# Patient Record
Sex: Female | Born: 1990 | Race: Black or African American | Hispanic: No | Marital: Single | Smoking: Never smoker
Health system: Southern US, Community
[De-identification: ages and names within clinical notes are randomized; demographics above are authoritative.]

## PROBLEM LIST (undated history)

## (undated) ENCOUNTER — Emergency Department (HOSPITAL_COMMUNITY): Payer: BLUE CROSS/BLUE SHIELD

## (undated) DIAGNOSIS — G43909 Migraine, unspecified, not intractable, without status migrainosus: Secondary | ICD-10-CM

## (undated) DIAGNOSIS — Z789 Other specified health status: Secondary | ICD-10-CM

## (undated) HISTORY — PX: NO PAST SURGERIES: SHX2092

## (undated) HISTORY — PX: LIPOSUCTION MULTIPLE BODY PARTS: SUR832

---
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
## 2012-08-25 ENCOUNTER — Emergency Department: Payer: Self-pay | Admitting: Emergency Medicine

## 2012-08-25 LAB — COMPREHENSIVE METABOLIC PANEL
Albumin: 4 g/dL (ref 3.4–5.0)
Alkaline Phosphatase: 75 U/L (ref 50–136)
Anion Gap: 7 (ref 7–16)
BUN: 10 mg/dL (ref 7–18)
Bilirubin,Total: 0.4 mg/dL (ref 0.2–1.0)
Calcium, Total: 9 mg/dL (ref 8.5–10.1)
Chloride: 108 mmol/L — ABNORMAL HIGH (ref 98–107)
Co2: 25 mmol/L (ref 21–32)
Creatinine: 0.8 mg/dL (ref 0.60–1.30)
EGFR (African American): >60
EGFR (Non-African Amer.): >60
Glucose: 85 mg/dL (ref 65–99)
Osmolality: 278 (ref 275–301)
Potassium: 3.4 mmol/L — ABNORMAL LOW (ref 3.5–5.1)
SGOT(AST): 19 U/L (ref 15–37)
SGPT (ALT): 16 U/L (ref 12–78)
Sodium: 140 mmol/L (ref 136–145)
Total Protein: 8 g/dL (ref 6.4–8.2)

## 2012-08-25 LAB — URINALYSIS, COMPLETE
Bacteria: NONE SEEN
Bilirubin,UR: NEGATIVE
Glucose,UR: NEGATIVE mg/dL (ref 0–75)
Ketone: NEGATIVE
Leukocyte Esterase: NEGATIVE
Nitrite: NEGATIVE
Ph: 6 (ref 4.5–8.0)
Protein: NEGATIVE
RBC,UR: 129 /HPF (ref 0–5)
Specific Gravity: 1.013 (ref 1.003–1.030)
Squamous Epithelial: NONE SEEN
WBC UR: 3 /HPF (ref 0–5)

## 2012-08-25 LAB — CBC
HCT: 37.6 % (ref 35.0–47.0)
HGB: 12.5 g/dL (ref 12.0–16.0)
MCH: 31.6 pg (ref 26.0–34.0)
MCHC: 33.4 g/dL (ref 32.0–36.0)
MCV: 95 fL (ref 80–100)
Platelet: 363 10*3/uL (ref 150–440)
RBC: 3.97 10*6/uL (ref 3.80–5.20)
RDW: 13.1 % (ref 11.5–14.5)
WBC: 6.1 10*3/uL (ref 3.6–11.0)

## 2012-08-25 LAB — LIPASE, BLOOD: Lipase: 128 U/L (ref 73–393)

## 2012-08-26 LAB — WET PREP, GENITAL

## 2012-09-23 ENCOUNTER — Emergency Department: Payer: Self-pay | Admitting: Emergency Medicine

## 2013-01-01 ENCOUNTER — Emergency Department: Payer: Self-pay | Admitting: Emergency Medicine

## 2013-04-08 ENCOUNTER — Emergency Department: Payer: Self-pay | Admitting: Internal Medicine

## 2013-05-11 ENCOUNTER — Emergency Department: Payer: Self-pay | Admitting: Internal Medicine

## 2013-05-11 LAB — URINALYSIS, COMPLETE
Bacteria: NONE SEEN
Bilirubin,UR: NEGATIVE
Glucose,UR: NEGATIVE mg/dL (ref 0–75)
Ketone: NEGATIVE
Nitrite: NEGATIVE
Ph: 6 (ref 4.5–8.0)
Protein: NEGATIVE
RBC,UR: 6 /HPF (ref 0–5)
Specific Gravity: 1.015 (ref 1.003–1.030)
Squamous Epithelial: 5
WBC UR: 21 /HPF (ref 0–5)

## 2013-05-11 LAB — COMPREHENSIVE METABOLIC PANEL
Albumin: 3.9 g/dL (ref 3.4–5.0)
Alkaline Phosphatase: 59 U/L (ref 50–136)
Anion Gap: 3 — ABNORMAL LOW (ref 7–16)
BUN: 8 mg/dL (ref 7–18)
Bilirubin,Total: 0.7 mg/dL (ref 0.2–1.0)
Calcium, Total: 8.8 mg/dL (ref 8.5–10.1)
Chloride: 111 mmol/L — ABNORMAL HIGH (ref 98–107)
Co2: 25 mmol/L (ref 21–32)
Creatinine: 0.69 mg/dL (ref 0.60–1.30)
EGFR (African American): >60
EGFR (Non-African Amer.): >60
Glucose: 89 mg/dL (ref 65–99)
Osmolality: 275 (ref 275–301)
Potassium: 3.9 mmol/L (ref 3.5–5.1)
SGOT(AST): 24 U/L (ref 15–37)
SGPT (ALT): 16 U/L (ref 12–78)
Sodium: 139 mmol/L (ref 136–145)
Total Protein: 7.5 g/dL (ref 6.4–8.2)

## 2013-05-11 LAB — CBC
HCT: 37.3 % (ref 35.0–47.0)
HGB: 13.1 g/dL (ref 12.0–16.0)
MCH: 32.2 pg (ref 26.0–34.0)
MCHC: 35.2 g/dL (ref 32.0–36.0)
MCV: 92 fL (ref 80–100)
Platelet: 311 10*3/uL (ref 150–440)
RBC: 4.08 10*6/uL (ref 3.80–5.20)
RDW: 12.5 % (ref 11.5–14.5)
WBC: 4.8 10*3/uL (ref 3.6–11.0)

## 2013-05-11 LAB — LIPASE, BLOOD: Lipase: 113 U/L (ref 73–393)

## 2013-05-19 ENCOUNTER — Emergency Department: Payer: Self-pay | Admitting: Internal Medicine

## 2013-08-14 ENCOUNTER — Encounter: Payer: Self-pay | Admitting: *Deleted

## 2013-08-29 ENCOUNTER — Ambulatory Visit (INDEPENDENT_AMBULATORY_CARE_PROVIDER_SITE_OTHER): Payer: BC Managed Care – PPO | Admitting: General Surgery

## 2013-08-29 ENCOUNTER — Encounter: Payer: Self-pay | Admitting: General Surgery

## 2013-08-29 ENCOUNTER — Other Ambulatory Visit: Payer: BC Managed Care – PPO

## 2013-08-29 VITALS — BP 94/58 | HR 72 | Resp 12 | Ht 60.0 in | Wt 114.0 lb

## 2013-08-29 DIAGNOSIS — N63 Unspecified lump in unspecified breast: Secondary | ICD-10-CM | POA: Insufficient documentation

## 2013-08-29 NOTE — Progress Notes (Signed)
Patient ID: Sabrina Brennan, female   DOB: 02/07/1991, 23 y.o.   MRN: [id number]  Chief Complaint  Patient presents with  . Other    left breast lump    HPI Sabrina Routeyasia A Danser is a 23 y.o. female here today for an evaluation of an left breast lump refer by Lyndel PleasureErica Howard NP. Denies family history of breast cancer. Her companion noted a lump about 2-3 weeks ago, states it does seem to be larger and more tender.  Wearing a bra more to help support the left breast. (She normally does not wear her bra).  No nipple discharge. No history of trauma.  HPI  History reviewed. No pertinent past medical history.  History reviewed. No pertinent past surgical history.  No family history on file.  Social History History  Substance Use Topics  . Smoking status: Never Smoker   . Smokeless tobacco: Never Used  . Alcohol Use: 1.0 oz/week    2 drink(s) per week    No Known Allergies  Current Outpatient Prescriptions  Medication Sig Dispense Refill  . medroxyPROGESTERone (DEPO-PROVERA) 150 MG/ML injection Inject 150 mg into the muscle every 3 (three) months.       No current facility-administered medications for this visit.    Review of Systems Review of Systems  Constitutional: Negative.   Respiratory: Negative.   Cardiovascular: Negative.     Blood pressure 94/58, pulse 72, resp. rate 12, height 5' (1.524 m), weight 114 lb (51.71 kg), last menstrual period 04/18/2013.  Physical Exam Physical Exam  Constitutional: She is oriented to person, place, and time. She appears well-developed and well-nourished.  Neck: Neck supple.  Cardiovascular: Normal rate, regular rhythm and normal heart sounds.   Pulmonary/Chest: Effort normal and breath sounds normal. Right breast exhibits tenderness (upper outer quadrant). Right breast exhibits no inverted nipple, no mass, no nipple discharge and no skin change. Left breast exhibits tenderness (upper outer quadrant). Left breast exhibits no inverted nipple, no  mass, no nipple discharge and no skin change.    Lymphadenopathy:    She has no cervical adenopathy.    She has no axillary adenopathy.  Neurological: She is alert and oriented to person, place, and time.  Skin: Skin is warm and dry.    Data Reviewed Ultrasound examination of the left breast was undertaken from the 12 to 3:00 position. A uniform echo pattern was noted throughout the breast, With no evidence of architectural distortion, cystic or solid lesions in the area of patient concern or adjacent tissue.  Assessment    Benign breast exam, negative ultrasound.     Plan    The patient was encouraged to make use of OTC medications for comfort as needed. She is is consuming a large volume of caffeine-containing beverages, she may desire to moderate this to see if it improves her breast discomfort. We'll plan for reassessment in 3 months, earlier if her symptoms worsen.  Tylenol/Advil as needed for comfort. Monitor amount of caffeine intake.       Earline MayotteByrnett, Serita Degroote W 08/29/2013, 7:59 PM

## 2013-08-29 NOTE — Patient Instructions (Addendum)
Continue self breast exams. Call office for any new breast issues or concerns. Tylenol/Advil as needed for comfort. Monitor amount of caffeine intake.  Breast Self-Awareness Breast self-awareness allows you to notice a breast problem early while it is still small. Do a breast self-exam:  Every month, 5 7 days after your period (menstrual period).  At the same time each month if you do not have periods anymore. Look for any:  Difference between your breasts (size, shape, or position).  Change in breast shape or size.  Fluid or blood coming from your nipples.  Changes in your nipples (dimpling, nipple movement).   Change in skin color or texture (redness, scaly areas). Feel for:  Lumps.  Bumps.  Dips.  Any other changes. HOW TO DO A BREAST SELF-EXAM Look at your breasts and nipples. 1. Take off all your clothes above your waist. 2. Stand in front of a mirror in a room with good lighting. 3. Put your hands on your hips and push your hands downward. Feel your breasts.  1. Lie flat on your back or stand in the shower or tub. If you are in the shower or tub, have wet, soapy hands. 2. Place your right arm above your head. 3. Place your left hand in the right underarm area. 4. Make small circles using the pads (not the fingertips) of your 3 middle fingers. Press lightly and then with medium and firm pressure. 5. Move your fingers a little lower and make the small circles at the 3 pressures (light, medium, and firm). 6. Continue moving your fingers lower and making circles until you reach the bottom of your breast. 7. Move your fingers one finger-width towards the center of the body. 8. Continue making the circles, this time moving upward until you reach the bottom of your neck. 9. Move your fingers one finger-width towards the center of your body. 10. Make circles downward when starting at the bottom of the neck. Make circles upward when starting at the bottom of the breast. Stop  when you reach the middle of the chest. 11.  Repeat these steps on the other breast. Write down what looks and feels normal for each breast. Also write down any changes you notice. GET HELP RIGHT AWAY IF:  You see any changes in your breasts or nipples.  You see skin changes.  You have unusual discharge from your nipples.  You feel a new lump.  You feel unusually thick areas. Document Released: 01/05/2008 Document Revised: 07/05/2012 Document Reviewed: 11/03/2011 [facility] Patient Information 2014 [employer], [place].

## 2013-11-08 ENCOUNTER — Emergency Department: Payer: Self-pay | Admitting: Emergency Medicine

## 2013-11-11 LAB — BETA STREP CULTURE(ARMC)

## 2013-11-26 ENCOUNTER — Ambulatory Visit: Payer: BC Managed Care – PPO | Admitting: General Surgery

## 2013-12-18 ENCOUNTER — Encounter: Payer: Self-pay | Admitting: *Deleted

## 2014-01-19 ENCOUNTER — Emergency Department: Payer: Self-pay | Admitting: Emergency Medicine

## 2014-01-19 LAB — CBC WITH DIFFERENTIAL/PLATELET
Basophil #: 0 10*3/uL (ref 0.0–0.1)
Basophil %: 0.5 %
Eosinophil #: 0.1 10*3/uL (ref 0.0–0.7)
Eosinophil %: 2.2 %
HCT: 39 % (ref 35.0–47.0)
HGB: 13.3 g/dL (ref 12.0–16.0)
Lymphocyte #: 2.2 10*3/uL (ref 1.0–3.6)
Lymphocyte %: 35.1 %
MCH: 32.1 pg (ref 26.0–34.0)
MCHC: 34 g/dL (ref 32.0–36.0)
MCV: 94 fL (ref 80–100)
Monocyte #: 0.4 x10 3/mm (ref 0.2–0.9)
Monocyte %: 7.1 %
Neutrophil #: 3.5 10*3/uL (ref 1.4–6.5)
Neutrophil %: 55.1 %
Platelet: 345 10*3/uL (ref 150–440)
RBC: 4.13 10*6/uL (ref 3.80–5.20)
RDW: 12.6 % (ref 11.5–14.5)
WBC: 6.3 10*3/uL (ref 3.6–11.0)

## 2014-01-19 LAB — URINALYSIS, COMPLETE
Bacteria: NONE SEEN
Bilirubin,UR: NEGATIVE
Glucose,UR: NEGATIVE mg/dL (ref 0–75)
Ketone: NEGATIVE
Nitrite: NEGATIVE
Ph: 7 (ref 4.5–8.0)
Protein: NEGATIVE
RBC,UR: 33 /HPF (ref 0–5)
Specific Gravity: 1.021 (ref 1.003–1.030)
Squamous Epithelial: NONE SEEN
WBC UR: 39 /HPF (ref 0–5)

## 2014-01-19 LAB — BASIC METABOLIC PANEL
Anion Gap: 8 (ref 7–16)
BUN: 11 mg/dL (ref 7–18)
Calcium, Total: 9.1 mg/dL (ref 8.5–10.1)
Chloride: 109 mmol/L — ABNORMAL HIGH (ref 98–107)
Co2: 23 mmol/L (ref 21–32)
Creatinine: 0.87 mg/dL (ref 0.60–1.30)
EGFR (African American): >60
EGFR (Non-African Amer.): >60
Glucose: 110 mg/dL — ABNORMAL HIGH (ref 65–99)
Osmolality: 279 (ref 275–301)
Potassium: 3.6 mmol/L (ref 3.5–5.1)
Sodium: 140 mmol/L (ref 136–145)

## 2014-03-10 ENCOUNTER — Emergency Department: Payer: Self-pay | Admitting: Emergency Medicine

## 2014-06-03 ENCOUNTER — Encounter: Payer: Self-pay | Admitting: General Surgery

## 2014-06-23 ENCOUNTER — Encounter (HOSPITAL_COMMUNITY): Payer: Self-pay | Admitting: Emergency Medicine

## 2014-06-23 ENCOUNTER — Emergency Department (HOSPITAL_COMMUNITY)
Admission: EM | Admit: 2014-06-23 | Discharge: 2014-06-23 | Disposition: A | Discharge status: Home / Self Care | Payer: 59 | Attending: Emergency Medicine | Admitting: Emergency Medicine

## 2014-06-23 DIAGNOSIS — R509 Fever, unspecified: Secondary | ICD-10-CM | POA: Insufficient documentation

## 2014-06-23 DIAGNOSIS — R0989 Other specified symptoms and signs involving the circulatory and respiratory systems: Secondary | ICD-10-CM | POA: Diagnosis not present

## 2014-06-23 DIAGNOSIS — R05 Cough: Secondary | ICD-10-CM | POA: Diagnosis present

## 2014-06-23 DIAGNOSIS — R51 Headache: Secondary | ICD-10-CM | POA: Insufficient documentation

## 2014-06-23 DIAGNOSIS — R1110 Vomiting, unspecified: Secondary | ICD-10-CM | POA: Insufficient documentation

## 2014-06-23 DIAGNOSIS — R6889 Other general symptoms and signs: Secondary | ICD-10-CM

## 2014-06-23 MED ORDER — DEXTROMETHORPHAN-GUAIFENESIN 10-100 MG/5ML PO LIQD
5.0000 mL | Freq: Four times a day (QID) | ORAL | Status: DC | PRN
Start: 2014-06-23 — End: 2015-05-08

## 2014-06-23 MED ORDER — PROMETHAZINE HCL 25 MG PO TABS
25.0000 mg | ORAL_TABLET | Freq: Four times a day (QID) | ORAL | Status: DC | PRN
Start: 2014-06-23 — End: 2015-05-08

## 2014-06-23 MED ORDER — SALINE SPRAY 0.65 % NA SOLN: 1.0000 | NASAL | Status: DC | PRN

## 2014-06-23 NOTE — ED Provider Notes (Signed)
CSN: [id number]     Arrival date & time 06/23/14  2012 History  This chart was scribed for Junius FinnerErin O'Malley, PA-C, working with Elwin MochaBlair Walden, MD by Chestine SporeSoijett Blue, ED Scribe. The patient was seen in room TR07C/TR07C at 9:06 PM.    Chief Complaint  Patient presents with  . Cough    The history is provided by the patient. No language interpreter was used.   HPI Comments: Sabrina Brennan is a 23 y.o. female who presents to the Emergency Department complaining of dry cough onset 1 week. She states that she is having associated symptoms of HA, chest congestion, fever, chills, vomiting x 2. She states that she has tried Tylenol with no relief for her symptoms. She denies nausea and any other symptoms. Denies any sick contact. Denies medical hx of asthma. Denies recent travel. Denies being allergic to any medications.   History reviewed. No pertinent past medical history. History reviewed. No pertinent past surgical history. No family history on file. History  Substance Use Topics  . Smoking status: Never Smoker   . Smokeless tobacco: Never Used  . Alcohol Use: 1.0 oz/week    2 drink(s) per week   OB History    Gravida Para Term Preterm AB TAB SAB Ectopic Multiple Living   0               Obstetric Comments   1st Menstrual Cycle:  12     Review of Systems  Constitutional: Positive for fever and chills.  Respiratory: Positive for cough.   Cardiovascular:       Chest congestion  Gastrointestinal: Positive for vomiting. Negative for nausea.  Neurological: Positive for headaches.      Allergies  Review of patient's allergies indicates no known allergies.  Home Medications   Prior to Admission medications   Medication Sig Start Date End Date Taking? Authorizing Provider  dextromethorphan-guaiFENesin (TUSSIN DM) 10-100 MG/5ML liquid Take 5 mLs by mouth every 6 (six) hours as needed for cough. 06/23/14   Junius FinnerErin O'Malley, PA-C  medroxyPROGESTERone (DEPO-PROVERA) 150 MG/ML injection Inject  150 mg into the muscle every 3 (three) months.    Historical Provider, MD  promethazine (PHENERGAN) 25 MG tablet Take 1 tablet (25 mg total) by mouth every 6 (six) hours as needed for nausea or vomiting. 06/23/14   Junius FinnerErin O'Malley, PA-C  sodium chloride (OCEAN) 0.65 % SOLN nasal spray Place 1 spray into both nostrils as needed for congestion. 06/23/14   Junius FinnerErin O'Malley, PA-C   BP 110/72 mmHg  Pulse 81  Temp(Src) 97.8 F (36.6 C)  Resp 18  Ht 4\' 11"  (1.499 m)  Wt 115 lb (52.164 kg)  BMI 23.21 kg/m2  SpO2 100% Physical Exam  Constitutional: She is oriented to person, place, and time. She appears well-developed and well-nourished. No distress.  HENT:  Head: Normocephalic and atraumatic.  Right Ear: Tympanic membrane, external ear and ear canal normal.  Left Ear: Tympanic membrane, external ear and ear canal normal.  Nose: Mucosal edema present.  Mouth/Throat: Posterior oropharyngeal erythema present. No oropharyngeal exudate or posterior oropharyngeal edema.  Eyes: EOM are normal.  Neck: Neck supple. No tracheal deviation present.  Cardiovascular: Normal rate.   Pulmonary/Chest: Effort normal and breath sounds normal. No respiratory distress.  Musculoskeletal: Normal range of motion.  Neurological: She is alert and oriented to person, place, and time.  Skin: Skin is warm and dry.  Psychiatric: She has a normal mood and affect. Her behavior is normal.  Nursing note and  vitals reviewed.   ED Course  Procedures (including critical care time) DIAGNOSTIC STUDIES: Oxygen Saturation is 100% on room air, normal by my interpretation.    COORDINATION OF CARE: 9:09 PM-Discussed treatment plan which includes nasal saline, Tussin-DM, and Phenergan with pt at bedside and pt agreed to plan.   Labs Review Labs Reviewed - No data to display  Imaging Review No results found.   EKG Interpretation None      MDM   Final diagnoses:  Flu-like symptoms    Pt presenting to ED with flu-like  symptoms. Non-toxic appearing. Afebrile. Lungs: CTAB, no respiratory distress. No CXR indicated at this time. Will tx conservatively. Rx: ocean saline nasal spray, phenergan, tussin DM. Advised pt to use acetaminophen and ibuprofen as needed for fever and pain. Encouraged rest and fluids. Work note for 3 days provided. Advised to f/u with PCP in 3-4 days if not improving. Return precautions provided. Pt verbalized understanding and agreement with tx plan.   I personally performed the services described in this documentation, which was scribed in my presence. The recorded information has been reviewed and is accurate.    Junius Finnerrin O'Malley, PA-C 06/23/14 2215  Elwin MochaBlair Walden, MD 06/23/14 [phone]

## 2014-06-23 NOTE — ED Notes (Signed)
Pt. reports persistent dry cough , chills, headache , chest congestion with low grade fever onset this week .

## 2014-08-02 ENCOUNTER — Emergency Department (HOSPITAL_COMMUNITY)
Admission: EM | Admit: 2014-08-02 | Discharge: 2014-08-02 | Disposition: A | Discharge status: Home / Self Care | Payer: 59 | Attending: Emergency Medicine | Admitting: Emergency Medicine

## 2014-08-02 ENCOUNTER — Encounter (HOSPITAL_COMMUNITY): Payer: Self-pay | Admitting: Emergency Medicine

## 2014-08-02 DIAGNOSIS — J069 Acute upper respiratory infection, unspecified: Secondary | ICD-10-CM

## 2014-08-02 DIAGNOSIS — R51 Headache: Secondary | ICD-10-CM | POA: Insufficient documentation

## 2014-08-02 MED ORDER — DEXTROMETHORPHAN POLISTIREX 30 MG/5ML PO LQCR: 30.0000 mg | ORAL | Status: DC | PRN

## 2014-08-02 MED ORDER — LORATADINE-PSEUDOEPHEDRINE ER 5-120 MG PO TB12: 1.0000 | ORAL_TABLET | Freq: Two times a day (BID) | ORAL | Status: DC
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
## 2014-08-02 NOTE — Discharge Instructions (Signed)
Take Claritin for congestion. Take delsym as needed for cough. Refer to attached documents for more information. Return to the ED with worsening or concerning symptoms.
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
## 2014-11-02 ENCOUNTER — Encounter (HOSPITAL_COMMUNITY): Payer: Self-pay | Admitting: Nurse Practitioner

## 2014-11-02 ENCOUNTER — Emergency Department (HOSPITAL_COMMUNITY)
Admission: EM | Admit: 2014-11-02 | Discharge: 2014-11-02 | Disposition: A | Discharge status: Home / Self Care | Payer: 59 | Attending: Emergency Medicine | Admitting: Emergency Medicine

## 2014-11-02 DIAGNOSIS — Z79899 Other long term (current) drug therapy: Secondary | ICD-10-CM | POA: Insufficient documentation

## 2014-11-02 DIAGNOSIS — R630 Anorexia: Secondary | ICD-10-CM | POA: Insufficient documentation

## 2014-11-02 DIAGNOSIS — M791 Myalgia: Secondary | ICD-10-CM | POA: Insufficient documentation

## 2014-11-02 DIAGNOSIS — J069 Acute upper respiratory infection, unspecified: Secondary | ICD-10-CM

## 2014-11-02 DIAGNOSIS — H53149 Visual discomfort, unspecified: Secondary | ICD-10-CM | POA: Insufficient documentation

## 2014-11-02 MED ORDER — KETOROLAC TROMETHAMINE 60 MG/2ML IM SOLN: 60.0000 mg | Freq: Once | INTRAMUSCULAR | Status: DC

## 2014-11-02 MED ORDER — KETOROLAC TROMETHAMINE 60 MG/2ML IM SOLN
60.0000 mg | Freq: Once | INTRAMUSCULAR | Status: AC
Start: 2014-11-02 — End: 2014-11-02
  Administered 2014-11-02: 60 mg via INTRAMUSCULAR
  Filled 2014-11-02: qty 2

## 2014-11-02 MED ORDER — IBUPROFEN 400 MG PO TABS: 800.0000 mg | ORAL_TABLET | Freq: Once | ORAL | Status: DC

## 2014-11-02 MED ORDER — IBUPROFEN 800 MG PO TABS: 800.0000 mg | ORAL_TABLET | Freq: Three times a day (TID) | ORAL | Status: DC | PRN

## 2014-11-02 MED ORDER — BENZONATATE 100 MG PO CAPS: 100.0000 mg | ORAL_CAPSULE | Freq: Three times a day (TID) | ORAL | Status: DC | PRN

## 2014-11-02 NOTE — ED Notes (Signed)
Pt reports L sided facial pain, congestion, headache and poor appetite this week.  She states "it feels like i have a cold." she took tylenol with no relief

## 2014-11-02 NOTE — ED Provider Notes (Signed)
CSN: [id number]     Arrival date & time 11/02/14  1127 History   This chart is scribed for non-physician practitioner, Trixie Dredge, PA-C, working with Mirian Mo, MD by Abel Presto, ED Scribe.  This patient was seen in room TR05C/TR05C and the patient's care was started 11:52 AM.     Chief Complaint  Patient presents with  . URI    Patient is a 24 y.o. female presenting with URI. The history is provided by the patient. No language interpreter was used.  URI Presenting symptoms: congestion and cough   Presenting symptoms: no fever and no sore throat   Associated symptoms: headaches and myalgias    HPI Comments: Sabrina Brennan is a 24 y.o. female who presents to the Emergency Department complaining of congestion and dry cough with onset 6 days ago. Pt notes associated loss of appetite, headache, left sided facial pain, photophobia, eye pain with touch, and chills. Pt has taken Tylenol with no relief. Pt reports difficulty sleeping. Pt with h/o injury to left side of head. Pt denies fever, sore throat, trouble swallowing, chest pain, SOB, abdominal pain, vomiting, nausea, diarrhea, and vaginal discharge.   History reviewed. No pertinent past medical history. History reviewed. No pertinent past surgical history. History reviewed. No pertinent family history. History  Substance Use Topics  . Smoking status: Never Smoker   . Smokeless tobacco: Never Used  . Alcohol Use: 1.0 oz/week    2 drink(s) per week   OB History    Gravida Para Term Preterm AB TAB SAB Ectopic Multiple Living   0               Obstetric Comments   1st Menstrual Cycle:  12     Review of Systems  Constitutional: Positive for chills and appetite change. Negative for fever.  HENT: Positive for congestion. Negative for sore throat and trouble swallowing.   Eyes: Positive for photophobia and pain.  Respiratory: Positive for cough. Negative for shortness of breath.   Gastrointestinal: Negative for nausea,  vomiting, abdominal pain and diarrhea.  Genitourinary: Negative for vaginal discharge.  Musculoskeletal: Positive for myalgias.  Neurological: Positive for headaches.  All other systems reviewed and are negative.     Allergies  Review of patient's allergies indicates no known allergies.  Home Medications   Prior to Admission medications   Medication Sig Start Date End Date Taking? Authorizing Provider  dextromethorphan (DELSYM) 30 MG/5ML liquid Take 5 mLs (30 mg total) by mouth as needed for cough. 08/02/14   Kaitlyn Szekalski, PA-C  dextromethorphan-guaiFENesin (TUSSIN DM) 10-100 MG/5ML liquid Take 5 mLs by mouth every 6 (six) hours as needed for cough. 06/23/14   Junius Finner, PA-C  loratadine-pseudoephedrine (CLARITIN-D 12 HOUR) 5-120 MG per tablet Take 1 tablet by mouth 2 (two) times daily. 08/02/14   Kaitlyn Szekalski, PA-C  medroxyPROGESTERone (DEPO-PROVERA) 150 MG/ML injection Inject 150 mg into the muscle every 3 (three) months.    Historical Provider, MD  promethazine (PHENERGAN) 25 MG tablet Take 1 tablet (25 mg total) by mouth every 6 (six) hours as needed for nausea or vomiting. 06/23/14   Junius Finner, PA-C  sodium chloride (OCEAN) 0.65 % SOLN nasal spray Place 1 spray into both nostrils as needed for congestion. 06/23/14   Junius Finner, PA-C   BP 102/71 mmHg  Pulse 70  Temp(Src) 98 F (36.7 C) (Oral)  Resp 16  Ht  (1.499 m)  Wt 115 lb (52.164 kg)  BMI 23.21 kg/m2  SpO2  100% Physical Exam  Constitutional: She appears well-developed and well-nourished. No distress.  HENT:  Head: Atraumatic. Macrocephalic.  Nose: No mucosal edema. Right sinus exhibits no maxillary sinus tenderness and no frontal sinus tenderness. Left sinus exhibits maxillary sinus tenderness and frontal sinus tenderness.  Mouth/Throat: Posterior oropharyngeal erythema present. No oropharyngeal exudate or posterior oropharyngeal edema.  Eyes: Conjunctivae and EOM are normal. Right eye exhibits no  discharge. Left eye exhibits no discharge.  Neck: Normal range of motion. Neck supple. Normal range of motion (full active) present.  Cardiovascular: Normal rate and regular rhythm.   Pulmonary/Chest: Effort normal and breath sounds normal. No stridor. No respiratory distress. She has no wheezes. She has no rales.  Lymphadenopathy:    She has no cervical adenopathy.  Neurological: She is alert.  Skin: She is not diaphoretic.  Nursing note and vitals reviewed.   ED Course  Procedures (including critical care time) DIAGNOSTIC STUDIES: Oxygen Saturation is 100% on room air, normal by my interpretation.    COORDINATION OF CARE: 11:55 AM Discussed treatment plan with patient at beside, the patient agrees with the plan and has no further questions at this time.   Labs Review Labs Reviewed - No data to display  Imaging Review No results found.   EKG Interpretation None      MDM   Final diagnoses:  URI (upper respiratory infection)    Afebrile, nontoxic patient with constellation of symptoms suggestive of viral syndrome.  No concerning findings on exam.  No meningeal signs.  Discharged home with supportive care, PCP follow up.  Discussed result, findings, treatment, and follow up  with patient.  Pt given return precautions.  Pt verbalizes understanding and agrees with plan.      I personally performed the services described in this documentation, which was scribed in my presence. The recorded information has been reviewed and is accurate.    Trixie Dredgemily Treyvonne Tata, PA-C 11/02/14 1243  Mirian MoMatthew Gentry, MD 11/05/14 [phone]

## 2014-11-02 NOTE — Discharge Instructions (Signed)
Read the information below.  Use the prescribed medication as directed.  Please discuss all new medications with your pharmacist.  You may return to the Emergency Department at any time for worsening condition or any new symptoms that concern you.  If you develop high fevers that do not resolve with tylenol or ibuprofen, you have difficulty swallowing or breathing, or you are unable to tolerate fluids by mouth, return to the ER for a recheck.   ° ° °Upper Respiratory Infection, Adult °An upper respiratory infection (URI) is also known as the common cold. It is often caused by a type of germ (virus). Colds are easily spread (contagious). You can pass it to others by kissing, coughing, sneezing, or drinking out of the same glass. Usually, you get better in 1 or 2 weeks.  °HOME CARE  °· Only take medicine as told by your doctor. °· Use a warm mist humidifier or breathe in steam from a hot shower. °· Drink enough water and fluids to keep your pee (urine) clear or pale yellow. °· Get plenty of rest. °· Return to work when your temperature is back to normal or as told by your doctor. You may use a face mask and wash your hands to stop your cold from spreading. °GET HELP RIGHT AWAY IF:  °· After the first few days, you feel you are getting worse. °· You have questions about your medicine. °· You have chills, shortness of breath, or brown or red spit (mucus). °· You have yellow or brown snot (nasal discharge) or pain in the face, especially when you bend forward. °· You have a fever, puffy (swollen) neck, pain when you swallow, or white spots in the back of your throat. °· You have a bad headache, ear pain, sinus pain, or chest pain. °· You have a high-pitched whistling sound when you breathe in and out (wheezing). °· You have a lasting cough or cough up blood. °· You have sore muscles or a stiff neck. °MAKE SURE YOU:  °· Understand these instructions. °· Will watch your condition. °· Will get help right away if you are not  doing well or get worse. °Document Released: 01/05/2008 Document Revised: 10/11/2011 Document Reviewed: 10/24/2013 °ExitCare® Patient Information ©2015 [employer]. This information is not intended to replace advice given to you by your health care provider. Make sure you discuss any questions you have with your health care provider. ° °

## 2014-12-09 ENCOUNTER — Encounter (HOSPITAL_COMMUNITY): Payer: Self-pay | Admitting: Emergency Medicine

## 2014-12-09 ENCOUNTER — Emergency Department (HOSPITAL_COMMUNITY)
Admission: EM | Admit: 2014-12-09 | Discharge: 2014-12-09 | Disposition: A | Discharge status: Home / Self Care | Payer: 59 | Attending: Emergency Medicine | Admitting: Emergency Medicine

## 2014-12-09 ENCOUNTER — Emergency Department (HOSPITAL_COMMUNITY): Payer: 59

## 2014-12-09 DIAGNOSIS — R079 Chest pain, unspecified: Secondary | ICD-10-CM

## 2014-12-09 DIAGNOSIS — R200 Anesthesia of skin: Secondary | ICD-10-CM | POA: Insufficient documentation

## 2014-12-09 DIAGNOSIS — Z79899 Other long term (current) drug therapy: Secondary | ICD-10-CM | POA: Insufficient documentation

## 2014-12-09 LAB — CBC WITH DIFFERENTIAL/PLATELET
Basophils Absolute: 0 10*3/uL (ref 0.0–0.1)
Basophils Relative: 0 % (ref 0–1)
Eosinophils Absolute: 0.1 10*3/uL (ref 0.0–0.7)
Eosinophils Relative: 2 % (ref 0–5)
HCT: 38.4 % (ref 36.0–46.0)
Hemoglobin: 13 g/dL (ref 12.0–15.0)
Lymphocytes Relative: 47 % — ABNORMAL HIGH (ref 12–46)
Lymphs Abs: 3.5 10*3/uL (ref 0.7–4.0)
MCH: 31.1 pg (ref 26.0–34.0)
MCHC: 33.9 g/dL (ref 30.0–36.0)
MCV: 91.9 fL (ref 78.0–100.0)
Monocytes Absolute: 0.4 10*3/uL (ref 0.1–1.0)
Monocytes Relative: 5 % (ref 3–12)
Neutro Abs: 3.4 10*3/uL (ref 1.7–7.7)
Neutrophils Relative %: 46 % (ref 43–77)
Platelets: 326 10*3/uL (ref 150–400)
RBC: 4.18 MIL/uL (ref 3.87–5.11)
RDW: 12.4 % (ref 11.5–15.5)
WBC: 7.4 10*3/uL (ref 4.0–10.5)

## 2014-12-09 LAB — I-STAT CHEM 8, ED
BUN: 12 mg/dL (ref 6–20)
Calcium, Ion: 1.23 mmol/L (ref 1.12–1.23)
Chloride: 106 mmol/L (ref 101–111)
Creatinine, Ser: 0.8 mg/dL (ref 0.44–1.00)
Glucose, Bld: 90 mg/dL (ref 70–99)
HCT: 42 % (ref 36.0–46.0)
Hemoglobin: 14.3 g/dL (ref 12.0–15.0)
Potassium: 3.4 mmol/L — ABNORMAL LOW (ref 3.5–5.1)
Sodium: 142 mmol/L (ref 135–145)
TCO2: 19 mmol/L (ref 0–100)

## 2014-12-09 MED ORDER — POTASSIUM CHLORIDE CRYS ER 20 MEQ PO TBCR
60.0000 meq | EXTENDED_RELEASE_TABLET | Freq: Once | ORAL | Status: AC
  Administered 2014-12-09: 60 meq via ORAL
  Filled 2014-12-09: qty 3

## 2014-12-09 MED ORDER — HYDROCODONE-ACETAMINOPHEN 5-325 MG PO TABS
1.0000 | ORAL_TABLET | Freq: Once | ORAL | Status: AC
  Administered 2014-12-09: 1 via ORAL
  Filled 2014-12-09: qty 1

## 2014-12-09 MED ORDER — IBUPROFEN 800 MG PO TABS
800.0000 mg | ORAL_TABLET | Freq: Once | ORAL | Status: AC
  Administered 2014-12-09: 800 mg via ORAL
  Filled 2014-12-09: qty 1

## 2014-12-09 NOTE — ED Provider Notes (Signed)
CSN: [id number]     Arrival date & time 12/09/14  [id number] History   First MD Initiated Contact with Patient 12/09/14 0336    This chart was scribed for Tomasita CrumbleAdeleke Aiesha Leland, MD by Marica OtterNusrat Rahman, ED Scribe. This patient was seen in room A01C/A01C and the patient's care was started at 3:39 AM.  Chief Complaint  Patient presents with  . Chest Pain  . Numbness   The history is provided by the patient. No language interpreter was used.   PCP: No PCP per Pt HPI Comments: Sabrina Brennan is a 24 y.o. female who presents to the Emergency Department complaining of atraumatic radiating left elbow pain and tingling radiating to the fingers with associated intermittent, left sided, aching chest pain onset three weeks ago. Pt reports using Motrin at home with some relief. Pt also notes cold like Sx recently. Pt denies any recent surgeries, BC pills (pt notes her last depo shot was 1 month ago), hormone pills, Hx of blood clots, vomiting, diaphoresis.   History reviewed. No pertinent past medical history. History reviewed. No pertinent past surgical history. History reviewed. No pertinent family history. History  Substance Use Topics  . Smoking status: Never Smoker   . Smokeless tobacco: Never Used  . Alcohol Use: 1.0 oz/week    2 drink(s) per week   OB History    Gravida Para Term Preterm AB TAB SAB Ectopic Multiple Living   0               Obstetric Comments   1st Menstrual Cycle:  12     Review of Systems  10 Systems reviewed and all are negative for acute change except as noted in the HPI.  Allergies  Review of patient's allergies indicates no known allergies.  Home Medications   Prior to Admission medications   Medication Sig Start Date End Date Taking? Authorizing Provider  benzonatate (TESSALON) 100 MG capsule Take 1 capsule (100 mg total) by mouth 3 (three) times daily as needed for cough. 11/02/14   Trixie DredgeEmily West, PA-C  dextromethorphan (DELSYM) 30 MG/5ML liquid Take 5 mLs (30 mg total) by mouth  as needed for cough. 08/02/14   Kaitlyn Szekalski, PA-C  dextromethorphan-guaiFENesin (TUSSIN DM) 10-100 MG/5ML liquid Take 5 mLs by mouth every 6 (six) hours as needed for cough. 06/23/14   Junius FinnerErin O'Malley, PA-C  ibuprofen (ADVIL,MOTRIN) 800 MG tablet Take 1 tablet (800 mg total) by mouth every 8 (eight) hours as needed for mild pain or moderate pain. 11/02/14   Trixie DredgeEmily West, PA-C  loratadine-pseudoephedrine (CLARITIN-D 12 HOUR) 5-120 MG per tablet Take 1 tablet by mouth 2 (two) times daily. 08/02/14   Kaitlyn Szekalski, PA-C  medroxyPROGESTERone (DEPO-PROVERA) 150 MG/ML injection Inject 150 mg into the muscle every 3 (three) months.    Historical Provider, MD  promethazine (PHENERGAN) 25 MG tablet Take 1 tablet (25 mg total) by mouth every 6 (six) hours as needed for nausea or vomiting. 06/23/14   Junius FinnerErin O'Malley, PA-C  sodium chloride (OCEAN) 0.65 % SOLN nasal spray Place 1 spray into both nostrils as needed for congestion. 06/23/14   Junius FinnerErin O'Malley, PA-C   There were no vitals taken for this visit. Physical Exam  Constitutional: She is oriented to person, place, and time. She appears well-developed and well-nourished. No distress.  HENT:  Head: Normocephalic and atraumatic.  Nose: Nose normal.  Mouth/Throat: Oropharynx is clear and moist. No oropharyngeal exudate.  Eyes: Conjunctivae and EOM are normal. Pupils are equal, round, and reactive to  light. No scleral icterus.  Neck: Normal range of motion. Neck supple. No JVD present. No tracheal deviation present. No thyromegaly present.  Cardiovascular: Normal rate, regular rhythm and normal heart sounds.  Exam reveals no gallop and no friction rub.   No murmur heard. Pulmonary/Chest: Effort normal and breath sounds normal. No respiratory distress. She has no wheezes. She exhibits no tenderness.  Abdominal: Soft. Bowel sounds are normal. She exhibits no distension and no mass. There is no tenderness. There is no rebound and no guarding.  Musculoskeletal:  Normal range of motion. She exhibits no edema or tenderness.  Lymphadenopathy:    She has no cervical adenopathy.  Neurological: She is alert and oriented to person, place, and time. No cranial nerve deficit. She exhibits normal muscle tone.  Skin: Skin is warm and dry. No rash noted. No erythema. No pallor.  Nursing note and vitals reviewed.   ED Course  Procedures (including critical care time) DIAGNOSTIC STUDIES: Oxygen Saturation is 99% on room air which is normal by my interpretation.   COORDINATION OF CARE: 3:43 AM-Discussed treatment plan which includes meds, imaging and PCP referral, with pt at bedside and pt agreed to plan.   Labs Review Labs Reviewed  CBC WITH DIFFERENTIAL/PLATELET - Abnormal; Notable for the following:    Lymphocytes Relative 47 (*)    All other components within normal limits  I-STAT CHEM 8, ED - Abnormal; Notable for the following:    Potassium 3.4 (*)    All other components within normal limits    Imaging Review Dg Chest 2 View  12/09/2014   CLINICAL DATA:  Left-sided chest pain extending down the left arm. Cough and laryngitis.  EXAM: CHEST  2 VIEW  COMPARISON:  None.  FINDINGS: The heart size and mediastinal contours are within normal limits. Both lungs are clear. The visualized skeletal structures are unremarkable. Metallic piercings over the nipples.  IMPRESSION: No active cardiopulmonary disease.   Electronically Signed   By: Burman NievesWilliam  Stevens M.D.   On: 12/09/2014 04:12     EKG Interpretation   Date/Time:  Monday Dec 09 2014 03:45:30 EDT Ventricular Rate:  76 PR Interval:  139 QRS Duration: 79 QT Interval:  371 QTC Calculation: 417 R Axis:   60 Text Interpretation:  Sinus arrhythmia Artifact in lead(s) V6 and baseline  wander in lead(s) V6 Confirmed by Erroll Lunani, Dasiah Hooley Ayokunle [phone]([id number]) on  12/09/2014 4:24:51 AM      MDM   Final diagnoses:  Chest pain   Patient presents emergency department for chest pain and left elbow pain that  radiates to her left hand. The pain in her upper extremities described more as a weakness and tingling. I have low concern for an emergent diagnosis in this patient. She is PERC negative. ACS is not consistent with this patient's history. She denies any trauma. Patient was given Motrin and Norco for pain relief in the emergency department. Potassium replacement was given as well. Her emergency department workup is negative including laboratory studies, chest x-ray, EKG for cause of her symptoms. Patient was strongly advised to follow-up with primary care or neurology for continued management. Return precautions given. Patient overall appears well and pain is improved after medications. Her vital signs remain within her normal limits and she is safe for discharge.   I personally performed the services described in this documentation, which was scribed in my presence. The recorded information has been reviewed and is accurate.   Tomasita CrumbleAdeleke Louana Fontenot, MD 12/09/14 [id number]

## 2014-12-09 NOTE — ED Notes (Signed)
Pt states that she has had pain in her left elbow that radiates down to her hand that cause her fingers to be numb. Pt also reports pain to her left chest that is worse with palpation and inspiration.  Pt also reports having a runny nose, cough and congestions x 1 week.

## 2014-12-09 NOTE — Discharge Instructions (Signed)
Chest Pain (Nonspecific) Sabrina Brennan, continue to take Tylenol or Motrin as needed for pain. See a primary care physician within 3 days for follow-up. If symptoms worsen come back to emergency department immediately. Thank you.  It is often hard to give a diagnosis for the cause of chest pain. There is always a chance that your pain could be related to something serious, such as a heart attack or a blood clot in the lungs. You need to follow up with your doctor. HOME CARE  If antibiotic medicine was given, take it as directed by your doctor. Finish the medicine even if you start to feel better.  For the next few days, avoid activities that bring on chest pain. Continue physical activities as told by your doctor.  Do not use any tobacco products. This includes cigarettes, chewing tobacco, and e-cigarettes.  Avoid drinking alcohol.  Only take medicine as told by your doctor.  Follow your doctor's suggestions for more testing if your chest pain does not go away.  Keep all doctor visits you made. GET HELP IF:  Your chest pain does not go away, even after treatment.  You have a rash with blisters on your chest.  You have a fever. GET HELP RIGHT AWAY IF:   You have more pain or pain that spreads to your arm, neck, jaw, back, or belly (abdomen).  You have shortness of breath.  You cough more than usual or cough up blood.  You have very bad back or belly pain.  You feel sick to your stomach (nauseous) or throw up (vomit).  You have very bad weakness.  You pass out (faint).  You have chills. This is an emergency. Do not wait to see if the problems will go away. Call your local emergency services (911 in [place] Do not drive yourself to the hospital. MAKE SURE YOU:   Understand these instructions.  Will watch your condition.  Will get help right away if you are not doing well or get worse. Document Released: 01/05/2008 Document Revised: 07/24/2013 Document Reviewed:  01/05/2008 [facility] CenterExit[facility] Patient Information 2015 [place]ExitCare, [place]LLC. This information is not intended to replace advice given to you by your health care provider. Make sure you discuss any questions you have with your health care provider.

## 2014-12-22 ENCOUNTER — Emergency Department (HOSPITAL_COMMUNITY)
Admission: EM | Admit: 2014-12-22 | Discharge: 2014-12-22 | Disposition: A | Discharge status: Home / Self Care | Payer: 59 | Attending: Emergency Medicine | Admitting: Emergency Medicine

## 2014-12-22 ENCOUNTER — Encounter (HOSPITAL_COMMUNITY): Payer: Self-pay | Admitting: Physical Medicine and Rehabilitation

## 2014-12-22 DIAGNOSIS — Z79899 Other long term (current) drug therapy: Secondary | ICD-10-CM | POA: Insufficient documentation

## 2014-12-22 DIAGNOSIS — J020 Streptococcal pharyngitis: Secondary | ICD-10-CM | POA: Insufficient documentation

## 2014-12-22 DIAGNOSIS — H9201 Otalgia, right ear: Secondary | ICD-10-CM | POA: Insufficient documentation

## 2014-12-22 DIAGNOSIS — Z793 Long term (current) use of hormonal contraceptives: Secondary | ICD-10-CM | POA: Insufficient documentation

## 2014-12-22 LAB — RAPID STREP SCREEN (MED CTR MEBANE ONLY): Streptococcus, Group A Screen (Direct): POSITIVE — AB

## 2014-12-22 MED ORDER — PHENOL 1.4 % MT LIQD
1.0000 | OROMUCOSAL | Status: DC | PRN
  Administered 2014-12-22: 1 via OROMUCOSAL
  Filled 2014-12-22: qty 177

## 2014-12-22 MED ORDER — PENICILLIN G BENZATHINE 1200000 UNIT/2ML IM SUSP
1.2000 10*6.[IU] | Freq: Once | INTRAMUSCULAR | Status: AC
  Administered 2014-12-22: 1.2 10*6.[IU] via INTRAMUSCULAR
  Filled 2014-12-22: qty 2

## 2014-12-22 NOTE — ED Notes (Signed)
Pt observed eating [employer]. Pt is tolerating food well.

## 2014-12-22 NOTE — Discharge Instructions (Signed)
Continue to stay well-hydrated. Gargle warm salt water and spit it out, use chloraseptic spray as needed for sore throat. Continue to alternate between Tylenol and Ibuprofen for pain or fever. Followup with your primary care doctor in 5-7 days for recheck of ongoing symptoms. Return to emergency department for emergent changing or worsening of symptoms.   Strep Throat Strep throat is an infection of the throat. It is caused by a germ. Strep throat spreads from person to person by coughing, sneezing, or close contact. HOME CARE  Rinse your mouth (gargle) with warm salt water (1 teaspoon salt in 1 cup of water). Do this 3 to 4 times per day or as needed for comfort.  Family members with a sore throat or fever should see a doctor.  Make sure everyone in your house washes their hands well.  Do not share food, drinking cups, or personal items.  Eat soft foods until your sore throat gets better.  Drink enough water and fluids to keep your pee (urine) clear or pale yellow.  Rest.  Stay home from school, daycare, or work until you have taken medicine for 24 hours.  Only take medicine as told by your doctor.  Take your medicine as told. Finish it even if you start to feel better. GET HELP RIGHT AWAY IF:   You have new problems, such as throwing up (vomiting) or bad headaches.  You have a stiff or painful neck, chest pain, trouble breathing, or trouble swallowing.  You have very bad throat pain, drooling, or changes in your voice.  Your neck puffs up (swells) or gets red and tender.  You have a fever.  You are very tired, your mouth is dry, or you are peeing less than normal.  You cannot wake up completely.  You get a rash, cough, or earache.  You have green, yellow-brown, or bloody spit.  Your pain does not get better with medicine. MAKE SURE YOU:   Understand these instructions.  Will watch your condition.  Will get help right away if you are not doing well or get  worse. Document Released: 01/05/2008 Document Revised: 10/11/2011 Document Reviewed: 09/17/2010 [facility] LLCExitCare Patient Information 2015 [place]ExitCare, [place]LLC. This information is not intended to replace advice given to you by your health care provider. Make sure you discuss any questions you have with your health care provider.  Salt Water Gargle This solution will help make your mouth and throat feel better. HOME CARE INSTRUCTIONS   Mix 1 teaspoon of salt in 8 ounces of warm water.  Gargle with this solution as much or often as you need or as directed. Swish and gargle gently if you have any sores or wounds in your mouth.  Do not swallow this mixture. Document Released: 04/22/2004 Document Revised: 10/11/2011 Document Reviewed: 09/13/2008 [facility] CenterExitCare Patient Information 2015 [place]erExitCare, [place]LLC. This information is not intended to replace advice given to you by your health care provider. Make sure you discuss any questions you have with your health care provider.

## 2014-12-22 NOTE — ED Notes (Signed)
Onset 2 days sore throat, pt states "feels like throat is closing"; right ear pain- no ear drainage.  No c/o fever, cough, runny nose, nasal congestion.  Last dose of Tylenol yesterday evening.  No respiratory or swallowing difficulties.

## 2014-12-22 NOTE — ED Provider Notes (Signed)
CSN: [id number]     Arrival date & time 12/22/14  1627 History   This chart was scribed for non-physician practitioner working, [employer], PA-C, with Glynn Octave, MD, by Modena Jansky, ED Scribe. This patient was seen in room TR05C/TR05C and the patient's care was started at 5:39 PM.   Chief Complaint  Patient presents with  . Sore Throat  . Otalgia   Patient is a 24 y.o. female presenting with pharyngitis and ear pain. The history is provided by the patient. No language interpreter was used.  Sore Throat This is a new problem. The current episode started 2 days ago. The problem occurs rarely. The problem has not changed since onset.Pertinent negatives include no chest pain, no abdominal pain and no shortness of breath. The symptoms are aggravated by swallowing. Nothing relieves the symptoms. She has tried acetaminophen for the symptoms. The treatment provided no relief.  Otalgia Location:  Right Severity:  Moderate Duration:  3 days Timing:  Constant Progression:  Unchanged Chronicity:  New Relieved by:  Nothing Worsened by:  Nothing tried Ineffective treatments:  OTC medications Associated symptoms: sore throat   Associated symptoms: no abdominal pain, no cough, no diarrhea, no ear discharge, no fever, no rash, no rhinorrhea and no vomiting    HPI Comments: FANTASHIA SHUPERT is a 24 y.o. healthy female who presents to the Emergency Department complaining of constant moderate 8/10 sore throat and nonradiating, that started two days ago. She states that she has been having a sore throat, chills, and right ear pain. She reports that swallowing exacerbates the sore throat, but she can still swallow. She states that she has been taking ibuprofen and tylenol without any relief. She denies any sick contacts or recent travel. She also denies any fever, trismus, drooling, difficulty swallowing, eye pain or drainage, chest pain, SOB, cough, ear discharge, abdominal pain, nausea, or  vomiting, numbness, tingling, weakness, or rashes.   History reviewed. No pertinent past medical history. History reviewed. No pertinent past surgical history. No family history on file. History  Substance Use Topics  . Smoking status: Never Smoker   . Smokeless tobacco: Never Used  . Alcohol Use: 1.0 oz/week    2 drink(s) per week   OB History    Gravida Para Term Preterm AB TAB SAB Ectopic Multiple Living   0               Obstetric Comments   1st Menstrual Cycle:  12     Review of Systems  Constitutional: Positive for chills. Negative for fever.  HENT: Positive for ear pain (right) and sore throat. Negative for drooling, ear discharge, rhinorrhea and trouble swallowing.   Eyes: Negative for pain and itching.  Respiratory: Negative for cough and shortness of breath.   Cardiovascular: Negative for chest pain.  Gastrointestinal: Negative for nausea, vomiting, abdominal pain and diarrhea.  Musculoskeletal: Negative for myalgias and arthralgias.  Skin: Negative for rash.  Allergic/Immunologic: Negative for immunocompromised state.  Neurological: Negative for weakness and numbness.  10 Systems reviewed and all are negative for acute change except as noted in the HPI.  Allergies  Review of patient's allergies indicates no known allergies.  Home Medications   Prior to Admission medications   Medication Sig Start Date End Date Taking? Authorizing Provider  benzonatate (TESSALON) 100 MG capsule Take 1 capsule (100 mg total) by mouth 3 (three) times daily as needed for cough. 11/02/14   Trixie Dredge, PA-C  dextromethorphan (DELSYM) 30 MG/5ML liquid Take  5 mLs (30 mg total) by mouth as needed for cough. 08/02/14   Kaitlyn Szekalski, PA-C  dextromethorphan-guaiFENesin (TUSSIN DM) 10-100 MG/5ML liquid Take 5 mLs by mouth every 6 (six) hours as needed for cough. 06/23/14   Junius FinnerErin O'Malley, PA-C  ibuprofen (ADVIL,MOTRIN) 800 MG tablet Take 1 tablet (800 mg total) by mouth every 8 (eight) hours  as needed for mild pain or moderate pain. 11/02/14   Trixie DredgeEmily West, PA-C  loratadine-pseudoephedrine (CLARITIN-D 12 HOUR) 5-120 MG per tablet Take 1 tablet by mouth 2 (two) times daily. 08/02/14   Kaitlyn Szekalski, PA-C  medroxyPROGESTERone (DEPO-PROVERA) 150 MG/ML injection Inject 150 mg into the muscle every 3 (three) months.    Historical Provider, MD  promethazine (PHENERGAN) 25 MG tablet Take 1 tablet (25 mg total) by mouth every 6 (six) hours as needed for nausea or vomiting. 06/23/14   Junius FinnerErin O'Malley, PA-C  sodium chloride (OCEAN) 0.65 % SOLN nasal spray Place 1 spray into both nostrils as needed for congestion. 06/23/14   Junius FinnerErin O'Malley, PA-C   BP 100/74 mmHg  Pulse 75  Temp(Src) 98.8 F (37.1 C) (Oral)  Resp 18  Ht 4\' 11"  (1.499 m)  Wt 118 lb 4.8 oz (53.661 kg)  BMI 23.88 kg/m2  SpO2 100% Physical Exam  Constitutional: She is oriented to person, place, and time. Vital signs are normal. She appears well-developed and well-nourished.  Non-toxic appearance. No distress.  Afebrile, nontoxic, NAD  HENT:  Head: Normocephalic and atraumatic.  Right Ear: Hearing, tympanic membrane, external ear and ear canal normal.  Left Ear: Hearing, tympanic membrane, external ear and ear canal normal.  Nose: Nose normal.  Mouth/Throat: Uvula is midline and mucous membranes are normal. No trismus in the jaw. No uvula swelling. Oropharyngeal exudate, posterior oropharyngeal edema and posterior oropharyngeal erythema present. No tonsillar abscesses.  Ears are clear bilaterally. Nose clear. Oropharynx with 1+ tonsillar swelling and exudates bilaterally, erythematous, without uvular swelling or deviation, no trismus or drooling. Eating in exam room.  Eyes: Conjunctivae and EOM are normal. Right eye exhibits no discharge. Left eye exhibits no discharge.  Neck: Normal range of motion. Neck supple.  Cardiovascular: Normal rate, regular rhythm, normal heart sounds and intact distal pulses.  Exam reveals no gallop and  no friction rub.   No murmur heard. Pulmonary/Chest: Effort normal and breath sounds normal. No respiratory distress. She has no decreased breath sounds. She has no wheezes. She has no rhonchi. She has no rales.  Abdominal: Soft. Normal appearance and bowel sounds are normal. She exhibits no distension. There is no tenderness. There is no rigidity, no rebound and no guarding.  Musculoskeletal: Normal range of motion.  Lymphadenopathy:       Head (right side): Tonsillar adenopathy present.       Head (left side): Tonsillar adenopathy present.    She has no cervical adenopathy.  Tonsillar LAD bilaterally which is TTP, no cervical LAD  Neurological: She is alert and oriented to person, place, and time. She has normal strength. No sensory deficit.  Skin: Skin is warm, dry and intact. No rash noted.  Psychiatric: She has a normal mood and affect. Her behavior is normal.  Nursing note and vitals reviewed.   ED Course  Procedures (including critical care time) DIAGNOSTIC STUDIES: Oxygen Saturation is 100% on RA, normal by my interpretation.    COORDINATION OF CARE: 5:43 PM- Pt advised of plan for treatment which includes medication and labs and pt agrees.  Labs Review Labs Reviewed  RAPID STREP  SCREEN - Abnormal; Notable for the following:    Streptococcus, Group A Screen (Direct) POSITIVE (*)    All other components within normal limits    Imaging Review No results found.   EKG Interpretation None      MDM   Final diagnoses:  Strep pharyngitis    24 y.o. female here with sore throat and R ear pain. Erythematous tonsils with swelling and exudates, no evidence of PTA or ludwigs, handling secretions well. Will obtain RST since centor criteria is moderate. Discussed that it could be viral such as mono but too early to treat. Will give chloraseptic spray for pain relief and reassess shortly.  6:51 PM RST positive, pt opts for shot of bicillin. Discussed tylenol/motrin for pain  and chloraseptic spray. Will have her f/up with PCP in 1wk to ensure resolution of symptoms. I explained the diagnosis and have given explicit precautions to return to the ER including for any other new or worsening symptoms. The patient understands and accepts the medical plan as it's been dictated and I have answered their questions. Discharge instructions concerning home care and prescriptions have been given. The patient is STABLE and is discharged to home in good condition.   I personally performed the services described in this documentation, which was scribed in my presence. The recorded information has been reviewed and is accurate.  BP 100/74 mmHg  Pulse 75  Temp(Src) 98.8 F (37.1 C) (Oral)  Resp 18  Ht  (1.499 m)  Wt 118 lb 4.8 oz (53.661 kg)  BMI 23.88 kg/m2  SpO2 100%  Meds ordered this encounter  Medications  . phenol (CHLORASEPTIC) mouth spray 1 spray    Sig:   . penicillin g benzathine (BICILLIN LA) 1200000 UNIT/2ML injection 1.2 Million Units    Sig:     Order Specific Question:  Antibiotic Indication:    Answer:  Pharyngitis     Genelda Roark Camprubi-Soms, PA-C 12/22/14 1857  Glynn Octave, MD 12/23/14 [phone]

## 2014-12-22 NOTE — ED Notes (Signed)
Pt presents to department for evaluation of sinus congestion and pressure. Ongoing x2 days. Respirations unlabored. NAD.

## 2015-02-06 ENCOUNTER — Encounter (HOSPITAL_COMMUNITY): Payer: Self-pay | Admitting: Emergency Medicine

## 2015-02-06 ENCOUNTER — Emergency Department (HOSPITAL_COMMUNITY)
Admission: EM | Admit: 2015-02-06 | Discharge: 2015-02-06 | Disposition: A | Discharge status: Home / Self Care | Payer: 59 | Attending: Emergency Medicine | Admitting: Emergency Medicine

## 2015-02-06 DIAGNOSIS — R300 Dysuria: Secondary | ICD-10-CM

## 2015-02-06 DIAGNOSIS — N390 Urinary tract infection, site not specified: Secondary | ICD-10-CM | POA: Insufficient documentation

## 2015-02-06 DIAGNOSIS — Z3202 Encounter for pregnancy test, result negative: Secondary | ICD-10-CM | POA: Insufficient documentation

## 2015-02-06 LAB — PREGNANCY, URINE: Preg Test, Ur: NEGATIVE

## 2015-02-06 LAB — URINALYSIS, ROUTINE W REFLEX MICROSCOPIC
Bilirubin Urine: NEGATIVE
Glucose, UA: NEGATIVE mg/dL
Ketones, ur: NEGATIVE mg/dL
Nitrite: NEGATIVE
Protein, ur: 100 mg/dL — AB
Specific Gravity, Urine: 1.026 (ref 1.005–1.030)
Urobilinogen, UA: 1 mg/dL (ref 0.0–1.0)
pH: 7.5 (ref 5.0–8.0)

## 2015-02-06 LAB — CBC WITH DIFFERENTIAL/PLATELET
Basophils Absolute: 0 10*3/uL (ref 0.0–0.1)
Basophils Relative: 0 % (ref 0–1)
Eosinophils Absolute: 0.2 10*3/uL (ref 0.0–0.7)
Eosinophils Relative: 3 % (ref 0–5)
HCT: 35 % — ABNORMAL LOW (ref 36.0–46.0)
Hemoglobin: 11.7 g/dL — ABNORMAL LOW (ref 12.0–15.0)
Lymphocytes Relative: 50 % — ABNORMAL HIGH (ref 12–46)
Lymphs Abs: 2.7 10*3/uL (ref 0.7–4.0)
MCH: 31.4 pg (ref 26.0–34.0)
MCHC: 33.4 g/dL (ref 30.0–36.0)
MCV: 93.8 fL (ref 78.0–100.0)
Monocytes Absolute: 0.4 10*3/uL (ref 0.1–1.0)
Monocytes Relative: 7 % (ref 3–12)
Neutro Abs: 2.2 10*3/uL (ref 1.7–7.7)
Neutrophils Relative %: 40 % — ABNORMAL LOW (ref 43–77)
Platelets: 340 10*3/uL (ref 150–400)
RBC: 3.73 MIL/uL — ABNORMAL LOW (ref 3.87–5.11)
RDW: 12.1 % (ref 11.5–15.5)
WBC: 5.5 10*3/uL (ref 4.0–10.5)

## 2015-02-06 LAB — URINE MICROSCOPIC-ADD ON

## 2015-02-06 LAB — BASIC METABOLIC PANEL
Anion gap: 6 (ref 5–15)
BUN: 9 mg/dL (ref 6–20)
CO2: 24 mmol/L (ref 22–32)
Calcium: 8.2 mg/dL — ABNORMAL LOW (ref 8.9–10.3)
Chloride: 107 mmol/L (ref 101–111)
Creatinine, Ser: 0.72 mg/dL (ref 0.44–1.00)
GFR calc Af Amer: >60 mL/min (ref >60)
GFR calc non Af Amer: >60 mL/min (ref >60)
Glucose, Bld: 90 mg/dL (ref 65–99)
Potassium: 3.8 mmol/L (ref 3.5–5.1)
Sodium: 137 mmol/L (ref 135–145)

## 2015-02-06 MED ORDER — ONDANSETRON 4 MG PO TBDP: 4.0000 mg | ORAL_TABLET | Freq: Three times a day (TID) | ORAL | Status: DC | PRN

## 2015-02-06 MED ORDER — KETOROLAC TROMETHAMINE 30 MG/ML IJ SOLN
30.0000 mg | Freq: Once | INTRAMUSCULAR | Status: AC
  Administered 2015-02-06: 30 mg via INTRAVENOUS
  Filled 2015-02-06: qty 1

## 2015-02-06 MED ORDER — CEPHALEXIN 500 MG PO CAPS: 500.0000 mg | ORAL_CAPSULE | Freq: Four times a day (QID) | ORAL | Status: DC

## 2015-02-06 MED ORDER — ONDANSETRON HCL 4 MG/2ML IJ SOLN
4.0000 mg | Freq: Once | INTRAMUSCULAR | Status: AC
  Administered 2015-02-06: 4 mg via INTRAVENOUS
  Filled 2015-02-06: qty 2

## 2015-02-06 MED ORDER — SODIUM CHLORIDE 0.9 % IV BOLUS (SEPSIS)
1000.0000 mL | Freq: Once | INTRAVENOUS | Status: AC
  Administered 2015-02-06: 1000 mL via INTRAVENOUS

## 2015-02-06 MED ORDER — TRAMADOL HCL 50 MG PO TABS: 50.0000 mg | ORAL_TABLET | Freq: Four times a day (QID) | ORAL | Status: DC | PRN

## 2015-02-06 NOTE — ED Notes (Signed)
Pt given water to drink. 

## 2015-02-06 NOTE — ED Provider Notes (Signed)
CSN: [id number]     Arrival date & time 02/06/15  7829 History   First MD Initiated Contact with Patient 02/06/15 [phone]     Chief Complaint  Patient presents with  . Abdominal Pain     (Consider location/radiation/quality/duration/timing/severity/associated sxs/prior Treatment) The history is provided by the patient and medical records.    This is a 24 y.o. F with no significant PMH, presenting to the ED for abdominal pain.  Patient states pain began yesterday and first localized to left lower abdomen and left flank.  States she did begin having some dysuria but denies hematuria.  States she ate dinner last night but had nausea, vomiting afterwards.  States continues having nausea but no further vomiting.  Pain now localized to right flank and right lower abdomen.  Denies fever but endorses chills.  No hx of kidney stones.  Patient states similar symptoms in the past with prior UTI.  No intervention tried PTA.  VSS.  History reviewed. No pertinent past medical history. History reviewed. No pertinent past surgical history. History reviewed. No pertinent family history. History  Substance Use Topics  . Smoking status: Never Smoker   . Smokeless tobacco: Never Used  . Alcohol Use: 1.0 oz/week    2 drink(s) per week   OB History    Gravida Para Term Preterm AB TAB SAB Ectopic Multiple Living   0               Obstetric Comments   1st Menstrual Cycle:  12     Review of Systems  Gastrointestinal: Positive for nausea, vomiting and abdominal pain.  Genitourinary: Positive for dysuria and flank pain.  All other systems reviewed and are negative.     Allergies  Review of patient's allergies indicates no known allergies.  Home Medications   Prior to Admission medications   Medication Sig Start Date End Date Taking? Authorizing Provider  benzonatate (TESSALON) 100 MG capsule Take 1 capsule (100 mg total) by mouth 3 (three) times daily as needed for cough. Patient not taking:  Reported on 02/06/2015 11/02/14   Trixie Dredge, PA-C  dextromethorphan (DELSYM) 30 MG/5ML liquid Take 5 mLs (30 mg total) by mouth as needed for cough. Patient not taking: Reported on 02/06/2015 08/02/14   Emilia Beck, PA-C  dextromethorphan-guaiFENesin (TUSSIN DM) 10-100 MG/5ML liquid Take 5 mLs by mouth every 6 (six) hours as needed for cough. Patient not taking: Reported on 02/06/2015 06/23/14   Junius Finner, PA-C  ibuprofen (ADVIL,MOTRIN) 800 MG tablet Take 1 tablet (800 mg total) by mouth every 8 (eight) hours as needed for mild pain or moderate pain. Patient not taking: Reported on 02/06/2015 11/02/14   Trixie Dredge, PA-C  loratadine-pseudoephedrine (CLARITIN-D 12 HOUR) 5-120 MG per tablet Take 1 tablet by mouth 2 (two) times daily. Patient not taking: Reported on 02/06/2015 08/02/14   Emilia Beck, PA-C  promethazine (PHENERGAN) 25 MG tablet Take 1 tablet (25 mg total) by mouth every 6 (six) hours as needed for nausea or vomiting. Patient not taking: Reported on 02/06/2015 06/23/14   Junius Finner, PA-C  sodium chloride (OCEAN) 0.65 % SOLN nasal spray Place 1 spray into both nostrils as needed for congestion. Patient not taking: Reported on 02/06/2015 06/23/14   Junius Finner, PA-C   BP 90/61 mmHg  Pulse 69  Temp(Src) 98.7 F (37.1 C) (Oral)  Resp 16  Ht  (1.499 m)  Wt 120 lb (54.432 kg)  BMI 24.22 kg/m2  SpO2 99%  LMP  Physical Exam  Constitutional: She is oriented to person, place, and time. She appears well-developed and well-nourished.  HENT:  Head: Normocephalic and atraumatic.  Mouth/Throat: Oropharynx is clear and moist.  Eyes: Conjunctivae and EOM are normal. Pupils are equal, round, and reactive to light.  Neck: Normal range of motion.  Cardiovascular: Normal rate, regular rhythm and normal heart sounds.   Pulmonary/Chest: Effort normal and breath sounds normal. No respiratory distress. She has no wheezes.  Abdominal: Soft. Bowel sounds are normal. There is tenderness in  the right lower quadrant. There is CVA tenderness (right). There is no tenderness at McBurney's point and negative Murphy's sign.  Musculoskeletal: Normal range of motion.  Neurological: She is alert and oriented to person, place, and time.  Skin: Skin is warm and dry.  Psychiatric: She has a normal mood and affect.  Nursing note and vitals reviewed.   ED Course  Procedures (including critical care time) Labs Review Labs Reviewed  CBC WITH DIFFERENTIAL/PLATELET - Abnormal; Notable for the following:    RBC 3.73 (*)    Hemoglobin 11.7 (*)    HCT 35.0 (*)    Neutrophils Relative % 40 (*)    Lymphocytes Relative 50 (*)    All other components within normal limits  BASIC METABOLIC PANEL - Abnormal; Notable for the following:    Calcium 8.2 (*)    All other components within normal limits  URINALYSIS, ROUTINE W REFLEX MICROSCOPIC (NOT AT [facility]) - Abnormal; Notable for the following:    APPearance TURBID (*)    Hgb urine dipstick MODERATE (*)    Protein, ur 100 (*)    Leukocytes, UA MODERATE (*)    All other components within normal limits  URINE MICROSCOPIC-ADD ON - Abnormal; Notable for the following:    Squamous Epithelial / LPF MANY (*)    Bacteria, UA MANY (*)    All other components within normal limits  URINE CULTURE  PREGNANCY, URINE    Imaging Review No results found.   EKG Interpretation None      MDM   Final diagnoses:  UTI (lower urinary tract infection)  Dysuria   24 year old female here with dysuria and right flank pain. No history of kidney stones. Patient is afebrile, nontoxic. She does have right CVA tenderness and right lower quadrant tenderness. No tenderness at McBurney's point. Suspect UTI.  Laboratory was obtained which is reassuring, no leukocytosis. Renal function preserved. UA appears infectious, culture pending.  Given u/a results and exam findings, may represent early pyelonephritis.  Patient was treated here with toradol and zofran with  significant improvement of pain.  She has tolerated PO without difficulty.  Will d/c home with abx, tramadol, and zofran.  FU with PCP.  Discussed plan with patient, he/she acknowledged understanding and agreed with plan of care.  Return precautions given for new or worsening symptoms.  Garlon HatchetLisa M Adileny Delon, PA-C 02/06/15 1030  Shon Batonourtney F Horton, MD 02/07/15 0120

## 2015-02-06 NOTE — ED Notes (Signed)
Pt able to keep ginger ale down. Pt sts ginger ale burns when she swallows. Pt given water and tolerating fluids.

## 2015-02-06 NOTE — ED Notes (Signed)
Pt stated that she had an acute onset of abd pain that radiated to her left flank yesterday. Pt states that today the abd pain is radiating to her right flank. Pt unable to keep food down and has vomited once yesterday.

## 2015-02-06 NOTE — Discharge Instructions (Signed)
Take the prescribed medication as directed. Follow-up with your primary care physician. Return to the ED for new or worsening symptoms-- worsening pain, uncontrolled nausea/vomiting, high fever, etc.

## 2015-02-08 LAB — URINE CULTURE: Culture: >=100000

## 2015-02-11 ENCOUNTER — Telehealth: Payer: Self-pay | Admitting: *Deleted

## 2015-02-11 NOTE — ED Notes (Signed)
(+)  urine culture, treated with Cephalexin, OK per J. Frans, Pharm 

## 2015-04-22 ENCOUNTER — Encounter: Payer: Self-pay | Admitting: Emergency Medicine

## 2015-04-22 ENCOUNTER — Emergency Department
Admission: EM | Admit: 2015-04-22 | Discharge: 2015-04-22 | Disposition: A | Discharge status: Home / Self Care | Payer: 59 | Attending: Student | Admitting: Student

## 2015-04-22 DIAGNOSIS — Z79899 Other long term (current) drug therapy: Secondary | ICD-10-CM | POA: Insufficient documentation

## 2015-04-22 DIAGNOSIS — K529 Noninfective gastroenteritis and colitis, unspecified: Secondary | ICD-10-CM

## 2015-04-22 DIAGNOSIS — A0811 Acute gastroenteropathy due to Norwalk agent: Secondary | ICD-10-CM | POA: Insufficient documentation

## 2015-04-22 LAB — COMPREHENSIVE METABOLIC PANEL
ALT: 13 U/L — ABNORMAL LOW (ref 14–54)
AST: 16 U/L (ref 15–41)
Albumin: 4.1 g/dL (ref 3.5–5.0)
Alkaline Phosphatase: 66 U/L (ref 38–126)
Anion gap: 5 (ref 5–15)
BUN: 10 mg/dL (ref 6–20)
CO2: 25 mmol/L (ref 22–32)
Calcium: 8.9 mg/dL (ref 8.9–10.3)
Chloride: 109 mmol/L (ref 101–111)
Creatinine, Ser: 0.75 mg/dL (ref 0.44–1.00)
GFR calc Af Amer: >60 mL/min (ref >60)
GFR calc non Af Amer: >60 mL/min (ref >60)
Glucose, Bld: 94 mg/dL (ref 65–99)
Potassium: 3.8 mmol/L (ref 3.5–5.1)
Sodium: 139 mmol/L (ref 135–145)
Total Bilirubin: 1 mg/dL (ref 0.3–1.2)
Total Protein: 7.3 g/dL (ref 6.5–8.1)

## 2015-04-22 LAB — URINALYSIS COMPLETE WITH MICROSCOPIC (ARMC ONLY)
Bilirubin Urine: NEGATIVE
Glucose, UA: NEGATIVE mg/dL
Hgb urine dipstick: NEGATIVE
Ketones, ur: NEGATIVE mg/dL
Leukocytes, UA: NEGATIVE
Nitrite: NEGATIVE
Protein, ur: NEGATIVE mg/dL
Specific Gravity, Urine: 1.012 (ref 1.005–1.030)
pH: 6 (ref 5.0–8.0)

## 2015-04-22 LAB — CBC
HCT: 36.8 % (ref 35.0–47.0)
Hemoglobin: 12.2 g/dL (ref 12.0–16.0)
MCH: 31.3 pg (ref 26.0–34.0)
MCHC: 33.2 g/dL (ref 32.0–36.0)
MCV: 94.3 fL (ref 80.0–100.0)
Platelets: 297 10*3/uL (ref 150–440)
RBC: 3.9 MIL/uL (ref 3.80–5.20)
RDW: 13.1 % (ref 11.5–14.5)
WBC: 4.5 10*3/uL (ref 3.6–11.0)

## 2015-04-22 LAB — LIPASE, BLOOD: Lipase: 23 U/L (ref 22–51)

## 2015-04-22 MED ORDER — ONDANSETRON HCL 4 MG PO TABS: 4.0000 mg | ORAL_TABLET | Freq: Four times a day (QID) | ORAL | Status: DC | PRN

## 2015-04-22 NOTE — ED Notes (Signed)
poct negative  

## 2015-04-22 NOTE — ED Notes (Signed)
States she developed some generalized abd discomfort  With fever and chills.  some n/v/d. Last time vomitied was couple of hours ago.Marland Kitchen

## 2015-04-22 NOTE — ED Provider Notes (Signed)
[facility] Emergency Department Provider Note ____________________________________________  Time seen: 1130  I have reviewed the triage vital signs and the nursing notes.  HISTORY  Chief Complaint  Emesis; Nausea; and Diarrhea   HPI Sabrina Brennan is a 24 y.o. female reports to the ED for evaluation management of several days complaint of nausea, vomiting, belly pain, and diarrhea. She reports similar symptoms in her close contacts. She denies any measured fevers, but reports some intermittent chills. She's been able to tolerate some small amount of food and solids without intractable nausea or vomiting.She reports her intermittent abdominal pain at about an 8 out of 10 in triage.  History reviewed. No pertinent past medical history.  Patient Active Problem List   Diagnosis Date Noted  . Breast mass in female 08/29/2013    History reviewed. No pertinent past surgical history.  Current Outpatient Rx  Name  Route  Sig  Dispense  Refill  . benzonatate (TESSALON) 100 MG capsule   Oral   Take 1 capsule (100 mg total) by mouth 3 (three) times daily as needed for cough. Patient not taking: Reported on 02/06/2015   21 capsule   0   . cephALEXin (KEFLEX) 500 MG capsule   Oral   Take 1 capsule (500 mg total) by mouth 4 (four) times daily.   40 capsule   0   . dextromethorphan (DELSYM) 30 MG/5ML liquid   Oral   Take 5 mLs (30 mg total) by mouth as needed for cough. Patient not taking: Reported on 02/06/2015   89 mL   0   . dextromethorphan-guaiFENesin (TUSSIN DM) 10-100 MG/5ML liquid   Oral   Take 5 mLs by mouth every 6 (six) hours as needed for cough. Patient not taking: Reported on 02/06/2015   120 mL   0   . ibuprofen (ADVIL,MOTRIN) 800 MG tablet   Oral   Take 1 tablet (800 mg total) by mouth every 8 (eight) hours as needed for mild pain or moderate pain. Patient not taking: Reported on 02/06/2015   15 tablet   0   . loratadine-pseudoephedrine  (CLARITIN-D 12 HOUR) 5-120 MG per tablet   Oral   Take 1 tablet by mouth 2 (two) times daily. Patient not taking: Reported on 02/06/2015   20 tablet   0   . ondansetron (ZOFRAN ODT) 4 MG disintegrating tablet   Oral   Take 1 tablet (4 mg total) by mouth every 8 (eight) hours as needed for nausea.   10 tablet   0   . ondansetron (ZOFRAN) 4 MG tablet   Oral   Take 1 tablet (4 mg total) by mouth every 6 (six) hours as needed for nausea or vomiting.   12 tablet   0   . promethazine (PHENERGAN) 25 MG tablet   Oral   Take 1 tablet (25 mg total) by mouth every 6 (six) hours as needed for nausea or vomiting. Patient not taking: Reported on 02/06/2015   10 tablet   0   . sodium chloride (OCEAN) 0.65 % SOLN nasal spray   Each Nare   Place 1 spray into both nostrils as needed for congestion. Patient not taking: Reported on 02/06/2015   1 Bottle   0   . traMADol (ULTRAM) 50 MG tablet   Oral   Take 1 tablet (50 mg total) by mouth every 6 (six) hours as needed.   15 tablet   0    Allergies Review of patient's allergies  indicates no known allergies.  History reviewed. No pertinent family history.  Social History Social History  Substance Use Topics  . Smoking status: Never Smoker   . Smokeless tobacco: Never Used  . Alcohol Use: 1.0 oz/week    2 Standard drinks or equivalent per week   Review of Systems  Constitutional: Negative for fever. Eyes: Negative for visual changes. ENT: Negative for sore throat. Cardiovascular: Negative for chest pain. Respiratory: Negative for shortness of breath. Gastrointestinal: Positive for abdominal pain, vomiting and diarrhea. Genitourinary: Negative for dysuria. Musculoskeletal: Negative for back pain. Skin: Negative for rash. Neurological: Negative for headaches, focal weakness or numbness. ____________________________________________  PHYSICAL EXAM:  VITAL SIGNS: ED Triage Vitals  Enc Vitals Group     BP 04/22/15 1039 101/62 mmHg      Pulse Rate 04/22/15 1039 73     Resp 04/22/15 1039 22     Temp 04/22/15 1039 98.2 F (36.8 C)     Temp Source 04/22/15 1039 Oral     SpO2 04/22/15 1039 99 %     Weight 04/22/15 1039 120 lb (54.432 kg)     Height 04/22/15 1039  (1.499 m)     Head Cir --      Peak Flow --      Pain Score 04/22/15 1040 8     Pain Loc --      Pain Edu? --      Excl. in GC? --    Constitutional: Alert and oriented. Well appearing and in no distress. Eyes: Conjunctivae are normal. PERRL. Normal extraocular movements. ENT   Head: Normocephalic and atraumatic.   Nose: No congestion/rhinorrhea.   Mouth/Throat: Mucous membranes are moist.   Neck: Supple. No thyromegaly. Hematological/Lymphatic/Immunological: No cervical lymphadenopathy. Cardiovascular: Normal rate, regular rhythm.  Respiratory: Normal respiratory effort. No wheezes/rales/rhonchi. Gastrointestinal: Soft and nontender. No distention. Musculoskeletal: Nontender with normal range of motion in all extremities.  Neurologic:  Normal gait without ataxia. Normal speech and language. No gross focal neurologic deficits are appreciated. Skin:  Skin is warm, dry and intact. No rash noted. Psychiatric: Mood and affect are normal. Patient exhibits appropriate insight and judgment. ____________________________________________   LABS (pertinent positives/negatives) Labs Reviewed  COMPREHENSIVE METABOLIC PANEL - Abnormal; Notable for the following:    ALT 13 (*)    All other components within normal limits  URINALYSIS COMPLETEWITH MICROSCOPIC ([facility] ONLY) - Abnormal; Notable for the following:    Color, Urine STRAW (*)    APPearance CLEAR (*)    Bacteria, UA RARE (*)    Squamous Epithelial / LPF 0-5 (*)    All other components within normal limits  LIPASE, BLOOD  CBC  POC URINE PREG, ED  ____________________________________________  PROCEDURES  Zofran 4 mg ODT  ____________________________________________  INITIAL  IMPRESSION / ASSESSMENT AND PLAN / ED COURSE  Acute symptoms consistent with a viral gastroenteritis, likely community acquired. We'll treat with Zofran for nausea and vomiting control. Patient is encouraged to increase fluid intake and began solid foods using a BRAT diet guidelines. Work note provided for today as requested. Follow with primary care provider for ongoing symptoms. ____________________________________________  FINAL CLINICAL IMPRESSION(S) / ED DIAGNOSES  Final diagnoses:  Gastroenteritis, infectious, presumed  Viral gastroenteritis due to [place]-like agent     Lissa Hoard, PA-C 04/22/15 1157  Gayla Doss, MD 04/22/15 1600

## 2015-04-22 NOTE — ED Notes (Addendum)
Pt to ed with c/o several days of nausea, vomiting and diarrhea and abd pain.  Pt skin warm and dry pt in nad at triage at this time. Pt alert and oriented and denies dizziness or weakness.

## 2015-04-22 NOTE — Discharge Instructions (Signed)
Viral Gastroenteritis °Viral gastroenteritis is also known as stomach flu. This condition affects the stomach and intestinal tract. It can cause sudden diarrhea and vomiting. The illness typically lasts 3 to 8 days. Most people develop an immune response that eventually gets rid of the virus. While this natural response develops, the virus can make you quite ill. °CAUSES  °Many different viruses can cause gastroenteritis, such as rotavirus or noroviruses. You can catch one of these viruses by consuming contaminated food or water. You may also catch a virus by sharing utensils or other personal items with an infected person or by touching a contaminated surface. °SYMPTOMS  °The most common symptoms are diarrhea and vomiting. These problems can cause a severe loss of body fluids (dehydration) and a body salt (electrolyte) imbalance. Other symptoms may include: °· Fever. °· Headache. °· Fatigue. °· Abdominal pain. °DIAGNOSIS  °Your caregiver can usually diagnose viral gastroenteritis based on your symptoms and a physical exam. A stool sample may also be taken to test for the presence of viruses or other infections. °TREATMENT  °This illness typically goes away on its own. Treatments are aimed at rehydration. The most serious cases of viral gastroenteritis involve vomiting so severely that you are not able to keep fluids down. In these cases, fluids must be given through an intravenous line (IV). °HOME CARE INSTRUCTIONS  °· Drink enough fluids to keep your urine clear or pale yellow. Drink small amounts of fluids frequently and increase the amounts as tolerated. °· Ask your caregiver for specific rehydration instructions. °· Avoid: °¨ Foods high in sugar. °¨ Alcohol. °¨ Carbonated drinks. °¨ Tobacco. °¨ Juice. °¨ Caffeine drinks. °¨ Extremely hot or cold fluids. °¨ Fatty, greasy foods. °¨ Too much intake of anything at one time. °¨ Dairy products until 24 to 48 hours after diarrhea stops. °· You may consume probiotics.  Probiotics are active cultures of beneficial bacteria. They may lessen the amount and number of diarrheal stools in adults. Probiotics can be found in yogurt with active cultures and in supplements. °· Wash your hands well to avoid spreading the virus. °· Only take over-the-counter or prescription medicines for pain, discomfort, or fever as directed by your caregiver. Do not give aspirin to children. Antidiarrheal medicines are not recommended. °· Ask your caregiver if you should continue to take your regular prescribed and over-the-counter medicines. °· Keep all follow-up appointments as directed by your caregiver. °SEEK IMMEDIATE MEDICAL CARE IF:  °· You are unable to keep fluids down. °· You do not urinate at least once every 6 to 8 hours. °· You develop shortness of breath. °· You notice blood in your stool or vomit. This may look like coffee grounds. °· You have abdominal pain that increases or is concentrated in one small area (localized). °· You have persistent vomiting or diarrhea. °· You have a fever. °· The patient is a child younger than 3 months, and he or she has a fever. °· The patient is a child older than 3 months, and he or she has a fever and persistent symptoms. °· The patient is a child older than 3 months, and he or she has a fever and symptoms suddenly get worse. °· The patient is a baby, and he or she has no tears when crying. °MAKE SURE YOU:  °· Understand these instructions. °· Will watch your condition. °· Will get help right away if you are not doing well or get worse. °Document Released: 07/19/2005 Document Revised: 10/11/2011 Document Reviewed: 05/05/2011 °  [employer] Patient Information 2015 [place], [place]. This information is not intended to replace advice given to you by your health care provider. Make sure you discuss any questions you have with your health care provider.  Take the nausea medicine as needed. Increase fluid intake to prevent dehydration. Wash hands with soap & water  to reduce and prevent spread of germs.

## 2015-05-08 ENCOUNTER — Emergency Department
Admission: EM | Admit: 2015-05-08 | Discharge: 2015-05-08 | Disposition: A | Discharge status: Home / Self Care | Payer: 59 | Attending: Emergency Medicine | Admitting: Emergency Medicine

## 2015-05-08 DIAGNOSIS — J069 Acute upper respiratory infection, unspecified: Secondary | ICD-10-CM | POA: Insufficient documentation

## 2015-05-08 DIAGNOSIS — J0100 Acute maxillary sinusitis, unspecified: Secondary | ICD-10-CM | POA: Insufficient documentation

## 2015-05-08 LAB — URINALYSIS COMPLETE WITH MICROSCOPIC (ARMC ONLY)
Bilirubin Urine: NEGATIVE
Glucose, UA: NEGATIVE mg/dL
Hgb urine dipstick: NEGATIVE
Ketones, ur: NEGATIVE mg/dL
Nitrite: NEGATIVE
Protein, ur: NEGATIVE mg/dL
Specific Gravity, Urine: 1.018 (ref 1.005–1.030)
pH: 5 (ref 5.0–8.0)

## 2015-05-08 LAB — BASIC METABOLIC PANEL
Anion gap: 4 — ABNORMAL LOW (ref 5–15)
BUN: 8 mg/dL (ref 6–20)
CO2: 28 mmol/L (ref 22–32)
Calcium: 9.1 mg/dL (ref 8.9–10.3)
Chloride: 108 mmol/L (ref 101–111)
Creatinine, Ser: 0.74 mg/dL (ref 0.44–1.00)
GFR calc Af Amer: >60 mL/min (ref >60)
GFR calc non Af Amer: >60 mL/min (ref >60)
Glucose, Bld: 85 mg/dL (ref 65–99)
Potassium: 3.8 mmol/L (ref 3.5–5.1)
Sodium: 140 mmol/L (ref 135–145)

## 2015-05-08 LAB — CBC WITH DIFFERENTIAL/PLATELET
Basophils Absolute: 0 10*3/uL (ref 0–0.1)
Basophils Relative: 0 %
Eosinophils Absolute: 0.1 10*3/uL (ref 0–0.7)
Eosinophils Relative: 2 %
HCT: 37.8 % (ref 35.0–47.0)
Hemoglobin: 12.6 g/dL (ref 12.0–16.0)
Lymphocytes Relative: 33 %
Lymphs Abs: 1.8 10*3/uL (ref 1.0–3.6)
MCH: 31.2 pg (ref 26.0–34.0)
MCHC: 33.3 g/dL (ref 32.0–36.0)
MCV: 93.7 fL (ref 80.0–100.0)
Monocytes Absolute: 0.4 10*3/uL (ref 0.2–0.9)
Monocytes Relative: 8 %
Neutro Abs: 3.1 10*3/uL (ref 1.4–6.5)
Neutrophils Relative %: 57 %
Platelets: 282 10*3/uL (ref 150–440)
RBC: 4.04 MIL/uL (ref 3.80–5.20)
RDW: 12.9 % (ref 11.5–14.5)
WBC: 5.5 10*3/uL (ref 3.6–11.0)

## 2015-05-08 LAB — POCT RAPID STREP A: Streptococcus, Group A Screen (Direct): NEGATIVE

## 2015-05-08 MED ORDER — PSEUDOEPHEDRINE HCL 60 MG PO TABS: 60.0000 mg | ORAL_TABLET | ORAL | Status: DC | PRN

## 2015-05-08 MED ORDER — PROMETHAZINE HCL 25 MG PO TABS: 25.0000 mg | ORAL_TABLET | Freq: Four times a day (QID) | ORAL | Status: DC | PRN

## 2015-05-08 MED ORDER — AZITHROMYCIN 250 MG PO TABS: ORAL_TABLET | ORAL | Status: DC

## 2015-05-08 NOTE — Discharge Instructions (Signed)
Upper Respiratory Infection, Adult  Most upper respiratory infections (URIs) are a viral infection of the air passages leading to the lungs. A URI affects the nose, throat, and upper air passages. The most common type of URI is nasopharyngitis and is typically referred to as "the common cold."  URIs run their course and usually go away on their own. Most of the time, a URI does not require medical attention, but sometimes a bacterial infection in the upper airways can follow a viral infection. This is called a secondary infection. Sinus and middle ear infections are common types of secondary upper respiratory infections.  Bacterial pneumonia can also complicate a URI. A URI can worsen asthma and chronic obstructive pulmonary disease (COPD). Sometimes, these complications can require emergency medical care and may be life threatening.   CAUSES  Almost all URIs are caused by viruses. A virus is a type of germ and can spread from one person to another.   RISKS FACTORS  You may be at risk for a URI if:    You smoke.    You have chronic heart or lung disease.   You have a weakened defense (immune) system.    You are very young or very old.    You have nasal allergies or asthma.   You work in crowded or poorly ventilated areas.   You work in health care facilities or schools.  SIGNS AND SYMPTOMS   Symptoms typically develop 2-3 days after you come in contact with a cold virus. Most viral URIs last 7-10 days. However, viral URIs from the influenza virus (flu virus) can last 14-18 days and are typically more severe. Symptoms may include:    Runny or stuffy (congested) nose.    Sneezing.    Cough.    Sore throat.    Headache.    Fatigue.    Fever.    Loss of appetite.    Pain in your forehead, behind your eyes, and over your cheekbones (sinus pain).   Muscle aches.   DIAGNOSIS   Your health care provider may diagnose a URI by:   Physical exam.   Tests to check that your symptoms are not due to  another condition such as:   Strep throat.   Sinusitis.   Pneumonia.   Asthma.  TREATMENT   A URI goes away on its own with time. It cannot be cured with medicines, but medicines may be prescribed or recommended to relieve symptoms. Medicines may help:   Reduce your fever.   Reduce your cough.   Relieve nasal congestion.  HOME CARE INSTRUCTIONS    Take medicines only as directed by your health care provider.    Gargle warm saltwater or take cough drops to comfort your throat as directed by your health care provider.   Use a warm mist humidifier or inhale steam from a shower to increase air moisture. This may make it easier to breathe.   Drink enough fluid to keep your urine clear or pale yellow.    Eat soups and other clear broths and maintain good nutrition.    Rest as needed.    Return to work when your temperature has returned to normal or as your health care provider advises. You may need to stay home longer to avoid infecting others. You can also use a face mask and careful hand washing to prevent spread of the virus.   Increase the usage of your inhaler if you have asthma.    Do not   use any tobacco products, including cigarettes, chewing tobacco, or electronic cigarettes. If you need help quitting, ask your health care provider.  PREVENTION   The best way to protect yourself from getting a cold is to practice good hygiene.    Avoid oral or hand contact with people with cold symptoms.    Wash your hands often if contact occurs.   There is no clear evidence that vitamin C, vitamin E, echinacea, or exercise reduces the chance of developing a cold. However, it is always recommended to get plenty of rest, exercise, and practice good nutrition.   SEEK MEDICAL CARE IF:    You are getting worse rather than better.    Your symptoms are not controlled by medicine.    You have chills.   You have worsening shortness of breath.   You have brown or red mucus.   You have yellow or brown nasal  discharge.   You have pain in your face, especially when you bend forward.   You have a fever.   You have swollen neck glands.   You have pain while swallowing.   You have white areas in the back of your throat.  SEEK IMMEDIATE MEDICAL CARE IF:    You have severe or persistent:    Headache.    Ear pain.    Sinus pain.    Chest pain.   You have chronic lung disease and any of the following:    Wheezing.    Prolonged cough.    Coughing up blood.    A change in your usual mucus.   You have a stiff neck.   You have changes in your:    Vision.    Hearing.    Thinking.    Mood.  MAKE SURE YOU:    Understand these instructions.   Will watch your condition.   Will get help right away if you are not doing well or get worse.     This information is not intended to replace advice given to you by your health care provider. Make sure you discuss any questions you have with your health care provider.     Document Released: 01/12/2001 Document Revised: 12/03/2014 Document Reviewed: 10/24/2013  Elsevier Interactive Patient Education 2016 [employer]      Sinusitis, Adult  Sinusitis is redness, soreness, and inflammation of the paranasal sinuses. Paranasal sinuses are air pockets within the bones of your face. They are located beneath your eyes, in the middle of your forehead, and above your eyes. In healthy paranasal sinuses, mucus is able to drain out, and air is able to circulate through them by way of your nose. However, when your paranasal sinuses are inflamed, mucus and air can become trapped. This can allow bacteria and other germs to grow and cause infection.  Sinusitis can develop quickly and last only a short time (acute) or continue over a long period (chronic). Sinusitis that lasts for more than 12 weeks is considered chronic.  CAUSES  Causes of sinusitis include:   Allergies.   Structural abnormalities, such as displacement of the cartilage that separates your nostrils (deviated septum), which can  decrease the air flow through your nose and sinuses and affect sinus drainage.   Functional abnormalities, such as when the small hairs (cilia) that line your sinuses and help remove mucus do not work properly or are not present.  SIGNS AND SYMPTOMS  Symptoms of acute and chronic sinusitis are the same. The primary symptoms are pain and   pressure around the affected sinuses. Other symptoms include:   Upper toothache.   Earache.   Headache.   Bad breath.   Decreased sense of smell and taste.   A cough, which worsens when you are lying flat.   Fatigue.   Fever.   Thick drainage from your nose, which often is green and may contain pus (purulent).   Swelling and warmth over the affected sinuses.  DIAGNOSIS  Your health care provider will perform a physical exam. During your exam, your health care provider may perform any of the following to help determine if you have acute sinusitis or chronic sinusitis:   Look in your nose for signs of abnormal growths in your nostrils (nasal polyps).   Tap over the affected sinus to check for signs of infection.   View the inside of your sinuses using an imaging device that has a light attached (endoscope).  If your health care provider suspects that you have chronic sinusitis, one or more of the following tests may be recommended:   Allergy tests.   Nasal culture. A sample of mucus is taken from your nose, sent to a lab, and screened for bacteria.   Nasal cytology. A sample of mucus is taken from your nose and examined by your health care provider to determine if your sinusitis is related to an allergy.  TREATMENT  Most cases of acute sinusitis are related to a viral infection and will resolve on their own within 10 days. Sometimes, medicines are prescribed to help relieve symptoms of both acute and chronic sinusitis. These may include pain medicines, decongestants, nasal steroid sprays, or saline sprays.  However, for sinusitis related to a bacterial infection, your  health care provider will prescribe antibiotic medicines. These are medicines that will help kill the bacteria causing the infection.  Rarely, sinusitis is caused by a fungal infection. In these cases, your health care provider will prescribe antifungal medicine.  For some cases of chronic sinusitis, surgery is needed. Generally, these are cases in which sinusitis recurs more than 3 times per year, despite other treatments.  HOME CARE INSTRUCTIONS   Drink plenty of water. Water helps thin the mucus so your sinuses can drain more easily.   Use a humidifier.   Inhale steam 3-4 times a day (for example, sit in the bathroom with the shower running).   Apply a warm, moist washcloth to your face 3-4 times a day, or as directed by your health care provider.   Use saline nasal sprays to help moisten and clean your sinuses.   Take medicines only as directed by your health care provider.   If you were prescribed either an antibiotic or antifungal medicine, finish it all even if you start to feel better.  SEEK IMMEDIATE MEDICAL CARE IF:   You have increasing pain or severe headaches.   You have nausea, vomiting, or drowsiness.   You have swelling around your face.   You have vision problems.   You have a stiff neck.   You have difficulty breathing.     This information is not intended to replace advice given to you by your health care provider. Make sure you discuss any questions you have with your health care provider.     Document Released: 07/19/2005 Document Revised: 08/09/2014 Document Reviewed: 08/03/2011  Elsevier Interactive Patient Education 2016 [employer]

## 2015-05-08 NOTE — ED Provider Notes (Signed)
[facility] Emergency Department Provider Note  ____________________________________________  Time seen: Approximately 8:40 AM  I have reviewed the triage vital signs and the nursing notes.   HISTORY  Chief Complaint Facial Pain    HPI Sabrina Brennan is a 24 y.o. female who presents for having left-sided sinus pressure since she was seen here 8 days ago. Patient states that she is on and one episode of vomiting since that time. Complains of a lot of sinus pressure and congestion in her head.Also complains of having a sore throat over the cough. She just feels dizzy lightheaded weak.   No past medical history on file.  Patient Active Problem List   Diagnosis Date Noted  . Breast mass in female 08/29/2013    No past surgical history on file.  Current Outpatient Rx  Name  Route  Sig  Dispense  Refill  . azithromycin (ZITHROMAX Z-PAK) 250 MG tablet      Take 2 tablets (500 mg) on  Day 1,  followed by 1 tablet (250 mg) once daily on Days 2 through 5.   6 each   0   . promethazine (PHENERGAN) 25 MG tablet   Oral   Take 1 tablet (25 mg total) by mouth every 6 (six) hours as needed for nausea or vomiting.   30 tablet   0   . pseudoephedrine (SUDAFED) 60 MG tablet   Oral   Take 1 tablet (60 mg total) by mouth every 4 (four) hours as needed for congestion.   24 tablet   0     Allergies Review of patient's allergies indicates no known allergies.  No family history on file.  Social History Social History  Substance Use Topics  . Smoking status: Never Smoker   . Smokeless tobacco: Never Used  . Alcohol Use: 1.0 oz/week    2 Standard drinks or equivalent per week    Review of Systems Constitutional: No fever/chills Eyes: No visual changes. ENT: Positive nasal congestion pressure and left side of her face sore throat Cardiovascular: Denies chest pain. Respiratory: Denies shortness of breath. Gastrointestinal: No abdominal pain.  Positive  for nausea and vomiting 1 today.  No diarrhea.  No constipation. Genitourinary: Negative for dysuria. Musculoskeletal: Negative for back pain. Skin: Negative for rash. Neurological: Negative for headaches, focal weakness or numbness.  10-point ROS otherwise negative.  ____________________________________________   PHYSICAL EXAM:  VITAL SIGNS: ED Triage Vitals  Enc Vitals Group     BP 05/08/15 0832 114/64 mmHg     Pulse Rate 05/08/15 0832 55     Resp 05/08/15 0832 16     Temp 05/08/15 0832 97.8 F (36.6 C)     Temp Source 05/08/15 0832 Oral     SpO2 05/08/15 0832 99 %     Weight 05/08/15 0832 120 lb (54.432 kg)     Height 05/08/15 0832  (1.499 m)     Head Cir --      Peak Flow --      Pain Score 05/08/15 0833 10     Pain Loc --      Pain Edu? --      Excl. in GC? --     Constitutional: Alert and oriented. Well appearing and in no acute distress. Eyes: Conjunctivae are normal. PERRL. EOMI. Head: Atraumatic. Nose: Minimal nasal congestion with maxillary and frontal sinus tenderness on the left side. Mouth/Throat: Mucous membranes are moist.  Oropharynx non-erythematous. Neck: No stridor.   Cardiovascular: Normal  rate, regular rhythm. Grossly normal heart sounds.  Good peripheral circulation. Respiratory: Normal respiratory effort.  No retractions. Lungs CTAB. Gastrointestinal: Soft and nontender. No distention. No abdominal bruits. No CVA tenderness. Musculoskeletal: No lower extremity tenderness nor edema.  No joint effusions. Neurologic:  Normal speech and language. No gross focal neurologic deficits are appreciated. No gait instability. Skin:  Skin is warm, dry and intact. No rash noted. Psychiatric: Mood and affect are normal. Speech and behavior are normal.  ____________________________________________   LABS (all labs ordered are listed, but only abnormal results are displayed)  Labs Reviewed  BASIC METABOLIC PANEL - Abnormal; Notable for the following:     Anion gap 4 (*)    All other components within normal limits  URINALYSIS COMPLETEWITH MICROSCOPIC ([facility] ONLY) - Abnormal; Notable for the following:    Color, Urine YELLOW (*)    APPearance HAZY (*)    Leukocytes, UA TRACE (*)    Bacteria, UA RARE (*)    Squamous Epithelial / LPF 0-5 (*)    All other components within normal limits  CBC WITH DIFFERENTIAL/PLATELET  POCT RAPID STREP A   ____________________________________________   PROCEDURES  Procedure(s) performed: None  Critical Care performed: No  ____________________________________________   INITIAL IMPRESSION / ASSESSMENT AND PLAN / ED COURSE  Pertinent labs & imaging results that were available during my care of the patient were reviewed by me and considered in my medical decision making (see chart for details).  Acute upper strep infection/sinusitis Rx given for Z-Pak, guaifenesin, Sudafed, and Phenergan for nausea. Patient follow-up with PCP or return to the ER with any worsening symptomology. ____________________________________________   FINAL CLINICAL IMPRESSION(S) / ED DIAGNOSES  Final diagnoses:  Acute maxillary sinusitis, recurrence not specified  URI, acute      Evangeline Dakin, PA-C 05/08/15 1045  Richardean Canal, MD 05/08/15 [phone]

## 2015-05-08 NOTE — ED Notes (Signed)
Pt reports that she has been having left sided sinus pressure since she was last seen here and diagnosed with a virus. Pt reports that she has had one episode of vomiting.

## 2015-05-27 ENCOUNTER — Emergency Department
Admission: EM | Admit: 2015-05-27 | Discharge: 2015-05-27 | Disposition: A | Discharge status: Home / Self Care | Payer: 59 | Attending: Student | Admitting: Student

## 2015-05-27 ENCOUNTER — Encounter: Payer: Self-pay | Admitting: Emergency Medicine

## 2015-05-27 DIAGNOSIS — J302 Other seasonal allergic rhinitis: Secondary | ICD-10-CM | POA: Insufficient documentation

## 2015-05-27 DIAGNOSIS — Z792 Long term (current) use of antibiotics: Secondary | ICD-10-CM | POA: Insufficient documentation

## 2015-05-27 MED ORDER — FLUTICASONE PROPIONATE 50 MCG/ACT NA SUSP: 2.0000 | Freq: Every day | NASAL | Status: DC

## 2015-05-27 MED ORDER — FEXOFENADINE-PSEUDOEPHED ER 60-120 MG PO TB12: 1.0000 | ORAL_TABLET | Freq: Two times a day (BID) | ORAL | Status: DC

## 2015-05-27 MED ORDER — KETOROLAC TROMETHAMINE 60 MG/2ML IM SOLN
60.0000 mg | Freq: Once | INTRAMUSCULAR | Status: AC
  Administered 2015-05-27: 60 mg via INTRAMUSCULAR
  Filled 2015-05-27: qty 2

## 2015-05-27 NOTE — ED Notes (Signed)
Pt was seen in ER 3 weeks ago for sinus infection, treated with antibiotics. Pt here today with C/O of runny nose, nasal congestion, and painful face.

## 2015-05-27 NOTE — Discharge Instructions (Signed)
Allergic Rhinitis Allergic rhinitis is when the mucous membranes in the nose respond to allergens. Allergens are particles in the air that cause your body to have an allergic reaction. This causes you to release allergic antibodies. Through a chain of events, these eventually cause you to release histamine into the blood stream. Although meant to protect the body, it is this release of histamine that causes your discomfort, such as frequent sneezing, congestion, and an itchy, runny nose.  CAUSES Seasonal allergic rhinitis (hay fever) is caused by pollen allergens that may come from grasses, trees, and weeds. Year-round allergic rhinitis (perennial allergic rhinitis) is caused by allergens such as house dust mites, pet dander, and mold spores. SYMPTOMS  Nasal stuffiness (congestion).  Itchy, runny nose with sneezing and tearing of the eyes. DIAGNOSIS Your health care provider can help you determine the allergen or allergens that trigger your symptoms. If you and your health care provider are unable to determine the allergen, skin or blood testing may be used. Your health care provider will diagnose your condition after taking your health history and performing a physical exam. Your health care provider may assess you for other related conditions, such as asthma, pink eye, or an ear infection. TREATMENT Allergic rhinitis does not have a cure, but it can be controlled by:  Medicines that block allergy symptoms. These may include allergy shots, nasal sprays, and oral antihistamines.  Avoiding the allergen. Hay fever may often be treated with antihistamines in pill or nasal spray forms. Antihistamines block the effects of histamine. There are over-the-counter medicines that may help with nasal congestion and swelling around the eyes. Check with your health care provider before taking or giving this medicine. If avoiding the allergen or the medicine prescribed do not work, there are many new medicines  your health care provider can prescribe. Stronger medicine may be used if initial measures are ineffective. Desensitizing injections can be used if medicine and avoidance does not work. Desensitization is when a patient is given ongoing shots until the body becomes less sensitive to the allergen. Make sure you follow up with your health care provider if problems continue. HOME CARE INSTRUCTIONS It is not possible to completely avoid allergens, but you can reduce your symptoms by taking steps to limit your exposure to them. It helps to know exactly what you are allergic to so that you can avoid your specific triggers. SEEK MEDICAL CARE IF:  You have a fever.  You develop a cough that does not stop easily (persistent).  You have shortness of breath.  You start wheezing.  Symptoms interfere with normal daily activities.   This information is not intended to replace advice given to you by your health care provider. Make sure you discuss any questions you have with your health care provider.   Document Released: 04/13/2001 Document Revised: 08/09/2014 Document Reviewed: 03/26/2013 Elsevier Interactive Patient Education 2016 [employer]  

## 2015-05-27 NOTE — ED Notes (Signed)
States she is having some sinus pressure and congestion for the past 2-3 weeks

## 2015-05-27 NOTE — ED Provider Notes (Signed)
[facility] Emergency Department Provider Note  ____________________________________________  Time seen: Approximately 9:57 AM  I have reviewed the triage vital signs and the nursing notes.   HISTORY  Chief Complaint Nasal Congestion    HPI Sabrina Brennan is a 24 y.o. female patient complaining of facial pressure runny nose and frontal headache. Patient states she was recently treated for sinus infection. Patient stated there wasn't feeling better until she started cutting grass 4 days ago. Patient denies any fever associated this complaint. Patient denies any nausea vomiting diarrhea. Patient states she is over-the-counter preparations of decongestions for any relief. Patient believes she is abusing or overuse and ibuprofen. Patient rated her pain discomfort as a 10 over 10. Patient is recently relocated to this area.   History reviewed. No pertinent past medical history.  Patient Active Problem List   Diagnosis Date Noted  . Breast mass in female 08/29/2013    History reviewed. No pertinent past surgical history.  Current Outpatient Rx  Name  Route  Sig  Dispense  Refill  . azithromycin (ZITHROMAX Z-PAK) 250 MG tablet      Take 2 tablets (500 mg) on  Day 1,  followed by 1 tablet (250 mg) once daily on Days 2 through 5.   6 each   0   . fexofenadine-pseudoephedrine (ALLEGRA-D) 60-120 MG 12 hr tablet   Oral   Take 1 tablet by mouth 2 (two) times daily.   20 tablet   0   . fluticasone (FLONASE) 50 MCG/ACT nasal spray   Each Nare   Place 2 sprays into both nostrils daily.   16 g   2   . promethazine (PHENERGAN) 25 MG tablet   Oral   Take 1 tablet (25 mg total) by mouth every 6 (six) hours as needed for nausea or vomiting.   30 tablet   0   . pseudoephedrine (SUDAFED) 60 MG tablet   Oral   Take 1 tablet (60 mg total) by mouth every 4 (four) hours as needed for congestion.   24 tablet   0     Allergies Review of patient's allergies  indicates no known allergies.  History reviewed. No pertinent family history.  Social History Social History  Substance Use Topics  . Smoking status: Never Smoker   . Smokeless tobacco: Never Used  . Alcohol Use: 1.0 oz/week    2 Standard drinks or equivalent per week    Review of Systems Constitutional: No fever/chills Eyes: No visual changes. ENT: No sore throat. Nasal congestion runny nose Cardiovascular: Denies chest pain. Respiratory: Denies shortness of breath. Gastrointestinal: No abdominal pain.  No nausea, no vomiting.  No diarrhea.  No constipation. Genitourinary: Negative for dysuria. Musculoskeletal: Negative for back pain. Skin: Negative for rash. Neurological: Negative for headaches, focal weakness or numbness. 10-point ROS otherwise negative.  ____________________________________________   PHYSICAL EXAM:  VITAL SIGNS: ED Triage Vitals  Enc Vitals Group     BP 05/27/15 0932 110/72 mmHg     Pulse Rate 05/27/15 0932 64     Resp 05/27/15 0932 14     Temp 05/27/15 0932 98.2 F (36.8 C)     Temp Source 05/27/15 0932 Oral     SpO2 05/27/15 0932 100 %     Weight 05/27/15 0932 115 lb (52.164 kg)     Height 05/27/15 0932 4\' 11"  (1.499 m)     Head Cir --      Peak Flow --  Pain Score 05/27/15 0932 10     Pain Loc --      Pain Edu? --      Excl. in GC? --     Constitutional: Alert and oriented. Well appearing and in no acute distress. Eyes: Conjunctivae are normal. PERRL. EOMI. Head: Atraumatic. Nose: Bilateral maxillary guarding. Edematous nasal turbinates. Clear rhinorrhea. Postnasal drainage. Mouth/Throat: Mucous membranes are moist.  Oropharynx non-erythematous. Neck: No stridor. No cervical spine tenderness to palpation Hematological/Lymphatic/Immunilogical: No cervical lymphadenopathy. Cardiovascular: Normal rate, regular rhythm. Grossly normal heart sounds.  Good peripheral circulation. Respiratory: Normal respiratory effort.  No retractions.  Lungs CTAB. Gastrointestinal: Soft and nontender. No distention. No abdominal bruits. No CVA tenderness. Musculoskeletal: No lower extremity tenderness nor edema.  No joint effusions. Neurologic:  Normal speech and language. No gross focal neurologic deficits are appreciated. No gait instability. Skin:  Skin is warm, dry and intact. No rash noted. Psychiatric: Mood and affect are normal. Speech and behavior are normal.  ____________________________________________   LABS (all labs ordered are listed, but only abnormal results are displayed)  Labs Reviewed - No data to display ____________________________________________  EKG   ____________________________________________  RADIOLOGY   ____________________________________________   PROCEDURES  Procedure(s) performed: None  Critical Care performed: No  ____________________________________________   INITIAL IMPRESSION / ASSESSMENT AND PLAN / ED COURSE  Pertinent labs & imaging results that were available during my care of the patient were reviewed by me and considered in my medical decision making (see chart for details).  Allergic rhinitis. Patient get a prescription for Flonase and Allegra-D. Patient advised to follow-up with family doctor for continued care. ____________________________________________   FINAL CLINICAL IMPRESSION(S) / ED DIAGNOSES  Final diagnoses:  Other seasonal allergic rhinitis      Joni Reining, PA-C 05/27/15 1008  Gayla Doss, MD 05/27/15 [phone]

## 2015-06-17 IMAGING — CR DG CHEST 2V
2 series · 2 of 2 positions shown · non-contrast
Comparison: None.

CLINICAL DATA: Left-sided chest pain extending down the left arm.
Cough and laryngitis.

EXAM:
CHEST  2 VIEW

[chest pa]
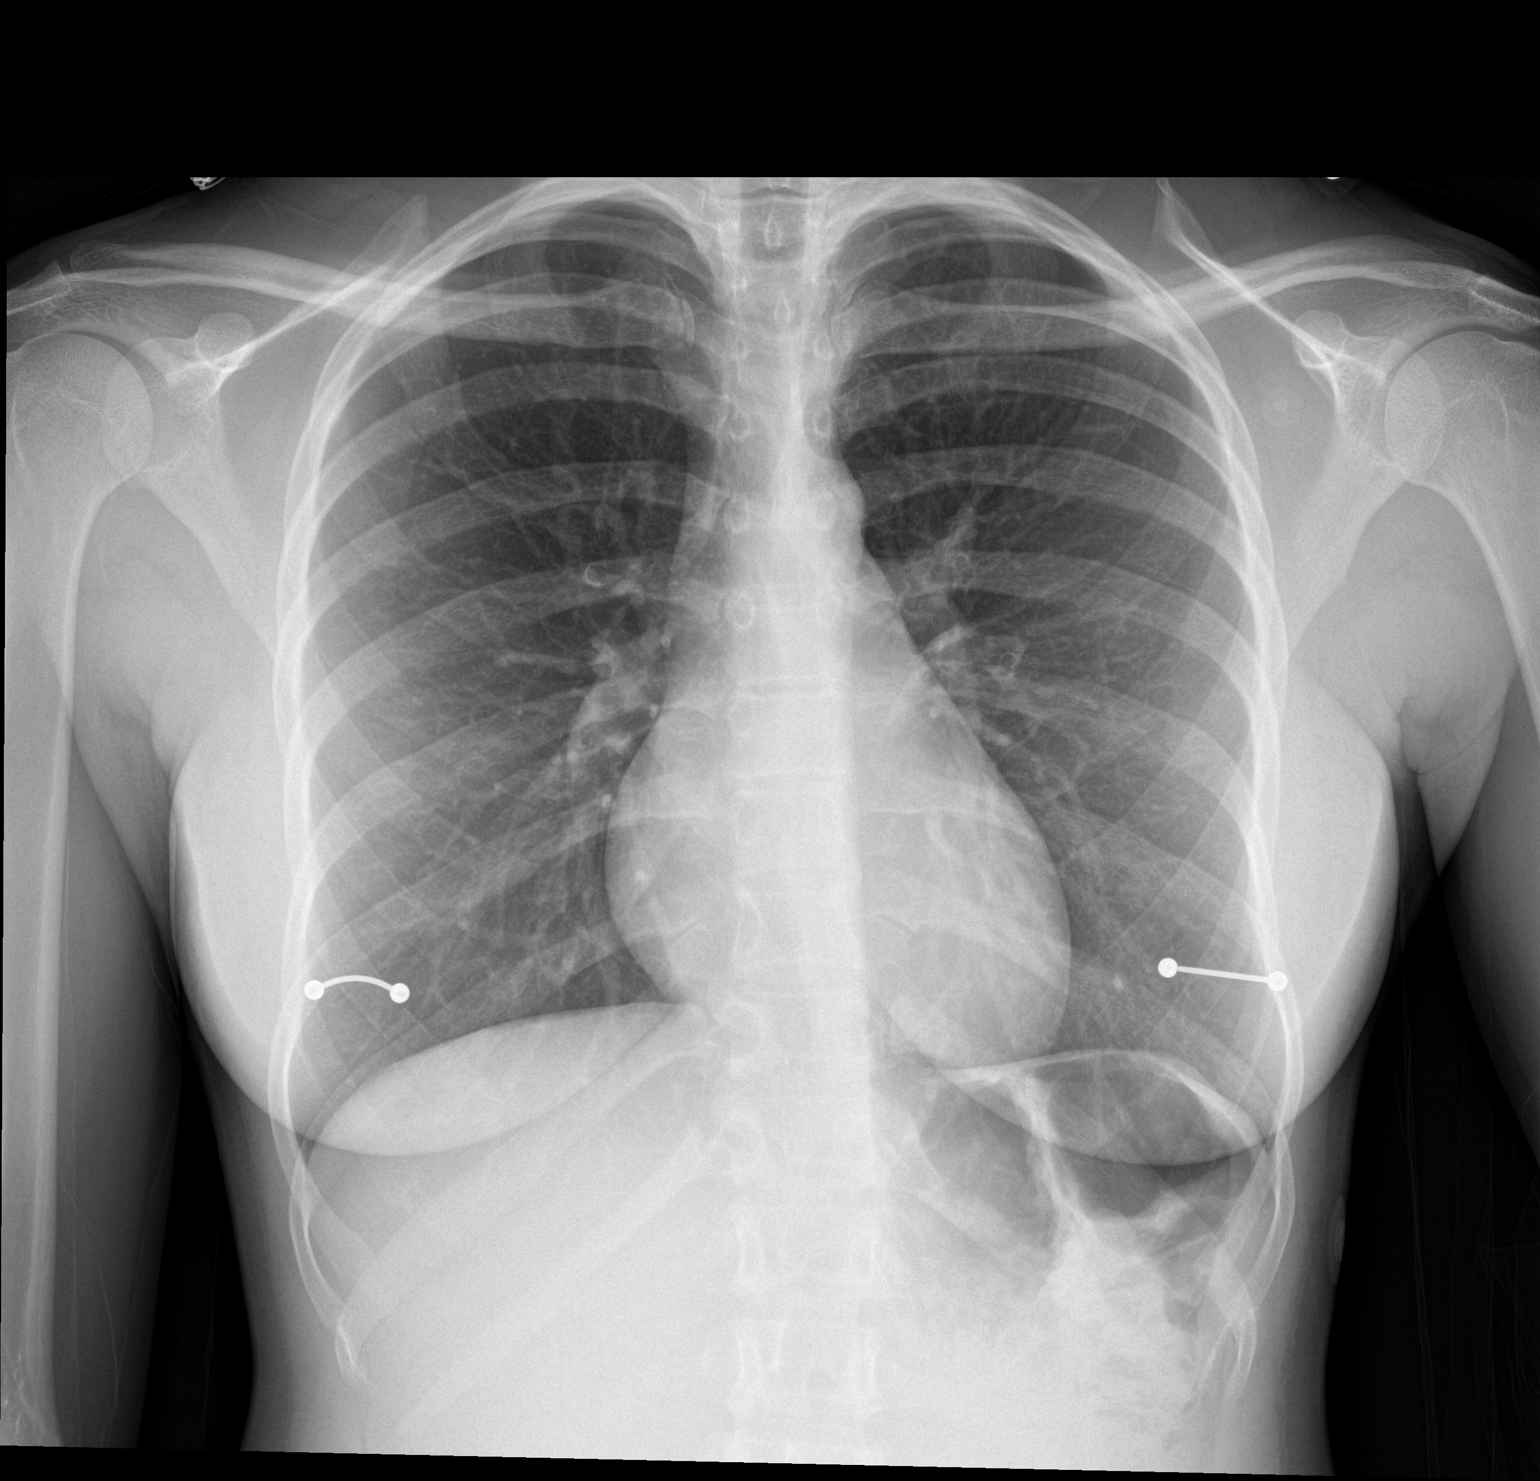

[chest lat]
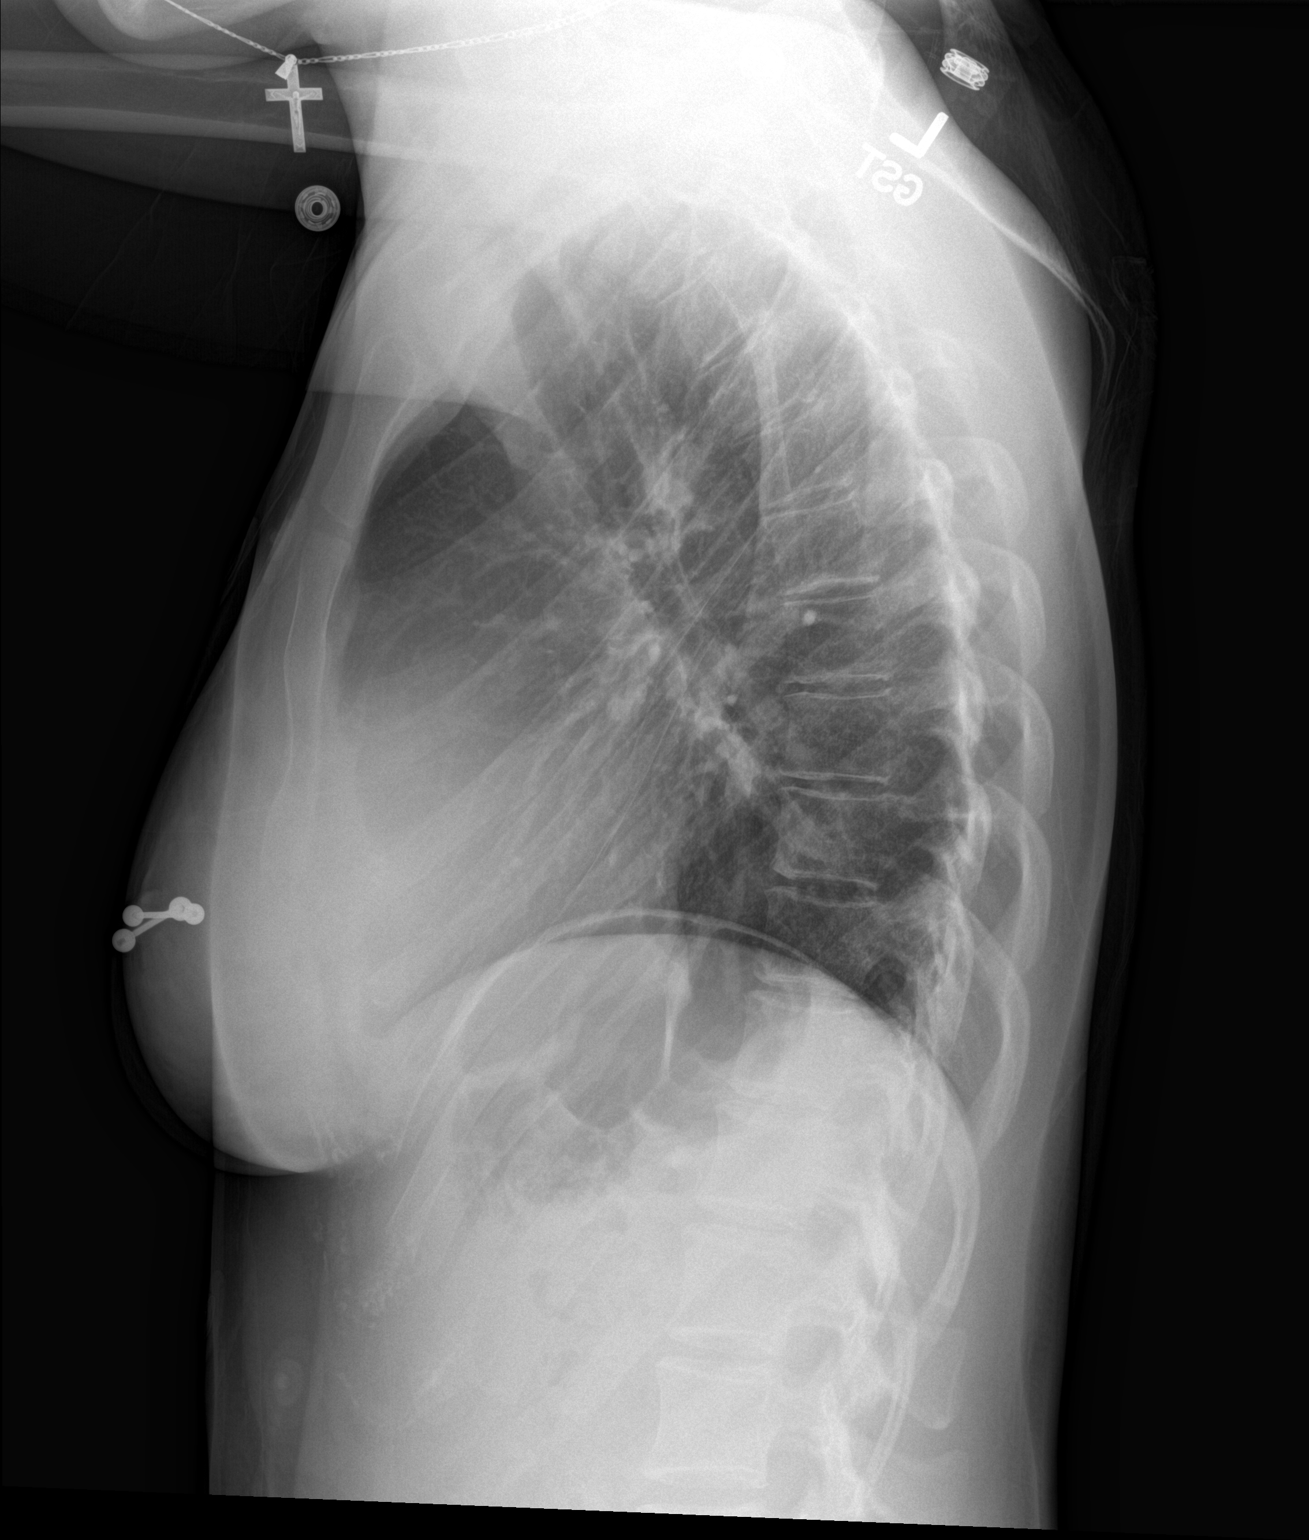

[2 of 2 positions shown; findings below may reference images not displayed]

FINDINGS: The heart size and mediastinal contours are within normal limits.
Both lungs are clear. The visualized skeletal structures are
unremarkable. Metallic piercings over the nipples.
IMPRESSION: No active cardiopulmonary disease.

## 2015-06-24 ENCOUNTER — Emergency Department
Admission: EM | Admit: 2015-06-24 | Discharge: 2015-06-24 | Disposition: A | Discharge status: Home / Self Care | Payer: 59 | Attending: Emergency Medicine | Admitting: Emergency Medicine

## 2015-06-24 ENCOUNTER — Encounter: Payer: Self-pay | Admitting: Emergency Medicine

## 2015-06-24 DIAGNOSIS — Z792 Long term (current) use of antibiotics: Secondary | ICD-10-CM | POA: Insufficient documentation

## 2015-06-24 DIAGNOSIS — R51 Headache: Secondary | ICD-10-CM

## 2015-06-24 DIAGNOSIS — R519 Headache, unspecified: Secondary | ICD-10-CM

## 2015-06-24 DIAGNOSIS — J0101 Acute recurrent maxillary sinusitis: Secondary | ICD-10-CM | POA: Insufficient documentation

## 2015-06-24 MED ORDER — BUTALBITAL-APAP-CAFFEINE 50-325-40 MG PO TABS: 1.0000 | ORAL_TABLET | Freq: Four times a day (QID) | ORAL | Status: DC | PRN

## 2015-06-24 MED ORDER — FEXOFENADINE-PSEUDOEPHED ER 60-120 MG PO TB12: 1.0000 | ORAL_TABLET | Freq: Two times a day (BID) | ORAL | Status: DC

## 2015-06-24 MED ORDER — FLUTICASONE PROPIONATE 50 MCG/ACT NA SUSP: 2.0000 | Freq: Every day | NASAL | Status: DC

## 2015-06-24 MED ORDER — SULFAMETHOXAZOLE-TRIMETHOPRIM 800-160 MG PO TABS: 1.0000 | ORAL_TABLET | Freq: Two times a day (BID) | ORAL | Status: DC

## 2015-06-24 NOTE — ED Provider Notes (Signed)
[facility] Emergency Department Provider Note  ____________________________________________  Time seen: Approximately 1:08 PM  I have reviewed the triage vital signs and the nursing notes.   HISTORY  Chief Complaint Nasal Congestion    HPI Sabrina Brennan is a 24 y.o. female complaining that his congestion and frontal headache for 5 days. Patient had recurrent allergic rhinitis and sinusitis for the past 2 months. Patient denies any fever, mild nausea secondary postnasal drainage but no vomiting or diarrhea. Patient states she is finished a course of medication from her previous visits. Patient stated no palliative measures taken forthis complaint. Patient rates the pain as a 10 over 10. Describes the pain as pressure.   History reviewed. No pertinent past medical history.  Patient Active Problem List   Diagnosis Date Noted  . Breast mass in female 08/29/2013    History reviewed. No pertinent past surgical history.  Current Outpatient Rx  Name  Route  Sig  Dispense  Refill  . azithromycin (ZITHROMAX Z-PAK) 250 MG tablet      Take 2 tablets (500 mg) on  Day 1,  followed by 1 tablet (250 mg) once daily on Days 2 through 5.   6 each   0   . fexofenadine-pseudoephedrine (ALLEGRA-D) 60-120 MG 12 hr tablet   Oral   Take 1 tablet by mouth 2 (two) times daily.   20 tablet   0   . fluticasone (FLONASE) 50 MCG/ACT nasal spray   Each Nare   Place 2 sprays into both nostrils daily.   16 g   2   . promethazine (PHENERGAN) 25 MG tablet   Oral   Take 1 tablet (25 mg total) by mouth every 6 (six) hours as needed for nausea or vomiting.   30 tablet   0   . pseudoephedrine (SUDAFED) 60 MG tablet   Oral   Take 1 tablet (60 mg total) by mouth every 4 (four) hours as needed for congestion.   24 tablet   0     Allergies Review of patient's allergies indicates no known allergies.  No family history on file.  Social History Social History   Substance Use Topics  . Smoking status: Never Smoker   . Smokeless tobacco: Never Used  . Alcohol Use: 1.0 oz/week    2 Standard drinks or equivalent per week    Review of Systems Constitutional: No fever/chills Eyes: No visual changes. ENT: No sore throat. Sinus congestion Cardiovascular: Denies chest pain. Respiratory: Denies shortness of breath. Gastrointestinal: No abdominal pain.  No nausea, no vomiting.  No diarrhea.  No constipation. Genitourinary: Negative for dysuria. Musculoskeletal: Negative for back pain. Skin: Negative for rash. Neurological: Positive for frontal headaches, but denies focal weakness or numbness. 10-point ROS otherwise negative.  ____________________________________________   PHYSICAL EXAM:  VITAL SIGNS: ED Triage Vitals  Enc Vitals Group     BP 06/24/15 1232 110/68 mmHg     Pulse Rate 06/24/15 1232 74     Resp 06/24/15 1232 18     Temp 06/24/15 1232 98.1 F (36.7 C)     Temp Source 06/24/15 1232 Oral     SpO2 06/24/15 1232 99 %     Weight 06/24/15 1232 115 lb (52.164 kg)     Height 06/24/15 1232 4\' 11"  (1.499 m)     Head Cir --      Peak Flow --      Pain Score 06/24/15 1246 10     Pain Loc --  Pain Edu? --      Excl. in GC? --     Constitutional: Alert and oriented. Well appearing and in no acute distress. Eyes: Conjunctivae are normal. PERRL. EOMI. Head: Atraumatic. Nose: Edematous nasal turbinates with thick nasal discharge.  Mouth/Throat: Mucous membranes are moist.  Oropharynx non-erythematous. Postnasal discharge Neck: No stridor.  No cervical spine tenderness to palpation. Hematological/Lymphatic/Immunilogical: No cervical lymphadenopathy. Cardiovascular: Normal rate, regular rhythm. Grossly normal heart sounds.  Good peripheral circulation. Respiratory: Normal respiratory effort.  No retractions. Lungs CTAB. Gastrointestinal: Soft and nontender. No distention. No abdominal bruits. No CVA tenderness. Musculoskeletal: No  lower extremity tenderness nor edema.  No joint effusions. Neurologic:  Normal speech and language. No gross focal neurologic deficits are appreciated. No gait instability. Skin:  Skin is warm, dry and intact. No rash noted. Psychiatric: Mood and affect are normal. Speech and behavior are normal.  ____________________________________________   LABS (all labs ordered are listed, but only abnormal results are displayed)  Labs Reviewed - No data to display ____________________________________________  EKG   ____________________________________________  RADIOLOGY   ____________________________________________   PROCEDURES  Procedure(s) performed: None  Critical Care performed: No  ____________________________________________   INITIAL IMPRESSION / ASSESSMENT AND PLAN / ED COURSE  Pertinent labs & imaging results that were available during my care of the patient were reviewed by me and considered in my medical decision making (see chart for details).  Sinusitis with sinus headache. She will be given a prescription for Allegra-D, esgic, amoxicillin, and Flonase. Patient advised to follow-up with [facility] clinic for continued care. ____________________________________________   FINAL CLINICAL IMPRESSION(S) / ED DIAGNOSES  Final diagnoses:  Acute recurrent maxillary sinusitis  Sinus headache      Joni Reining, PA-C 06/24/15 1325  Emily Filbert, MD 06/24/15 [phone]

## 2015-06-24 NOTE — ED Notes (Signed)
C/o nasal congestion and headache

## 2015-09-07 ENCOUNTER — Emergency Department: Payer: Self-pay

## 2015-09-07 ENCOUNTER — Emergency Department
Admission: EM | Admit: 2015-09-07 | Discharge: 2015-09-07 | Disposition: A | Discharge status: Home / Self Care | Payer: Self-pay | Attending: Emergency Medicine | Admitting: Emergency Medicine

## 2015-09-07 ENCOUNTER — Encounter: Payer: Self-pay | Admitting: Emergency Medicine

## 2015-09-07 DIAGNOSIS — J069 Acute upper respiratory infection, unspecified: Secondary | ICD-10-CM | POA: Insufficient documentation

## 2015-09-07 DIAGNOSIS — Z79899 Other long term (current) drug therapy: Secondary | ICD-10-CM | POA: Insufficient documentation

## 2015-09-07 DIAGNOSIS — H578 Other specified disorders of eye and adnexa: Secondary | ICD-10-CM | POA: Insufficient documentation

## 2015-09-07 DIAGNOSIS — Z792 Long term (current) use of antibiotics: Secondary | ICD-10-CM | POA: Insufficient documentation

## 2015-09-07 LAB — RAPID INFLUENZA A&B ANTIGENS
Influenza A (ARMC): NOT DETECTED
Influenza B (ARMC): NOT DETECTED

## 2015-09-07 NOTE — ED Provider Notes (Signed)
[facility] Emergency Department Provider Note  ____________________________________________  Time seen: On arrival  I have reviewed the triage vital signs and the nursing notes.   HISTORY  Chief Complaint Cough; Fever; Generalized Body Aches; and Hoarse    HPI Maryse A Dearing is a 25 y.o. female who presents with complaints of runny nose, fevers, body aches and cough for approximately 6 days. She also reports that her right eye is bloodshot is not painful. She does not remember if she had a flu shot or not. She denies shortness of breath. No recent travel.    History reviewed. No pertinent past medical history.  Patient Active Problem List   Diagnosis Date Noted  . Breast mass in female 08/29/2013    History reviewed. No pertinent past surgical history.  Current Outpatient Rx  Name  Route  Sig  Dispense  Refill  . azithromycin (ZITHROMAX Z-PAK) 250 MG tablet      Take 2 tablets (500 mg) on  Day 1,  followed by 1 tablet (250 mg) once daily on Days 2 through 5.   6 each   0   . butalbital-acetaminophen-caffeine (FIORICET) 50-325-40 MG tablet   Oral   Take 1-2 tablets by mouth every 6 (six) hours as needed for headache.   20 tablet   0   . fexofenadine-pseudoephedrine (ALLEGRA-D) 60-120 MG 12 hr tablet   Oral   Take 1 tablet by mouth 2 (two) times daily.   20 tablet   0   . fexofenadine-pseudoephedrine (ALLEGRA-D) 60-120 MG 12 hr tablet   Oral   Take 1 tablet by mouth 2 (two) times daily.   30 tablet   2   . fluticasone (FLONASE) 50 MCG/ACT nasal spray   Each Nare   Place 2 sprays into both nostrils daily.   16 g   2   . fluticasone (FLONASE) 50 MCG/ACT nasal spray   Each Nare   Place 2 sprays into both nostrils daily.   16 g   2   . promethazine (PHENERGAN) 25 MG tablet   Oral   Take 1 tablet (25 mg total) by mouth every 6 (six) hours as needed for nausea or vomiting.   30 tablet   0   . pseudoephedrine (SUDAFED) 60 MG  tablet   Oral   Take 1 tablet (60 mg total) by mouth every 4 (four) hours as needed for congestion.   24 tablet   0   . sulfamethoxazole-trimethoprim (BACTRIM DS,SEPTRA DS) 800-160 MG tablet   Oral   Take 1 tablet by mouth 2 (two) times daily.   20 tablet   0     Allergies Review of patient's allergies indicates no known allergies.  History reviewed. No pertinent family history.  Social History Social History  Substance Use Topics  . Smoking status: Never Smoker   . Smokeless tobacco: Never Used  . Alcohol Use: 1.0 oz/week    2 Standard drinks or equivalent per week    Review of Systems  Constitutional: Positive for fevers, subjective Eyes: Positive for bloodshot eye ENT: Negative for sore throat, positive for runny nose Respiratory: Positive for cough  Genitourinary: Negative for dysuria. Musculoskeletal: Negative for back pain. Positive for body aches Skin: Negative for rash. Neurological: Negative for headaches or focal weakness   ____________________________________________   PHYSICAL EXAM:  VITAL SIGNS: ED Triage Vitals  Enc Vitals Group     BP 09/07/15 0616 114/85 mmHg     Pulse Rate 09/07/15 0616  93     Resp 09/07/15 0616 18     Temp 09/07/15 0616 98.4 F (36.9 C)     Temp Source 09/07/15 0616 Oral     SpO2 09/07/15 0616 100 %     Weight 09/07/15 0616 115 lb (52.164 kg)     Height 09/07/15 0616  (1.473 m)     Head Cir --      Peak Flow --      Pain Score 09/07/15 0617 10     Pain Loc --      Pain Edu? --      Excl. in GC? --      Constitutional: Alert and oriented. Well appearing and in no distress. Eyes: Right eye with mild conjunctival hemorrhage ENT   Head: Normocephalic and atraumatic.   Mouth/Throat: Mucous membranes are moist. Cardiovascular: Normal rate, regular rhythm.  Respiratory: Normal respiratory effort without tachypnea nor retractions. Clear to auscultation Gastrointestinal: Soft and non-tender in all  quadrants. No distention. There is no CVA tenderness. Musculoskeletal: Nontender with normal range of motion in all extremities. Neurologic:  Normal speech and language. No gross focal neurologic deficits are appreciated. Skin:  Skin is warm, dry and intact. No rash noted. Psychiatric: Mood and affect are normal. Patient exhibits appropriate insight and judgment.  ____________________________________________    LABS (pertinent positives/negatives)  Labs Reviewed - No data to display  ____________________________________________     ____________________________________________    RADIOLOGY I have personally reviewed any xrays that were ordered on this patient: Chest x-ray unremarkable  ____________________________________________   PROCEDURES  Procedure(s) performed: none   ____________________________________________   INITIAL IMPRESSION / ASSESSMENT AND PLAN / ED COURSE  Pertinent labs & imaging results that were available during my care of the patient were reviewed by me and considered in my medical decision making (see chart for details).  Patient well-appearing no distress. Her vital signs are normal. Her exam is benign. We'll check an influenza swab and chest x-ray  Flu swab negative and chest x-ray remarkable. History of present illness most consistent with upper respiratory infection. Recommend supportive care and PCP follow-up as needed  ____________________________________________   FINAL CLINICAL IMPRESSION(S) / ED DIAGNOSES  Final diagnoses:  Upper respiratory infection, viral     Jene Every, MD 09/07/15 [phone]

## 2015-09-07 NOTE — ED Notes (Addendum)
Pt says she's been feeling bad for 2 weeks with cough; now with fever, says 104 at home over the weekend; concerned she may have pneumonia; hoarse voice; general body aches; ambulatory with steady gait; has not taken antipyretic in about 10 hours

## 2015-09-07 NOTE — Discharge Instructions (Signed)
Upper Respiratory Infection, Adult Most upper respiratory infections (URIs) are a viral infection of the air passages leading to the lungs. A URI affects the nose, throat, and upper air passages. The most common type of URI is nasopharyngitis and is typically referred to as "the common cold." URIs run their course and usually go away on their own. Most of the time, a URI does not require medical attention, but sometimes a bacterial infection in the upper airways can follow a viral infection. This is called a secondary infection. Sinus and middle ear infections are common types of secondary upper respiratory infections. Bacterial pneumonia can also complicate a URI. A URI can worsen asthma and chronic obstructive pulmonary disease (COPD). Sometimes, these complications can require emergency medical care and may be life threatening.  CAUSES Almost all URIs are caused by viruses. A virus is a type of germ and can spread from one person to another.  RISKS FACTORS You may be at risk for a URI if:   You smoke.   You have chronic heart or lung disease.  You have a weakened defense (immune) system.   You are very young or very old.   You have nasal allergies or asthma.  You work in crowded or poorly ventilated areas.  You work in health care facilities or schools. SIGNS AND SYMPTOMS  Symptoms typically develop 2-3 days after you come in contact with a cold virus. Most viral URIs last 7-10 days. However, viral URIs from the influenza virus (flu virus) can last 14-18 days and are typically more severe. Symptoms may include:   Runny or stuffy (congested) nose.   Sneezing.   Cough.   Sore throat.   Headache.   Fatigue.   Fever.   Loss of appetite.   Pain in your forehead, behind your eyes, and over your cheekbones (sinus pain).  Muscle aches.  DIAGNOSIS  Your health care provider may diagnose a URI by:  Physical exam.  Tests to check that your symptoms are not due to  another condition such as:  Strep throat.  Sinusitis.  Pneumonia.  Asthma. TREATMENT  A URI goes away on its own with time. It cannot be cured with medicines, but medicines may be prescribed or recommended to relieve symptoms. Medicines may help:  Reduce your fever.  Reduce your cough.  Relieve nasal congestion. HOME CARE INSTRUCTIONS   Take medicines only as directed by your health care provider.   Gargle warm saltwater or take cough drops to comfort your throat as directed by your health care provider.  Use a warm mist humidifier or inhale steam from a shower to increase air moisture. This may make it easier to breathe.  Drink enough fluid to keep your urine clear or pale yellow.   Eat soups and other clear broths and maintain good nutrition.   Rest as needed.   Return to work when your temperature has returned to normal or as your health care provider advises. You may need to stay home longer to avoid infecting others. You can also use a face mask and careful hand washing to prevent spread of the virus.  Increase the usage of your inhaler if you have asthma.   Do not use any tobacco products, including cigarettes, chewing tobacco, or electronic cigarettes. If you need help quitting, ask your health care provider. PREVENTION  The best way to protect yourself from getting a cold is to practice good hygiene.   Avoid oral or hand contact with people with cold   symptoms.   Wash your hands often if contact occurs.  There is no clear evidence that vitamin C, vitamin E, echinacea, or exercise reduces the chance of developing a cold. However, it is always recommended to get plenty of rest, exercise, and practice good nutrition.  SEEK MEDICAL CARE IF:   You are getting worse rather than better.   Your symptoms are not controlled by medicine.   You have chills.  You have worsening shortness of breath.  You have brown or red mucus.  You have yellow or brown nasal  discharge.  You have pain in your face, especially when you bend forward.  You have a fever.  You have swollen neck glands.  You have pain while swallowing.  You have white areas in the back of your throat. SEEK IMMEDIATE MEDICAL CARE IF:   You have severe or persistent:  Headache.  Ear pain.  Sinus pain.  Chest pain.  You have chronic lung disease and any of the following:  Wheezing.  Prolonged cough.  Coughing up blood.  A change in your usual mucus.  You have a stiff neck.  You have changes in your:  Vision.  Hearing.  Thinking.  Mood. MAKE SURE YOU:   Understand these instructions.  Will watch your condition.  Will get help right away if you are not doing well or get worse.   This information is not intended to replace advice given to you by your health care provider. Make sure you discuss any questions you have with your health care provider.   Document Released: 01/12/2001 Document Revised: 12/03/2014 Document Reviewed: 10/24/2013 Elsevier Interactive Patient Education 2016 [employer]  

## 2015-09-07 NOTE — ED Notes (Signed)
Pt. States she has had cold/flu type symptoms(generalized body aches, fever, nausea and vomiting for the past two weeks.  Pt states her rt. Eye has been swollen and red for the past two weeks.

## 2015-09-07 NOTE — ED Notes (Signed)
Pt verbalized understanding of discharge instructions. NAD at this time. 

## 2016-01-01 ENCOUNTER — Encounter: Payer: Self-pay | Admitting: Emergency Medicine

## 2016-01-01 ENCOUNTER — Emergency Department
Admission: EM | Admit: 2016-01-01 | Discharge: 2016-01-01 | Disposition: A | Discharge status: Home / Self Care | Payer: BLUE CROSS/BLUE SHIELD | Attending: Emergency Medicine | Admitting: Emergency Medicine

## 2016-01-01 ENCOUNTER — Emergency Department: Payer: BLUE CROSS/BLUE SHIELD

## 2016-01-01 DIAGNOSIS — Z79899 Other long term (current) drug therapy: Secondary | ICD-10-CM | POA: Insufficient documentation

## 2016-01-01 DIAGNOSIS — Y939 Activity, unspecified: Secondary | ICD-10-CM | POA: Insufficient documentation

## 2016-01-01 DIAGNOSIS — M25572 Pain in left ankle and joints of left foot: Secondary | ICD-10-CM | POA: Diagnosis present

## 2016-01-01 DIAGNOSIS — W11XXXA Fall on and from ladder, initial encounter: Secondary | ICD-10-CM | POA: Diagnosis not present

## 2016-01-01 DIAGNOSIS — Y999 Unspecified external cause status: Secondary | ICD-10-CM | POA: Insufficient documentation

## 2016-01-01 DIAGNOSIS — S93402A Sprain of unspecified ligament of left ankle, initial encounter: Secondary | ICD-10-CM | POA: Diagnosis not present

## 2016-01-01 DIAGNOSIS — Y929 Unspecified place or not applicable: Secondary | ICD-10-CM | POA: Diagnosis not present

## 2016-01-01 DIAGNOSIS — Z7951 Long term (current) use of inhaled steroids: Secondary | ICD-10-CM | POA: Insufficient documentation

## 2016-01-01 MED ORDER — TRAMADOL HCL 50 MG PO TABS: 50.0000 mg | ORAL_TABLET | Freq: Four times a day (QID) | ORAL | Status: DC | PRN

## 2016-01-01 NOTE — Discharge Instructions (Signed)
Ankle Sprain  An ankle sprain is an injury to the strong, fibrous tissues (ligaments) that hold the bones of your ankle joint together.   CAUSES  An ankle sprain is usually caused by a fall or by twisting your ankle. Ankle sprains most commonly occur when you step on the outer edge of your foot, and your ankle turns inward. People who participate in sports are more prone to these types of injuries.   SYMPTOMS    Pain in your ankle. The pain may be present at rest or only when you are trying to stand or walk.   Swelling.   Bruising. Bruising may develop immediately or within 1 to 2 days after your injury.   Difficulty standing or walking, particularly when turning corners or changing directions.  DIAGNOSIS   Your caregiver will ask you details about your injury and perform a physical exam of your ankle to determine if you have an ankle sprain. During the physical exam, your caregiver will press on and apply pressure to specific areas of your foot and ankle. Your caregiver will try to move your ankle in certain ways. An X-ray exam may be done to be sure a bone was not broken or a ligament did not separate from one of the bones in your ankle (avulsion fracture).   TREATMENT   Certain types of braces can help stabilize your ankle. Your caregiver can make a recommendation for this. Your caregiver may recommend the use of medicine for pain. If your sprain is severe, your caregiver may refer you to a surgeon who helps to restore function to parts of your skeletal system (orthopedist) or a physical therapist.  HOME CARE INSTRUCTIONS    Apply ice to your injury for 1-2 days or as directed by your caregiver. Applying ice helps to reduce inflammation and pain.    Put ice in a plastic bag.    Place a towel between your skin and the bag.    Leave the ice on for 15-20 minutes at a time, every 2 hours while you are awake.   Only take over-the-counter or prescription medicines for pain, discomfort, or fever as directed by  your caregiver.   Elevate your injured ankle above the level of your heart as much as possible for 2-3 days.   If your caregiver recommends crutches, use them as instructed. Gradually put weight on the affected ankle. Continue to use crutches or a cane until you can walk without feeling pain in your ankle.   If you have a plaster splint, wear the splint as directed by your caregiver. Do not rest it on anything harder than a pillow for the first 24 hours. Do not put weight on it. Do not get it wet. You may take it off to take a shower or bath.   You may have been given an elastic bandage to wear around your ankle to provide support. If the elastic bandage is too tight (you have numbness or tingling in your foot or your foot becomes cold and blue), adjust the bandage to make it comfortable.   If you have an air splint, you may blow more air into it or let air out to make it more comfortable. You may take your splint off at night and before taking a shower or bath. Wiggle your toes in the splint several times per day to decrease swelling.  SEEK MEDICAL CARE IF:    You have rapidly increasing bruising or swelling.   Your toes feel   extremely cold or you lose feeling in your foot.   Your pain is not relieved with medicine.  SEEK IMMEDIATE MEDICAL CARE IF:   Your toes are numb or blue.   You have severe pain that is increasing.  MAKE SURE YOU:    Understand these instructions.   Will watch your condition.   Will get help right away if you are not doing well or get worse.     This information is not intended to replace advice given to you by your health care provider. Make sure you discuss any questions you have with your health care provider.     Document Released: 07/19/2005 Document Revised: 08/09/2014 Document Reviewed: 07/31/2011  Elsevier Interactive Patient Education 2016 Elsevier Inc.

## 2016-01-01 NOTE — ED Notes (Signed)
Larey SeatFell over a ladder 1-2 days ago.  C/o left ankle pain and swelling.

## 2016-01-01 NOTE — ED Provider Notes (Signed)
[facility] Emergency Department Provider Note ____________________________________________  Time seen: Approximately 10:25 PM  I have reviewed the triage vital signs and the nursing notes.   HISTORY  Chief Complaint Ankle Pain    HPI Sabrina Brennan is a 25 y.o. female who presents to the emergency department for evaluation of left ankle pain and swelling. She states that she fell over a ladder a day or so ago and has since had pain in the left ankle.  History reviewed. No pertinent past medical history.  Patient Active Problem List   Diagnosis Date Noted  . Breast mass in female 08/29/2013    History reviewed. No pertinent past surgical history.  Current Outpatient Rx  Name  Route  Sig  Dispense  Refill  . azithromycin (ZITHROMAX Z-PAK) 250 MG tablet      Take 2 tablets (500 mg) on  Day 1,  followed by 1 tablet (250 mg) once daily on Days 2 through 5.   6 each   0   . butalbital-acetaminophen-caffeine (FIORICET) 50-325-40 MG tablet   Oral   Take 1-2 tablets by mouth every 6 (six) hours as needed for headache.   20 tablet   0   . fexofenadine-pseudoephedrine (ALLEGRA-D) 60-120 MG 12 hr tablet   Oral   Take 1 tablet by mouth 2 (two) times daily.   20 tablet   0   . fexofenadine-pseudoephedrine (ALLEGRA-D) 60-120 MG 12 hr tablet   Oral   Take 1 tablet by mouth 2 (two) times daily.   30 tablet   2   . fluticasone (FLONASE) 50 MCG/ACT nasal spray   Each Nare   Place 2 sprays into both nostrils daily.   16 g   2   . fluticasone (FLONASE) 50 MCG/ACT nasal spray   Each Nare   Place 2 sprays into both nostrils daily.   16 g   2   . promethazine (PHENERGAN) 25 MG tablet   Oral   Take 1 tablet (25 mg total) by mouth every 6 (six) hours as needed for nausea or vomiting.   30 tablet   0   . pseudoephedrine (SUDAFED) 60 MG tablet   Oral   Take 1 tablet (60 mg total) by mouth every 4 (four) hours as needed for congestion.   24  tablet   0   . sulfamethoxazole-trimethoprim (BACTRIM DS,SEPTRA DS) 800-160 MG tablet   Oral   Take 1 tablet by mouth 2 (two) times daily.   20 tablet   0   . traMADol (ULTRAM) 50 MG tablet   Oral   Take 1 tablet (50 mg total) by mouth every 6 (six) hours as needed.   12 tablet   0     Allergies Review of patient's allergies indicates no known allergies.  No family history on file.  Social History Social History  Substance Use Topics  . Smoking status: Never Smoker   . Smokeless tobacco: Never Used  . Alcohol Use: 1.0 oz/week    2 Standard drinks or equivalent per week    Review of Systems Constitutional: No recent illness. Cardiovascular: Denies chest pain or palpitations. Respiratory: Denies shortness of breath. Musculoskeletal: Pain in Left ankle Skin: Negative for rash, wound, lesion. Neurological: Negative for focal weakness or numbness.  ____________________________________________   PHYSICAL EXAM:  VITAL SIGNS: ED Triage Vitals  Enc Vitals Group     BP 01/01/16 2211 117/46 mmHg     Pulse Rate 01/01/16 2211 77  Resp 01/01/16 2211 16     Temp 01/01/16 2211 98.5 F (36.9 C)     Temp Source 01/01/16 2211 Oral     SpO2 --      Weight 01/01/16 2211 118 lb (53.524 kg)     Height 01/01/16 2211  (1.473 m)     Head Cir --      Peak Flow --      Pain Score 01/01/16 2212 10     Pain Loc --      Pain Edu? --      Excl. in GC? --     Constitutional: Alert and oriented. Well appearing and in no acute distress. Eyes: Conjunctivae are normal. EOMI. Head: Atraumatic. Neck: No stridor.  Respiratory: Normal respiratory effort.   Musculoskeletal: ATFL pattern tenderness without significant edema of the left ankle. Full range of motion of the left knee. Neurologic:  Normal speech and language. No gross focal neurologic deficits are appreciated. Speech is normal. No gait instability. Skin:  Skin is warm, dry and intact. Atraumatic. Psychiatric: Mood  and affect are normal. Speech and behavior are normal.  ____________________________________________   LABS (all labs ordered are listed, but only abnormal results are displayed)  Labs Reviewed - No data to display ____________________________________________  RADIOLOGY  Left ankle negative for acute bony abnormality.  I, Kem Boroughs, personally viewed and evaluated these images (plain radiographs) as part of my medical decision making, as well as reviewing the written report by the radiologist.  ____________________________________________   PROCEDURES  Procedure(s) performed:  Velcro ankle stirrup splint applied by ER tech. Patient was neurovascularly intact post-application.   ____________________________________________   INITIAL IMPRESSION / ASSESSMENT AND PLAN / ED COURSE  Pertinent labs & imaging results that were available during my care of the patient were reviewed by me and considered in my medical decision making (see chart for details).  Patient was instructed to rest ice and elevate the left lower extremity and limit weightbearing until pain improves. She was instructed to follow-up with orthopedics for symptoms that are not improving over the week. She was given a prescription for tramadol and advised to take it for pain if needed as prescribed and continue the ibuprofen. She was instructed to return to the emergency department for symptoms that change or worsen if she is unable to schedule an appointment. ____________________________________________   FINAL CLINICAL IMPRESSION(S) / ED DIAGNOSES  Final diagnoses:  Ankle sprain, left, initial encounter       Chinita Pester, FNP 01/01/16 2253  Sharman Cheek, MD 01/02/16 2325

## 2016-01-25 ENCOUNTER — Emergency Department: Payer: BLUE CROSS/BLUE SHIELD

## 2016-01-25 ENCOUNTER — Emergency Department
Admission: EM | Admit: 2016-01-25 | Discharge: 2016-01-25 | Disposition: A | Discharge status: Home / Self Care | Payer: BLUE CROSS/BLUE SHIELD | Attending: Emergency Medicine | Admitting: Emergency Medicine

## 2016-01-25 ENCOUNTER — Encounter: Payer: Self-pay | Admitting: Emergency Medicine

## 2016-01-25 DIAGNOSIS — Z7951 Long term (current) use of inhaled steroids: Secondary | ICD-10-CM | POA: Insufficient documentation

## 2016-01-25 DIAGNOSIS — Z79899 Other long term (current) drug therapy: Secondary | ICD-10-CM | POA: Insufficient documentation

## 2016-01-25 DIAGNOSIS — Z792 Long term (current) use of antibiotics: Secondary | ICD-10-CM | POA: Insufficient documentation

## 2016-01-25 DIAGNOSIS — R1030 Lower abdominal pain, unspecified: Secondary | ICD-10-CM

## 2016-01-25 LAB — COMPREHENSIVE METABOLIC PANEL
ALT: 13 U/L — ABNORMAL LOW (ref 14–54)
AST: 18 U/L (ref 15–41)
Albumin: 4.2 g/dL (ref 3.5–5.0)
Alkaline Phosphatase: 52 U/L (ref 38–126)
Anion gap: 9 (ref 5–15)
BUN: 9 mg/dL (ref 6–20)
CO2: 22 mmol/L (ref 22–32)
Calcium: 9.1 mg/dL (ref 8.9–10.3)
Chloride: 112 mmol/L — ABNORMAL HIGH (ref 101–111)
Creatinine, Ser: 0.8 mg/dL (ref 0.44–1.00)
GFR calc Af Amer: >60 mL/min (ref >60)
GFR calc non Af Amer: >60 mL/min (ref >60)
Glucose, Bld: 92 mg/dL (ref 65–99)
Potassium: 3.8 mmol/L (ref 3.5–5.1)
Sodium: 143 mmol/L (ref 135–145)
Total Bilirubin: 0.6 mg/dL (ref 0.3–1.2)
Total Protein: 7.8 g/dL (ref 6.5–8.1)

## 2016-01-25 LAB — URINALYSIS COMPLETE WITH MICROSCOPIC (ARMC ONLY)
Bilirubin Urine: NEGATIVE
Glucose, UA: NEGATIVE mg/dL
Hgb urine dipstick: NEGATIVE
Ketones, ur: NEGATIVE mg/dL
Leukocytes, UA: NEGATIVE
Nitrite: NEGATIVE
Protein, ur: 100 mg/dL — AB
Specific Gravity, Urine: 1.02 (ref 1.005–1.030)
pH: 6 (ref 5.0–8.0)

## 2016-01-25 LAB — CBC
HCT: 39.3 % (ref 35.0–47.0)
Hemoglobin: 13.5 g/dL (ref 12.0–16.0)
MCH: 31.8 pg (ref 26.0–34.0)
MCHC: 34.3 g/dL (ref 32.0–36.0)
MCV: 92.8 fL (ref 80.0–100.0)
Platelets: 281 10*3/uL (ref 150–440)
RBC: 4.24 MIL/uL (ref 3.80–5.20)
RDW: 12.7 % (ref 11.5–14.5)
WBC: 3.7 10*3/uL (ref 3.6–11.0)

## 2016-01-25 LAB — LIPASE, BLOOD: Lipase: 19 U/L (ref 11–51)

## 2016-01-25 LAB — POCT PREGNANCY, URINE: Preg Test, Ur: NEGATIVE

## 2016-01-25 MED ORDER — ONDANSETRON HCL 4 MG PO TABS: 4.0000 mg | ORAL_TABLET | Freq: Three times a day (TID) | ORAL | Status: DC | PRN

## 2016-01-25 MED ORDER — DICYCLOMINE HCL 20 MG PO TABS: 20.0000 mg | ORAL_TABLET | Freq: Three times a day (TID) | ORAL | Status: DC | PRN

## 2016-01-25 MED ORDER — DIATRIZOATE MEGLUMINE & SODIUM 66-10 % PO SOLN
15.0000 mL | ORAL | Status: AC
Start: 2016-01-25 — End: 2016-01-25
  Administered 2016-01-25 (×2): 15 mL via ORAL

## 2016-01-25 MED ORDER — IOPAMIDOL (ISOVUE-300) INJECTION 61%
100.0000 mL | Freq: Once | INTRAVENOUS | Status: AC | PRN
  Administered 2016-01-25: 75 mL via INTRAVENOUS

## 2016-01-25 NOTE — ED Notes (Signed)
Patient left for CT scan. 

## 2016-01-25 NOTE — ED Provider Notes (Signed)
Time Seen: Approximately *1310 I have reviewed the triage notes  Chief Complaint: Emesis; Dizziness; and Abdominal Pain   History of Present Illness: Sabrina Brennan is a 25 y.o. female *who presents with relatively acute onset of some lower middle quadrant abdominal pain that primarily started this morning. She describes it as a crampy pain that comes in waves. She's had some nausea but no persistent vomiting. She denies any fever at home. She denies any vaginal discharge or bleeding. She denies any dysuria, hematuria or urinary frequency. She is currently not aware of any exacerbating or relieving factors. History reviewed. No pertinent past medical history.  Patient Active Problem List   Diagnosis Date Noted  . Breast mass in female 08/29/2013    History reviewed. No pertinent past surgical history.  History reviewed. No pertinent past surgical history.  Current Outpatient Rx  Name  Route  Sig  Dispense  Refill  . MedroxyPROGESTERone Acetate 150 MG/ML SUSY   Intramuscular   Inject 1 mL into the muscle every 3 (three) months.      0   . azithromycin (ZITHROMAX Z-PAK) 250 MG tablet      Take 2 tablets (500 mg) on  Day 1,  followed by 1 tablet (250 mg) once daily on Days 2 through 5.   6 each   0   . butalbital-acetaminophen-caffeine (FIORICET) 50-325-40 MG tablet   Oral   Take 1-2 tablets by mouth every 6 (six) hours as needed for headache.   20 tablet   0   . fexofenadine-pseudoephedrine (ALLEGRA-D) 60-120 MG 12 hr tablet   Oral   Take 1 tablet by mouth 2 (two) times daily.   30 tablet   2   . fluticasone (FLONASE) 50 MCG/ACT nasal spray   Each Nare   Place 2 sprays into both nostrils daily.   16 g   2   . fluticasone (FLONASE) 50 MCG/ACT nasal spray   Each Nare   Place 2 sprays into both nostrils daily.   16 g   2   . promethazine (PHENERGAN) 25 MG tablet   Oral   Take 1 tablet (25 mg total) by mouth every 6 (six) hours as needed for nausea or  vomiting.   30 tablet   0   . pseudoephedrine (SUDAFED) 60 MG tablet   Oral   Take 1 tablet (60 mg total) by mouth every 4 (four) hours as needed for congestion.   24 tablet   0   . sulfamethoxazole-trimethoprim (BACTRIM DS,SEPTRA DS) 800-160 MG tablet   Oral   Take 1 tablet by mouth 2 (two) times daily.   20 tablet   0   . traMADol (ULTRAM) 50 MG tablet   Oral   Take 1 tablet (50 mg total) by mouth every 6 (six) hours as needed.   12 tablet   0     Allergies:  Review of patient's allergies indicates no known allergies.  Family History: No family history on file.  Social History: Social History  Substance Use Topics  . Smoking status: Never Smoker   . Smokeless tobacco: Never Used  . Alcohol Use: 1.0 oz/week    2 Standard drinks or equivalent per week     Review of Systems:   10 point review of systems was performed and was otherwise negative:  Constitutional: No fever Eyes: No visual disturbances ENT: No sore throat, ear pain Cardiac: No chest pain Respiratory: No shortness of breath, wheezing, or stridor Abdomen:Abdominal pain  is lower middle with nausea no vomiting. Endocrine: No weight loss, No night sweats Extremities: No peripheral edema, cyanosis Skin: No rashes, easy bruising Neurologic: No focal weakness, trouble with speech or swollowing Urologic: No dysuria, Hematuria, or urinary frequency   Physical Exam:  ED Triage Vitals  Enc Vitals Group     BP 01/25/16 1151 112/81 mmHg     Pulse Rate 01/25/16 1151 90     Resp 01/25/16 1151 20     Temp 01/25/16 1151 98.1 F (36.7 C)     Temp Source 01/25/16 1151 Oral     SpO2 01/25/16 1151 100 %     Weight 01/25/16 1151 120 lb (54.432 kg)     Height 01/25/16 1151 4\' 10"  (1.473 m)     Head Cir --      Peak Flow --      Pain Score 01/25/16 1205 8     Pain Loc --      Pain Edu? --      Excl. in GC? --     General: Awake , Alert , and Oriented times 3; GCS 15 Head: Normal cephalic ,  atraumatic Eyes: Pupils equal , round, reactive to light Nose/Throat: No nasal drainage, patent upper airway without erythema or exudate.  Neck: Supple, Full range of motion, No anterior adenopathy or palpable thyroid masses Lungs: Clear to ascultation without wheezes , rhonchi, or rales Heart: Regular rate, regular rhythm without murmurs , gallops , or rubs Abdomen: Diffuse tenderness bilaterally across the lower abdominal region without rebound, guarding, or rigidity. Bowel sounds are positive and symmetric in all 4 quadrants. There is no palpable masses. No obvious focal tenderness over McBurney's point. Negative Murphy's sign       Extremities: 2 plus symmetric pulses. No edema, clubbing or cyanosis Neurologic: normal ambulation, Motor symmetric without deficits, sensory intact Skin: warm, dry, no rashes   Labs:   All laboratory work was reviewed including any pertinent negatives or positives listed below:  Labs Reviewed  COMPREHENSIVE METABOLIC PANEL - Abnormal; Notable for the following:    Chloride 112 (*)    ALT 13 (*)    All other components within normal limits  URINALYSIS COMPLETEWITH MICROSCOPIC ([facility] ONLY) - Abnormal; Notable for the following:    Color, Urine YELLOW (*)    APPearance HAZY (*)    Protein, ur 100 (*)    Bacteria, UA RARE (*)    Squamous Epithelial / LPF 6-30 (*)    All other components within normal limits  LIPASE, BLOOD  CBC  POC URINE PREG, ED  POCT PREGNANCY, URINE  Laboratory work was reviewed and showed no clinically significant abnormalities.   Radiology:    CT ABDOMEN PELVIS W CONTRAST (Final result) Result time: 01/25/16 14:57:29   Final result by Rad Results In Interface (01/25/16 14:57:29)   Narrative:   CLINICAL DATA: BILATERAL lower abdominal pain, vomiting, and dizziness beginning this morning  EXAM: CT ABDOMEN AND PELVIS WITH CONTRAST  TECHNIQUE: Multidetector CT imaging of the abdomen and pelvis was performed using the  standard protocol following bolus administration of intravenous contrast. Sagittal and coronal MPR images reconstructed from axial data set.  CONTRAST: 75mL ISOVUE-300 IOPAMIDOL (ISOVUE-300) INJECTION 61% IV. Dilute oral contrast.  COMPARISON: 04/25/2010  FINDINGS: Lower chest: Lung bases clear.  Hepatobiliary: Cranial aspect of liver excluded. Gallbladder and visualized portions of liver normal appearance  Pancreas: Normal appearance  Spleen: Normal appearance  Adrenals/Urinary Tract: Kidneys and adrenal glands normal appearance. Bladder and ureters  normal appearance.  Stomach/Bowel: Appendix normal appearance. Distal descending and sigmoid colon decompressed, with suboptimal assessment of wall thickness. Stool in rectum. Stomach and bowel loops otherwise normal appearance.  Vascular/Lymphatic: Vascular structures patent. No adenopathy.  Reproductive: Uterus and ovaries grossly normal appearance. Small amount of nonspecific free pelvic fluid.  Other: No mass, free air, or hernia.  Musculoskeletal: Normal appearance  IMPRESSION: Small amount of nonspecific free pelvic fluid.  Otherwise normal exam.   Electronically Signed By: Ulyses SouthwardMark Boles M.D. On: 01/25/2016 14:57        I personally reviewed the radiologic studies     ED Course:  Patient's stay here was uneventful and the patient does not appear to be in any apparent distress and no obvious surgical findings on her CAT scan. Differential diagnosis includes but is not exclusive to ovarian cyst, ovarian torsion, acute appendicitis, urinary tract infection, endometriosis, bowel obstruction, colitis, renal colic, gastroenteritis, etc. Given her presentation and objective findings not sure the exact cause or abdominal pain but again I felt it did not require surgical consultation or inpatient management. Patient was given a prescription for Bentyl and was advised drink plenty of fluids and she can also  take ibuprofen and/or Tylenol over-the-counter for pain. Return to emergency department especially if she develops a fever, bloody diarrhea, or any other new concerns.    Assessment: * Acute unspecified abdominal pain      Plan:  Outpatient Prescription for Bentyl and Zofran Patient was advised to return immediately if condition worsens. Patient was advised to follow up with their primary care physician or other specialized physicians involved in their outpatient care. The patient and/or family member/power of attorney had laboratory results reviewed at the bedside. All questions and concerns were addressed and appropriate discharge instructions were distributed by the nursing staff.            Jennye MoccasinBrian S Quigley, MD 01/25/16 [phone]

## 2016-01-25 NOTE — Discharge Instructions (Signed)
Abdominal Pain, Adult Many things can cause abdominal pain. Usually, abdominal pain is not caused by a disease and will improve without treatment. It can often be observed and treated at home. Your health care provider will do a physical exam and possibly order blood tests and X-rays to help determine the seriousness of your pain. However, in many cases, more time must pass before a clear cause of the pain can be found. Before that point, your health care provider may not know if you need more testing or further treatment. HOME CARE INSTRUCTIONS Monitor your abdominal pain for any changes. The following actions may help to alleviate any discomfort you are experiencing:  Only take over-the-counter or prescription medicines as directed by your health care provider.  Do not take laxatives unless directed to do so by your health care provider.  Try a clear liquid diet (broth, tea, or water) as directed by your health care provider. Slowly move to a bland diet as tolerated. SEEK MEDICAL CARE IF:  You have unexplained abdominal pain.  You have abdominal pain associated with nausea or diarrhea.  You have pain when you urinate or have a bowel movement.  You experience abdominal pain that wakes you in the night.  You have abdominal pain that is worsened or improved by eating food.  You have abdominal pain that is worsened with eating fatty foods.  You have a fever. SEEK IMMEDIATE MEDICAL CARE IF:  Your pain does not go away within 2 hours.  You keep throwing up (vomiting).  Your pain is felt only in portions of the abdomen, such as the right side or the left lower portion of the abdomen.  You pass bloody or black tarry stools. MAKE SURE YOU:  Understand these instructions.  Will watch your condition.  Will get help right away if you are not doing well or get worse.   This information is not intended to replace advice given to you by your health care provider. Make sure you discuss  any questions you have with your health care provider.   Document Released: 04/28/2005 Document Revised: 04/09/2015 Document Reviewed: 03/28/2013 Elsevier Interactive Patient Education [employer]2016 [employer] Inc.  Please return immediately if condition worsens. Please contact her primary physician or the physician you were given for referral. If you have any specialist physicians involved in her treatment and plan please also contact them. Thank you for using [facility] [facility]. Return emergency department especially for fever, bloody stool, or any other new concerns.

## 2016-01-25 NOTE — ED Notes (Signed)
Pt presents to ED with reports of vomiting, dizziness and lower abdominal pain that started this morning. Pt denies diarrhea.

## 2016-05-23 ENCOUNTER — Encounter: Payer: Self-pay | Admitting: Emergency Medicine

## 2016-05-23 ENCOUNTER — Emergency Department
Admission: EM | Admit: 2016-05-23 | Discharge: 2016-05-23 | Disposition: A | Discharge status: Home / Self Care | Payer: BLUE CROSS/BLUE SHIELD | Attending: Student in an Organized Health Care Education/Training Program | Admitting: Student in an Organized Health Care Education/Training Program

## 2016-05-23 DIAGNOSIS — Z792 Long term (current) use of antibiotics: Secondary | ICD-10-CM | POA: Insufficient documentation

## 2016-05-23 DIAGNOSIS — M722 Plantar fascial fibromatosis: Secondary | ICD-10-CM | POA: Diagnosis not present

## 2016-05-23 DIAGNOSIS — Z79899 Other long term (current) drug therapy: Secondary | ICD-10-CM | POA: Insufficient documentation

## 2016-05-23 DIAGNOSIS — Z7951 Long term (current) use of inhaled steroids: Secondary | ICD-10-CM | POA: Insufficient documentation

## 2016-05-23 DIAGNOSIS — M79671 Pain in right foot: Secondary | ICD-10-CM | POA: Diagnosis present

## 2016-05-23 MED ORDER — PREDNISONE 10 MG (21) PO TBPK: ORAL_TABLET | ORAL | 0 refills | Status: DC

## 2016-05-23 NOTE — Discharge Instructions (Signed)
Follow-up with podiatry for symptoms that are not improving over the week. Do not take any other medications such as Goody powder, BC powder, ibuprofen, Advil, or Aleve while taking the prednisone. You may take Tylenol if needed.  Return to the emergency department for symptoms that change or worsen if you're unable to schedule an appointment with her primary care provider or with the podiatrist.

## 2016-05-23 NOTE — ED Notes (Signed)
Rt foot pain. In the arch of her foot that goes into the heel. X 1 month. Took ibuprofen this am.

## 2016-05-23 NOTE — ED Provider Notes (Signed)
[facility] Emergency Department Provider Note ____________________________________________  Time seen: Approximately 9:57 PM  I have reviewed the triage vital signs and the nursing notes.   HISTORY  Chief Complaint Foot Pain    HPI Sabrina Brennan is a 25 y.o. female who presents to the emergency department for evaluation of foot pain that has been present for the pastmonth. Over the past 4 days, pain has gotten worse and is now in her heel and leg. She denies injury. She states that the pain initially started around her toes and has since migrated into the arch and heel of her foot. She states the pain radiates up into her calf. She has taken ibuprofen without relief. She has also tried Epson salt soaks without relief.  History reviewed. No pertinent past medical history.  Patient Active Problem List   Diagnosis Date Noted  . Breast mass in female 08/29/2013    History reviewed. No pertinent surgical history.  Prior to Admission medications   Medication Sig Start Date End Date Taking? Authorizing Provider  azithromycin (ZITHROMAX Z-PAK) 250 MG tablet Take 2 tablets (500 mg) on  Day 1,  followed by 1 tablet (250 mg) once daily on Days 2 through 5. 05/08/15   Evangeline Dakin, PA-C  butalbital-acetaminophen-caffeine (FIORICET) [phone] MG tablet Take 1-2 tablets by mouth every 6 (six) hours as needed for headache. 06/24/15   Joni Reining, PA-C  dicyclomine (BENTYL) 20 MG tablet Take 1 tablet (20 mg total) by mouth 3 (three) times daily as needed for spasms. 01/25/16   Jennye Moccasin, MD  fexofenadine-pseudoephedrine (ALLEGRA-D) 60-120 MG 12 hr tablet Take 1 tablet by mouth 2 (two) times daily. 06/24/15   Joni Reining, PA-C  fluticasone (FLONASE) 50 MCG/ACT nasal spray Place 2 sprays into both nostrils daily. 05/27/15 05/26/16  Joni Reining, PA-C  fluticasone (FLONASE) 50 MCG/ACT nasal spray Place 2 sprays into both nostrils daily. 06/24/15 06/23/16   Joni Reining, PA-C  MedroxyPROGESTERone Acetate 150 MG/ML SUSY Inject 1 mL into the muscle every 3 (three) months. 01/01/16   Historical Provider, MD  ondansetron (ZOFRAN) 4 MG tablet Take 1 tablet (4 mg total) by mouth every 8 (eight) hours as needed for nausea or vomiting. 01/25/16   Jennye Moccasin, MD  predniSONE (STERAPRED UNI-PAK 21 TAB) 10 MG (21) TBPK tablet Take 6 tablets on day 1 Take 5 tablets on day 2 Take 4 tablets on day 3 Take 3 tablets on day 4 Take 2 tablets on day 5 Take 1 tablet on day 6 05/23/16   Raja Caputi B Eulene Pekar, FNP  promethazine (PHENERGAN) 25 MG tablet Take 1 tablet (25 mg total) by mouth every 6 (six) hours as needed for nausea or vomiting. 05/08/15   Charmayne Sheer Beers, PA-C  pseudoephedrine (SUDAFED) 60 MG tablet Take 1 tablet (60 mg total) by mouth every 4 (four) hours as needed for congestion. 05/08/15   Charmayne Sheer Beers, PA-C  sulfamethoxazole-trimethoprim (BACTRIM DS,SEPTRA DS) 800-160 MG tablet Take 1 tablet by mouth 2 (two) times daily. 06/24/15   Joni Reining, PA-C  traMADol (ULTRAM) 50 MG tablet Take 1 tablet (50 mg total) by mouth every 6 (six) hours as needed. 01/01/16   Chinita Pester, FNP    Allergies Review of patient's allergies indicates no known allergies.  History reviewed. No pertinent family history.  Social History Social History  Substance Use Topics  . Smoking status: Never Smoker  . Smokeless tobacco: Never Used  .  Alcohol use 1.0 oz/week    2 Standard drinks or equivalent per week    Review of Systems Constitutional: No recent illness. Cardiovascular: Denies chest pain or palpitations. Respiratory: Denies shortness of breath. Musculoskeletal: Pain in Right foot Skin: Negative for rash, wound, lesion. Neurological: Negative for focal weakness or numbness.  ____________________________________________   PHYSICAL EXAM:  VITAL SIGNS: ED Triage Vitals  Enc Vitals Group     BP 05/23/16 2104 117/72     Pulse Rate 05/23/16 2104 74      Resp 05/23/16 2104 18     Temp 05/23/16 2104 98.2 F (36.8 C)     Temp Source 05/23/16 2104 Oral     SpO2 05/23/16 2104 99 %     Weight 05/23/16 2101 128 lb (58.1 kg)     Height 05/23/16 2101 4\' 10"  (1.473 m)     Head Circumference --      Peak Flow --      Pain Score 05/23/16 2103 10     Pain Loc --      Pain Edu? --      Excl. in GC? --     Constitutional: Alert and oriented. Well appearing and in no acute distress. Eyes: Conjunctivae are normal. EOMI. Head: Atraumatic. Neck: No stridor.  Respiratory: Normal respiratory effort.   Musculoskeletal: Full, active range of motion of the right foot and toes. Tender to palpation over the plantar aspect of the arch and heel. Neurologic:  Normal speech and language. No gross focal neurologic deficits are appreciated. Speech is normal. No gait instability. Skin:  Skin is warm, dry and intact. Atraumatic. Psychiatric: Mood and affect are normal. Speech and behavior are normal.  ____________________________________________   LABS (all labs ordered are listed, but only abnormal results are displayed)  Labs Reviewed - No data to display ____________________________________________  RADIOLOGY  Not indicated ____________________________________________   PROCEDURES  Procedure(s) performed: None   ____________________________________________   INITIAL IMPRESSION / ASSESSMENT AND PLAN / ED COURSE  Clinical Course    Pertinent labs & imaging results that were available during my care of the patient were reviewed by me and considered in my medical decision making (see chart for details).  Patient was given a prescription for prednisone. She is to avoid taking all other NSAIDs and was advised soap. She was instructed to follow up with podiatry for symptoms that are not improving over the week. She was instructed to return to the emergency department for symptoms that change or worsen if she is unable schedule an  appointment. ____________________________________________   FINAL CLINICAL IMPRESSION(S) / ED DIAGNOSES  Final diagnoses:  Plantar fasciitis of right foot       Chinita PesterCari B Azriel Jakob, FNP 05/23/16 2219    Willy EddyPatrick Robinson, MD 05/24/16 [id number]

## 2016-05-23 NOTE — ED Triage Notes (Signed)
Pt states approx 1 month ago she began having pain in her toes described as a tingling pain. Pt states over the last 3-4 days pain has gotten worse and extended back to her heel and up her leg. Pt ambulatory from the lobby at this time. No obvious deformity noted to her R foot. Pt states +movement and +sensation. Pt denies any injury at this time.

## 2016-07-09 IMAGING — CR DG ANKLE COMPLETE 3+V*L*
1 series · 4 of 4 positions shown · non-contrast
Comparison: None.

CLINICAL DATA: Pain after trauma

EXAM:
LEFT ANKLE COMPLETE - 3+ VIEW

[Series 1: x ankle ap left · 0.14mm/px · 4 of 4 slices shown]
[im 1/4]
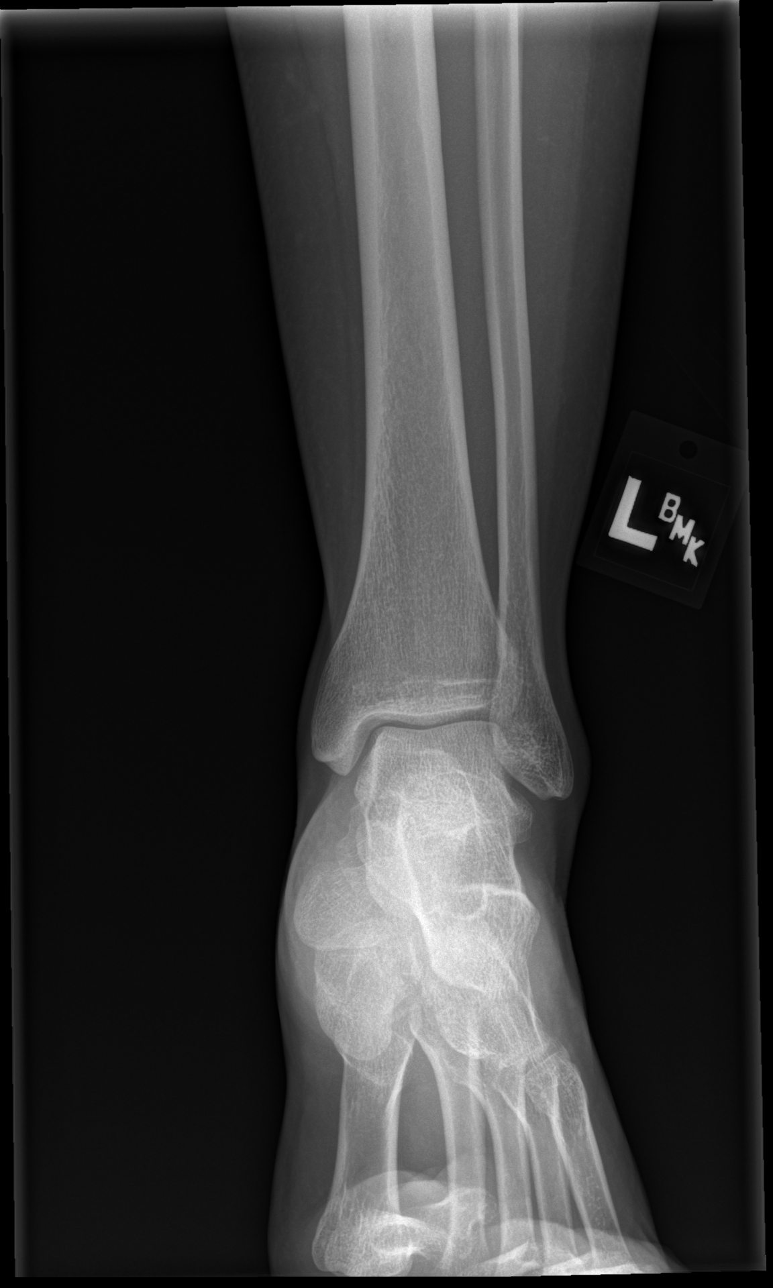
[im 2/4]
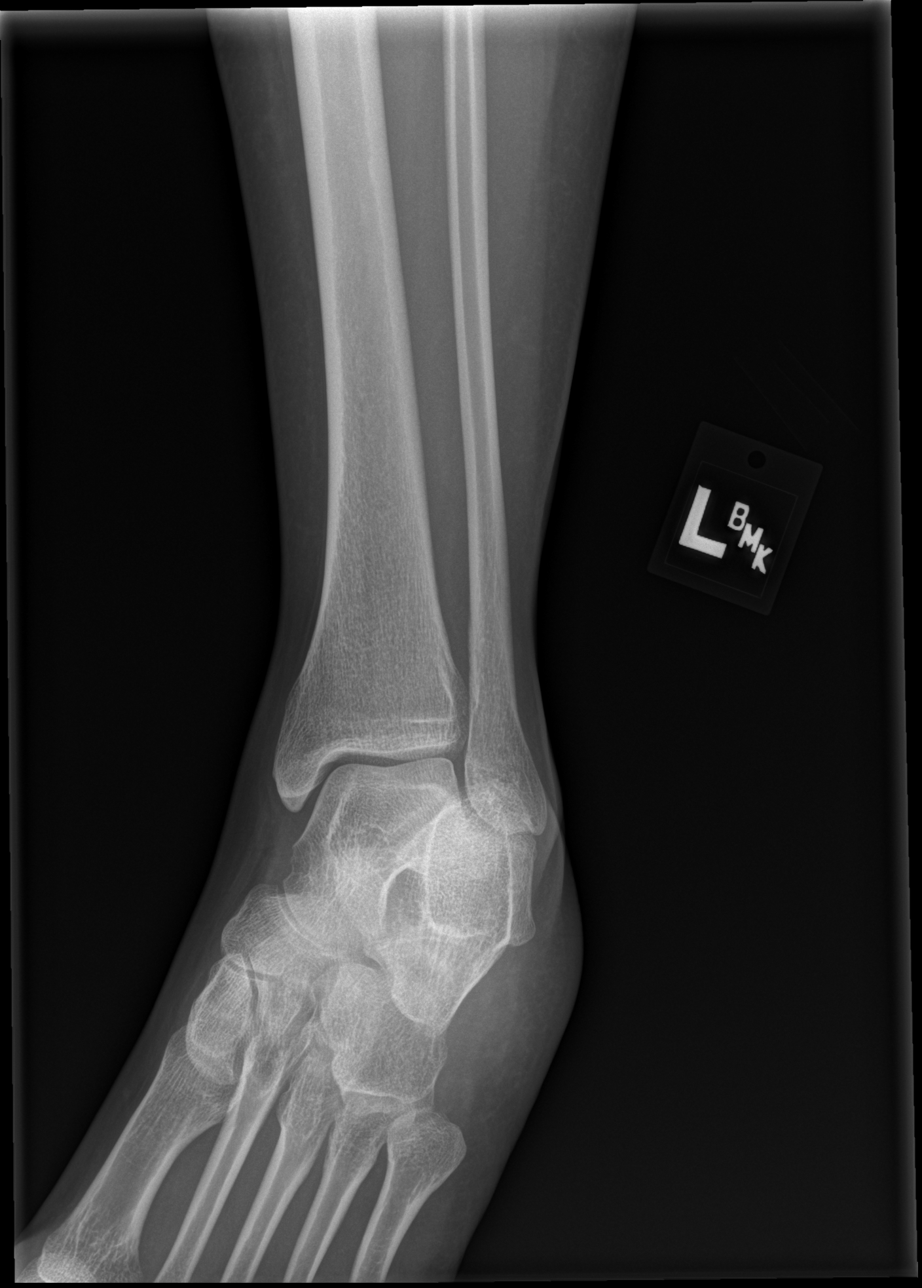
[im 3/4]
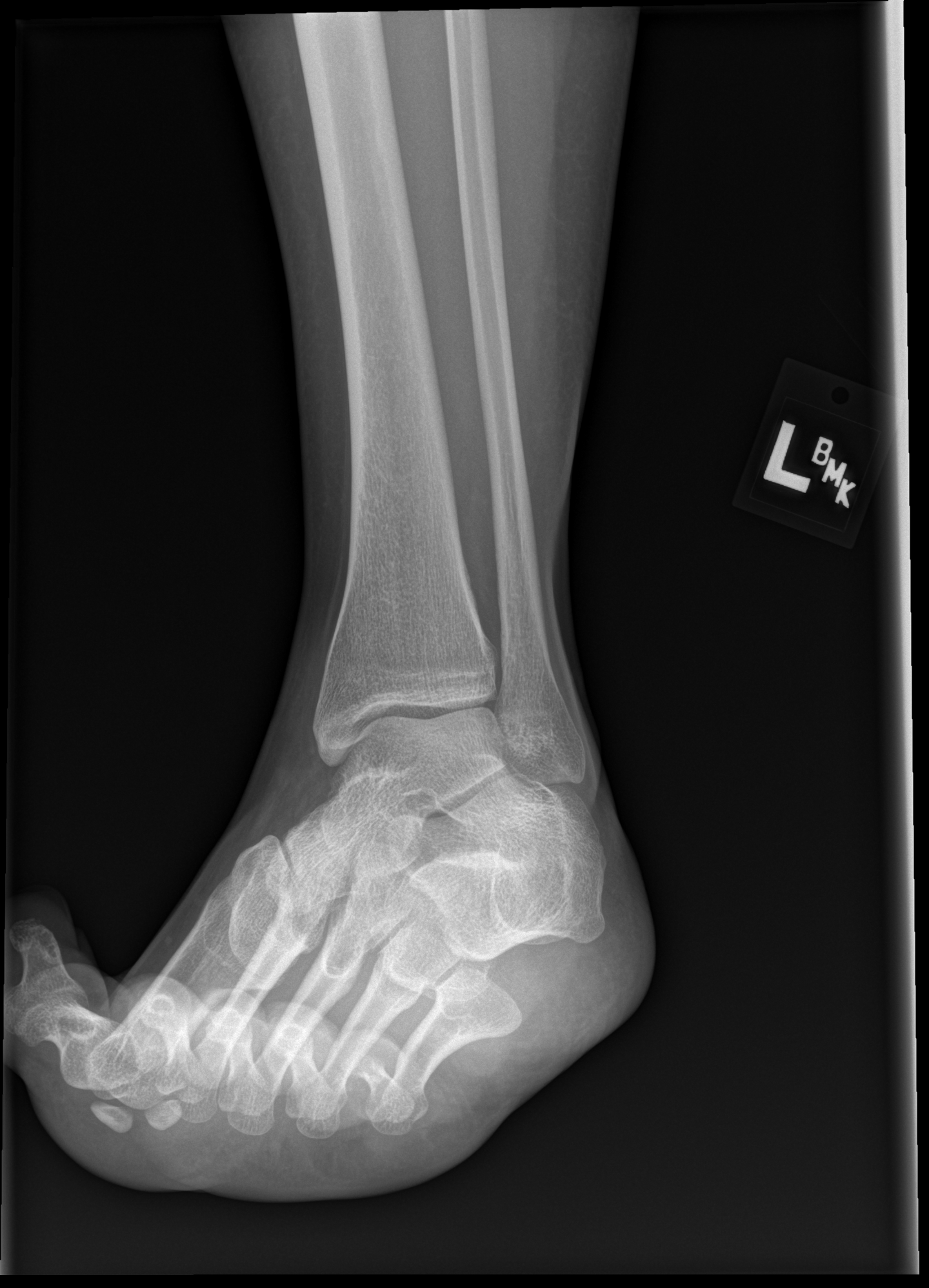
[im 4/4]
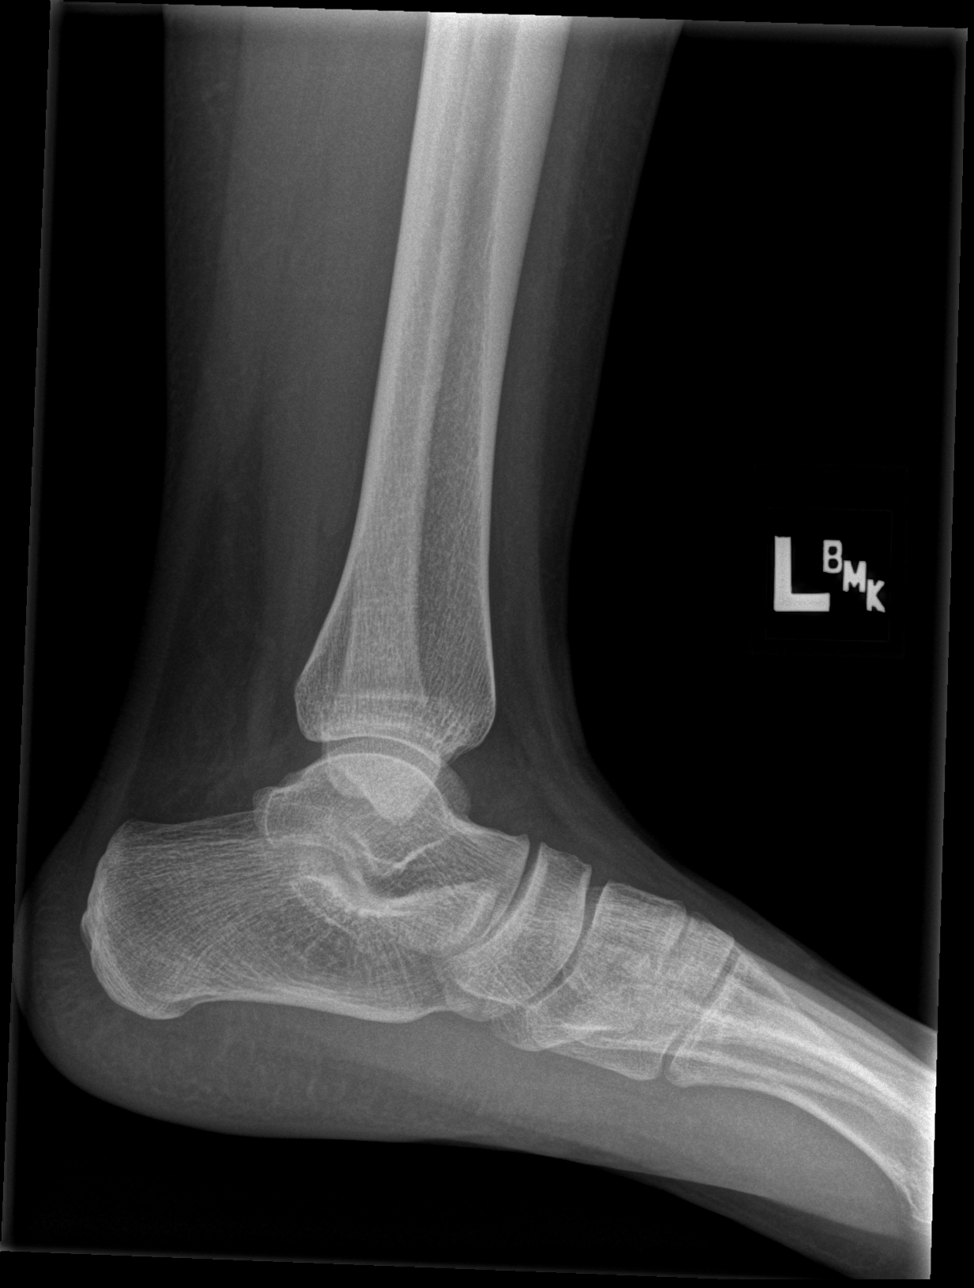

[4 of 4 positions shown; findings below may reference images not displayed]

FINDINGS: There is no evidence of fracture, dislocation, or joint effusion.
There is no evidence of arthropathy or other focal bone abnormality.
Soft tissues are unremarkable.
IMPRESSION: Negative.

## 2016-10-12 LAB — PULMONARY FUNCTION TEST

## 2016-12-30 ENCOUNTER — Emergency Department (HOSPITAL_COMMUNITY)
Admission: EM | Admit: 2016-12-30 | Discharge: 2016-12-31 | Disposition: A | Discharge status: Home / Self Care | Payer: BLUE CROSS/BLUE SHIELD | Attending: Emergency Medicine | Admitting: Emergency Medicine

## 2016-12-30 ENCOUNTER — Encounter (HOSPITAL_COMMUNITY): Payer: Self-pay

## 2016-12-30 DIAGNOSIS — W540XXA Bitten by dog, initial encounter: Secondary | ICD-10-CM | POA: Insufficient documentation

## 2016-12-30 DIAGNOSIS — Z792 Long term (current) use of antibiotics: Secondary | ICD-10-CM | POA: Diagnosis not present

## 2016-12-30 DIAGNOSIS — Z7952 Long term (current) use of systemic steroids: Secondary | ICD-10-CM | POA: Insufficient documentation

## 2016-12-30 DIAGNOSIS — Z23 Encounter for immunization: Secondary | ICD-10-CM | POA: Diagnosis present

## 2016-12-30 DIAGNOSIS — Z79899 Other long term (current) drug therapy: Secondary | ICD-10-CM | POA: Diagnosis not present

## 2016-12-30 DIAGNOSIS — Y939 Activity, unspecified: Secondary | ICD-10-CM | POA: Insufficient documentation

## 2016-12-30 DIAGNOSIS — Y929 Unspecified place or not applicable: Secondary | ICD-10-CM | POA: Insufficient documentation

## 2016-12-30 DIAGNOSIS — Y999 Unspecified external cause status: Secondary | ICD-10-CM | POA: Insufficient documentation

## 2016-12-30 MED ORDER — RABIES VACCINE, PCEC IM SUSR
1.0000 mL | Freq: Once | INTRAMUSCULAR | Status: AC
  Administered 2016-12-30: 1 mL via INTRAMUSCULAR
  Filled 2016-12-30: qty 1

## 2016-12-30 MED ORDER — RABIES IMMUNE GLOBULIN 150 UNIT/ML IM INJ
20.0000 [IU]/kg | INJECTION | Freq: Once | INTRAMUSCULAR | Status: AC
  Administered 2016-12-30: 1200 [IU] via INTRAMUSCULAR
  Filled 2016-12-30: qty 8

## 2016-12-30 NOTE — ED Provider Notes (Signed)
[facility]-EMERGENCY DEPT Provider Note    By signing my name below, I, Sabrina Brennan, attest that this documentation has been prepared under the direction and in the presence of Sabrina Brennan, Sabrina Thueson, MD. Electronically Signed: Earmon PhoenixJennifer Brennan, ED Scribe. 12/30/16. 11:00 PM.    History   Chief Complaint Chief Complaint  Patient presents with  . Animal Bite  . Rabies Injection   The history is provided by the patient and medical records. No language interpreter was used.    Sabrina Brennan is a 26 y.o. female who presents to the Emergency Department complaining of dog bites to bilateral lower extremities and right breast that occurred four days ago. She reports associated pain surrounding the bites. She has contacted animal control who plan to pick up the animal soon for observation but she is requesting the rabies injections because she is very anxious. She has not taken anything for pain. There are no modifying factors noted. She denies fever, chills, nausea, vomiting.   History reviewed. No pertinent past medical history.  Patient Active Problem List   Diagnosis Date Noted  . Breast mass in female 08/29/2013    History reviewed. No pertinent surgical history.  OB History    Gravida Para Term Preterm AB Living   0             SAB TAB Ectopic Multiple Live Births                  Obstetric Comments   1st Menstrual Cycle:  14       Home Medications    Prior to Admission medications   Medication Sig Start Date End Date Taking? Authorizing Provider  azithromycin (ZITHROMAX Z-PAK) 250 MG tablet Take 2 tablets (500 mg) on  Day 1,  followed by 1 tablet (250 mg) once daily on Days 2 through 5. 05/08/15   Beers, Charmayne Sheerharles M, PA-C  butalbital-acetaminophen-caffeine (FIORICET) [phone] MG tablet Take 1-2 tablets by mouth every 6 (six) hours as needed for headache. 06/24/15   Joni ReiningSmith, Ronald K, PA-C  dicyclomine (BENTYL) 20 MG tablet Take 1 tablet (20 mg total) by mouth 3 (three) times  daily as needed for spasms. 01/25/16   Jennye MoccasinQuigley, Brian S, MD  fexofenadine-pseudoephedrine (ALLEGRA-D) 60-120 MG 12 hr tablet Take 1 tablet by mouth 2 (two) times daily. 06/24/15   Joni ReiningSmith, Ronald K, PA-C  fluticasone (FLONASE) 50 MCG/ACT nasal spray Place 2 sprays into both nostrils daily. 05/27/15 05/26/16  Joni ReiningSmith, Ronald K, PA-C  fluticasone (FLONASE) 50 MCG/ACT nasal spray Place 2 sprays into both nostrils daily. 06/24/15 06/23/16  Joni ReiningSmith, Ronald K, PA-C  MedroxyPROGESTERone Acetate 150 MG/ML SUSY Inject 1 mL into the muscle every 3 (three) months. 01/01/16   [provider]  ondansetron (ZOFRAN) 4 MG tablet Take 1 tablet (4 mg total) by mouth every 8 (eight) hours as needed for nausea or vomiting. 01/25/16   Jennye MoccasinQuigley, Brian S, MD  predniSONE (STERAPRED UNI-PAK 21 TAB) 10 MG (21) TBPK tablet Take 6 tablets on day 1 Take 5 tablets on day 2 Take 4 tablets on day 3 Take 3 tablets on day 4 Take 2 tablets on day 5 Take 1 tablet on day 6 05/23/16   Triplett, Cari B, FNP  promethazine (PHENERGAN) 25 MG tablet Take 1 tablet (25 mg total) by mouth every 6 (six) hours as needed for nausea or vomiting. 05/08/15   Beers, Charmayne Sheerharles M, PA-C  pseudoephedrine (SUDAFED) 60 MG tablet Take 1 tablet (60 mg total) by mouth every 4 (  four) hours as needed for congestion. 05/08/15   Beers, Charmayne Sheer, PA-C  sulfamethoxazole-trimethoprim (BACTRIM DS,SEPTRA DS) 800-160 MG tablet Take 1 tablet by mouth 2 (two) times daily. 06/24/15   Joni Reining, PA-C  traMADol (ULTRAM) 50 MG tablet Take 1 tablet (50 mg total) by mouth every 6 (six) hours as needed. 01/01/16   Triplett, Kasandra Knudsen, FNP    Family History No family history on file.  Social History Social History  Substance Use Topics  . Smoking status: Never Smoker  . Smokeless tobacco: Never Used  . Alcohol use 1.0 oz/week    2 Standard drinks or equivalent per week     Allergies   Patient has no known allergies.   Review of Systems Review of Systems All other  systems reviewed and are negative for acute change except as noted in the HPI.   Physical Exam Updated Vital Signs BP 126/76 (BP Location: Left Arm)   Pulse 84   Temp 98.7 F (37.1 C) (Oral)   LMP  (Approximate) Comment: 3 months ago due to depo shot   SpO2 99%   Physical Exam  Constitutional: She is oriented to person, place, and time. She appears well-developed and well-nourished. No distress.  HENT:  Head: Normocephalic and atraumatic.  Eyes: EOM are normal.  Neck: Normal range of motion.  Cardiovascular: Normal rate, regular rhythm and normal heart sounds.   Pulmonary/Chest: Effort normal and breath sounds normal.  Abdominal: Soft. She exhibits no distension. There is no tenderness.  Musculoskeletal: Normal range of motion.  Scabbed wound to left anterior thigh and right calf. Small amount of bruising and small hematoma to right calf. No redness. No drainage.  Neurological: She is alert and oriented to person, place, and time.  Skin: Skin is warm and dry.  Psychiatric: She has a normal mood and affect. Judgment normal.  Nursing note and vitals reviewed.    ED Treatments / Results  DIAGNOSTIC STUDIES: Oxygen Saturation is 99% on RA, normal by my interpretation.   COORDINATION OF CARE: 10:59 PM- Will order rabies injections. Pt verbalizes understanding and agrees to plan.  Medications  rabies immune globulin (HYPERAB) injection 20 Units/kg (not administered)  rabies vaccine (RABAVERT) injection 1 mL (not administered)    Labs (all labs ordered are listed, but only abnormal results are displayed) Labs Reviewed - No data to display  EKG  EKG Interpretation None       Radiology No results found.  Procedures Procedures (including critical care time)  Medications Ordered in ED Medications  rabies immune globulin (HYPERAB) injection 20 Units/kg (not administered)  rabies vaccine (RABAVERT) injection 1 mL (not administered)     Initial Impression /  Assessment and Plan / ED Course  I have reviewed the triage vital signs and the nursing notes.  Pertinent labs & imaging results that were available during my care of the patient were reviewed by me and considered in my medical decision making (see chart for details).     26yF s/p dog bite. Wound care. PEP for rabies.   Final Clinical Impressions(s) / ED Diagnoses   Final diagnoses:  Need for rabies vaccination  Dog bite, initial encounter    New Prescriptions New Prescriptions   No medications on file    I personally preformed the services scribed in my presence. The recorded information has been reviewed is accurate. Sabrina Razor, MD.    Sabrina Razor, MD 01/02/17 1230

## 2016-12-30 NOTE — Discharge Instructions (Signed)
You need three more rabies immunizations. You need the next 3 days from today (June 3) and the next on June 7th and the last on June 14th. [facility] to get these.

## 2016-12-30 NOTE — ED Triage Notes (Signed)
Pt states she was seen and treated for pit bull bite on bilateral thighs; pt was told to come in for series of rabies shots; pt c/o pain at 6/10 but has medication by Md; pt a&ox 4 on arrival.

## 2016-12-30 NOTE — ED Notes (Signed)
EDP at bedside  

## 2016-12-31 ENCOUNTER — Encounter (HOSPITAL_COMMUNITY): Payer: Self-pay | Admitting: Emergency Medicine

## 2016-12-31 ENCOUNTER — Emergency Department (HOSPITAL_COMMUNITY)
Admission: EM | Admit: 2016-12-31 | Discharge: 2016-12-31 | Disposition: A | Discharge status: Home / Self Care | Payer: BLUE CROSS/BLUE SHIELD | Source: Home / Self Care | Attending: Emergency Medicine | Admitting: Emergency Medicine

## 2016-12-31 DIAGNOSIS — W540XXD Bitten by dog, subsequent encounter: Secondary | ICD-10-CM

## 2016-12-31 DIAGNOSIS — R52 Pain, unspecified: Secondary | ICD-10-CM | POA: Insufficient documentation

## 2016-12-31 DIAGNOSIS — Y929 Unspecified place or not applicable: Secondary | ICD-10-CM | POA: Insufficient documentation

## 2016-12-31 DIAGNOSIS — Z23 Encounter for immunization: Secondary | ICD-10-CM

## 2016-12-31 DIAGNOSIS — S8992XA Unspecified injury of left lower leg, initial encounter: Secondary | ICD-10-CM

## 2016-12-31 DIAGNOSIS — Y939 Activity, unspecified: Secondary | ICD-10-CM | POA: Insufficient documentation

## 2016-12-31 DIAGNOSIS — W540XXA Bitten by dog, initial encounter: Secondary | ICD-10-CM

## 2016-12-31 DIAGNOSIS — Z79899 Other long term (current) drug therapy: Secondary | ICD-10-CM

## 2016-12-31 DIAGNOSIS — Y999 Unspecified external cause status: Secondary | ICD-10-CM

## 2016-12-31 DIAGNOSIS — R110 Nausea: Secondary | ICD-10-CM | POA: Insufficient documentation

## 2016-12-31 DIAGNOSIS — S20101A Unspecified superficial injuries of breast, right breast, initial encounter: Secondary | ICD-10-CM

## 2016-12-31 LAB — COMPREHENSIVE METABOLIC PANEL
ALT: 12 U/L — ABNORMAL LOW (ref 14–54)
AST: 19 U/L (ref 15–41)
Albumin: 3.7 g/dL (ref 3.5–5.0)
Alkaline Phosphatase: 50 U/L (ref 38–126)
Anion gap: 8 (ref 5–15)
BUN: 8 mg/dL (ref 6–20)
CO2: 23 mmol/L (ref 22–32)
Calcium: 9.2 mg/dL (ref 8.9–10.3)
Chloride: 107 mmol/L (ref 101–111)
Creatinine, Ser: 0.87 mg/dL (ref 0.44–1.00)
GFR calc Af Amer: >60 mL/min (ref >60)
GFR calc non Af Amer: >60 mL/min (ref >60)
Glucose, Bld: 105 mg/dL — ABNORMAL HIGH (ref 65–99)
Potassium: 3.8 mmol/L (ref 3.5–5.1)
Sodium: 138 mmol/L (ref 135–145)
Total Bilirubin: 0.9 mg/dL (ref 0.3–1.2)
Total Protein: 7.2 g/dL (ref 6.5–8.1)

## 2016-12-31 LAB — CBC
HCT: 38.8 % (ref 36.0–46.0)
Hemoglobin: 13.1 g/dL (ref 12.0–15.0)
MCH: 32.1 pg (ref 26.0–34.0)
MCHC: 33.8 g/dL (ref 30.0–36.0)
MCV: 95.1 fL (ref 78.0–100.0)
Platelets: 335 10*3/uL (ref 150–400)
RBC: 4.08 MIL/uL (ref 3.87–5.11)
RDW: 12.5 % (ref 11.5–15.5)
WBC: 7.4 10*3/uL (ref 4.0–10.5)

## 2016-12-31 LAB — URINALYSIS, ROUTINE W REFLEX MICROSCOPIC
Bilirubin Urine: NEGATIVE
Glucose, UA: NEGATIVE mg/dL
Ketones, ur: NEGATIVE mg/dL
Nitrite: NEGATIVE
Protein, ur: NEGATIVE mg/dL
Specific Gravity, Urine: 1.02 (ref 1.005–1.030)
pH: 7 (ref 5.0–8.0)

## 2016-12-31 LAB — PREGNANCY, URINE: Preg Test, Ur: NEGATIVE

## 2016-12-31 LAB — URINALYSIS, MICROSCOPIC (REFLEX): RBC / HPF: NONE SEEN RBC/hpf (ref 0–5)

## 2016-12-31 MED ORDER — ACETAMINOPHEN 500 MG PO TABS
1000.0000 mg | ORAL_TABLET | Freq: Once | ORAL | Status: AC
  Administered 2016-12-31: 1000 mg via ORAL
  Filled 2016-12-31: qty 2

## 2016-12-31 MED ORDER — ACETAMINOPHEN 500 MG PO TABS: 500.0000 mg | ORAL_TABLET | Freq: Four times a day (QID) | ORAL | 0 refills | Status: DC | PRN

## 2016-12-31 MED ORDER — IBUPROFEN 200 MG PO TABS
600.0000 mg | ORAL_TABLET | Freq: Once | ORAL | Status: AC
  Administered 2016-12-31: 600 mg via ORAL
  Filled 2016-12-31: qty 1

## 2016-12-31 MED ORDER — IBUPROFEN 600 MG PO TABS: 600.0000 mg | ORAL_TABLET | Freq: Four times a day (QID) | ORAL | 0 refills | Status: DC | PRN

## 2016-12-31 NOTE — ED Provider Notes (Signed)
[facility]-EMERGENCY DEPT Provider Note   CSN: [id number] Arrival date & time: 12/31/16  1359     History   Chief Complaint Chief Complaint  Patient presents with  . Generalized Body Aches  . Nausea    HPI Sabrina Brennan is a 26 y.o. female presents to ED with myalgias, headache, nausea and sweats since this morning.  Patient was bit by dog on right breast and bilateral lower extremities on 12/26/16.  She has received first shot of rabies prophylaxis and is on augmentin.  Patient reports increased pain and swelling around bite wounds, no bleeding or discharge.  Patient was told to take muscle relaxer for this but notes it is not helping. No NSAIDs used.  Dog vaccines were UTD. Patient is very anxious about symptoms and recent dog bit.   No associated fever, weakness, anorexia, sore throat, vomiting, paresthesias, pharyngeal spasms or throat tightness, choking. No urinary or vaginal symptoms.     HPI  History reviewed. No pertinent past medical history.  Patient Active Problem List   Diagnosis Date Noted  . Breast mass in female 08/29/2013    History reviewed. No pertinent surgical history.  OB History    Gravida Para Term Preterm AB Living   0             SAB TAB Ectopic Multiple Live Births                  Obstetric Comments   1st Menstrual Cycle:  12       Home Medications    Prior to Admission medications   Medication Sig Start Date End Date Taking? Authorizing Provider  azithromycin (ZITHROMAX Z-PAK) 250 MG tablet Take 2 tablets (500 mg) on  Day 1,  followed by 1 tablet (250 mg) once daily on Days 2 through 5. 05/08/15   Beers, Charmayne Sheerharles M, PA-C  butalbital-acetaminophen-caffeine (FIORICET) [phone] MG tablet Take 1-2 tablets by mouth every 6 (six) hours as needed for headache. 06/24/15   Joni ReiningSmith, Ronald K, PA-C  dicyclomine (BENTYL) 20 MG tablet Take 1 tablet (20 mg total) by mouth 3 (three) times daily as needed for spasms. 01/25/16   Jennye MoccasinQuigley, Brian S, MD    fexofenadine-pseudoephedrine (ALLEGRA-D) 60-120 MG 12 hr tablet Take 1 tablet by mouth 2 (two) times daily. 06/24/15   Joni ReiningSmith, Ronald K, PA-C  fluticasone (FLONASE) 50 MCG/ACT nasal spray Place 2 sprays into both nostrils daily. 05/27/15 05/26/16  Joni ReiningSmith, Ronald K, PA-C  fluticasone (FLONASE) 50 MCG/ACT nasal spray Place 2 sprays into both nostrils daily. 06/24/15 06/23/16  Joni ReiningSmith, Ronald K, PA-C  MedroxyPROGESTERone Acetate 150 MG/ML SUSY Inject 1 mL into the muscle every 3 (three) months. 01/01/16   [provider]  ondansetron (ZOFRAN) 4 MG tablet Take 1 tablet (4 mg total) by mouth every 8 (eight) hours as needed for nausea or vomiting. 01/25/16   Jennye MoccasinQuigley, Brian S, MD  predniSONE (STERAPRED UNI-PAK 21 TAB) 10 MG (21) TBPK tablet Take 6 tablets on day 1 Take 5 tablets on day 2 Take 4 tablets on day 3 Take 3 tablets on day 4 Take 2 tablets on day 5 Take 1 tablet on day 6 05/23/16   Triplett, Cari B, FNP  promethazine (PHENERGAN) 25 MG tablet Take 1 tablet (25 mg total) by mouth every 6 (six) hours as needed for nausea or vomiting. 05/08/15   Beers, Charmayne Sheerharles M, PA-C  pseudoephedrine (SUDAFED) 60 MG tablet Take 1 tablet (60 mg total) by mouth every 4 (four) hours  as needed for congestion. 05/08/15   Beers, Charmayne Sheer, PA-C  sulfamethoxazole-trimethoprim (BACTRIM DS,SEPTRA DS) 800-160 MG tablet Take 1 tablet by mouth 2 (two) times daily. 06/24/15   Joni Reining, PA-C  traMADol (ULTRAM) 50 MG tablet Take 1 tablet (50 mg total) by mouth every 6 (six) hours as needed. 01/01/16   Triplett, Kasandra Knudsen, FNP    Family History No family history on file.  Social History Social History  Substance Use Topics  . Smoking status: Never Smoker  . Smokeless tobacco: Never Used  . Alcohol use 1.0 oz/week    2 Standard drinks or equivalent per week     Allergies   Patient has no known allergies.   Review of Systems Review of Systems  Constitutional: Positive for diaphoresis. Negative for appetite  change, chills and fever.  HENT: Negative for congestion, sore throat and trouble swallowing.   Eyes: Negative for photophobia and visual disturbance.  Respiratory: Negative for cough, choking, chest tightness and shortness of breath.   Cardiovascular: Negative for chest pain and palpitations.  Gastrointestinal: Positive for nausea. Negative for abdominal pain, constipation, diarrhea and vomiting.  Genitourinary: Negative for difficulty urinating, dysuria, frequency, hematuria and urgency.  Musculoskeletal: Positive for myalgias.  Skin: Positive for wound.  Allergic/Immunologic: Negative for immunocompromised state.  Neurological: Positive for headaches. Negative for tremors, seizures, syncope, weakness, light-headedness and numbness.  Psychiatric/Behavioral: Negative for confusion.     Physical Exam Updated Vital Signs BP 117/74 (BP Location: Right Arm)   Pulse 82   Temp 98.7 F (37.1 C) (Oral)   Resp 18   SpO2 100%   Physical Exam  Constitutional: She appears well-developed and well-nourished. No distress.  Anxious appearing.   HENT:  Head: Normocephalic and atraumatic.  Mouth/Throat: Oropharynx is clear and moist.  Eyes: Conjunctivae and EOM are normal. Pupils are equal, round, and reactive to light.  Neck:  Anterior neck is supple Full active ROM of neck without pain or rigidity   Cardiovascular: Normal rate, regular rhythm, S1 normal, S2 normal and intact distal pulses.   No murmur heard. Pulses:      Carotid pulses are 2+ on the right side, and 2+ on the left side.      Radial pulses are 2+ on the right side, and 2+ on the left side.       Dorsalis pedis pulses are 2+ on the right side, and 2+ on the left side.       Posterior tibial pulses are 2+ on the right side, and 2+ on the left side.  Pulmonary/Chest: Effort normal and breath sounds normal. No respiratory distress.  Abdominal: Soft. She exhibits no distension. There is tenderness in the suprapubic area. There is  no rigidity, no rebound, no guarding and no CVA tenderness.    Musculoskeletal: She exhibits no edema.  No knee erythema, edema or fluctuance. Full passive ROM of bilateral knees. Patient ambulating in ED with right sided antalgic gait.   Neurological: She is alert.  5/5 strength with hip flexion and extension, bilaterally.  5/5 strength with knee flexion and extension, bilaterally.  5/5 strength with ankle dorsiflexion and plantar flexion, bilaterally.  Sensation to light touch intact in the distribution of the obturator nerve, lateral cutaneous nerve, femoral nerve, common fibular nerve.  Feet: sensation to light touch intact in the distribution of the saphenous nerve, medial plantar nerve, lateral plantar nerve, bilaterally.  2/4 knee DTR bilaterally.    Skin: Skin is warm and dry. Bruising noted.  Small puncture wounds to posterior left calf and anterior left thigh with mild local erythema, edema and ecchymosis. Appropriately tender. No bleeding, purulence, fluctuance. No signs of abscess or infection. Surrounding muscles are supple.   Psychiatric: She has a normal mood and affect.  Nursing note and vitals reviewed.    ED Treatments / Results  Labs (all labs ordered are listed, but only abnormal results are displayed) Labs Reviewed  COMPREHENSIVE METABOLIC PANEL - Abnormal; Notable for the following:       Result Value   Glucose, Bld 105 (*)    ALT 12 (*)    All other components within normal limits  URINALYSIS, ROUTINE W REFLEX MICROSCOPIC - Abnormal; Notable for the following:    APPearance HAZY (*)    Hgb urine dipstick TRACE (*)    Leukocytes, UA TRACE (*)    All other components within normal limits  URINALYSIS, MICROSCOPIC (REFLEX) - Abnormal; Notable for the following:    Bacteria, UA RARE (*)    Squamous Epithelial / LPF 6-30 (*)    All other components within normal limits  CBC  PREGNANCY, URINE  POC URINE PREG, ED    EKG  EKG Interpretation None        Radiology No results found.  Procedures Procedures (including critical care time)  Medications Ordered in ED Medications  acetaminophen (TYLENOL) tablet 1,000 mg (1,000 mg Oral Given 12/31/16 1952)  ibuprofen (ADVIL,MOTRIN) tablet 600 mg (600 mg Oral Given 12/31/16 1952)     Initial Impression / Assessment and Plan / ED Course  I have reviewed the triage vital signs and the nursing notes.  Pertinent labs & imaging results that were available during my care of the patient were reviewed by me and considered in my medical decision making (see chart for details).    26 yo female presents with nausea, light-headedness and myalgias worse around leg wounds.  Patient bit by dog recently, is on augment and rabies prophylaxis. Has been taking muscle relaxers without relief of bite wound swelling and pain.   VS wnl. Mild suprapubic tenderness, no urinary or vaginal symptoms. No signs of wound infection, cellulitis, knee septic arthritis. Surrounding muscles are supple, low suspicion for compartment syndrome.  Normal DTR in lower extremities. CMP, CBC, u/a and urine preg negative.    Final Clinical Impressions(s) / ED Diagnoses   Final diagnoses:  Nausea in adult  Body aches  Dog bite, subsequent encounter   Unclear etiology of symptoms. Patient seemed anxious about dog bite, wound infection and rabies symptoms. Very low suspicion for these as patient is on rabies ppx, augmentin and physical exam reassuring. Patient thought muscle relaxers would help pain from bite wounds, has not been taking NSAIDs. Will discharge with high dose NSAIDs, wound care instructions and f/u for rabies vaccines. ED return precautions given. Patient verbalized udnerstanding and is agreeable with plan.   New Prescriptions New Prescriptions   No medications on file     Jerrell Mylar 12/31/16 2045    Melene Plan, DO 12/31/16 2055

## 2016-12-31 NOTE — ED Notes (Signed)
Pt verbalized understanding discharge instructions and denies any further needs or questions at this time. VS stable, ambulatory and steady gait.   

## 2016-12-31 NOTE — ED Triage Notes (Signed)
Pt reports she was seen here last night and given her first series of rabies injections for a dog bite she received on Sunday. Reports this am she woke up with nausea, generalized body aches and some dizziness. Pt a/ox4, resp e/u, nad.

## 2016-12-31 NOTE — ED Notes (Signed)
Pt updated on wait times 

## 2016-12-31 NOTE — Discharge Instructions (Signed)
Your doctor by wounds are inflamed but do not look infected. Continue taking your prescribed antibiotic, this will prevent a wound infection. Please follow-up with your next rabies vaccine, this will decrease the risk of you getting rabies.  Her nausea may be due to the antibiotic, I recommend that you eat before you take it.  Monitor your bite wounds, return if you notice yellow discharge or bleeding, worsening pain, muscle tightness.

## 2017-01-02 ENCOUNTER — Encounter (HOSPITAL_COMMUNITY): Payer: Self-pay

## 2017-01-02 ENCOUNTER — Emergency Department (HOSPITAL_COMMUNITY)
Admission: EM | Admit: 2017-01-02 | Discharge: 2017-01-02 | Disposition: A | Discharge status: Home / Self Care | Payer: BLUE CROSS/BLUE SHIELD | Attending: Emergency Medicine | Admitting: Emergency Medicine

## 2017-01-02 DIAGNOSIS — Z23 Encounter for immunization: Secondary | ICD-10-CM | POA: Insufficient documentation

## 2017-01-02 DIAGNOSIS — Z203 Contact with and (suspected) exposure to rabies: Secondary | ICD-10-CM | POA: Diagnosis not present

## 2017-01-02 DIAGNOSIS — Z2914 Encounter for prophylactic rabies immune globin: Secondary | ICD-10-CM | POA: Insufficient documentation

## 2017-01-02 MED ORDER — RABIES VACCINE, PCEC IM SUSR
1.0000 mL | Freq: Once | INTRAMUSCULAR | Status: AC
  Administered 2017-01-02: 1 mL via INTRAMUSCULAR
  Filled 2017-01-02: qty 1

## 2017-01-02 NOTE — ED Notes (Signed)
Declined W/C at D/C and was escorted to lobby by RN. 

## 2017-01-02 NOTE — Discharge Instructions (Signed)
Return on 7th or go to Urgent Care for repeat vaccination.

## 2017-01-02 NOTE — ED Triage Notes (Signed)
Pt as returned for her second round of rabies. Pt reports some headaches since the bite and some sore throat.

## 2017-01-07 ENCOUNTER — Encounter (HOSPITAL_COMMUNITY): Payer: Self-pay | Admitting: Emergency Medicine

## 2017-01-07 ENCOUNTER — Emergency Department (HOSPITAL_COMMUNITY)
Admission: EM | Admit: 2017-01-07 | Discharge: 2017-01-07 | Disposition: A | Discharge status: Home / Self Care | Payer: BLUE CROSS/BLUE SHIELD | Attending: Emergency Medicine | Admitting: Emergency Medicine

## 2017-01-07 DIAGNOSIS — Z23 Encounter for immunization: Secondary | ICD-10-CM

## 2017-01-07 DIAGNOSIS — W540XXD Bitten by dog, subsequent encounter: Secondary | ICD-10-CM

## 2017-01-07 MED ORDER — RABIES VACCINE, PCEC IM SUSR
1.0000 mL | Freq: Once | INTRAMUSCULAR | Status: AC
  Administered 2017-01-07: 1 mL via INTRAMUSCULAR
  Filled 2017-01-07: qty 1

## 2017-01-07 NOTE — ED Provider Notes (Signed)
[facility]EM[facility]ENCY [facility]PT Provider Note   CSN: [id number] Arrival date & time: 01/07/17  7829  By signing my name below, I, Rosana Fret, attest that this documentation has been prepared under the direction and in the presence of non-physician practitioner, Duanne Limerick, PA-C. Electronically Signed: Rosana Fret, ED Scribe. 01/07/17. 9:58 AM.  History   Chief Complaint No chief complaint on file.  The history is provided by the patient. No language interpreter was used.   HPI Comments: Sabrina Brennan is a 26 y.o. female who presents to the Emergency Department complaining of wounds from animal bites to the bilateral lower extremities that occurred 12 days ago. Pt states she was bit by a dog and has finished her Augmentin series. Per pt, she has received 2 rabies shots already and is here for her third.  Pt reports associated drainage from the wound on her right lower leg. No fevers. No redness around wound. Minimal pain. Rates 2/10. Throbbing. No other complaints at this time.  No past medical history on file.  Patient Active Problem List   Diagnosis Date Noted  . Breast mass in female 08/29/2013    No past surgical history on file.  OB History    Gravida Para Term Preterm AB Living   0             SAB TAB Ectopic Multiple Live Births                  Obstetric Comments   1st Menstrual Cycle:  12       Home Medications    Prior to Admission medications   Medication Sig Start Date End Date Taking? Authorizing Provider  acetaminophen (TYLENOL) 500 MG tablet Take 1 tablet (500 mg total) by mouth every 6 (six) hours as needed. 12/31/16   Liberty Handy, PA-C  azithromycin (ZITHROMAX Z-PAK) 250 MG tablet Take 2 tablets (500 mg) on  Day 1,  followed by 1 tablet (250 mg) once daily on Days 2 through 5. 05/08/15   Beers, Charmayne Sheer, PA-C  butalbital-acetaminophen-caffeine (FIORICET) [phone] MG tablet Take 1-2 tablets by mouth every 6 (six) hours as needed for headache. 06/24/15    Joni Reining, PA-C  dicyclomine (BENTYL) 20 MG tablet Take 1 tablet (20 mg total) by mouth 3 (three) times daily as needed for spasms. 01/25/16   Jennye Moccasin, MD  fexofenadine-pseudoephedrine (ALLEGRA-D) 60-120 MG 12 hr tablet Take 1 tablet by mouth 2 (two) times daily. 06/24/15   Joni Reining, PA-C  fluticasone (FLONASE) 50 MCG/ACT nasal spray Place 2 sprays into both nostrils daily. 05/27/15 05/26/16  Joni Reining, PA-C  fluticasone (FLONASE) 50 MCG/ACT nasal spray Place 2 sprays into both nostrils daily. 06/24/15 06/23/16  Joni Reining, PA-C  ibuprofen (ADVIL,MOTRIN) 600 MG tablet Take 1 tablet (600 mg total) by mouth every 6 (six) hours as needed. 12/31/16   Liberty Handy, PA-C  MedroxyPROGESTERone Acetate 150 MG/ML SUSY Inject 1 mL into the muscle every 3 (three) months. 01/01/16   [provider]  ondansetron (ZOFRAN) 4 MG tablet Take 1 tablet (4 mg total) by mouth every 8 (eight) hours as needed for nausea or vomiting. 01/25/16   Jennye Moccasin, MD  predniSONE (STERAPRED UNI-PAK 21 TAB) 10 MG (21) TBPK tablet Take 6 tablets on day 1 Take 5 tablets on day 2 Take 4 tablets on day 3 Take 3 tablets on day 4 Take 2 tablets on day 5 Take 1 tablet on day  6 05/23/16   Triplett, Cari B, FNP  promethazine (PHENERGAN) 25 MG tablet Take 1 tablet (25 mg total) by mouth every 6 (six) hours as needed for nausea or vomiting. 05/08/15   Beers, Charmayne Sheerharles M, PA-C  pseudoephedrine (SUDAFED) 60 MG tablet Take 1 tablet (60 mg total) by mouth every 4 (four) hours as needed for congestion. 05/08/15   Beers, Charmayne Sheerharles M, PA-C  sulfamethoxazole-trimethoprim (BACTRIM DS,SEPTRA DS) 800-160 MG tablet Take 1 tablet by mouth 2 (two) times daily. 06/24/15   Joni ReiningSmith, Ronald K, PA-C  traMADol (ULTRAM) 50 MG tablet Take 1 tablet (50 mg total) by mouth every 6 (six) hours as needed. 01/01/16   Triplett, Kasandra Knudsenari B, FNP    Family History No family history on file.  Social History Social History  Substance  Use Topics  . Smoking status: Never Smoker  . Smokeless tobacco: Never Used  . Alcohol use 1.0 oz/week    2 Standard drinks or equivalent per week     Allergies   Patient has no known allergies.   Review of Systems Review of Systems  Constitutional: Negative for chills and fever.  Skin: Positive for wound.  Neurological: Negative for numbness.     Physical Exam Updated Vital Signs BP 104/71 (BP Location: Right Arm)   Pulse 73   Temp 97.7 F (36.5 C) (Oral)   Resp 20   SpO2 98%   Physical Exam  Constitutional: She is oriented to person, place, and time. She appears well-developed and well-nourished.  HENT:  Head: Normocephalic and atraumatic.  Cardiovascular: Normal rate.   Pulmonary/Chest: Effort normal.  Neurological: She is alert and oriented to person, place, and time.  Skin: Skin is warm and dry. No erythema.  Right posterior upper calf with well healed puncture wounds from animal bite. No erythema or signs of secondary infection. No drainage. Mild TTP.   Psychiatric: She has a normal mood and affect.  Nursing note and vitals reviewed.  ED Treatments / Results  DIAGNOSTIC STUDIES: Oxygen Saturation is 98% on RA, normal by my interpretation.   COORDINATION OF CARE: 9:58 AM-Discussed next steps with pt including next rabies shot. Pt verbalized understanding and is agreeable with the plan.   Labs (all labs ordered are listed, but only abnormal results are displayed) Labs Reviewed - No data to display  EKG  EKG Interpretation None       Radiology No results found.  Procedures Procedures (including critical care time)  Medications Ordered in ED Medications - No data to display   Initial Impression / Assessment and Plan / ED Course  I have reviewed the triage vital signs and the nursing notes.  Pertinent labs & imaging results that were available during my care of the patient were reviewed by me and considered in my medical decision making (see  chart for details).     Final Clinical Impressions(s) / ED Diagnoses     {I have reviewed the relevant previous healthcare records.  {I obtained HPI from historian.   ED Course:  Assessment: Pt is a 26 y.o. female who presents for rabies shots. Seen in ED for same for rabies vaccination. Bite occurred 12 days ago. This is her third injection. Received immunoglobulin on day 1. On exam, pt in NAD. Nontoxic/nonseptic appearing. VSS. Afebrile. Wound on posterior proximal right calf well healed. No erythema. No purulence. Given rabies vaccination in ED. Plan is to DC home with follow up with PCP. At time of discharge, Patient is in no acute distress.  Vital Signs are stable. Patient is able to ambulate. Patient able to tolerate PO.   Disposition/Plan:  DC Home Additional Verbal discharge instructions given and discussed with patient.  Pt Instructed to f/u with PCP in the next week for evaluation and treatment of symptoms. Return precautions given Pt acknowledges and agrees with plan  Supervising Physician Tilden Fossa, MD  Final diagnoses:  Dog bite, subsequent encounter  Need for rabies vaccination    New Prescriptions New Prescriptions   No medications on file   I personally performed the services described in this documentation, which was scribed in my presence. The recorded information has been reviewed and is accurate.    Audry Pili, PA-C 01/07/17 1002    Tilden Fossa, MD 01/08/17 [phone]

## 2017-01-07 NOTE — ED Notes (Signed)
Med ordered from pharmacy.

## 2017-01-07 NOTE — ED Notes (Signed)
[facility] but was [facility] so came here today.

## 2017-01-07 NOTE — Discharge Instructions (Signed)
Please read and follow all provided instructions.  Your diagnoses today include:  1. Dog bite, subsequent encounter   2. Need for rabies vaccination     Tests performed today include: Vital signs. See below for your results today.   Medications prescribed:  Take as prescribed   Home care instructions:  Follow any educational materials contained in this packet.  Follow-up instructions: Please follow-up with your primary care provider for further evaluation of symptoms and treatment   Return instructions:  Please return to the Emergency Department if you do not get better, if you get worse, or new symptoms OR  - Fever (temperature greater than 101.40F)  - Bleeding that does not stop with holding pressure to the area    -Severe pain (please note that you may be more sore the day after your accident)  - Chest Pain  - Difficulty breathing  - Severe nausea or vomiting  - Inability to tolerate food and liquids  - Passing out  - Skin becoming red around your wounds  - Change in mental status (confusion or lethargy)  - New numbness or weakness    Please return if you have any other emergent concerns.  Additional Information:  Your vital signs today were: BP 104/71 (BP Location: Right Arm)    Pulse 73    Temp 97.7 F (36.5 C) (Oral)    Resp 20    Wt 59.9 kg (132 lb)    SpO2 98%    BMI 27.59 kg/m  If your blood pressure (BP) was elevated above 135/85 this visit, please have this repeated by your doctor within one month. ---------------

## 2017-01-17 ENCOUNTER — Encounter (HOSPITAL_COMMUNITY): Payer: Self-pay | Admitting: Emergency Medicine

## 2017-01-17 ENCOUNTER — Emergency Department (HOSPITAL_COMMUNITY)
Admission: EM | Admit: 2017-01-17 | Discharge: 2017-01-17 | Disposition: A | Discharge status: Home / Self Care | Payer: BLUE CROSS/BLUE SHIELD | Attending: Emergency Medicine | Admitting: Emergency Medicine

## 2017-01-17 DIAGNOSIS — Z5321 Procedure and treatment not carried out due to patient leaving prior to being seen by health care provider: Secondary | ICD-10-CM | POA: Insufficient documentation

## 2017-01-17 DIAGNOSIS — Z203 Contact with and (suspected) exposure to rabies: Secondary | ICD-10-CM | POA: Insufficient documentation

## 2017-01-17 NOTE — ED Triage Notes (Signed)
Pt reports being bitten by dog on 12/26/16, states is here todat for last rabies injection. Pt states has been getting ful course [facility].

## 2017-01-17 NOTE — ED Notes (Signed)
Pt. Stated, Im here for a rabies shot and I don't want to wait any longer, I have another regular appt. I have to be at a certain time and can not wait any longer.  Stated to pt. It should not be much longer.  Pt. Took her BP cuff off and left.

## 2017-09-12 ENCOUNTER — Encounter (HOSPITAL_COMMUNITY): Payer: Self-pay | Admitting: Emergency Medicine

## 2017-09-12 ENCOUNTER — Emergency Department (HOSPITAL_COMMUNITY)
Admission: EM | Admit: 2017-09-12 | Discharge: 2017-09-12 | Disposition: A | Discharge status: Home / Self Care | Payer: BLUE CROSS/BLUE SHIELD | Attending: Emergency Medicine | Admitting: Emergency Medicine

## 2017-09-12 ENCOUNTER — Other Ambulatory Visit: Payer: Self-pay

## 2017-09-12 DIAGNOSIS — R000 Tachycardia, unspecified: Secondary | ICD-10-CM | POA: Insufficient documentation

## 2017-09-12 DIAGNOSIS — R51 Headache: Secondary | ICD-10-CM | POA: Insufficient documentation

## 2017-09-12 DIAGNOSIS — Z79899 Other long term (current) drug therapy: Secondary | ICD-10-CM | POA: Insufficient documentation

## 2017-09-12 DIAGNOSIS — R112 Nausea with vomiting, unspecified: Secondary | ICD-10-CM | POA: Diagnosis present

## 2017-09-12 DIAGNOSIS — R6889 Other general symptoms and signs: Secondary | ICD-10-CM

## 2017-09-12 LAB — BASIC METABOLIC PANEL
Anion gap: 13 (ref 5–15)
BUN: 9 mg/dL (ref 6–20)
CO2: 18 mmol/L — ABNORMAL LOW (ref 22–32)
Calcium: 9 mg/dL (ref 8.9–10.3)
Chloride: 107 mmol/L (ref 101–111)
Creatinine, Ser: 0.83 mg/dL (ref 0.44–1.00)
GFR calc Af Amer: >60 mL/min (ref >60)
GFR calc non Af Amer: >60 mL/min (ref >60)
Glucose, Bld: 101 mg/dL — ABNORMAL HIGH (ref 65–99)
Potassium: 3.9 mmol/L (ref 3.5–5.1)
Sodium: 138 mmol/L (ref 135–145)

## 2017-09-12 LAB — CBC WITH DIFFERENTIAL/PLATELET
Basophils Absolute: 0 10*3/uL (ref 0.0–0.1)
Basophils Relative: 0 %
Eosinophils Absolute: 0.1 10*3/uL (ref 0.0–0.7)
Eosinophils Relative: 1 %
HCT: 43.4 % (ref 36.0–46.0)
Hemoglobin: 14.8 g/dL (ref 12.0–15.0)
Lymphocytes Relative: 11 %
Lymphs Abs: 1 10*3/uL (ref 0.7–4.0)
MCH: 31.7 pg (ref 26.0–34.0)
MCHC: 34.1 g/dL (ref 30.0–36.0)
MCV: 92.9 fL (ref 78.0–100.0)
Monocytes Absolute: 0.4 10*3/uL (ref 0.1–1.0)
Monocytes Relative: 4 %
Neutro Abs: 8.2 10*3/uL — ABNORMAL HIGH (ref 1.7–7.7)
Neutrophils Relative %: 84 %
Platelets: 361 10*3/uL (ref 150–400)
RBC: 4.67 MIL/uL (ref 3.87–5.11)
RDW: 12.1 % (ref 11.5–15.5)
WBC: 9.7 10*3/uL (ref 4.0–10.5)

## 2017-09-12 LAB — URINALYSIS, ROUTINE W REFLEX MICROSCOPIC
Bilirubin Urine: NEGATIVE
Glucose, UA: NEGATIVE mg/dL
Hgb urine dipstick: NEGATIVE
Ketones, ur: NEGATIVE mg/dL
Leukocytes, UA: NEGATIVE
Nitrite: NEGATIVE
Protein, ur: NEGATIVE mg/dL
Specific Gravity, Urine: 1.01 (ref 1.005–1.030)
pH: 5 (ref 5.0–8.0)

## 2017-09-12 LAB — I-STAT BETA HCG BLOOD, ED (MC, WL, AP ONLY): I-stat hCG, quantitative: <5 m[IU]/mL (ref <5)

## 2017-09-12 MED ORDER — SODIUM CHLORIDE 0.9 % IV BOLUS (SEPSIS)
2000.0000 mL | Freq: Once | INTRAVENOUS | Status: AC
  Administered 2017-09-12: 2000 mL via INTRAVENOUS

## 2017-09-12 MED ORDER — METOCLOPRAMIDE HCL 10 MG PO TABS: 10.0000 mg | ORAL_TABLET | Freq: Four times a day (QID) | ORAL | 0 refills | Status: DC

## 2017-09-12 MED ORDER — OSELTAMIVIR PHOSPHATE 75 MG PO CAPS
75.0000 mg | ORAL_CAPSULE | Freq: Once | ORAL | Status: AC
  Administered 2017-09-12: 75 mg via ORAL
  Filled 2017-09-12: qty 1

## 2017-09-12 MED ORDER — METOCLOPRAMIDE HCL 5 MG/ML IJ SOLN
10.0000 mg | Freq: Once | INTRAMUSCULAR | Status: AC
  Administered 2017-09-12: 10 mg via INTRAVENOUS
  Filled 2017-09-12: qty 2

## 2017-09-12 MED ORDER — OSELTAMIVIR PHOSPHATE 75 MG PO CAPS: 75.0000 mg | ORAL_CAPSULE | Freq: Two times a day (BID) | ORAL | 0 refills | Status: DC

## 2017-09-12 MED ORDER — KETOROLAC TROMETHAMINE 30 MG/ML IJ SOLN
15.0000 mg | Freq: Once | INTRAMUSCULAR | Status: AC
  Administered 2017-09-12: 15 mg via INTRAVENOUS
  Filled 2017-09-12: qty 1

## 2017-09-12 MED ORDER — IBUPROFEN 600 MG PO TABS: 600.0000 mg | ORAL_TABLET | Freq: Four times a day (QID) | ORAL | 0 refills | Status: DC | PRN

## 2017-09-12 NOTE — Discharge Instructions (Signed)
Push fluids. Take medications as prescribed for flu like symptoms. Return here with any shortness of breath, difficulty breathing, uncontrolled or severe abdominal pain, high fever or for new concern.

## 2017-09-12 NOTE — ED Triage Notes (Signed)
Pt reports flu like S/S since Saturday. Fever/chills, body aches, sore throat, emesis, cough.

## 2017-09-12 NOTE — ED Provider Notes (Signed)
[facility] Note   CSN: [id number] Arrival date & time: 09/12/17  0243     History   Chief Complaint Chief Complaint  Patient presents with  . Flu like S/S    HPI Sabrina Brennan is a 27 y.o. female.  Patient presents for evaluation of severe body ache, chills, fever, congestion, vomiting and diarrhea. She became sick one day ago. She reports multiple family member with documented influenza illness, including an aunt she has been the caregiver for. She continues to drink fluids and have normal urination. No dysuria. She reports abdominal cramping pain that is worse prior to bowel movement or vomiting but does not go away in between. She feels her abdomen is distended.    The history is provided by the patient. No language interpreter was used.    History reviewed. No pertinent past medical history.  Patient Active Problem List   Diagnosis Date Noted  . Breast mass in female 08/29/2013    History reviewed. No pertinent surgical history.  OB History    Gravida Para Term Preterm AB Living   0             SAB TAB Ectopic Multiple Live Births                  Obstetric Comments   1st Menstrual Cycle:  12       Home Medications    Prior to Admission medications   Medication Sig Start Date End Date Taking? Authorizing Provider  acetaminophen (TYLENOL) 500 MG tablet Take 1 tablet (500 mg total) by mouth every 6 (six) hours as needed. 12/31/16   Liberty HandyGibbons, Claudia J, PA-C  azithromycin (ZITHROMAX Z-PAK) 250 MG tablet Take 2 tablets (500 mg) on  Day 1,  followed by 1 tablet (250 mg) once daily on Days 2 through 5. 05/08/15   Beers, Charmayne Sheerharles M, PA-C  butalbital-acetaminophen-caffeine (FIORICET) [phone] MG tablet Take 1-2 tablets by mouth every 6 (six) hours as needed for headache. 06/24/15   Joni ReiningSmith, Ronald K, PA-C  dicyclomine (BENTYL) 20 MG tablet Take 1 tablet (20 mg total) by mouth 3 (three) times daily as needed for spasms.  01/25/16   Jennye MoccasinQuigley, Brian S, MD  fexofenadine-pseudoephedrine (ALLEGRA-D) 60-120 MG 12 hr tablet Take 1 tablet by mouth 2 (two) times daily. 06/24/15   Joni ReiningSmith, Ronald K, PA-C  fluticasone (FLONASE) 50 MCG/ACT nasal spray Place 2 sprays into both nostrils daily. 05/27/15 05/26/16  Joni ReiningSmith, Ronald K, PA-C  fluticasone (FLONASE) 50 MCG/ACT nasal spray Place 2 sprays into both nostrils daily. 06/24/15 06/23/16  Joni ReiningSmith, Ronald K, PA-C  ibuprofen (ADVIL,MOTRIN) 600 MG tablet Take 1 tablet (600 mg total) by mouth every 6 (six) hours as needed. 12/31/16   Liberty HandyGibbons, Claudia J, PA-C  MedroxyPROGESTERone Acetate 150 MG/ML SUSY Inject 1 mL into the muscle every 3 (three) months. 01/01/16   [provider]  ondansetron (ZOFRAN) 4 MG tablet Take 1 tablet (4 mg total) by mouth every 8 (eight) hours as needed for nausea or vomiting. 01/25/16   Jennye MoccasinQuigley, Brian S, MD  predniSONE (STERAPRED UNI-PAK 21 TAB) 10 MG (21) TBPK tablet Take 6 tablets on day 1 Take 5 tablets on day 2 Take 4 tablets on day 3 Take 3 tablets on day 4 Take 2 tablets on day 5 Take 1 tablet on day 6 05/23/16   Triplett, Cari B, FNP  promethazine (PHENERGAN) 25 MG tablet Take 1 tablet (25 mg total) by mouth every 6 (  six) hours as needed for nausea or vomiting. 05/08/15   Beers, Charmayne Sheer, PA-C  pseudoephedrine (SUDAFED) 60 MG tablet Take 1 tablet (60 mg total) by mouth every 4 (four) hours as needed for congestion. 05/08/15   Beers, Charmayne Sheer, PA-C  sulfamethoxazole-trimethoprim (BACTRIM DS,SEPTRA DS) 800-160 MG tablet Take 1 tablet by mouth 2 (two) times daily. 06/24/15   Joni Reining, PA-C  traMADol (ULTRAM) 50 MG tablet Take 1 tablet (50 mg total) by mouth every 6 (six) hours as needed. 01/01/16   Triplett, Cari B, FNP  sodium chloride (OCEAN) 0.65 % SOLN nasal spray Place 1 spray into both nostrils as needed for congestion. Patient not taking: Reported on 02/06/2015 06/23/14 05/08/15  Rolla Plate    Family History No family history on  file.  Social History Social History   Tobacco Use  . Smoking status: Never Smoker  . Smokeless tobacco: Never Used  Substance Use Topics  . Alcohol use: Yes    Alcohol/week: 1.0 oz    Types: 2 Standard drinks or equivalent per week  . Drug use: No     Allergies   Patient has no known allergies.   Review of Systems Review of Systems  Constitutional: Negative for chills and fever.  HENT: Positive for congestion, rhinorrhea and sore throat.   Respiratory: Positive for cough.   Cardiovascular: Negative.   Gastrointestinal: Positive for abdominal pain, diarrhea, nausea and vomiting.  Genitourinary: Negative for decreased urine volume and dysuria.  Musculoskeletal: Positive for myalgias.  Skin: Negative.   Neurological: Positive for headaches.     Physical Exam Updated Vital Signs BP (!) 120/95   Pulse 100   Temp 98.4 F (36.9 C) (Oral)   Resp 17   Ht 4\' 11"  (1.499 m)   Wt 61.2 kg (135 lb)   SpO2 100%   BMI 27.27 kg/m   Physical Exam  Constitutional: She is oriented to person, place, and time. She appears well-developed and well-nourished.  HENT:  Head: Normocephalic.  Mouth/Throat: Uvula is midline. Mucous membranes are dry. No oropharyngeal exudate, posterior oropharyngeal edema or posterior oropharyngeal erythema.  Neck: Normal range of motion. Neck supple.  Cardiovascular: Regular rhythm. Tachycardia present.  No murmur heard. Pulmonary/Chest: Effort normal and breath sounds normal. She has no wheezes. She has no rales.  Abdominal: Soft. Bowel sounds are normal. There is tenderness (periumbilical). There is no rebound and no guarding.  Musculoskeletal: Normal range of motion. She exhibits no edema.  Neurological: She is alert and oriented to person, place, and time.  Skin: Skin is warm and dry. No rash noted.  Psychiatric: She has a normal mood and affect.     ED Treatments / Results  Labs (all labs ordered are listed, but only abnormal results are  displayed) Labs Reviewed  BASIC METABOLIC PANEL  CBC WITH DIFFERENTIAL/PLATELET  URINALYSIS, ROUTINE W REFLEX MICROSCOPIC  I-STAT BETA HCG BLOOD, ED ([facility], [facility], [facility] ONLY)    EKG  EKG Interpretation None       Radiology No results found.  Procedures Procedures (including critical care time)  Medications Ordered in ED Medications  sodium chloride 0.9 % bolus 2,000 mL (not administered)  metoCLOPramide (REGLAN) injection 10 mg (not administered)  ketorolac (TORADOL) 30 MG/ML injection 15 mg (not administered)     Initial Impression / Assessment and Plan / ED Course  I have reviewed the triage vital signs and the nursing notes.  Pertinent labs & imaging results that were available during my care  of the patient were reviewed by me and considered in my medical decision making (see chart for details).     Patient presents with flu-like symptoms and reports close contact with influenza at home. Will treat as if positive. Tamiflu started.  IVF's provided after which she reports feeling much better. Lab studies are reassuring. VSS. Will provide reglan for home use, continuation of Tamiflu. Return precautions discussed.   Final Clinical Impressions(s) / ED Diagnoses   Final diagnoses:  None   1. Influenza like illness   ED Discharge Orders    None       Elpidio Anis, Cordelia Poche 09/12/17 1610    Devoria Albe, MD 09/12/17 [phone]

## 2017-11-09 ENCOUNTER — Ambulatory Visit (HOSPITAL_COMMUNITY)
Admission: EM | Admit: 2017-11-09 | Discharge: 2017-11-09 | Disposition: A | Discharge status: Home / Self Care | Payer: BLUE CROSS/BLUE SHIELD | Attending: Family Medicine | Admitting: Family Medicine

## 2017-11-09 ENCOUNTER — Encounter (HOSPITAL_COMMUNITY): Payer: Self-pay | Admitting: Emergency Medicine

## 2017-11-09 DIAGNOSIS — L03032 Cellulitis of left toe: Secondary | ICD-10-CM

## 2017-11-09 MED ORDER — SULFAMETHOXAZOLE-TRIMETHOPRIM 800-160 MG PO TABS: 1.0000 | ORAL_TABLET | Freq: Two times a day (BID) | ORAL | 0 refills | Status: AC

## 2017-11-09 NOTE — ED Triage Notes (Signed)
Pt c/o pain and possibly infection next to the nail bed of her L big toe.

## 2017-11-12 NOTE — ED Provider Notes (Signed)
  [facility]   [id number] 11/09/17 Arrival Time: 1919  ASSESSMENT & PLAN:  1. Paronychia of great toe, left     Meds ordered this encounter  Medications  . sulfamethoxazole-trimethoprim (BACTRIM DS,SEPTRA DS) 800-160 MG tablet    Sig: Take 1 tablet by mouth 2 (two) times daily for 10 days.    Dispense:  20 tablet    Refill:  0   No I&D needed. Suspect paronychia in early stages.  Wound care instructions discussed and given in written format. To return in 48 hours for wound check if needed.  Finish all antibiotics. OTC analgesics as needed.  Reviewed expectations re: course of current medical issues. Questions answered. Outlined signs and symptoms indicating need for more acute intervention. Patient verbalized understanding. After Visit Summary given.   SUBJECTIVE:  Sabrina Brennan is a 27 y.o. female who questions possible infection of L great toe near nail. Noticed discomfort around edge of L great toenail. Mild erythema. No drainage. Afebrile. Able to wear her normal shoes. No specific aggravating or alleviating factors reported. No OTC treatment.  ROS: As per HPI.  OBJECTIVE:  Vitals:   11/09/17 1932  BP: 115/79  Pulse: 68  Resp: 18  Temp: 98.5 F (36.9 C)  SpO2: 100%    General appearance: alert; no distress Skin: around lateral edge of L great toe, mild erythema and tenderness; no fluctuance; no evidence of ingrown nail Psychological: alert and cooperative; normal mood and affect  No Known Allergies  Social History   Socioeconomic History  . Marital status: Single    Spouse name: Not on file  . Number of children: Not on file  . Years of education: Not on file  . Highest education level: Not on file  Occupational History  . Not on file  Social Needs  . Financial resource strain: Not on file  . Food insecurity:    Worry: Not on file    Inability: Not on file  . Transportation needs:    Medical: Not on file    Non-medical: Not on file    Tobacco Use  . Smoking status: Never Smoker  . Smokeless tobacco: Never Used  Substance and Sexual Activity  . Alcohol use: Yes    Alcohol/week: 1.0 oz    Types: 2 Standard drinks or equivalent per week  . Drug use: No  . Sexual activity: Not on file  Lifestyle  . Physical activity:    Days per week: Not on file    Minutes per session: Not on file  . Stress: Not on file  Relationships  . Social connections:    Talks on phone: Not on file    Gets together: Not on file    Attends religious service: Not on file    Active member of club or organization: Not on file    Attends meetings of clubs or organizations: Not on file    Relationship status: Not on file  Other Topics Concern  . Not on file  Social History Narrative  . Not on file   History reviewed. No pertinent surgical history.         Mardella LaymanHagler, Markeita Alicia, MD 11/12/17 [phone]

## 2018-10-11 ENCOUNTER — Encounter (HOSPITAL_COMMUNITY): Payer: Self-pay | Admitting: Emergency Medicine

## 2018-10-11 ENCOUNTER — Ambulatory Visit (HOSPITAL_COMMUNITY)
Admission: EM | Admit: 2018-10-11 | Discharge: 2018-10-11 | Disposition: A | Discharge status: Home / Self Care | Payer: BLUE CROSS/BLUE SHIELD | Attending: Family Medicine | Admitting: Family Medicine

## 2018-10-11 DIAGNOSIS — B309 Viral conjunctivitis, unspecified: Secondary | ICD-10-CM

## 2018-10-11 MED ORDER — OLOPATADINE HCL 0.1 % OP SOLN: 1.0000 [drp] | Freq: Two times a day (BID) | OPHTHALMIC | 0 refills | Status: DC

## 2018-10-11 NOTE — ED Provider Notes (Signed)
[facility] CARE CENTER   [id number] 10/11/18 Arrival Time: 1202  ASSESSMENT & PLAN:  1. Acute viral conjunctivitis of right eye     Meds ordered this encounter  Medications  . olopatadine (PATANOL) 0.1 % ophthalmic solution    Sig: Place 1 drop into both eyes 2 (two) times daily.    Dispense:  5 mL    Refill:  0    Discussed the diagnosis and proper care of conjunctivitis.  Stressed household Presenter, broadcasting. Ophthalmic drops per orders. Warm compress to eye(s). Local eye care discussed.  Reviewed expectations re: course of current medical issues. Questions answered. Outlined signs and symptoms indicating need for more acute intervention. Patient verbalized understanding. After Visit Summary given.   SUBJECTIVE:  Sabrina Brennan is a 28 y.o. female who presents with complaint of persistent irritation and redness of her R eye. Onset gradual, approximately 2 days ago. Initially of L eye; now improved. Family member with URI symptoms. Injury: no. Visual changes: no. Contact lens use: no. Self treatment: none.  ROS: As per HPI.  OBJECTIVE:  Vitals:   10/11/18 1220  BP: 108/78  Pulse: 86  Resp: 18  Temp: 97.8 F (36.6 C)  SpO2: 100%    General appearance: alert; no distress HEENT: [place]; AT; PERRLA; EOMI; OD with conjunctival injection and watery drainage; without corneal opacities; without limbal flush; without periorbital swelling or erythema Neck: supple without LAD Lungs: clear to auscultation bilaterally; unlabored respirations Heart: regular rate and rhythm Skin: warm and dry Psychological: alert and cooperative; normal mood and affect  No Known Allergies   Social History   Socioeconomic History  . Marital status: Single    Spouse name: Not on file  . Number of children: Not on file  . Years of education: Not on file  . Highest education level: Not on file  Occupational History  . Not on file  Social Needs  . Financial resource strain: Not on file  .  Food insecurity:    Worry: Not on file    Inability: Not on file  . Transportation needs:    Medical: Not on file    Non-medical: Not on file  Tobacco Use  . Smoking status: Never Smoker  . Smokeless tobacco: Never Used  Substance and Sexual Activity  . Alcohol use: Yes    Alcohol/week: 2.0 standard drinks    Types: 2 Standard drinks or equivalent per week  . Drug use: No  . Sexual activity: Not on file  Lifestyle  . Physical activity:    Days per week: Not on file    Minutes per session: Not on file  . Stress: Not on file  Relationships  . Social connections:    Talks on phone: Not on file    Gets together: Not on file    Attends religious service: Not on file    Active member of club or organization: Not on file    Attends meetings of clubs or organizations: Not on file    Relationship status: Not on file  . Intimate partner violence:    Fear of current or ex partner: Not on file    Emotionally abused: Not on file    Physically abused: Not on file    Forced sexual activity: Not on file  Other Topics Concern  . Not on file  Social History Narrative  . Not on file   No family history on file. History reviewed. No pertinent surgical history.   Mardella Layman, MD 10/11/18 1257

## 2018-10-11 NOTE — ED Triage Notes (Signed)
Pt states on Friday her R eye started itching and burning, now c/o both eyes irritated.

## 2019-01-16 ENCOUNTER — Emergency Department (HOSPITAL_COMMUNITY)
Admission: EM | Admit: 2019-01-16 | Discharge: 2019-01-16 | Disposition: A | Discharge status: Home / Self Care | Payer: Self-pay | Attending: Emergency Medicine | Admitting: Emergency Medicine

## 2019-01-16 ENCOUNTER — Other Ambulatory Visit: Payer: Self-pay

## 2019-01-16 ENCOUNTER — Emergency Department (HOSPITAL_COMMUNITY): Payer: Self-pay

## 2019-01-16 ENCOUNTER — Encounter (HOSPITAL_COMMUNITY): Payer: Self-pay | Admitting: Emergency Medicine

## 2019-01-16 DIAGNOSIS — F419 Anxiety disorder, unspecified: Secondary | ICD-10-CM | POA: Insufficient documentation

## 2019-01-16 DIAGNOSIS — R202 Paresthesia of skin: Secondary | ICD-10-CM | POA: Insufficient documentation

## 2019-01-16 DIAGNOSIS — R200 Anesthesia of skin: Secondary | ICD-10-CM

## 2019-01-16 DIAGNOSIS — R0789 Other chest pain: Secondary | ICD-10-CM | POA: Insufficient documentation

## 2019-01-16 LAB — BASIC METABOLIC PANEL
Anion gap: 12 (ref 5–15)
BUN: 9 mg/dL (ref 6–20)
CO2: 19 mmol/L — ABNORMAL LOW (ref 22–32)
Calcium: 9.1 mg/dL (ref 8.9–10.3)
Chloride: 108 mmol/L (ref 98–111)
Creatinine, Ser: 0.93 mg/dL (ref 0.44–1.00)
GFR calc Af Amer: >60 mL/min (ref >60)
GFR calc non Af Amer: >60 mL/min (ref >60)
Glucose, Bld: 102 mg/dL — ABNORMAL HIGH (ref 70–99)
Potassium: 3.6 mmol/L (ref 3.5–5.1)
Sodium: 139 mmol/L (ref 135–145)

## 2019-01-16 LAB — CBC WITH DIFFERENTIAL/PLATELET
Abs Immature Granulocytes: 0.04 10*3/uL (ref 0.00–0.07)
Basophils Absolute: 0 10*3/uL (ref 0.0–0.1)
Basophils Relative: 0 %
Eosinophils Absolute: 0.2 10*3/uL (ref 0.0–0.5)
Eosinophils Relative: 2 %
HCT: 40.8 % (ref 36.0–46.0)
Hemoglobin: 13.3 g/dL (ref 12.0–15.0)
Immature Granulocytes: 0 %
Lymphocytes Relative: 21 %
Lymphs Abs: 2.2 10*3/uL (ref 0.7–4.0)
MCH: 31 pg (ref 26.0–34.0)
MCHC: 32.6 g/dL (ref 30.0–36.0)
MCV: 95.1 fL (ref 80.0–100.0)
Monocytes Absolute: 0.8 10*3/uL (ref 0.1–1.0)
Monocytes Relative: 8 %
Neutro Abs: 7.2 10*3/uL (ref 1.7–7.7)
Neutrophils Relative %: 69 %
Platelets: 353 10*3/uL (ref 150–400)
RBC: 4.29 MIL/uL (ref 3.87–5.11)
RDW: 12.1 % (ref 11.5–15.5)
WBC: 10.4 10*3/uL (ref 4.0–10.5)
nRBC: 0 % (ref 0.0–0.2)

## 2019-01-16 LAB — TROPONIN I: Troponin I: <0.03 ng/mL (ref <0.03)

## 2019-01-16 LAB — D-DIMER, QUANTITATIVE: D-Dimer, Quant: <0.27 ug/mL-FEU (ref 0.00–0.50)

## 2019-01-16 NOTE — Discharge Instructions (Signed)
Begin taking the anxiety medication prescribed by your doctor this morning.  If this is not helping, it may be useful to start taking Pepcid (famotidine) as prescribed over-the-counter for possible reflux.  Please return to the emergency department if you develop any new or worsening symptoms.

## 2019-01-16 NOTE — ED Provider Notes (Addendum)
[facility] EMERGENCY DEPARTMENT Provider Note   CSN: [id number] Arrival date & time: 01/16/19  1152    History   Chief Complaint Chief Complaint  Patient presents with   Chest Pain   Numbness    HPI Sabrina Routeyasia A Dejoseph is a 28 y.o. female who is previously healthy who presents with 3-day history of central chest pain that is intermittent.  It is described as sharp.  She has had a 1 day history of arm numbness that is also intermittent.  She reports it started on the left side and has now been to the right.  She describes it as tingling.  She denies any other associated symptoms including shortness of breath, cough, fever, abdominal pain, nausea, vomiting.  Patient took Tylenol at home with some relief.  She reports that she just took a trip to [place] last week and drove 9 hours in the car for her grandmother's funeral.  She is also been under a lot of stress related to her parents recent divorce.  Patient receives Depo-Provera injection.  She denies any recent surgeries, known cancer, new leg pain or swelling, or history of blood clots.  Patient was prescribed medication for headache and anxiety medicine at PCP this morning.     HPI  History reviewed. No pertinent past medical history.  Patient Active Problem List   Diagnosis Date Noted   Breast mass in female 08/29/2013    History reviewed. No pertinent surgical history.   OB History    Gravida  0   Para      Term      Preterm      AB      Living        SAB      TAB      Ectopic      Multiple      Live Births           Obstetric Comments  1st Menstrual Cycle:  14         Home Medications    Prior to Admission medications   Medication Sig Start Date End Date Taking? Authorizing Provider  acetaminophen (TYLENOL) 500 MG tablet Take 1 tablet (500 mg total) by mouth every 6 (six) hours as needed. Patient taking differently: Take 500 mg by mouth every 6 (six) hours as needed for  mild pain.  12/31/16  Yes Liberty HandyGibbons, Claudia J, PA-C  aspirin-acetaminophen-caffeine (EXCEDRIN MIGRAINE) [phone] MG tablet Take 2 tablets by mouth every 6 (six) hours as needed for headache.   Yes [provider]    Family History No family history on file.  Social History Social History   Tobacco Use   Smoking status: Never Smoker   Smokeless tobacco: Never Used  Substance Use Topics   Alcohol use: Yes    Alcohol/week: 2.0 standard drinks    Types: 2 Standard drinks or equivalent per week   Drug use: No     Allergies   Patient has no known allergies.   Review of Systems Review of Systems  Constitutional: Negative for chills and fever.  HENT: Negative for facial swelling and sore throat.   Respiratory: Negative for shortness of breath.   Cardiovascular: Positive for chest pain.  Gastrointestinal: Negative for abdominal pain, nausea and vomiting.  Genitourinary: Negative for dysuria.  Musculoskeletal: Negative for back pain.  Skin: Negative for rash and wound.  Neurological: Positive for numbness. Negative for headaches.  Psychiatric/Behavioral: The patient is not nervous/anxious.  Physical Exam Updated Vital Signs BP 123/85    Pulse 70    Temp 98.8 F (37.1 C) (Oral)    Resp (!) 22    Ht 4\' 11"  (1.499 m)    Wt 71.2 kg    SpO2 99%    BMI 31.71 kg/m   Physical Exam Vitals signs and nursing note reviewed.  Constitutional:      General: She is not in acute distress.    Appearance: She is well-developed. She is not diaphoretic.  HENT:     Head: Normocephalic and atraumatic.     Mouth/Throat:     Pharynx: No oropharyngeal exudate.  Eyes:     General: No scleral icterus.       Right eye: No discharge.        Left eye: No discharge.     Conjunctiva/sclera: Conjunctivae normal.     Pupils: Pupils are equal, round, and reactive to light.  Neck:     Musculoskeletal: Normal range of motion and neck supple.     Thyroid: No thyromegaly.    Cardiovascular:     Rate and Rhythm: Normal rate and regular rhythm.     Heart sounds: Normal heart sounds. No murmur. No friction rub. No gallop.   Pulmonary:     Effort: Pulmonary effort is normal. No respiratory distress.     Breath sounds: Normal breath sounds. No stridor. No wheezing or rales.  Chest:     Chest wall: Tenderness present.    Abdominal:     General: Bowel sounds are normal. There is no distension.     Palpations: Abdomen is soft.     Tenderness: There is no abdominal tenderness. There is no guarding or rebound.  Lymphadenopathy:     Cervical: No cervical adenopathy.  Skin:    General: Skin is warm and dry.     Coloration: Skin is not pale.     Findings: No rash.  Neurological:     Mental Status: She is alert.     Coordination: Coordination normal.      ED Treatments / Results  Labs (all labs ordered are listed, but only abnormal results are displayed) Labs Reviewed  BASIC METABOLIC PANEL - Abnormal; Notable for the following components:      Result Value   CO2 19 (*)    Glucose, Bld 102 (*)    All other components within normal limits  CBC WITH DIFFERENTIAL/PLATELET  TROPONIN I  D-DIMER, QUANTITATIVE (NOT AT [facility])    EKG EKG Interpretation  Date/Time:  Tuesday January 16 2019 12:07:00 EDT Ventricular Rate:  75 PR Interval:    QRS Duration: 91 QT Interval:  371 QTC Calculation: 415 R Axis:   61 Text Interpretation:  Sinus rhythm Normal ECG Confirmed by Carmin Muskrat [phone]) on 01/16/2019 12:22:30 PM   Radiology Dg Chest 2 View  Result Date: 01/16/2019 CLINICAL DATA:  Chest pain, arm numbness EXAM: CHEST - 2 VIEW COMPARISON:  09/07/2015 FINDINGS: Low volume examination with bibasilar atelectasis. No acute appearing airspace opacity. The heart, mediastinum, and osseous structures are unremarkable. IMPRESSION: Low volume examination with bibasilar atelectasis. No acute appearing airspace opacity. Electronically Signed   By: Eddie Candle M.D.    On: 01/16/2019 13:35    Procedures Procedures (including critical care time)  Medications Ordered in ED Medications - No data to display   Initial Impression / Assessment and Plan / ED Course  I have reviewed the triage vital signs and the nursing notes.  Pertinent labs &  imaging results that were available during my care of the patient were reviewed by me and considered in my medical decision making (see chart for details).        Patient presenting with 3 days of intermittent chest pain and intermittent arm numbness.  She has been undergoing a lot of stress lately related to her parents divorce and her grandmother dying.  Troponin and d-dimer negative.  CBC, BMP unremarkable except for CO2 19.  Chest pain is reproducible on palpation.  Patient's PCP already prescribed anxiety medication this morning, which she has not picked up yet.  I advised this would be a good idea to try and also counseling.  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  I also discussed possible GERD considering patient symptoms have been worse at night at times, however she does not relate them to any food.  I advised after trying anxiety medication she can try over-the-counter Pepcid as needed.  Follow-up to PCP for further management.  Return precautions discussed.  Patient understands and agrees with plan.  Patient vital stable throughout ED course and discharged in satisfactory condition. I discussed patient case with Dr. Jeraldine LootsLockwood who guided the patient's management and agrees with plan.  Sabrina Brennan was evaluated in Emergency Department on 01/16/2019 for the symptoms described in the history of present illness. She was evaluated in the context of the global COVID-19 pandemic, which necessitated consideration that the patient might be at risk for infection with the SARS-CoV-2 virus that causes COVID-19. Institutional protocols and algorithms that pertain to the  evaluation of patients at risk for COVID-19 are in a state of rapid change based on information released by regulatory bodies including the CDC and federal and state organizations. These policies and algorithms were followed during the patient's care in the ED.   Final Clinical Impressions(s) / ED Diagnoses   Final diagnoses:  Atypical chest pain  Numbness  Anxiety    ED Discharge Orders    None          Emi HolesLaw, Alma Muegge M, PA-C 01/16/19 1457    Gerhard MunchLockwood, Robert, MD 01/16/19 1550

## 2019-01-16 NOTE — ED Triage Notes (Signed)
Pt arrives from home. Pt complaining of chest pain x3 days that got worse yesterday. Pt also complaining of arm numbness that began yesterday in the left arm that moved to her right arm this morning. Pt seen at PCP this morning for same.

## 2019-01-16 NOTE — ED Notes (Signed)
Patient verbalizes understanding of discharge instructions. Opportunity for questioning and answers were provided. Armband removed by staff, pt discharged from ED ambulatory to home.  

## 2019-01-16 NOTE — ED Notes (Signed)
Patient transported to X-ray 

## 2019-04-07 ENCOUNTER — Ambulatory Visit
Admission: EM | Admit: 2019-04-07 | Discharge: 2019-04-07 | Disposition: A | Discharge status: Home / Self Care | Payer: BC Managed Care – PPO | Attending: Physician Assistant | Admitting: Physician Assistant

## 2019-04-07 ENCOUNTER — Other Ambulatory Visit: Payer: Self-pay

## 2019-04-07 DIAGNOSIS — N898 Other specified noninflammatory disorders of vagina: Secondary | ICD-10-CM | POA: Diagnosis not present

## 2019-04-07 DIAGNOSIS — R112 Nausea with vomiting, unspecified: Secondary | ICD-10-CM | POA: Diagnosis not present

## 2019-04-07 DIAGNOSIS — R197 Diarrhea, unspecified: Secondary | ICD-10-CM

## 2019-04-07 MED ORDER — ONDANSETRON 4 MG PO TBDP: 4.0000 mg | ORAL_TABLET | Freq: Three times a day (TID) | ORAL | 0 refills | Status: DC | PRN

## 2019-04-07 MED ORDER — DICYCLOMINE HCL 20 MG PO TABS: 20.0000 mg | ORAL_TABLET | Freq: Two times a day (BID) | ORAL | 0 refills | Status: DC

## 2019-04-07 MED ORDER — FLUCONAZOLE 150 MG PO TABS: 150.0000 mg | ORAL_TABLET | Freq: Every day | ORAL | 0 refills | Status: DC

## 2019-04-07 MED ORDER — OMEPRAZOLE 20 MG PO CPDR: 20.0000 mg | DELAYED_RELEASE_CAPSULE | Freq: Every day | ORAL | 0 refills | Status: DC

## 2019-04-07 NOTE — ED Triage Notes (Signed)
Patient's symptoms started Thursday evening with chills and Friday morning developed into vomiting and diarrhea. Patient's current complaint is persistent diarrhea as well as abdominal pain. Per patient she ate pasta she believes could've caused onset, then had pizza that exacerbated symptoms. Patient has tried Tylenol with no relief.

## 2019-04-07 NOTE — ED Provider Notes (Signed)
[facility]    CSN: [id number] Arrival date & time: 04/07/19  0808      History   Chief Complaint Chief Complaint  Patient presents with  . Nausea  . Emesis  . Diarrhea    HPI Sabrina Brennan is a 28 y.o. female.   28 year old female comes in for 2-day history of nausea, vomiting, diarrhea.  States she had chills the first day but has since resolved.  She has had 2 episodes of nonbilious nonbloody vomit, 6 episodes of diarrhea.  Denies melena, hematochezia.  Nausea has since improved, and has been able to tolerate oral intake without difficulty.  She feels as if "food is sitting in my stomach", and points to the epigastric region.  Denies URI symptoms such as cough, congestion, sore throat.  Denies fever, body aches.  Took Tylenol without relief.  No obvious sick contact.     No past medical history on file.  Patient Active Problem List   Diagnosis Date Noted  . Breast mass in female 08/29/2013    No past surgical history on file.  OB History    Gravida  0   Para      Term      Preterm      AB      Living        SAB      TAB      Ectopic      Multiple      Live Births           Obstetric Comments  1st Menstrual Cycle:  14         Home Medications    Prior to Admission medications   Medication Sig Start Date End Date Taking? Authorizing Provider  acetaminophen (TYLENOL) 500 MG tablet Take 1 tablet (500 mg total) by mouth every 6 (six) hours as needed. Patient taking differently: Take 500 mg by mouth every 6 (six) hours as needed for mild pain.  12/31/16   Liberty HandyGibbons, Claudia J, PA-C  aspirin-acetaminophen-caffeine (EXCEDRIN MIGRAINE) [phone] MG tablet Take 2 tablets by mouth every 6 (six) hours as needed for headache.    [provider]  dicyclomine (BENTYL) 20 MG tablet Take 1 tablet (20 mg total) by mouth 2 (two) times daily. 04/07/19   Cathie HoopsYu, Brentney Goldbach V, PA-C  fluconazole (DIFLUCAN) 150 MG tablet Take 1 tablet (150 mg total) by  mouth daily. Take second dose 72 hours later if symptoms still persists. 04/07/19   Cathie HoopsYu, Yasamin Karel V, PA-C  omeprazole (PRILOSEC) 20 MG capsule Take 1 capsule (20 mg total) by mouth daily. 04/07/19   Cathie HoopsYu, Mikaya Bunner V, PA-C  ondansetron (ZOFRAN ODT) 4 MG disintegrating tablet Take 1 tablet (4 mg total) by mouth every 8 (eight) hours as needed for nausea or vomiting. 04/07/19   Belinda FisherYu, Milderd Manocchio V, PA-C    Family History No family history on file.  Social History Social History   Tobacco Use  . Smoking status: Never Smoker  . Smokeless tobacco: Never Used  Substance Use Topics  . Alcohol use: Yes    Alcohol/week: 2.0 standard drinks    Types: 2 Standard drinks or equivalent per week  . Drug use: No     Allergies   Patient has no known allergies.   Review of Systems Review of Systems  Reason unable to perform ROS: See HPI as above.     Physical Exam Triage Vital Signs ED Triage Vitals [04/07/19 0821]  Enc Vitals Group  BP 118/84     Pulse Rate 69     Resp 16     Temp 98.2 F (36.8 C)     Temp Source Oral     SpO2 98 %     Weight      Height      Head Circumference      Peak Flow      Pain Score 7     Pain Loc      Pain Edu?      Excl. in GC?    No data found.  Updated Vital Signs BP 118/84 (BP Location: Left Arm)   Pulse 69   Temp 98.2 F (36.8 C) (Oral)   Resp 16   SpO2 98%   Physical Exam Constitutional:      General: She is not in acute distress.    Appearance: She is well-developed. She is not ill-appearing, toxic-appearing or diaphoretic.  HENT:     Head: Normocephalic and atraumatic.  Eyes:     Conjunctiva/sclera: Conjunctivae normal.     Pupils: Pupils are equal, round, and reactive to light.  Cardiovascular:     Rate and Rhythm: Normal rate and regular rhythm.     Heart sounds: Normal heart sounds. No murmur. No friction rub. No gallop.   Pulmonary:     Effort: Pulmonary effort is normal.     Breath sounds: Normal breath sounds. No wheezing or rales.  Abdominal:      General: Bowel sounds are normal.     Palpations: Abdomen is soft.     Tenderness: There is abdominal tenderness in the right lower quadrant and left upper quadrant. There is no right CVA tenderness, left CVA tenderness, guarding or rebound.  Skin:    General: Skin is warm and dry.  Neurological:     Mental Status: She is alert and oriented to person, place, and time.  Psychiatric:        Behavior: Behavior normal.        Judgment: Judgment normal.     UC Treatments / Results  Labs (all labs ordered are listed, but only abnormal results are displayed) Labs Reviewed - No data to display  EKG   Radiology No results found.  Procedures Procedures (including critical care time)  Medications Ordered in UC Medications - No data to display  Initial Impression / Assessment and Plan / UC Course  I have reviewed the triage vital signs and the nursing notes.  Pertinent labs & imaging results that were available during my care of the patient were reviewed by me and considered in my medical decision making (see chart for details).    Discussed with patient no alarming signs on exam. Patient with mild RLQ tenderness on palpation without rebound/guarding. At this time, lower suspicion for appendicitis, will have patient continue to monitor. Zofran for nausea. Omeprazole for possible acid reflux. Bentyl for abdominal cramping. Push fluids. Bland diet, advance as tolerated. Return precautions given.  At discharge, patient mentioned she has had vaginal itching and mild cottage cheese like discharge since switching soaps. Will add diflucan for possible yeast infection.  Final Clinical Impressions(s) / UC Diagnoses   Final diagnoses:  Nausea vomiting and diarrhea    ED Prescriptions    Medication Sig Dispense Auth. Provider   ondansetron (ZOFRAN ODT) 4 MG disintegrating tablet Take 1 tablet (4 mg total) by mouth every 8 (eight) hours as needed for nausea or vomiting. 10 tablet Cathie Hoops, Delsa Walder  V, PA-C   dicyclomine (  BENTYL) 20 MG tablet Take 1 tablet (20 mg total) by mouth 2 (two) times daily. 20 tablet Kebrina Friend V, PA-C   omeprazole (PRILOSEC) 20 MG capsule Take 1 capsule (20 mg total) by mouth daily. 10 capsule Dawnetta Copenhaver V, PA-C   fluconazole (DIFLUCAN) 150 MG tablet Take 1 tablet (150 mg total) by mouth daily. Take second dose 72 hours later if symptoms still persists. 2 tablet Tobin Chad, PA-C 04/07/19 1027

## 2019-04-07 NOTE — Discharge Instructions (Addendum)
Zofran for nausea and vomiting as needed. Omeprazole for possible acid reflux causing symptoms. Bentyl for abdominal cramping. Keep hydrated, you urine should be clear to pale yellow in color. Bland diet, advance as tolerated. Monitor for any worsening of symptoms, nausea or vomiting not controlled by medication, worsening abdominal pain (especially right lower quadrant), fever, go to the ED for further evaluation. If develop cold symptoms such as cough, congestion, loss of taste/smell, shortness of breath, follow up for COVID testing.

## 2019-04-23 ENCOUNTER — Encounter: Payer: Self-pay | Admitting: Obstetrics and Gynecology

## 2019-05-10 ENCOUNTER — Ambulatory Visit (HOSPITAL_COMMUNITY)
Admission: EM | Admit: 2019-05-10 | Discharge: 2019-05-10 | Disposition: A | Discharge status: Home / Self Care | Payer: BC Managed Care – PPO | Attending: Family Medicine | Admitting: Family Medicine

## 2019-05-10 ENCOUNTER — Other Ambulatory Visit: Payer: Self-pay

## 2019-05-10 ENCOUNTER — Encounter (HOSPITAL_COMMUNITY): Payer: Self-pay | Admitting: Emergency Medicine

## 2019-05-10 ENCOUNTER — Ambulatory Visit (INDEPENDENT_AMBULATORY_CARE_PROVIDER_SITE_OTHER): Payer: BC Managed Care – PPO

## 2019-05-10 DIAGNOSIS — M25862 Other specified joint disorders, left knee: Secondary | ICD-10-CM | POA: Diagnosis not present

## 2019-05-10 MED ORDER — IBUPROFEN 800 MG PO TABS: 800.0000 mg | ORAL_TABLET | Freq: Three times a day (TID) | ORAL | 0 refills | Status: DC

## 2019-05-10 NOTE — ED Provider Notes (Signed)
[facility]    CSN: [id number] Arrival date & time: 05/10/19  1434      History   Chief Complaint Chief Complaint  Patient presents with  . Appointment    250  . Knee Pain    HPI Sabrina Brennan is a 28 y.o. female.   HPI  Patient states for the last few days she has been having knee pain.  Is always the left knee.  At times when she is going down the stairs she feels like a pop.  Sometimes it just feels creaking and sore.  Never had any knee injury.  She states that last night while trying to go up the stairs she felt a significant pop and was immobilized with severe pain.  She did not note any deformity of the knee.  She took 600 mg of ibuprofen put ice on the knee.  Took 600 mg again this morning.  Still cannot fully flex the knee.  She has warmth.  She has swelling.  No fall or trauma History reviewed. No pertinent past medical history.  Patient Active Problem List   Diagnosis Date Noted  . Breast mass in female 08/29/2013    History reviewed. No pertinent surgical history.  OB History    Gravida  0   Para      Term      Preterm      AB      Living        SAB      TAB      Ectopic      Multiple      Live Births           Obstetric Comments  1st Menstrual Cycle:  14         Home Medications    Prior to Admission medications   Medication Sig Start Date End Date Taking? Authorizing Provider  acetaminophen (TYLENOL) 500 MG tablet Take 1 tablet (500 mg total) by mouth every 6 (six) hours as needed. Patient taking differently: Take 500 mg by mouth every 6 (six) hours as needed for mild pain.  12/31/16   Liberty Handy, PA-C  aspirin-acetaminophen-caffeine (EXCEDRIN MIGRAINE) [phone] MG tablet Take 2 tablets by mouth every 6 (six) hours as needed for headache.    [provider]  dicyclomine (BENTYL) 20 MG tablet Take 1 tablet (20 mg total) by mouth 2 (two) times daily. 04/07/19   Cathie Hoops, Amy V, PA-C  ibuprofen (ADVIL) 800 MG  tablet Take 1 tablet (800 mg total) by mouth 3 (three) times daily. 05/10/19   Eustace Moore, MD  omeprazole (PRILOSEC) 20 MG capsule Take 1 capsule (20 mg total) by mouth daily. 04/07/19   Cathie Hoops, Amy V, PA-C  ondansetron (ZOFRAN ODT) 4 MG disintegrating tablet Take 1 tablet (4 mg total) by mouth every 8 (eight) hours as needed for nausea or vomiting. 04/07/19   Belinda Fisher, PA-C    Family History History reviewed. No pertinent family history.  Social History Social History   Tobacco Use  . Smoking status: Never Smoker  . Smokeless tobacco: Never Used  Substance Use Topics  . Alcohol use: Yes    Alcohol/week: 2.0 standard drinks    Types: 2 Standard drinks or equivalent per week  . Drug use: No     Allergies   Patient has no known allergies.   Review of Systems Review of Systems  Constitutional: Negative for chills and fever.  HENT: Negative for ear  pain and sore throat.   Eyes: Negative for pain and visual disturbance.  Respiratory: Negative for cough and shortness of breath.   Cardiovascular: Negative for chest pain and palpitations.  Gastrointestinal: Negative for abdominal pain and vomiting.  Genitourinary: Negative for dysuria and hematuria.  Musculoskeletal: Positive for arthralgias and gait problem. Negative for back pain.  Skin: Negative for color change and rash.  Neurological: Negative for seizures and syncope.  All other systems reviewed and are negative.    Physical Exam Triage Vital Signs ED Triage Vitals  Enc Vitals Group     BP 05/10/19 1510 121/87     Pulse Rate 05/10/19 1510 83     Resp 05/10/19 1510 18     Temp 05/10/19 1510 98.9 F (37.2 C)     Temp Source 05/10/19 1510 Temporal     SpO2 05/10/19 1510 99 %     Weight --      Height --      Head Circumference --      Peak Flow --      Pain Score 05/10/19 1511 8     Pain Loc --      Pain Edu? --      Excl. in GC? --    No data found.  Updated Vital Signs BP 121/87 (BP Location: Right Arm)    Pulse 83   Temp 98.9 F (37.2 C) (Temporal)   Resp 18   SpO2 99%      Physical Exam Constitutional:      General: She is not in acute distress.    Appearance: She is well-developed.  HENT:     Head: Normocephalic and atraumatic.  Eyes:     Conjunctiva/sclera: Conjunctivae normal.     Pupils: Pupils are equal, round, and reactive to light.  Neck:     Musculoskeletal: Normal range of motion.  Cardiovascular:     Rate and Rhythm: Normal rate.  Pulmonary:     Effort: Pulmonary effort is normal. No respiratory distress.  Abdominal:     General: There is no distension.     Palpations: Abdomen is soft.  Musculoskeletal: Normal range of motion.     Comments: Patient holds knee in extension.  Will flex only 20 to 30 degrees.  No instability is noted although stability exam is limited by pain limiting behavior.  There is tenderness over the anterior patella and medial patellar areas.  Patellofemoral grind testing is positive.  Knee is warm.  No effusion  Skin:    General: Skin is warm and dry.  Neurological:     General: No focal deficit present.     Mental Status: She is alert.  Psychiatric:        Mood and Affect: Mood normal.        Behavior: Behavior normal.      UC Treatments / Results  Labs (all labs ordered are listed, but only abnormal results are displayed) Labs Reviewed - No data to display  EKG   Radiology Dg Knee Ap/lat W/sunrise Left  Result Date: 05/10/2019 CLINICAL DATA:  Patellar pain, no injury EXAM: LEFT KNEE 3 VIEWS COMPARISON:  None. FINDINGS: No fracture or dislocation of the left knee. Joint spaces are well preserved. No knee joint effusion. Soft tissues are unremarkable. IMPRESSION: No fracture or dislocation of the left knee. Joint spaces are well preserved. No knee joint effusion. Electronically Signed   By: Lauralyn PrimesAlex  Bibbey M.D.   On: 05/10/2019 16:17    Procedures Procedures (including  critical care time)  Medications Ordered in UC Medications -  No data to display  Initial Impression / Assessment and Plan / UC Course  I have reviewed the triage vital signs and the nursing notes.  Pertinent labs & imaging results that were available during my care of the patient were reviewed by me and considered in my medical decision making (see chart for details).     Demonstrated patient straight leg raising.  Discussed strengthening quadriceps to try to help prevent anterior knee pain Final Clinical Impressions(s) / UC Diagnoses   Final diagnoses:  Patellofemoral dysfunction, left     Discharge Instructions     Continue ice to knee for pain and swelling Take ibuprofen 3 times a day with food Wear brace while up and walking Do your knee exercises 2 times a day Follow-up with orthopedics if not improving by next week     ED Prescriptions    Medication Sig Dispense Auth. Provider   ibuprofen (ADVIL) 800 MG tablet Take 1 tablet (800 mg total) by mouth 3 (three) times daily. 21 tablet Raylene Everts, MD     PDMP not reviewed this encounter.   Raylene Everts, MD 05/10/19 [phone]

## 2019-05-10 NOTE — Discharge Instructions (Addendum)
Continue ice to knee for pain and swelling Take ibuprofen 3 times a day with food Wear brace while up and walking Do your knee exercises 2 times a day Follow-up with orthopedics if not improving by next week

## 2019-05-10 NOTE — ED Triage Notes (Signed)
Pt sts left knee pain after walking up stairs yesterday and feeling knee pop

## 2019-05-17 ENCOUNTER — Encounter: Payer: Self-pay | Admitting: *Deleted

## 2019-05-18 ENCOUNTER — Ambulatory Visit (INDEPENDENT_AMBULATORY_CARE_PROVIDER_SITE_OTHER): Payer: BC Managed Care – PPO | Admitting: Family Medicine

## 2019-05-18 ENCOUNTER — Other Ambulatory Visit: Payer: Self-pay

## 2019-05-18 ENCOUNTER — Encounter: Payer: Self-pay | Admitting: Family Medicine

## 2019-05-18 DIAGNOSIS — M25562 Pain in left knee: Secondary | ICD-10-CM | POA: Diagnosis not present

## 2019-05-18 MED ORDER — NABUMETONE 750 MG PO TABS: 750.0000 mg | ORAL_TABLET | Freq: Two times a day (BID) | ORAL | 6 refills | Status: DC | PRN

## 2019-05-18 NOTE — Progress Notes (Signed)
   Office Visit Note   Patient: Sabrina Brennan           Date of Birth: 05-Jan-1991           MRN: [id number] Visit Date: 05/18/2019 Requested by: Lorelee Market, [place],  [place] [address] PCP: Lorelee Market, MD  Subjective: Chief Complaint  Patient presents with  . Left Knee - Pain    HPI: She is here with left knee pain.  More than a week ago she was walking and felt something pop in her knee.  She has had severe pain since then.  She went to the ER on October 8 and x-rays were negative for bony abnormality, I reviewed them myself this morning.  She was given a hinged knee brace and ibuprofen and she now presents for evaluation.  She still cannot flex her knee beyond 90 degrees without pain.  The knee feels like it locks intermittently.  She is currently working as a Programme researcher, broadcasting/film/video in [employer].  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              ROS: No fevers or chills.  All other systems were reviewed and are negative.  Objective: Vital Signs: There were no vitals taken for this visit.  Physical Exam:  General:  Alert and oriented, in no acute distress. Pulm:  Breathing unlabored. Psy:  Normal mood, congruent affect. Skin: No bruising. Left knee: 1+ effusion with no warmth.  Pain with patella compression, no pain with patella apprehension.  No laxity or pain with varus or valgus stress.  She is extremely tender at the medial joint line and it feels like she has a para meniscal cyst.  Lachman's feels solid.  Imaging: None today.  Assessment & Plan: 1.  Left knee pain with effusion and locking, concerning for medial meniscus tear. -MRI to further evaluate.  Surgical consult if indicated.  Prescription for Relafen to take as needed.     Procedures: No procedures performed  No notes on file     PMFS History: Patient Active Problem List   Diagnosis Date Noted  . Breast mass in female 08/29/2013   History  reviewed. No pertinent past medical history.  History reviewed. No pertinent family history.  History reviewed. No pertinent surgical history. Social History   Occupational History  . Not on file  Tobacco Use  . Smoking status: Never Smoker  . Smokeless tobacco: Never Used  Substance and Sexual Activity  . Alcohol use: Yes    Alcohol/week: 2.0 standard drinks    Types: 2 Standard drinks or equivalent per week  . Drug use: No  . Sexual activity: Not on file

## 2019-05-18 NOTE — Progress Notes (Signed)
Left knee pain after going down the stairs and hearing a loud pop. Pain isn't getting any better and continues to swell, went to the urgent care and they obtained xrays of her knee and gave her a knee brace.

## 2019-05-21 ENCOUNTER — Telehealth: Payer: Self-pay | Admitting: Family Medicine

## 2019-05-21 NOTE — Telephone Encounter (Signed)
I called and advised the patient. Letter written and emailed to the patient per request.

## 2019-05-21 NOTE — Telephone Encounter (Signed)
Ok to be OOW until she has MRI scan.

## 2019-05-21 NOTE — Telephone Encounter (Signed)
The patient says she is now starting to have shooting pain up her leg from the knee. She does not feel like she can work today (her shift is 3:30 - 11 pm) due to the pain. Her MRI appointment is still pending. She is asking for an out of work note. How long should she remain out?

## 2019-05-21 NOTE — Telephone Encounter (Signed)
Patient called. She would like a new work note. Says she went to work Saturday and was in a lot of pain after her shift. Her call back number [phone]

## 2019-05-22 ENCOUNTER — Ambulatory Visit: Payer: BC Managed Care – PPO | Admitting: Orthopaedic Surgery

## 2019-05-25 ENCOUNTER — Telehealth: Payer: Self-pay | Admitting: *Deleted

## 2019-05-25 NOTE — Telephone Encounter (Signed)
Patient returned my call.  States she was scheduled to go to [facility] 10/23-10/26/20.  Patient states this paperwork is to allow her to get her money back due to cancelling her travel plans.  Patient had originally canceled her appoitnment on 05/28/19 due to being out of town.  Her appointment has been rescheduled to her original date/time due to no longer being out of town.  Explained to patient we will complete her travel insurance paperwork provided she keeps her appointment on 05/28/19 at 8:55 am.  Patient states understanding and agrees to keep appointment on Monday 05/28/19.

## 2019-05-25 NOTE — Telephone Encounter (Signed)
Telephoned patient.  No answer left message requesting she return my call.  Patient has left paperwork in the office regarding travel insurance.  Need to discuss this further with the patient before we can determine if this can be completed in our office.

## 2019-05-28 ENCOUNTER — Encounter: Payer: Self-pay | Admitting: Family Medicine

## 2019-05-28 ENCOUNTER — Other Ambulatory Visit (HOSPITAL_COMMUNITY)
Admission: RE | Admit: 2019-05-28 | Discharge: 2019-05-28 | Disposition: A | Discharge status: Home / Self Care | Payer: BC Managed Care – PPO | Source: Ambulatory Visit | Attending: Family Medicine | Admitting: Family Medicine

## 2019-05-28 ENCOUNTER — Other Ambulatory Visit: Payer: Self-pay

## 2019-05-28 ENCOUNTER — Ambulatory Visit: Payer: BC Managed Care – PPO | Admitting: Family Medicine

## 2019-05-28 ENCOUNTER — Ambulatory Visit (INDEPENDENT_AMBULATORY_CARE_PROVIDER_SITE_OTHER): Payer: BC Managed Care – PPO | Admitting: Family Medicine

## 2019-05-28 DIAGNOSIS — Z3202 Encounter for pregnancy test, result negative: Secondary | ICD-10-CM | POA: Diagnosis not present

## 2019-05-28 DIAGNOSIS — D069 Carcinoma in situ of cervix, unspecified: Secondary | ICD-10-CM | POA: Diagnosis not present

## 2019-05-28 DIAGNOSIS — Z87410 Personal history of cervical dysplasia: Secondary | ICD-10-CM | POA: Diagnosis not present

## 2019-05-28 LAB — POCT PREGNANCY, URINE: Preg Test, Ur: NEGATIVE

## 2019-05-28 NOTE — Progress Notes (Signed)
   GYNECOLOGY OFFICE PROCEDURE NOTE  Sabrina Brennan is a 28 y.o. G1P0010 here for LEEP. No GYN concerns. Pap smear and colposcopy history reviewed.    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  Role of HPV,cervical dysplasia and need for close followup was empasized. Informed written consent was obtained. All questions were answered. Time out performed. Urine pregnancy test was negative.  Procedure: The patient was placed in lithotomy position and the bivalved coated speculum was placed in the patient's vagina. A grounding pad placed on the patient. Local anesthesia was administered via an intracervical block using 20 ml of 2% Lidocaine with epinephrine. Colposcopy performed. The suction was turned on and the Small 1X Fisher Cone Biopsy Excisor on 39 Watts of blended current was used to excise the area of decreased uptake and excise the entire transformation zone. Excellent hemostasis was achieved using roller ball coagulation set at 60 Watts coagulation current. Monsel's solution was then applied and the speculum was removed from the vagina. Specimens were sent to pathology.  The patient tolerated the procedure well. Post-operative instructions given to patient, including instruction to seek medical attention for persistent bright red bleeding, fever, abdominal/pelvic pain, dysuria, nausea or vomiting. She was also told about the possibility of having copious yellow to black tinged discharge for weeks. She was counseled to avoid anything in the vagina (sex/douching/tampons) for 3 weeks. She has a 4 week post-operative check to assess wound healing, review results and discuss further management.    05/28/2019 10:48 AM Donnamae Jude, MD

## 2019-05-28 NOTE — Patient Instructions (Signed)
Cervical Conization, Care After This sheet gives you information about how to care for yourself after your procedure. Your doctor may also give you more specific instructions. If you have problems or questions, contact your doctor. Follow these instructions at home: Medicines   Take over-the-counter and prescription medicines only as told by your doctor.  Do not take aspirin until your doctor says it is okay.  If you take pain medicine: ? You may have constipation. To help treat this, your doctor may tell you to:  Drink enough fluid to keep your pee (urine) clear or pale yellow.  Take medicines.  Eat foods that are high in fiber. These include fresh fruits and vegetables, whole grains, bran, and beans.  Limit foods that are high in fat and sugar. These include fried foods and sweet foods. ? Do not drive or use heavy machines. General instructions  You can eat your usual diet unless your doctor tells you not to do so.  Take showers for the first week. Do not take baths, swim, or use hot tubs until your doctor says it is okay.  Do not douche, use tampons, or have sex until your doctor says it is okay.  For 7-14 days after your procedure, avoid: ? Being very active. ? Exercising. ? Heavy lifting.  Keep all follow-up visits as told by your doctor. This is important. Contact a doctor if:  You have a rash.  You are dizzy or lightheaded.  You feel sick to your stomach (nauseous).  You throw up (vomit).  You have fluid from your vagina (vaginal discharge) that smells bad. Get help right away if:  There are blood clots coming from your vagina.  You have more bleeding than you would have in a normal period. For example, you soak a pad in less than 1 hour.  You have a fever.  You have more and more cramps.  You pass out (faint).  You have pain when peeing.  Your have a lot of pain.  Your pain gets worse.  Your pain does not get better when you take your medicine.   You have blood in your pee.  You throw up (vomit). Summary  After your procedure, take over-the-counter and prescription medicines only as told by your doctor.  Do not douche, use tampons, or have sex until your doctor says it is okay.  For about 7-14 days after your procedure, try not to exercise or lift heavy objects.  Get help right away if you have new symptoms, or if your symptoms become worse. This information is not intended to replace advice given to you by your health care provider. Make sure you discuss any questions you have with your health care provider. Document Released: 04/27/2008 Document Revised: 07/01/2017 Document Reviewed: 07/21/2016 Elsevier Patient Education  2020 Elsevier Inc.  

## 2019-05-30 ENCOUNTER — Ambulatory Visit: Payer: BC Managed Care – PPO | Admitting: Obstetrics and Gynecology

## 2019-05-30 LAB — SURGICAL PATHOLOGY

## 2019-06-01 ENCOUNTER — Encounter: Payer: Self-pay | Admitting: *Deleted

## 2019-06-05 ENCOUNTER — Telehealth: Payer: Self-pay | Admitting: Family Medicine

## 2019-06-05 ENCOUNTER — Ambulatory Visit
Admission: RE | Admit: 2019-06-05 | Discharge: 2019-06-05 | Disposition: A | Discharge status: Home / Self Care | Payer: BC Managed Care – PPO | Source: Ambulatory Visit | Attending: Family Medicine | Admitting: Family Medicine

## 2019-06-05 ENCOUNTER — Other Ambulatory Visit: Payer: Self-pay

## 2019-06-05 DIAGNOSIS — M25562 Pain in left knee: Secondary | ICD-10-CM

## 2019-06-05 NOTE — Telephone Encounter (Signed)
Patient called back and was advised that Dr. Park Liter office will call her once the results are received. Thank you.

## 2019-06-05 NOTE — Telephone Encounter (Signed)
MRI looks good, no sign of fracture, ligament tear or cartilage tear.

## 2019-06-05 NOTE — Telephone Encounter (Signed)
Patient called wanting MRI results has appt today as [facility]  [phone]

## 2019-06-05 NOTE — Telephone Encounter (Signed)
Yes, can write the note how she wants it.  MRI looks ok, so she can use pain as her guide.

## 2019-06-05 NOTE — Telephone Encounter (Signed)
Pt called in wanting to ask dr.hilts if she could return to work with mild restrictions and if so what would they be? Please give her a call [phone]

## 2019-06-05 NOTE — Telephone Encounter (Signed)
I called and left a message on her voice mail - we will call her with the results once we get the report and Dr. Junius Roads has looked at it. The MRI has not occurred yet.

## 2019-06-05 NOTE — Telephone Encounter (Signed)
Please advise 

## 2019-06-06 ENCOUNTER — Encounter: Payer: Self-pay | Admitting: Physical Therapy

## 2019-06-06 ENCOUNTER — Ambulatory Visit (INDEPENDENT_AMBULATORY_CARE_PROVIDER_SITE_OTHER): Payer: BC Managed Care – PPO | Admitting: Physical Therapy

## 2019-06-06 ENCOUNTER — Other Ambulatory Visit: Payer: Self-pay

## 2019-06-06 ENCOUNTER — Telehealth: Payer: Self-pay | Admitting: Family Medicine

## 2019-06-06 DIAGNOSIS — M25662 Stiffness of left knee, not elsewhere classified: Secondary | ICD-10-CM

## 2019-06-06 DIAGNOSIS — R269 Unspecified abnormalities of gait and mobility: Secondary | ICD-10-CM

## 2019-06-06 DIAGNOSIS — M25562 Pain in left knee: Secondary | ICD-10-CM

## 2019-06-06 NOTE — Patient Instructions (Signed)
Access Code: [email or web address]  URL: [email or web address]  Date: 06/06/2019  Prepared by: Faustino Congress   Exercises  Supine Short Arc Quad - 10 reps - 1 sets - 3 sec hold - 3x daily - 7x weekly  Straight Leg Raise with External Rotation - 10 reps - 1 sets - 2-3 sec hold - 3x daily - 7x weekly  Seated Long Arc Quad with Hip Adduction - 10 reps - 1 sets - 3-4 sec hold - 3x daily - 7x weekly  Patient Education  Trigger Point Dry Needling

## 2019-06-06 NOTE — Telephone Encounter (Signed)
I spoke to the patient in office today. She was here for her 1st PT visit. Letter given for light duty, 3 shifts/week, 11/04 - 07/04/19. On 12/02, she will have PT here and then find me - we will update her work status at that time.

## 2019-06-06 NOTE — Telephone Encounter (Signed)
See other note on this same subject from today.

## 2019-06-06 NOTE — Telephone Encounter (Signed)
Patient is in the process of changing insurances due to the high copay for PT.  Her new insurance will not get in until 07/03/19.  Her copay is $115.00 per visit.  She does not know what to do in the mean time for her PT.  CB#[phone].  Thank you.

## 2019-06-06 NOTE — Therapy (Signed)
[facility] Physical Therapy [address] [place], [place], [address] Phone: [phone]   Fax:  [phone]  Physical Therapy Evaluation  Patient Details  Name: Sabrina Brennan MRN: [id number] Date of Birth: [birth year] Referring Provider (PT): Lavada Mesi, MD   Encounter Date: 06/06/2019  PT End of Session - 06/06/19 1031    Visit Number  1    Number of Visits  12    Date for PT Re-Evaluation  07/18/19    PT Start Time  0930    PT Stop Time  1008    PT Time Calculation (min)  38 min    Activity Tolerance  Patient tolerated treatment well    Behavior During Therapy  [facility] for tasks assessed/performed       History reviewed. No pertinent past medical history.  History reviewed. No pertinent surgical history.  There were no vitals filed for this visit.   Subjective Assessment - 06/06/19 0931    Subjective  Pt is a 28 y/o female who presents to OPPT for Lt knee pain x 1 month without known injury.  Pt reports pain is peristent, and stairs are most challenging.  She c/o frequent "popping" and is out of work currently due to responsibilities.    Limitations  Walking    How long can you walk comfortably?  occasional pain    Diagnostic tests  MRI: negative    Patient Stated Goals  improve pain; "I just want my knee to go back to normal."    Currently in Pain?  Yes    Pain Score  3    up to 10/10; at best 0/10   Pain Location  Knee    Pain Orientation  Left;Medial;Anterior    Pain Descriptors / Indicators  Sharp   "it feels like something's moving"   Pain Type  Acute pain    Pain Onset  More than a month ago    Pain Frequency  Intermittent    Aggravating Factors   stairs, walking    Pain Relieving Factors  ice, heat, rest, elevation, massage         [facility] PT Assessment - 06/06/19 0937      Assessment   Medical Diagnosis  M25.562 (ICD-10-CM) - Acute pain of left knee    Referring Provider (PT)  Hilts, Michael, MD    Onset Date/Surgical Date  --    1 month   Hand Dominance  Right    Next MD Visit  PRN    Prior Therapy  none      Precautions   Precautions  None      Restrictions   Weight Bearing Restrictions  No      Balance Screen   Has the patient fallen in the past 6 months  No    Has the patient had a decrease in activity level because of a fear of falling?   No    Is the patient reluctant to leave their home because of a fear of falling?   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[place], [place]  shopping, prior to [employer] 2x/wk; walking 2x/wk      Cognition   Overall Cognitive Status  Within Functional Limits for tasks assessed      ROM / Strength   AROM / PROM / Strength  AROM;Strength;PROM      AROM   Overall AROM Comments  pt guarding    AROM Assessment Site  Knee    Right/Left Knee  Right;Left    Left Knee Extension  -4    Left Knee Flexion  46      PROM   PROM Assessment Site  Knee    Right/Left Knee  Left    Left Knee Extension  -1    Left Knee Flexion  53      Strength   Overall Strength Comments  deferred due to pain and limited motion/pt guarding      Palpation   Patella mobility  mild lateral tracking - difficult due to guarding    Palpation comment  tender to palpation along medial joint line into pes anserine bursa; lateral quad trigger point and tenderness as well      Ambulation/Gait   Gait Pattern  Decreased hip/knee flexion - left;Antalgic                Objective measurements completed on examination: See above findings.      [facility] Adult PT  Treatment/Exercise - 06/06/19 0937      Exercises   Exercises  Knee/Hip      Knee/Hip Exercises: Seated   Long Arc Quad  Left   3 reps   Long Arc Quad Limitations  with ball squeeze and limited range      Knee/Hip Exercises: Supine   Short Arc Quad Sets  Left;5 reps    Short Arc Quad Sets Limitations  limited range    Straight Leg Raise with External Rotation  Left;5 reps    Straight Leg Raise with External Rotation Limitations  limited range      Manual Therapy   Manual Therapy  Soft tissue mobilization    Soft tissue mobilization  IASTM with biofreeze to Lt lateral quad             PT Education - 06/06/19 1031    Education Details  HEP, DN    Person(s) Educated  Patient    Methods  Explanation;Demonstration;Handout    Comprehension  Verbalized understanding;Returned demonstration;Need further instruction          PT Long Term Goals - 06/06/19 1214      PT LONG TERM GOAL #1   Title  independent with HEP    Status  New    Target Date  07/18/19      PT LONG TERM GOAL #2   Title  Lt knee AROM 0-120 for improved function and mobility    Status  New    Target Date  07/18/19      PT LONG TERM GOAL #3   Title  report pain < 4/10 with amb/standing at least 2 hours in order to return to work    Status  New    Target Date  07/18/19      PT LONG TERM GOAL #4   Title  demonstrate at least 4/5 Lt knee strength for improved function    Status  New    Target  Date  07/18/19             Plan - 06/06/19 1209    Clinical Impression Statement  Pt is a 28 y/o female who presents to OPPT for acute Lt knee pain.  Pt demonstrates decreased strength and ROM with gait abnormalities affecting functional mobility.  Eval limited due to guarding with testing, likely due to pain.  Pt with difficulty isolating quad activation, but improved with cues.  Will benefit from PT to address deficits listed.    Examination-Activity Limitations  Locomotion Level;Squat;Stairs;Stand     Examination-Participation Restrictions  Other   occupation   Stability/Clinical Decision Making  Stable/Uncomplicated    Clinical Decision Making  Moderate    Rehab Potential  Good    PT Frequency  2x / week   1x/wk due to financial concerns, may increase to 2x/wk if able   PT Duration  6 weeks    PT Treatment/Interventions  ADLs/Self Care Home Management;Cryotherapy;Electrical Stimulation;Ultrasound;Moist Heat;Iontophoresis 4mg /ml Dexamethasone;Gait training;Stair training;Functional mobility training;Therapeutic activities;Therapeutic exercise;Patient/family education;Manual techniques;Passive range of motion;Vasopneumatic Device;Taping;Dry needling    PT Next Visit Plan  consider taping to Lt knee; review HEP and work on ROM, manual/modalities PRN, DN to lateral quad    PT Home Exercise Plan  Access Code: [email or web address]    Consulted and Agree with Plan of Care  Patient       Patient will benefit from skilled therapeutic intervention in order to improve the following deficits and impairments:  Abnormal gait, Decreased range of motion, Difficulty walking, Increased fascial restricitons, Increased muscle spasms, Pain, Impaired flexibility, Decreased strength, Decreased mobility  Visit Diagnosis: Acute pain of left knee - Plan: PT plan of care cert/re-cert  Abnormality of gait - Plan: PT plan of care cert/re-cert  Stiffness of left knee - Plan: PT plan of care cert/re-cert     Problem List Patient Active Problem List   Diagnosis Date Noted  . 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 Breast mass in female 08/29/2013      Clarita CraneStephanie F Ezella Kell, PT, DPT 06/06/19 12:19 PM     [facility] [address] [place], [place], [address] Phone: [phone]   Fax:  [phone]  Name: Sabrina Brennan MRN: [id number] Date of Birth: [birth year]

## 2019-06-06 NOTE — Telephone Encounter (Signed)
Patient called to see about getting note to work. Is she to do light duty?  Please call [phone]

## 2019-06-07 NOTE — Telephone Encounter (Signed)
She did.  

## 2019-06-07 NOTE — Telephone Encounter (Signed)
Please see phone call message from [place]. She is coming here for PT. Did the patient say anything to you yesterday about this?

## 2019-06-13 ENCOUNTER — Other Ambulatory Visit: Payer: Self-pay

## 2019-06-13 ENCOUNTER — Encounter: Payer: Self-pay | Admitting: Physical Therapy

## 2019-06-13 ENCOUNTER — Ambulatory Visit (INDEPENDENT_AMBULATORY_CARE_PROVIDER_SITE_OTHER): Payer: BC Managed Care – PPO | Admitting: Physical Therapy

## 2019-06-13 ENCOUNTER — Other Ambulatory Visit: Payer: BC Managed Care – PPO

## 2019-06-13 DIAGNOSIS — M25562 Pain in left knee: Secondary | ICD-10-CM

## 2019-06-13 DIAGNOSIS — M25662 Stiffness of left knee, not elsewhere classified: Secondary | ICD-10-CM

## 2019-06-13 DIAGNOSIS — R269 Unspecified abnormalities of gait and mobility: Secondary | ICD-10-CM

## 2019-06-13 NOTE — Therapy (Signed)
[facility] Physical Therapy [address] [place], [place], [address] Phone: [phone]   Fax:  [phone]  Physical Therapy Treatment  Patient Details  Name: Sabrina Brennan MRN: [id number] Date of Birth: 04/02/1991 Referring Provider (PT): Lavada Mesi, MD   Encounter Date: 06/13/2019  PT End of Session - 06/13/19 1225    Visit Number  2    Number of Visits  12    Date for PT Re-Evaluation  07/18/19    PT Start Time  1016    PT Stop Time  1057    PT Time Calculation (min)  41 min    Activity Tolerance  Patient tolerated treatment well    Behavior During Therapy  [facility] for tasks assessed/performed       History reviewed. No pertinent past medical history.  History reviewed. No pertinent surgical history.  There were no vitals filed for this visit.  Subjective Assessment - 06/13/19 1017    Subjective  went back to work Sunday; knee has been more painful.  tried to help mother move some items around house and had some increased pain when doing stairs.  exercises going well - doing them 2x/day.    Limitations  Walking    How long can you walk comfortably?  occasional pain    Diagnostic tests  MRI: negative    Patient Stated Goals  improve pain; "I just want my knee to go back to normal."    Currently in Pain?  Yes    Pain Score  6     Pain Location  Knee    Pain Orientation  Left;Medial;Anterior    Pain Descriptors / Indicators  Sharp    Pain Type  Acute pain    Pain Onset  More than a month ago    Pain Frequency  Intermittent    Aggravating Factors   stairs, walking    Pain Relieving Factors  ice, heat, rest, elevation, massage                       [facility] Adult PT Treatment/Exercise - 06/13/19 1020      Knee/Hip Exercises: Aerobic   Recumbent Bike  partial revolutions x 6 min      Knee/Hip Exercises: Seated   Long Arc Quad  Left;15 reps    Long Arc Quad Limitations  with ball squeeze       Knee/Hip Exercises: Supine   Short Arc [employer]  Left;15 reps   5 sec hold   Short Arc Quad Sets Limitations  improved range    Straight Leg Raise with External Rotation  Left;15 reps    Straight Leg Raise with External Rotation Limitations  limited range; quad lag present      Manual Therapy   Manual Therapy  Soft tissue mobilization    Soft tissue mobilization  IASTM with biofreeze to Lt lateral quad       Trigger Point Dry Needling - 06/13/19 1156    Consent Given?  Yes    Muscles Treated Lower Quadrant  Quadriceps;Vastus lateralis    Dry Needling Comments  tolerated 2 needle sticks only    Quadriceps Response  Twitch response elicited;Palpable increased muscle length    Vastus lateralis Response  Twitch response elicited;Palpable increased muscle length                PT Long Term Goals - 06/06/19 1214      PT LONG TERM GOAL #1   Title  independent with HEP    Status  New    Target Date  07/18/19      PT LONG TERM GOAL #2   Title  Lt knee AROM 0-120 for improved function and mobility    Status  New    Target Date  07/18/19      PT LONG TERM GOAL #3   Title  report pain < 4/10 with amb/standing at least 2 hours in order to return to work    Status  New    Target Date  07/18/19      PT LONG TERM GOAL #4   Title  demonstrate at least 4/5 Lt knee strength for improved function    Status  New    Target Date  07/18/19            Plan - 06/13/19 1225    Clinical Impression Statement  Pt continues to demonstrate difficulty with terminal knee extension and isolating quad activation.  Decreased sensitivity to light touch today, but still very tender.  DN to lateral quad produced positive twitch response and reported decreased pain following.  Will continue to benefit from PT to maximize function.    Examination-Activity Limitations  Locomotion Level;Squat;Stairs;Stand    Examination-Participation Restrictions  Other   occupation   Stability/Clinical Decision Making  Stable/Uncomplicated     Rehab Potential  Good    PT Frequency  2x / week   1x/wk due to financial concerns, may increase to 2x/wk if able   PT Duration  6 weeks    PT Treatment/Interventions  ADLs/Self Care Home Management;Cryotherapy;Electrical Stimulation;Ultrasound;Moist Heat;Iontophoresis 4mg /ml Dexamethasone;Gait training;Stair training;Functional mobility training;Therapeutic activities;Therapeutic exercise;Patient/family education;Manual techniques;Passive range of motion;Vasopneumatic Device;Taping;Dry needling    PT Next Visit Plan  consider taping to Lt knee; review HEP and work on ROM, manual/modalities PRN, DN to lateral quad    PT Home Exercise Plan  Access Code: [email or web address]    Consulted and Agree with Plan of Care  Patient       Patient will benefit from skilled therapeutic intervention in order to improve the following deficits and impairments:  Abnormal gait, Decreased range of motion, Difficulty walking, Increased fascial restricitons, Increased muscle spasms, Pain, Impaired flexibility, Decreased strength, Decreased mobility  Visit Diagnosis: Acute pain of left knee  Abnormality of gait  Stiffness of left knee     Problem List Patient Active Problem List   Diagnosis Date Noted  . 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 Breast mass in female 08/29/2013      Laureen Abrahams, PT, DPT 06/13/19 12:27 PM      [facility] Physical Therapy [address] [place], [place], [address] Phone: [phone]   Fax:  [phone]  Name: ROXINE WHITTINGHILL MRN: [id number] Date of Birth: February 09, 1991

## 2019-06-20 ENCOUNTER — Other Ambulatory Visit: Payer: Self-pay

## 2019-06-20 ENCOUNTER — Encounter: Payer: Self-pay | Admitting: Physical Therapy

## 2019-06-20 ENCOUNTER — Ambulatory Visit (INDEPENDENT_AMBULATORY_CARE_PROVIDER_SITE_OTHER): Payer: BC Managed Care – PPO | Admitting: Physical Therapy

## 2019-06-20 DIAGNOSIS — M25562 Pain in left knee: Secondary | ICD-10-CM

## 2019-06-20 DIAGNOSIS — R269 Unspecified abnormalities of gait and mobility: Secondary | ICD-10-CM

## 2019-06-20 DIAGNOSIS — M25662 Stiffness of left knee, not elsewhere classified: Secondary | ICD-10-CM

## 2019-06-20 NOTE — Therapy (Addendum)
[facility] Physical Therapy [address] [place], [place], [address] Phone: [phone]   Fax:  [phone]  Physical Therapy Treatment/Discharge Summary  Patient Details  Name: Sabrina Brennan MRN: [id number] Date of Birth: Sep 05, 1990 Referring Provider (PT): Eunice Blase, MD   Encounter Date: 06/20/2019  PT End of Session - 06/20/19 0840    Visit Number  3    Number of Visits  12    Date for PT Re-Evaluation  07/18/19    PT Start Time  0804    PT Stop Time  0842    PT Time Calculation (min)  38 min    Activity Tolerance  Patient tolerated treatment well    Behavior During Therapy  [facility] for tasks assessed/performed       History reviewed. No pertinent past medical history.  History reviewed. No pertinent surgical history.  There were no vitals filed for this visit.  Subjective Assessment - 06/20/19 0807    Subjective  feels better; thinks the DN was helpful.  worked a double on Sunday and did well, able to rest PRN during the shift.    Limitations  Walking    How long can you walk comfortably?  occasional pain    Diagnostic tests  MRI: negative    Patient Stated Goals  improve pain; "I just want my knee to go back to normal."    Currently in Pain?  No/denies    Pain Onset  More than a month ago                       [facility] Adult PT Treatment/Exercise - 06/20/19 0808      Knee/Hip Exercises: Stretches   Passive Hamstring Stretch  Left;3 reps;30 seconds    Quad Stretch  Left;3 reps;30 seconds      Knee/Hip Exercises: Aerobic   Recumbent Bike  L1 x 6 min      Knee/Hip Exercises: Prone   Straight Leg Raises  Right;10 reps    Other Prone Exercises  prone quad set x 10 reps      Manual Therapy   Manual Therapy  Soft tissue mobilization    Soft tissue mobilization  IASTM with biofreeze to Lt lateral quad       Trigger Point Dry Needling - 06/20/19 3536    Consent Given?  Yes    Muscles Treated Lower Quadrant   Quadriceps;Vastus medialis;Rectus femoris;Vastus intermedius    Quadriceps Response  Twitch response elicited;Palpable increased muscle length    Vastus medialis Response  Twitch response elicited;Palpable increased muscle length    Vastus intermedius Response  Twitch response elicited;Palpable increased muscle length                PT Long Term Goals - 06/06/19 1214      PT LONG TERM GOAL #1   Title  independent with HEP    Status  New    Target Date  07/18/19      PT LONG TERM GOAL #2   Title  Lt knee AROM 0-120 for improved function and mobility    Status  New    Target Date  07/18/19      PT LONG TERM GOAL #3   Title  report pain < 4/10 with amb/standing at least 2 hours in order to return to work    Status  New    Target Date  07/18/19      PT LONG TERM GOAL #4   Title  demonstrate at least 4/5 Lt knee strength for improved function    Status  New    Target Date  07/18/19            Plan - 06/20/19 0841    Clinical Impression Statement  Pt tolerated session well today with positive response to DN and manual therapy.  Will continue to benefit from PT to maximize function.    Examination-Activity Limitations  Locomotion Level;Squat;Stairs;Stand    Examination-Participation Restrictions  Other   occupation   Stability/Clinical Decision Making  Stable/Uncomplicated    Rehab Potential  Good    PT Frequency  2x / week   1x/wk due to financial concerns, may increase to 2x/wk if able   PT Duration  6 weeks    PT Treatment/Interventions  ADLs/Self Care Home Management;Cryotherapy;Electrical Stimulation;Ultrasound;Moist Heat;Iontophoresis 10m/ml Dexamethasone;Gait training;Stair training;Functional mobility training;Therapeutic activities;Therapeutic exercise;Patient/family education;Manual techniques;Passive range of motion;Vasopneumatic Device;Taping;Dry needling    PT Next Visit Plan  consider taping to Lt knee; review HEP and work on ROM, manual/modalities PRN,  assess response to DN    PT Home Exercise Plan  Access Code: [email or web address]    Consulted and Agree with Plan of Care  Patient       Patient will benefit from skilled therapeutic intervention in order to improve the following deficits and impairments:  Abnormal gait, Decreased range of motion, Difficulty walking, Increased fascial restricitons, Increased muscle spasms, Pain, Impaired flexibility, Decreased strength, Decreased mobility  Visit Diagnosis: Acute pain of left knee  Abnormality of gait  Stiffness of left knee     Problem List Patient Active Problem List   Diagnosis Date Noted  . 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 Breast mass in female 08/29/2013      SLaureen Abrahams PT, DPT 06/20/19 8:44 AM     [facility] [address] [address]: [phone]  Fax:  [phone] Name: Sabrina ANGELLMRN: [id number] of Birth: 1October 09, 1992    PHYSICAL THERAPY DISCHARGE SUMMARY  Visits from Start of Care: 3  Current functional level related to goals / functional outcomes: See above   Remaining deficits: Unknown, pt did not return   Education / Equipment: HEP  Plan: Patient agrees to discharge.  Patient goals were not met. Patient is being discharged due to not returning since the last visit.  ?????     SLaureen Abrahams PT, DPT 08/08/19 3:32 PM [facility] [address] [address]: [phone]  Fax:  [phone]

## 2019-07-04 ENCOUNTER — Encounter: Payer: BC Managed Care – PPO | Admitting: Physical Therapy

## 2019-10-08 ENCOUNTER — Ambulatory Visit (INDEPENDENT_AMBULATORY_CARE_PROVIDER_SITE_OTHER)
Admission: RE | Admit: 2019-10-08 | Discharge: 2019-10-08 | Disposition: A | Discharge status: Home / Self Care | Payer: Self-pay | Source: Ambulatory Visit

## 2019-10-08 DIAGNOSIS — J029 Acute pharyngitis, unspecified: Secondary | ICD-10-CM

## 2019-10-08 DIAGNOSIS — R0981 Nasal congestion: Secondary | ICD-10-CM

## 2019-10-08 DIAGNOSIS — R05 Cough: Secondary | ICD-10-CM

## 2019-10-08 MED ORDER — AMOXICILLIN-POT CLAVULANATE 875-125 MG PO TABS: 1.0000 | ORAL_TABLET | Freq: Two times a day (BID) | ORAL | 0 refills | Status: AC

## 2019-10-08 MED ORDER — LIDOCAINE VISCOUS HCL 2 % MT SOLN: 15.0000 mL | OROMUCOSAL | 0 refills | Status: DC | PRN

## 2019-10-08 NOTE — ED Provider Notes (Signed)
Virtual Visit via Video Note:  Sabrina Brennan  initiated request for Telemedicine visit with [facility] Urgent Care team. I connected with Sabrina Brennan  on 10/08/2019 at 12:24 PM  for a synchronized telemedicine visit using a video enabled HIPPA compliant telemedicine application. I verified that I am speaking with Sabrina Brennan  using two identifiers. Durward Parcel, FNP  was physically located in a [facility] Urgent care site and ANNICA MARINELLO was located at a different location.   The limitations of evaluation and management by telemedicine as well as the availability of in-person appointments were discussed. Patient was informed that she  may incur a bill ( including co-pay) for this virtual visit encounter. Sabrina Brennan  expressed understanding and gave verbal consent to proceed with virtual visit.     History of Present Illness:Sabrina Brennan  is a 29 y.o. female ho presents with abrupt onset of sore throat for for the past 4 days.  Denies sick exposure to strep, flu or mono, or precipitating event.  Has tried OTC Tylenol/ibuprofen without relief.  Symptoms are made worse with swallowing, but tolerating liquids and own secretions without difficulty.  Reports/ denies previous symptoms in the past.  Denies fever, chills, fatigue, ear pain, sinus pain, rhinorrhea, nasal congestion, cough, SOB, wheezing, chest pain, nausea, rash, changes in bowel or bladder habits.    No past medical history on file.  No Known Allergies      Observations/Objective:VITALS: Per patient if applicable, see vitals. GENERAL: Alert, appears well and in no acute distress. HEENT: Atraumatic, conjunctiva clear, no obvious abnormalities on inspection of external nose and ears. NECK: Normal movements of the head and neck. CARDIOPULMONARY: No increased WOB. Speaking in clear sentences. I:E ratio WNL.  MS: Moves all visible extremities without noticeable abnormality. PSYCH: Pleasant and cooperative,  well-groomed. Speech normal rate and rhythm. Affect is appropriate. Insight and judgement are appropriate. Attention is focused, linear, and appropriate.  NEURO: CN grossly intact. Oriented as arrived to appointment on time with no prompting. Moves both UE equally.  SKIN: No obvious lesions, wounds, erythema, or cyanosis noted on face or hands.     Assessment and Plan:  Acute pharyngitis, unspecified etiology COVID-19 test was negative and was completed on 10/05/2019 We will treat patient with Augmentin  Advised patient to return if symptoms does not resolve   Follow Up Instructions: Follow-up with primary care Return for worsening of symptoms   I discussed the assessment and treatment plan with the patient. The patient was provided an opportunity to ask questions and all were answered. The patient agreed with the plan and demonstrated an understanding of the instructions.   The patient was advised to call back or seek an in-person evaluation if the symptoms worsen or if the condition fails to improve as anticipated.  I provided 15 minutes of non-face-to-face time during this encounter.    Durward Parcel, FNP  10/08/2019 12:39 PM          Durward Parcel, FNP 10/08/19 1239

## 2019-10-08 NOTE — Discharge Instructions (Addendum)
Push fluids and get rest Prescribe Augmentin.  Take as directed and to completion.  Viscous lidocaine was prescribed Drink warm or cool liquids, use throat lozenges, or popsicles to help alleviate symptoms Take OTC ibuprofen or tylenol as needed for pain Follow up with PCP if symptoms persist Return or go to ER if you have any new or worsening symptoms such as fever, chills, nausea, vomiting, worsening sore throat, cough, abdominal pain, chest pain, changes in bowel or bladder habits, etc..Marland Kitchen

## 2019-10-17 ENCOUNTER — Encounter (HOSPITAL_COMMUNITY): Payer: Self-pay | Admitting: Emergency Medicine

## 2019-10-17 ENCOUNTER — Ambulatory Visit (INDEPENDENT_AMBULATORY_CARE_PROVIDER_SITE_OTHER)
Admission: RE | Admit: 2019-10-17 | Discharge: 2019-10-17 | Disposition: A | Discharge status: Other Acute Inpatient Hospital | Payer: Medicaid Other | Source: Ambulatory Visit

## 2019-10-17 ENCOUNTER — Ambulatory Visit (HOSPITAL_COMMUNITY)
Admission: EM | Admit: 2019-10-17 | Discharge: 2019-10-17 | Disposition: A | Discharge status: Home / Self Care | Payer: Medicaid Other | Attending: Family Medicine | Admitting: Family Medicine

## 2019-10-17 ENCOUNTER — Other Ambulatory Visit: Payer: Self-pay

## 2019-10-17 DIAGNOSIS — L0291 Cutaneous abscess, unspecified: Secondary | ICD-10-CM

## 2019-10-17 DIAGNOSIS — R229 Localized swelling, mass and lump, unspecified: Secondary | ICD-10-CM

## 2019-10-17 NOTE — Discharge Instructions (Addendum)
Come to the Urgent Care for evaluation of your "boil".

## 2019-10-17 NOTE — ED Provider Notes (Addendum)
Patient states she has a "boil" on her left upper inner thigh x 2 months.  She is currently on Augmentin for pharyngitis since 10/08/2019.  Discussed that her "boil" will likely need to be lanced.  She will come to the Urgent Care for in-person evaluation.       Mickie Bail, NP 10/17/19 1731    Mickie Bail, NP 10/17/19 1734

## 2019-10-17 NOTE — ED Triage Notes (Signed)
Abscess to left groin that has come and gone for 2 months. Had a virtual visit at 5:30 pm.

## 2019-10-18 ENCOUNTER — Encounter (HOSPITAL_BASED_OUTPATIENT_CLINIC_OR_DEPARTMENT_OTHER): Payer: Self-pay | Admitting: Emergency Medicine

## 2019-10-18 ENCOUNTER — Other Ambulatory Visit: Payer: Self-pay

## 2019-10-18 ENCOUNTER — Emergency Department (HOSPITAL_BASED_OUTPATIENT_CLINIC_OR_DEPARTMENT_OTHER)
Admission: EM | Admit: 2019-10-18 | Discharge: 2019-10-18 | Disposition: A | Discharge status: Home / Self Care | Payer: Medicaid Other | Attending: Emergency Medicine | Admitting: Emergency Medicine

## 2019-10-18 DIAGNOSIS — R2242 Localized swelling, mass and lump, left lower limb: Secondary | ICD-10-CM

## 2019-10-18 NOTE — ED Notes (Signed)
Per chart review - pt on Augmentin for pharyngitis.

## 2019-10-18 NOTE — Discharge Instructions (Signed)
As we discussed today there is no significant evidence that the lump currently requires incision.  There is possibility that these may be an ingrown hair.  Please use warm compresses and continue to monitor it over the next few days.  If it gets significantly worse, or you have concerns please seek additional medical care and evaluation.  It may open up and drain slightly which is ok.    Keep your appointment with surgery.   Please take Ibuprofen (Advil, motrin) and Tylenol (acetaminophen) to relieve your pain.  You may take up to 600 MG (3 pills) of normal strength ibuprofen every 8 hours as needed.  In between doses of ibuprofen you make take tylenol, up to 1,000 mg (two extra strength pills).  Do not take more than 3,000 mg tylenol in a 24 hour period.  Please check all medication labels as many medications such as pain and cold medications may contain tylenol.  Do not drink alcohol while taking these medications.  Do not take other NSAID'S while taking ibuprofen (such as aleve or naproxen).  Please take ibuprofen with food to decrease stomach upset.

## 2019-10-18 NOTE — ED Provider Notes (Signed)
  [facility]   [id number] 10/17/19 Arrival Time: 1856  ASSESSMENT & PLAN:  1. Localized skin mass, lump, or swelling     Recommend surgical evaluation for removal. No signs of infection. Discussed.  Follow-up Information    Schedule an appointment as soon as possible for a visit  with [facility].   Contact information: [address] [place] [address]          After Visit Summary given.   SUBJECTIVE:  Sabrina Brennan is a 29 y.o. female who presents with a skin complaint. Inner upper L thigh; present approx 2 months. Occasionally gets red/irritated. No specific pain now. No bleeding or drainage. Afebrile. No OTC tx.    OBJECTIVE: Vitals:   10/17/19 1942  BP: 117/81  Pulse: 93  Resp: 16  Temp: 98.6 F (37 C)  TempSrc: Oral  SpO2: 100%    General appearance: alert; no distress HEENT: [place]; AT Neck: supple with FROM Lungs: clear to auscultation bilaterally Heart: regular rate and rhythm Extremities: no edema; moves all extremities normally Skin: warm and dry; approx 0.5 cm fleshy soft skin growth on upper inner L thigh; no fluctuance or surrounding skin thickening; no skin erythema; normal skin temp; no specific TTP; no bleeding Psychological: alert and cooperative; normal mood and affect  No Known Allergies  History reviewed. No pertinent past medical history.   Social History   Socioeconomic History  . Marital status: Single    Spouse name: Not on file  . Number of children: Not on file  . Years of education: Not on file  . Highest education level: Not on file  Occupational History  . Not on file  Tobacco Use  . Smoking status: Never Smoker  . Smokeless tobacco: Never Used  Substance and Sexual Activity  . Alcohol use: Yes    Alcohol/week: 2.0 standard drinks    Types: 2 Standard drinks or equivalent per week  . Drug use: No  . Sexual activity: Yes    Birth control/protection:  Injection  Other Topics Concern  . Not on file  Social History Narrative  . Not on file   Social Determinants of Health   Financial Resource Strain:   . Difficulty of Paying Living Expenses:   Food Insecurity:   . Worried About Programme researcher, broadcasting/film/video in the Last Year:   . Barista in the Last Year:   Transportation Needs:   . Freight forwarder (Medical):   Marland Kitchen Lack of Transportation (Non-Medical):   Physical Activity:   . Days of Exercise per Week:   . Minutes of Exercise per Session:   Stress:   . Feeling of Stress :   Social Connections:   . Frequency of Communication with Friends and Family:   . Frequency of Social Gatherings with Friends and Family:   . Attends Religious Services:   . Active Member of Clubs or Organizations:   . Attends Banker Meetings:   Marland Kitchen Marital Status:   Intimate Partner Violence:   . Fear of Current or Ex-Partner:   . Emotionally Abused:   Marland Kitchen Physically Abused:   . Sexually Abused:    No family history on file. History reviewed. No pertinent surgical history.   Mardella Layman, MD 10/18/19 [phone]

## 2019-10-18 NOTE — ED Provider Notes (Signed)
[facility]ENC[facility] DEPARTMENT Provider Note   CSN: [id number] Arrival date & time: 10/18/19  1937     History Chief Complaint  Patient presents with  . Abscess    Sabrina Brennan is a 29 y.o. female with no pertinent past medical history who presents today for evaluation of a lump on her left upper thigh.  She reports that over the past 3 months she has had an area that has been slowly growing in size.  She reports that she went to urgent care yesterday and they recommended that she follow-up with general surgery.  She has an appointment however states it is not until April.  She is currently taking Augmentin for pharyngitis.  She reports that her tetanus is up-to-date.  She denies any fevers, myalgias/arthralgias or system wide symptoms.   HPI     History reviewed. No pertinent past medical history.  Patient Active Problem List   Diagnosis Date Noted  . 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 Breast mass in female 08/29/2013    History reviewed. No pertinent surgical history.   OB History    Gravida  1   Para  0   Term      Preterm      AB  1   Living        SAB      TAB  1   Ectopic      Multiple      Live Births           Obstetric Comments  1st Menstrual Cycle:  14        History reviewed. No pertinent family history.  Social History   Tobacco Use  . Smoking status: Never Smoker  . Smokeless tobacco: Never Used  Substance Use Topics  . Alcohol use: Yes    Alcohol/week: 2.0 standard drinks    Types: 2 Standard drinks or equivalent per week  . Drug use: No    Home Medications Prior to Admission medications   Medication Sig Start Date End Date Taking? Authorizing Provider  amoxicillin-clavulanate (AUGMENTIN) 875-125 MG tablet Take 1 tablet by mouth every 12 (twelve) hours for 10 days. 10/08/19 10/18/19  Avegno, Zachery Dakins, FNP  aspirin-acetaminophen-caffeine (EXCEDRIN  MIGRAINE) [phone] MG tablet Take 2 tablets by mouth every 6 (six) hours as needed for headache.    [provider]  lidocaine (XYLOCAINE) 2 % solution Use as directed 15 mLs in the mouth or throat as needed for mouth pain. 10/08/19   Avegno, Zachery Dakins, FNP  nabumetone (RELAFEN) 750 MG tablet Take 1 tablet (750 mg total) by mouth 2 (two) times daily as needed. Patient not taking: Reported on 06/06/2019 05/18/19   Hilts, Casimiro Needle, MD    Allergies    Patient has no known allergies.  Review of Systems   Review of Systems  Constitutional: Negative for chills and fever.  Musculoskeletal: Negative for arthralgias and myalgias.  Skin: Positive for wound.  Neurological: Negative for weakness.    Physical Exam Updated Vital Signs BP 119/78   Pulse 84   Temp 98.7 F (37.1 C) (Oral)   Resp 16   Ht 4\' 11"  (1.499 m)   Wt 74.4 kg   SpO2 98%   BMI 33.12 kg/m   Physical Exam Vitals and nursing note reviewed.  Constitutional:      General: She is not in acute distress.    Appearance: She is not ill-appearing.  HENT:     Head: Normocephalic.  Cardiovascular:     Rate and Rhythm: Normal rate.  Pulmonary:     Effort: Pulmonary effort is normal. No respiratory distress.  Skin:    Comments: Please see clinical image.  There is a subcentimeter area of circular swelling on the left proximal medial thigh in the hairbearing area.  There is minimal surrounding erythema without significant induration.  The area itself is not tender, only tender to palpation directly over the lump.  Neurological:     Mental Status: She is alert.    Picture orientation note: Black triangle is underwear, right thigh is on the upper part of the picture with the left thigh extending off into the lower part of the picture.   ED Results / Procedures / Treatments   Labs (all labs ordered are listed, but only abnormal results are displayed) Labs Reviewed - No data to display  EKG None  Radiology No  results found.  Procedures Ultrasound ED Soft Tissue  Date/Time: 10/18/2019 8:35 PM Performed by: Cristina Gong, PA-C Authorized by: Cristina Gong, PA-C   Procedure details:    Indications: localization of abscess and evaluate for cellulitis     Transverse view:  Visualized   Longitudinal view:  Visualized   Images: not archived (Attempted archive, however unable to do so due to technical difficulty.  Picture of ultrasound screen placed in chart.)   Location:    Location: lower extremity     Side:  Left Comments:     There is no significant fluid collection/abscess that would benefit from draining.  No surrounding cellulitis.       (including critical care time)  Medications Ordered in ED Medications - No data to display  ED Course  I have reviewed the triage vital signs and the nursing notes.  Pertinent labs & imaging results that were available during my care of the patient were reviewed by me and considered in my medical decision making (see chart for details).    MDM Rules/Calculators/A&P                     Patient is a 29 year old woman who presents today for evaluation of approximately 3 months of a centimeter area of swelling on her left thigh that is gradually worsening. On exam she has very localized tenderness without surrounding erythema or induration.  Ultrasound was used without a significant drainable fluid collection.  She denies any possibility of foreign body in the area.  Recommended conservative care including warm compresses, padding around the swollen area, and symptomatic care with OTC pain medicine and for patient to continue to monitor the area.  She is currently taking Augmentin for pharyngitis.  At this time I suspect this is more of an inflammatory reaction, possibly to an ingrown hair, rather than an infectious process.  She already has a appointment with general surgery.  We discussed that, while at this time I&D is not indicated,  this may develop into a abscess that would require intervention in the future and she states her understanding.  Return precautions were discussed with patient who states their understanding.  At the time of discharge patient denied any unaddressed complaints or concerns.  Patient is agreeable for discharge home.  Note: Portions of this report may have been transcribed using voice recognition software. Every effort was made to ensure accuracy; however, inadvertent computerized transcription errors may be present  Final Clinical Impression(s) / ED Diagnoses Final diagnoses:  Lump of skin of left lower extremity    Rx / DC Orders ED Discharge Orders    None       Ollen Gross 10/18/19 2038    Davonna Belling, MD 10/18/19 [phone]

## 2019-10-18 NOTE — ED Triage Notes (Signed)
Pt reports boil to groin since January. States she's been on amoxicillin with no relief. Seen by UC yesterday and sent to surgeon. States pain is worse today.

## 2019-10-31 ENCOUNTER — Inpatient Hospital Stay
Admission: RE | Admit: 2019-10-31 | Discharge: 2019-10-31 | Disposition: A | Discharge status: Home / Self Care | Payer: Medicaid Other | Source: Ambulatory Visit

## 2019-10-31 ENCOUNTER — Ambulatory Visit (INDEPENDENT_AMBULATORY_CARE_PROVIDER_SITE_OTHER)
Admission: RE | Admit: 2019-10-31 | Discharge: 2019-10-31 | Disposition: A | Discharge status: Home / Self Care | Payer: Medicaid Other | Source: Ambulatory Visit

## 2019-10-31 DIAGNOSIS — R05 Cough: Secondary | ICD-10-CM

## 2019-10-31 DIAGNOSIS — R0981 Nasal congestion: Secondary | ICD-10-CM

## 2019-10-31 DIAGNOSIS — B349 Viral infection, unspecified: Secondary | ICD-10-CM

## 2019-10-31 DIAGNOSIS — Z77120 Contact with and (suspected) exposure to mold (toxic): Secondary | ICD-10-CM

## 2019-10-31 MED ORDER — CETIRIZINE HCL 10 MG PO TABS: 10.0000 mg | ORAL_TABLET | Freq: Every day | ORAL | 0 refills | Status: DC

## 2019-10-31 MED ORDER — FLUTICASONE PROPIONATE 50 MCG/ACT NA SUSP: 1.0000 | Freq: Every day | NASAL | 2 refills | Status: DC

## 2019-10-31 NOTE — Discharge Instructions (Signed)
Take the medications for symptoms.  Follow up as needed for continued or worsening symptoms

## 2019-10-31 NOTE — ED Provider Notes (Signed)
Virtual Visit via Video Note:  Sabrina Brennan  initiated request for Telemedicine visit with [facility] team. I connected with Sabrina Brennan  on 10/31/2019 at 1:01 PM  for a synchronized telemedicine visit using a video enabled HIPPA compliant telemedicine application. I verified that I am speaking with Sabrina Brennan  using two identifiers. Janace Aris, NP  was physically located in a [facility] Urgent care site and Sabrina Brennan was located at a different location.   The limitations of evaluation and management by telemedicine as well as the availability of in-person appointments were discussed. Patient was informed that she  may incur a bill ( including co-pay) for this virtual visit encounter. Sabrina Brennan  expressed understanding and gave verbal consent to proceed with virtual visit.     History of Present Illness:Sabrina Brennan  is a 29 y.o. female presents with nasal congestion, ear pressure, mild cough.  Symptoms have been constant since yesterday.  She has not take anything for her symptoms.  She admits to being exposed to mold at work.  Concerned about allergies.  Has not taking any allergy medicine.  No fever, chills, body aches, loss of taste or smell, sore throat.  No past medical history on file.  No Known Allergies      Observations/Objective: VITALS: Per patient if applicable, see vitals. GENERAL: Alert, appears well and in no acute distress. HEENT: Atraumatic, conjunctiva clear, no obvious abnormalities on inspection of external nose and ears. Sounds congested NECK: Normal movements of the head and neck. CARDIOPULMONARY: No increased WOB. Speaking in clear sentences. I:E ratio WNL.  MS: Moves all visible extremities without noticeable abnormality. PSYCH: Pleasant and cooperative, well-groomed. Speech normal rate and rhythm. Affect is appropriate. Insight and judgement are appropriate. Attention is focused, linear, and appropriate.  NEURO: CN grossly  intact. Oriented as arrived to appointment on time with no prompting. Moves both UE equally.  SKIN: No obvious lesions, wounds, erythema, or cyanosis noted on face or hands.     Assessment and Plan: Allergies versus viral URI. Treating with Zyrtec and Flonase.     Follow Up Instructions: Follow up as needed for continued or worsening symptoms     I discussed the assessment and treatment plan with the patient. The patient was provided an opportunity to ask questions and all were answered. The patient agreed with the plan and demonstrated an understanding of the instructions.   The patient was advised to call back or seek an in-person evaluation if the symptoms worsen or if the condition fails to improve as anticipated.    Janace Aris, NP  10/31/2019 1:01 PM          Janace Aris, NP 10/31/19 1303

## 2019-11-02 ENCOUNTER — Ambulatory Visit: Payer: Medicaid Other | Attending: Internal Medicine

## 2019-11-02 DIAGNOSIS — Z23 Encounter for immunization: Secondary | ICD-10-CM

## 2019-11-02 NOTE — Progress Notes (Signed)
   Covid-19 Vaccination Clinic  Name:  LARIA GRIMMETT    MRN: [id number] DOB: 05-09-1991  11/02/2019  Ms. Bousquet was observed post Covid-19 immunization for 15 minutes without incident. She was provided with Vaccine Information Sheet and instruction to access the V-Safe system.   Ms. Arrowood was instructed to call 911 with any severe reactions post vaccine: Marland Kitchen Difficulty breathing  . Swelling of face and throat  . A fast heartbeat  . A bad rash all over body  . Dizziness and weakness   Immunizations Administered    Name Date Dose VIS Date Route   Pfizer COVID-19 Vaccine 11/02/2019  8:28 AM 0.3 mL 07/13/2019 Intramuscular   Manufacturer: [employer], [employer]   Lot: [id number]   NDC: [address]-9

## 2019-11-05 ENCOUNTER — Ambulatory Visit: Payer: Self-pay | Admitting: Surgery

## 2019-11-05 NOTE — H&P (Signed)
Sabrina Brennan Documented: 11/05/2019 9:36 AM Location: [facility] Patient #: [id number] DOB: Mar 13, 1991 Single / Language: Sabrina Brennan / Race: Black or African American Female  History of Present Illness Sabrina Brennan A. [name] MD; 11/05/2019 12:16 PM) Patient words: Patient presents for evaluation of the left groin cyst. The present since December. Cyst locations on the inner upper aspect of her left thigh. He rubs against her clothing and drains. It is called mild to moderate discomfort. There is been recent drainage or redness.  The patient is a 29 year old female.   Past Surgical History Sabrina Brennan, [place]; 11/05/2019 9:37 AM) No pertinent past surgical history  Diagnostic Studies History Sabrina Brennan, [name]; 11/05/2019 9:37 AM) Colonoscopy never Mammogram never Pap Smear 1-5 years ago  Allergies Sabrina Brennan, [name]; 11/05/2019 9:37 AM) No Known Drug Allergies [11/05/2019]: Allergies Reconciled  Medication History Sabrina Brennan, [name]; 11/05/2019 9:38 AM) ZyrTEC (10MG  Tablet, Oral) Active. Medications Reconciled  Social History Sabrina Brennan, [name]; 11/05/2019 9:37 AM) Alcohol use Occasional alcohol use. Caffeine use Coffee. No drug use Tobacco use Never smoker.  Family History Sabrina Brennan, [name]; 11/05/2019 9:37 AM) Alcohol Abuse Father.  Pregnancy / Birth History Sabrina Brennan, [name]; 11/05/2019 9:37 AM) Contraceptive History Depo-provera. Gravida 0 Para 0 Regular periods  Other Problems Sabrina Brennan, [name]; 11/05/2019 9:37 AM) No pertinent past medical history     Review of Systems Sabrina Brennan [name]; 11/05/2019 9:37 AM) General Not Present- Appetite Loss, Chills, Fatigue, Fever, Night Sweats, Weight Gain and Weight Loss. Skin Not Present- Change in Wart/Mole, Dryness, Hives, Jaundice, New Lesions, Non-Healing Wounds, Rash and Ulcer. HEENT Not Present- Earache, Hearing Loss, Hoarseness, Nose Bleed, Oral Ulcers, Ringing in the Ears,  Seasonal Allergies, Sinus Pain, Sore Throat, Visual Disturbances, Wears glasses/contact lenses and Yellow Eyes. Breast Not Present- Breast Mass, Breast Pain, Nipple Discharge and Skin Changes. Cardiovascular Not Present- Chest Pain, Difficulty Breathing Lying Down, Leg Cramps, Palpitations, Rapid Heart Rate, Shortness of Breath and Swelling of Extremities. Gastrointestinal Not Present- Abdominal Pain, Bloating, Bloody Stool, Change in Bowel Habits, Chronic diarrhea, Constipation, Difficulty Swallowing, Excessive gas, Gets full quickly at meals, Hemorrhoids, Indigestion, Nausea, Rectal Pain and Vomiting. Female Genitourinary Not Present- Frequency, Nocturia, Painful Urination, Pelvic Pain and Urgency. Neurological Not Present- Decreased Memory, Fainting, Headaches, Numbness, Seizures, Tingling, Tremor, Trouble walking and Weakness. Psychiatric Not Present- Anxiety, Bipolar, Change in Sleep Pattern, Depression, Fearful and Frequent crying. Endocrine Not Present- Cold Intolerance, Excessive Hunger, Hair Changes, Heat Intolerance, Hot flashes and New Diabetes. Hematology Not Present- Blood Thinners, Easy Bruising, Excessive bleeding, Gland problems, HIV and Persistent Infections.  Vitals Sabrina Brennan [name]; 11/05/2019 9:37 AM) 11/05/2019 9:37 AM Weight: 164.2 lb Height: 60in Body Surface Area: 1.72 m Body Mass Index: 32.07 kg/m  Temp.: 98.36F  Pulse: 95 (Regular)  BP: 128/74 (Sitting, Left Arm, Standard)        Physical Exam (Sukhman Kocher A. Maddox Bratcher MD; 11/05/2019 12:17 PM)  Integumentary Note: 3 cm epidermal inclusion cyst medial left thigh. No redness or drainage. No fluctuance.  Head and Neck Head-normocephalic, atraumatic with no lesions or palpable masses.  Chest and Lung Exam Chest and lung exam reveals -quiet, even and easy respiratory effort with no use of accessory muscles and on auscultation, normal breath sounds, no adventitious sounds and normal vocal  resonance. Inspection Chest Wall - Normal. Back - normal.  Cardiovascular Cardiovascular examination reveals -on palpation PMI is normal in location and amplitude, no palpable S3 or S4. Normal cardiac borders., normal heart sounds, regular rate and rhythm  with no murmurs, carotid auscultation reveals no bruits and normal pedal pulses bilaterally.  Neurologic Neurologic evaluation reveals -alert and oriented x 3 with no impairment of recent or remote memory. Mental Status-Normal.  Musculoskeletal Normal Exam - Left-Upper Extremity Strength Normal and Lower Extremity Strength Normal. Normal Exam - Right-Upper Extremity Strength Normal and Lower Extremity Strength Normal.    Assessment & Plan ([name] A. [name] MD; 11/05/2019 12:18 PM)  SEBACEOUS CYST (L72.3) Impression: 3 cm left inner thigh symptomatic epidermal inclusion cyst Patient desires excision. Risks and benefits of surgery discussed as well as complications of bleeding, infection, recurrence, and any further treatment and/or surgery. Postoperative care and potential complications discussed with the patient long-term expectations. Total time 30 minutes for augmentation, examination, reviewing medical records, is causing surgery and complications, and nonoperative discussion with documentation  Current Plans The pathophysiology of skin & subcutaneous masses was discussed. Natural history risks without surgery were discussed. I recommended surgery to remove the mass. I explained the technique of removal with use of local anesthesia & possible need for more aggressive sedation/anesthesia for patient comfort.  Risks such as bleeding, infection, wound breakdown, heart attack, death, and other risks were discussed. I noted a good likelihood this will help address the problem. Possibility that this will not correct all symptoms was explained. Possibility of regrowth/recurrence of the mass was discussed. We will work to  minimize complications. Questions were answered. The patient expresses understanding & wishes to proceed with surgery.  Pt Education - [facility] Free Text Education/Instructions: discussed with patient and provided information. You are being scheduled for surgery- Our schedulers will call you.  You should hear from our office's scheduling department within 5 working days about the location, date, and time of surgery. We try to make accommodations for patient's preferences in scheduling surgery, but sometimes the OR schedule or the surgeon's schedule prevents [place] from making those accommodations.  If you have not heard from our office [phone]) in 5 working days, call the office and ask for your surgeon's nurse.  If you have other questions about your diagnosis, plan, or surgery, call the office and ask for your surgeon's nurse.

## 2019-11-26 ENCOUNTER — Ambulatory Visit: Payer: Medicaid Other | Attending: Internal Medicine

## 2019-11-26 DIAGNOSIS — Z23 Encounter for immunization: Secondary | ICD-10-CM

## 2019-11-26 NOTE — Progress Notes (Signed)
   Covid-19 Vaccination Clinic  Name:  LAQUISHA NORTHCRAFT    MRN: [id number] DOB: 09-27-1990  11/26/2019  Ms. Rozycki was observed post Covid-19 immunization for 15 minutes without incident. She was provided with Vaccine Information Sheet and instruction to access the V-Safe system.   Ms. Mcwatters was instructed to call 911 with any severe reactions post vaccine: Marland Kitchen Difficulty breathing  . Swelling of face and throat  . A fast heartbeat  . A bad rash all over body  . Dizziness and weakness   Immunizations Administered    Name Date Dose VIS Date Route   Pfizer COVID-19 Vaccine 11/26/2019  9:05 AM 0.3 mL 09/26/2018 Intramuscular   Manufacturer: [employer], [employer]   Lot: [id number]   NDC: [address]-5

## 2019-11-27 ENCOUNTER — Other Ambulatory Visit: Payer: Self-pay

## 2019-11-27 ENCOUNTER — Ambulatory Visit (INDEPENDENT_AMBULATORY_CARE_PROVIDER_SITE_OTHER)
Admission: RE | Admit: 2019-11-27 | Discharge: 2019-11-27 | Disposition: A | Discharge status: Home / Self Care | Payer: Self-pay | Source: Ambulatory Visit

## 2019-11-27 ENCOUNTER — Ambulatory Visit: Payer: Medicaid Other

## 2019-11-27 DIAGNOSIS — R112 Nausea with vomiting, unspecified: Secondary | ICD-10-CM

## 2019-11-27 MED ORDER — ONDANSETRON HCL 4 MG PO TABS: 4.0000 mg | ORAL_TABLET | Freq: Four times a day (QID) | ORAL | 0 refills | Status: DC | PRN

## 2019-11-27 NOTE — Discharge Instructions (Addendum)
Take the anti-nausea medication as needed for nausea or vomiting.    Follow up with your primary care provider or come here to be seen in person if your symptoms are not improving.

## 2019-11-27 NOTE — ED Provider Notes (Signed)
Virtual Visit via Video Note:  Sabrina Brennan  initiated request for Telemedicine visit with [facility] Urgent Care team. I connected with Sabrina Brennan  on 11/27/2019 at 11:05 AM  for a synchronized telemedicine visit using a video enabled HIPPA compliant telemedicine application. I verified that I am speaking with Sabrina Brennan  using two identifiers. Sabrina Bail, NP  was physically located in a [facility] Urgent care site and Sabrina Brennan was located at a different location.   The limitations of evaluation and management by telemedicine as well as the availability of in-person appointments were discussed. Patient was informed that she  may incur a bill ( including co-pay) for this virtual visit encounter. Sabrina Brennan  expressed understanding and gave verbal consent to proceed with virtual visit.     History of Present Illness:Sabrina Brennan  is a 29 y.o. female presents for evaluation of body aches, chills, nausea, emesis x 1, headache.  Her symptoms started overnight.   She received her second COVID vaccine yesterday.   She has taken ibuprofen and Tylenol for her symptoms.  She denies fever, cough, shortness of breath, diarrhea, rash, or other symptoms.  She denies current pregnancy or breastfeeding.     No Known Allergies   History reviewed. No pertinent past medical history.   Social History   Tobacco Use  . Smoking status: Never Smoker  . Smokeless tobacco: Never Used  Substance Use Topics  . Alcohol use: Yes    Alcohol/week: 2.0 standard drinks    Types: 2 Standard drinks or equivalent per week  . Drug use: No   ROS: as stated in HPI.  All other systems reviewed and negative.      Observations/Objective: Physical Exam  VITALS: Patient denies fever. GENERAL: Alert, appears well and in no acute distress. HEENT: Atraumatic. NECK: Normal movements of the head and neck. CARDIOPULMONARY: No increased WOB. Speaking in clear sentences. I:E ratio WNL.  MS: Moves all  visible extremities without noticeable abnormality. PSYCH: Pleasant and cooperative, well-groomed. Speech normal rate and rhythm. Affect is appropriate. Insight and judgement are appropriate. Attention is focused, linear, and appropriate.  NEURO: CN grossly intact. Oriented as arrived to appointment on time with no prompting. Moves both UE equally.  SKIN: No obvious lesions, wounds, erythema, or cyanosis noted on face or hands.   Assessment and Plan:    ICD-10-CM   1. Non-intractable vomiting with nausea, unspecified vomiting type  R11.2        Follow Up Instructions: Treating n/v with Zofran.  Instructed patient to rest and stay hydrated.  Tylenol or ibuprofen as needed for body aches.  Instructed her to follow-up with her PCP or come here to be seen in person if her symptoms or not improving.  Patient agrees with plan of care.      I discussed the assessment and treatment plan with the patient. The patient was provided an opportunity to ask questions and all were answered. The patient agreed with the plan and demonstrated an understanding of the instructions.   The patient was advised to call back or seek an in-person evaluation if the symptoms worsen or if the condition fails to improve as anticipated.      Sabrina Bail, NP  11/27/2019 11:05 AM         Sabrina Bail, NP 11/27/19 1105

## 2019-12-03 ENCOUNTER — Encounter (HOSPITAL_BASED_OUTPATIENT_CLINIC_OR_DEPARTMENT_OTHER): Payer: Self-pay | Admitting: Surgery

## 2019-12-03 ENCOUNTER — Other Ambulatory Visit: Payer: Self-pay

## 2019-12-07 ENCOUNTER — Other Ambulatory Visit (HOSPITAL_COMMUNITY)
Admission: RE | Admit: 2019-12-07 | Discharge: 2019-12-07 | Disposition: A | Discharge status: Home / Self Care | Payer: Medicaid Other | Source: Ambulatory Visit | Attending: Surgery | Admitting: Surgery

## 2019-12-07 DIAGNOSIS — Z20822 Contact with and (suspected) exposure to covid-19: Secondary | ICD-10-CM | POA: Diagnosis not present

## 2019-12-07 DIAGNOSIS — Z01812 Encounter for preprocedural laboratory examination: Secondary | ICD-10-CM | POA: Insufficient documentation

## 2019-12-07 LAB — SARS CORONAVIRUS 2 (TAT 6-24 HRS): SARS Coronavirus 2: NEGATIVE

## 2019-12-07 NOTE — Progress Notes (Signed)

## 2019-12-11 ENCOUNTER — Encounter (HOSPITAL_BASED_OUTPATIENT_CLINIC_OR_DEPARTMENT_OTHER): Payer: Self-pay | Admitting: Surgery

## 2019-12-11 ENCOUNTER — Ambulatory Visit (HOSPITAL_BASED_OUTPATIENT_CLINIC_OR_DEPARTMENT_OTHER)
Admission: RE | Admit: 2019-12-11 | Discharge: 2019-12-11 | Disposition: A | Discharge status: Home / Self Care | Payer: BC Managed Care – PPO | Attending: Surgery | Admitting: Surgery

## 2019-12-11 ENCOUNTER — Ambulatory Visit (HOSPITAL_BASED_OUTPATIENT_CLINIC_OR_DEPARTMENT_OTHER): Payer: BC Managed Care – PPO | Admitting: Anesthesiology

## 2019-12-11 ENCOUNTER — Other Ambulatory Visit: Payer: Self-pay

## 2019-12-11 ENCOUNTER — Encounter (HOSPITAL_BASED_OUTPATIENT_CLINIC_OR_DEPARTMENT_OTHER)
Admission: RE | Disposition: A | Discharge status: Home / Self Care | Payer: Self-pay | Source: Home / Self Care | Attending: Surgery

## 2019-12-11 DIAGNOSIS — L720 Epidermal cyst: Secondary | ICD-10-CM | POA: Insufficient documentation

## 2019-12-11 DIAGNOSIS — L723 Sebaceous cyst: Secondary | ICD-10-CM | POA: Diagnosis present

## 2019-12-11 HISTORY — DX: Other specified health status: Z78.9

## 2019-12-11 HISTORY — PX: EXCISION MASS LOWER EXTREMETIES: SHX6705

## 2019-12-11 LAB — POCT PREGNANCY, URINE: Preg Test, Ur: NEGATIVE

## 2019-12-11 SURGERY — EXCISION MASS LOWER EXTREMITIES
Anesthesia: General | Site: Thigh | Laterality: Left

## 2019-12-11 MED ORDER — FENTANYL CITRATE (PF) 100 MCG/2ML IJ SOLN
INTRAMUSCULAR | Status: DC | PRN
  Administered 2019-12-11: 50 ug via INTRAVENOUS

## 2019-12-11 MED ORDER — PROPOFOL 10 MG/ML IV BOLUS
INTRAVENOUS | Status: DC | PRN
  Administered 2019-12-11: 200 mg via INTRAVENOUS

## 2019-12-11 MED ORDER — ACETAMINOPHEN 500 MG PO TABS: 1000.0000 mg | ORAL_TABLET | ORAL | Status: DC

## 2019-12-11 MED ORDER — DEXAMETHASONE SODIUM PHOSPHATE 10 MG/ML IJ SOLN
INTRAMUSCULAR | Status: AC
  Filled 2019-12-11: qty 1

## 2019-12-11 MED ORDER — FENTANYL CITRATE (PF) 100 MCG/2ML IJ SOLN
25.0000 ug | INTRAMUSCULAR | Status: DC | PRN
  Administered 2019-12-11: 50 ug via INTRAVENOUS

## 2019-12-11 MED ORDER — DEXAMETHASONE SODIUM PHOSPHATE 4 MG/ML IJ SOLN
INTRAMUSCULAR | Status: DC | PRN
  Administered 2019-12-11: 4 mg via INTRAVENOUS

## 2019-12-11 MED ORDER — ACETAMINOPHEN 500 MG PO TABS
1000.0000 mg | ORAL_TABLET | Freq: Once | ORAL | Status: AC
  Administered 2019-12-11: 13:00:00 1000 mg via ORAL

## 2019-12-11 MED ORDER — ONDANSETRON HCL 4 MG/2ML IJ SOLN
INTRAMUSCULAR | Status: DC | PRN
  Administered 2019-12-11: 4 mg via INTRAVENOUS

## 2019-12-11 MED ORDER — LACTATED RINGERS IV SOLN: INTRAVENOUS | Status: DC

## 2019-12-11 MED ORDER — CELECOXIB 200 MG PO CAPS
200.0000 mg | ORAL_CAPSULE | Freq: Once | ORAL | Status: AC
  Administered 2019-12-11: 200 mg via ORAL

## 2019-12-11 MED ORDER — PROMETHAZINE HCL 25 MG/ML IJ SOLN: 6.2500 mg | INTRAMUSCULAR | Status: DC | PRN

## 2019-12-11 MED ORDER — LIDOCAINE 2% (20 MG/ML) 5 ML SYRINGE
INTRAMUSCULAR | Status: AC
  Filled 2019-12-11: qty 5

## 2019-12-11 MED ORDER — OXYCODONE HCL 5 MG/5ML PO SOLN: 5.0000 mg | Freq: Once | ORAL | Status: DC | PRN

## 2019-12-11 MED ORDER — CHLORHEXIDINE GLUCONATE CLOTH 2 % EX PADS: 6.0000 | MEDICATED_PAD | Freq: Once | CUTANEOUS | Status: DC

## 2019-12-11 MED ORDER — MIDAZOLAM HCL 2 MG/2ML IJ SOLN
INTRAMUSCULAR | Status: AC
  Filled 2019-12-11: qty 2

## 2019-12-11 MED ORDER — ONDANSETRON HCL 4 MG/2ML IJ SOLN
INTRAMUSCULAR | Status: AC
  Filled 2019-12-11: qty 2

## 2019-12-11 MED ORDER — OXYCODONE HCL 5 MG PO TABS: 5.0000 mg | ORAL_TABLET | Freq: Once | ORAL | Status: DC | PRN

## 2019-12-11 MED ORDER — ACETAMINOPHEN 500 MG PO TABS
ORAL_TABLET | ORAL | Status: AC
  Filled 2019-12-11: qty 2

## 2019-12-11 MED ORDER — BUPIVACAINE HCL 0.25 % IJ SOLN
INTRAMUSCULAR | Status: DC | PRN
  Administered 2019-12-11: 5 mL

## 2019-12-11 MED ORDER — CELECOXIB 200 MG PO CAPS
ORAL_CAPSULE | ORAL | Status: AC
  Filled 2019-12-11: qty 1

## 2019-12-11 MED ORDER — CELECOXIB 200 MG PO CAPS: 200.0000 mg | ORAL_CAPSULE | ORAL | Status: DC

## 2019-12-11 MED ORDER — LIDOCAINE 2% (20 MG/ML) 5 ML SYRINGE
INTRAMUSCULAR | Status: DC | PRN
  Administered 2019-12-11: 60 mg via INTRAVENOUS

## 2019-12-11 MED ORDER — HYDROCODONE-ACETAMINOPHEN 5-325 MG PO TABS: 1.0000 | ORAL_TABLET | Freq: Four times a day (QID) | ORAL | 0 refills | Status: DC | PRN

## 2019-12-11 MED ORDER — MIDAZOLAM HCL 5 MG/5ML IJ SOLN
INTRAMUSCULAR | Status: DC | PRN
  Administered 2019-12-11: 2 mg via INTRAVENOUS

## 2019-12-11 MED ORDER — FENTANYL CITRATE (PF) 100 MCG/2ML IJ SOLN
INTRAMUSCULAR | Status: AC
  Filled 2019-12-11: qty 2

## 2019-12-11 MED ORDER — BUPIVACAINE HCL (PF) 0.25 % IJ SOLN
INTRAMUSCULAR | Status: AC
  Filled 2019-12-11: qty 30

## 2019-12-11 MED ORDER — CEFAZOLIN SODIUM-DEXTROSE 2-4 GM/100ML-% IV SOLN
2.0000 g | INTRAVENOUS | Status: AC
  Administered 2019-12-11: 2 g via INTRAVENOUS

## 2019-12-11 MED ORDER — CEFAZOLIN SODIUM-DEXTROSE 2-4 GM/100ML-% IV SOLN
INTRAVENOUS | Status: AC
  Filled 2019-12-11: qty 100

## 2019-12-11 SURGICAL SUPPLY — 30 items
BLADE SURG 15 STRL LF DISP TIS (BLADE) ×1 IMPLANT
BLADE SURG 15 STRL SS (BLADE) ×1
COVER BACK TABLE 60X90IN (DRAPES) ×2 IMPLANT
COVER MAYO STAND STRL (DRAPES) ×2 IMPLANT
DERMABOND ADVANCED (GAUZE/BANDAGES/DRESSINGS) ×1
DERMABOND ADVANCED .7 DNX12 (GAUZE/BANDAGES/DRESSINGS) ×1 IMPLANT
DRAPE LAPAROTOMY 100X72 PEDS (DRAPES) ×2 IMPLANT
DRAPE UTILITY XL STRL (DRAPES) ×2 IMPLANT
ELECT COATED BLADE 2.86 ST (ELECTRODE) ×2 IMPLANT
ELECT REM PT RETURN 9FT ADLT (ELECTROSURGICAL) ×2
ELECTRODE REM PT RTRN 9FT ADLT (ELECTROSURGICAL) ×1 IMPLANT
GLOVE BIO SURGEON STRL SZ 6.5 (GLOVE) ×2 IMPLANT
GLOVE BIOGEL PI IND STRL 7.0 (GLOVE) ×1 IMPLANT
GLOVE BIOGEL PI IND STRL 8 (GLOVE) ×1 IMPLANT
GLOVE BIOGEL PI INDICATOR 7.0 (GLOVE) ×1
GLOVE BIOGEL PI INDICATOR 8 (GLOVE) ×1
GLOVE ECLIPSE 8.0 STRL XLNG CF (GLOVE) ×2 IMPLANT
GOWN STRL REUS W/ TWL LRG LVL3 (GOWN DISPOSABLE) ×2 IMPLANT
GOWN STRL REUS W/TWL LRG LVL3 (GOWN DISPOSABLE) ×2
NEEDLE HYPO 25X1 1.5 SAFETY (NEEDLE) ×2 IMPLANT
NS IRRIG 1000ML POUR BTL (IV SOLUTION) ×2 IMPLANT
PENCIL SMOKE EVACUATOR (MISCELLANEOUS) ×2 IMPLANT
SET BASIN DAY SURGERY F.S. (CUSTOM PROCEDURE TRAY) ×2 IMPLANT
SLEEVE SCD COMPRESS KNEE MED (MISCELLANEOUS) ×2 IMPLANT
SPONGE LAP 4X18 RFD (DISPOSABLE) ×2 IMPLANT
SUT MON AB 4-0 PC3 18 (SUTURE) ×2 IMPLANT
SUT VICRYL 3-0 CR8 SH (SUTURE) ×2 IMPLANT
SYR CONTROL 10ML LL (SYRINGE) ×2 IMPLANT
TOWEL GREEN STERILE FF (TOWEL DISPOSABLE) ×2 IMPLANT
TRAY DSU PREP LF (CUSTOM PROCEDURE TRAY) ×2 IMPLANT

## 2019-12-11 NOTE — H&P (Signed)
Sabrina Brennan  Location: [facility] Patient #: [id number] DOB: 02/15/1991 Single / Language: Sabrina Brennan / Race: Black or African American Female  History of Present Illness Sabrina Brennan A. Len Azeez MD; 11/05/2019 12:16 PM) Patient words: Patient presents for evaluation of the left groin cyst. The present since December. Cyst locations on the inner upper aspect of her left thigh. He rubs against her clothing and drains. It is called mild to moderate discomfort. There is been recent drainage or redness.  The patient is a 29 year old female.   Past Surgical History  No pertinent past surgical history  Diagnostic Studies History  Colonoscopy never Mammogram never Pap Smear 1-5 years ago  Allergies  No Known Drug Allergies [11/05/2019]: Allergies Reconciled  Medication History ZyrTEC (10MG  Tablet, Oral) Active. Medications Reconciled  Social History  Alcohol use Occasional alcohol use. Caffeine use Coffee. No drug use Tobacco use Never smoker.  Family History  Alcohol Abuse Father.  Pregnancy / Birth History Contraceptive History Depo-provera. Gravida 0 Para 0 Regular periods  Other Problems  No pertinent past medical history     Review of Systems General Not Present- Appetite Loss, Chills, Fatigue, Fever, Night Sweats, Weight Gain and Weight Loss. Skin Not Present- Change in Wart/Mole, Dryness, Hives, Jaundice, New Lesions, Non-Healing Wounds, Rash and Ulcer. HEENT Not Present- Earache, Hearing Loss, Hoarseness, Nose Bleed, Oral Ulcers, Ringing in the Ears, Seasonal Allergies, Sinus Pain, Sore Throat, Visual Disturbances, Wears glasses/contact lenses and Yellow Eyes. Breast Not Present- Breast Mass, Breast Pain, Nipple Discharge and Skin Changes. Cardiovascular Not Present- Chest Pain, Difficulty Breathing Lying Down, Leg Cramps, Palpitations, Rapid Heart Rate, Shortness of Breath and Swelling of Extremities. Gastrointestinal  Not Present- Abdominal Pain, Bloating, Bloody Stool, Change in Bowel Habits, Chronic diarrhea, Constipation, Difficulty Swallowing, Excessive gas, Gets full quickly at meals, Hemorrhoids, Indigestion, Nausea, Rectal Pain and Vomiting. Female Genitourinary Not Present- Frequency, Nocturia, Painful Urination, Pelvic Pain and Urgency. Neurological Not Present- Decreased Memory, Fainting, Headaches, Numbness, Seizures, Tingling, Tremor, Trouble walking and Weakness. Psychiatric Not Present- Anxiety, Bipolar, Change in Sleep Pattern, Depression, Fearful and Frequent crying. Endocrine Not Present- Cold Intolerance, Excessive Hunger, Hair Changes, Heat Intolerance, Hot flashes and New Diabetes. Hematology Not Present- Blood Thinners, Easy Bruising, Excessive bleeding, Gland problems, HIV and Persistent Infections.  Vitals  11/05/2019 9:37 AM Weight: 164.2 lb Height: 60in Body Surface Area: 1.72 m Body Mass Index: 32.07 kg/m  Temp.: 98.38F  Pulse: 95 (Regular)  BP: 128/74 (Sitting, Left Arm, Standard)        Physical Exam  Integumentary Note: 3 cm epidermal inclusion cyst medial left thigh. No redness or drainage. No fluctuance.  Head and Neck Head-normocephalic, atraumatic with no lesions or palpable masses.  Chest and Lung Exam Chest and lung exam reveals -quiet, even and easy respiratory effort with no use of accessory muscles and on auscultation, normal breath sounds, no adventitious sounds and normal vocal resonance. Inspection Chest Wall - Normal. Back - normal.  Cardiovascular Cardiovascular examination reveals -on palpation PMI is normal in location and amplitude, no palpable S3 or S4. Normal cardiac borders., normal heart sounds, regular rate and rhythm with no murmurs, carotid auscultation reveals no bruits and normal pedal pulses bilaterally.  Neurologic Neurologic evaluation reveals -alert and oriented x 3 with no impairment of recent or  remote memory. Mental Status-Normal.  Musculoskeletal Normal Exam - Left-Upper Extremity Strength Normal and Lower Extremity Strength Normal. Normal Exam - Right-Upper Extremity Strength Normal and Lower Extremity Strength Normal.    Assessment &  Plan   SEBACEOUS CYST (L72.3) Impression: 3 cm left inner thigh symptomatic epidermal inclusion cyst Patient desires excision. Risks and benefits of surgery discussed as well as complications of bleeding, infection, recurrence, and any further treatment and/or surgery. Postoperative care and potential complications discussed with the patient long-term expectations. Total time 30 minutes for augmentation, examination, reviewing medical records, is causing surgery and complications, and nonoperative discussion with documentation  Current Plans The pathophysiology of skin & subcutaneous masses was discussed. Natural history risks without surgery were discussed. I recommended surgery to remove the mass. I explained the technique of removal with use of local anesthesia & possible need for more aggressive sedation/anesthesia for patient comfort.  Risks such as bleeding, infection, wound breakdown, heart attack, death, and other risks were discussed. I noted a good likelihood this will help address the problem. Possibility that this will not correct all symptoms was explained. Possibility of regrowth/recurrence of the mass was discussed. We will work to minimize complications. Questions were answered. The patient expresses understanding & wishes to proceed with surgery.  Pt Education - CCS Free Text Education/Instructions: discussed with patient and provided information. You are being scheduled for surgery- Our schedulers will call you.  You should hear from our office's scheduling department within 5 working days about the location, date, and time of surgery. We try to make accommodations for patient's preferences in scheduling  surgery, but sometimes the OR schedule or the surgeon's schedule prevents [place] from making those accommodations.  If you have not heard from our office [phone]) in 5 working days, call the office and ask for your surgeon's nurse.  If you have other questions about your diagnosis, plan, or surgery, call the office and ask for your surgeon's nurse.

## 2019-12-11 NOTE — Interval H&P Note (Signed)
History and Physical Interval Note:  12/11/2019 2:03 PM  Sabrina Brennan  has presented today for surgery, with the diagnosis of CYST LEFT MEDIAL THIGH.  The various methods of treatment have been discussed with the patient and family. After consideration of risks, benefits and other options for treatment, the patient has consented to  Procedure(s): EXCISION LEFT THIGH CYST (Left) as a surgical intervention.  The patient's history has been reviewed, patient examined, no change in status, stable for surgery.  I have reviewed the patient's chart and labs.  Questions were answered to the patient's satisfaction.     Kazmir Oki A Lynley Killilea

## 2019-12-11 NOTE — Anesthesia Preprocedure Evaluation (Signed)
Anesthesia Evaluation  Patient identified by MRN, date of birth, ID band Patient awake    Reviewed: Allergy & Precautions, NPO status , Patient's Chart, lab work & pertinent test results  Airway Mallampati: II       Dental  (+) Dental Advisory Given   Pulmonary neg pulmonary ROS,    breath sounds clear to auscultation       Cardiovascular negative cardio ROS   Rhythm:Regular Rate:Normal     Neuro/Psych negative neurological ROS     GI/Hepatic negative GI ROS, Neg liver ROS,   Endo/Other  negative endocrine ROS  Renal/GU negative Renal ROS     Musculoskeletal   Abdominal   Peds  Hematology negative hematology ROS (+)   Anesthesia Other Findings   Reproductive/Obstetrics                             Anesthesia Physical Anesthesia Plan  ASA: I  Anesthesia Plan: General   Post-op Pain Management:    Induction: Intravenous  PONV Risk Score and Plan: 3 and Dexamethasone, Ondansetron and Treatment may vary due to age or medical condition  Airway Management Planned: LMA  Additional Equipment: None  Intra-op Plan:   Post-operative Plan: Extubation in OR  Informed Consent: I have reviewed the patients History and Physical, chart, labs and discussed the procedure including the risks, benefits and alternatives for the proposed anesthesia with the patient or authorized representative who has indicated his/her understanding and acceptance.     Dental advisory given  Plan Discussed with: CRNA  Anesthesia Plan Comments:         Anesthesia Quick Evaluation

## 2019-12-11 NOTE — Op Note (Signed)
Preoperative diagnosis: Left medial thigh 3 cm sebaceous cyst  Postoperative diagnosis: Same  Procedure: Excision of left medial thigh 3 cm sebaceous cyst subcutaneous  Surgeon: Erroll Luna, MD  Anesthesia: LMA general with 0.25% Sensorcaine local  EBL: Minimal  Specimen: Left thigh cyst to pathology  Drains: None  Indications for procedure: The patient is a 29 year old female with a left medial thigh sebaceous cyst is been causing pain and drainage.  This is not resolved with conservative management she presents for excision.  Risk, benefits and other treatment options discussed as well as potential recurrence, new cyst, wound complications, pain, and the need further surgeries and/or procedures.The procedure has been discussed with the patient.  Alternative therapies have been discussed with the patient.  Operative risks include bleeding,  Infection,  Organ injury,  Nerve injury,  Blood vessel injury,  DVT,  Pulmonary embolism,  Death,  And possible reoperation.  Medical management risks include worsening of present situation.  The success of the procedure is 50 -90 % at treating patients symptoms.  The patient understands and agrees to proceed.  Description of procedure: The patient was met in the holding area and the cyst was marked with the assistance of the patient.  She was then taken back to the operating.  She is placed upon the OR table.  After induction of general esthesia both legs were frog-legged in the left inner thigh was prepped and draped in sterile fashion where the mark was. Timeout performed.  Local anesthetic was infiltrated.  Curvilinear incision made above below the cyst.  All tissue around the cyst was excised with a grossly negative margin.  This was all contained within the skin and subcutaneous fat.  This appeared to be an epidermal inclusion cyst.  No signs of infection.  Wound was  made hemostatic with cautery and closed with 3-0 Vicryl.  Dermabond applied. patient  was awoke extubated taken recovery in satisfactory condition.  All counts were correct.

## 2019-12-11 NOTE — Anesthesia Postprocedure Evaluation (Signed)
Anesthesia Post Note  Patient: [name]  Procedure(s) Performed: EXCISION LEFT THIGH CYST (Left Thigh)     Patient location during evaluation: PACU Anesthesia Type: General Level of consciousness: awake and alert Pain management: pain level controlled Vital Signs Assessment: post-procedure vital signs reviewed and stable Respiratory status: spontaneous breathing, nonlabored ventilation and respiratory function stable Cardiovascular status: blood pressure returned to baseline and stable Postop Assessment: no apparent nausea or vomiting Anesthetic complications: no    Last Vitals:  Vitals:   12/11/19 1530 12/11/19 1609  BP:  104/62  Pulse: 70 61  Resp: 14 16  Temp:  37.1 C  SpO2: 100% 99%    Last Pain:  Vitals:   12/11/19 1609  TempSrc:   PainSc: 0-No pain                 Lucretia Kern

## 2019-12-11 NOTE — Discharge Instructions (Signed)

## 2019-12-11 NOTE — Anesthesia Procedure Notes (Signed)
Procedure Name: LMA Insertion Date/Time: 12/11/2019 2:18 PM Performed by: Burna Cash, CRNA Pre-anesthesia Checklist: Patient identified, Emergency Drugs available, Suction available and Patient being monitored Patient Re-evaluated:Patient Re-evaluated prior to induction Oxygen Delivery Method: Circle system utilized Preoxygenation: Pre-oxygenation with 100% oxygen Induction Type: IV induction Ventilation: Mask ventilation without difficulty LMA: LMA inserted LMA Size: 4.0 Number of attempts: 1 Airway Equipment and Method: Bite block Placement Confirmation: positive ETCO2 Tube secured with: Tape Dental Injury: Teeth and Oropharynx as per pre-operative assessment

## 2019-12-11 NOTE — Transfer of Care (Signed)
Immediate Anesthesia Transfer of Care Note  Patient: Sabrina Brennan  Procedure(s) Performed: EXCISION LEFT THIGH CYST (Left Thigh)  Patient Location: PACU  Anesthesia Type:General  Level of Consciousness: sedated  Airway & Oxygen Therapy: Patient Spontanous Breathing and Patient connected to face mask oxygen  Post-op Assessment: Report given to RN and Post -op Vital signs reviewed and stable  Post vital signs: Reviewed and stable  Last Vitals:  Vitals Value Taken Time  BP 97/65 12/11/19 1445  Temp    Pulse 69 12/11/19 1447  Resp 14 12/11/19 1447  SpO2 100 % 12/11/19 1447  Vitals shown include unvalidated device data.  Last Pain:  Vitals:   12/11/19 1232  TempSrc: Temporal  PainSc: 5       Patients Stated Pain Goal: 2 (12/11/19 1232)  Complications: No apparent anesthesia complications

## 2019-12-12 ENCOUNTER — Encounter: Payer: Self-pay | Admitting: *Deleted

## 2019-12-12 LAB — SURGICAL PATHOLOGY

## 2020-02-01 ENCOUNTER — Ambulatory Visit (HOSPITAL_COMMUNITY): Admit: 2020-02-01 | Discharge status: Against Medical Advice | Payer: BC Managed Care – PPO

## 2020-02-04 ENCOUNTER — Ambulatory Visit (INDEPENDENT_AMBULATORY_CARE_PROVIDER_SITE_OTHER)
Admission: RE | Admit: 2020-02-04 | Discharge: 2020-02-04 | Disposition: A | Discharge status: Home / Self Care | Payer: BC Managed Care – PPO | Source: Ambulatory Visit

## 2020-02-04 DIAGNOSIS — L309 Dermatitis, unspecified: Secondary | ICD-10-CM | POA: Diagnosis not present

## 2020-02-04 NOTE — Discharge Instructions (Signed)
Apply zinc-type cream, such as Desitin to the irritated areas.    Follow up with your primary care provider or come here to be seen in person if your symptoms are not improving.

## 2020-02-04 NOTE — ED Provider Notes (Signed)
Virtual Visit via Video Note:  Sabrina Brennan  initiated request for Telemedicine visit with [facility] Urgent Care team. I connected with Sabrina Brennan  on 02/04/2020 at 12:38 PM  for a synchronized telemedicine visit using a video enabled HIPPA compliant telemedicine application. I verified that I am speaking with Sabrina Brennan  using two identifiers. Mickie Bail, NP  was physically located in a [facility] Urgent care site and KAREEN JEFFERYS was located at a different location.   The limitations of evaluation and management by telemedicine as well as the availability of in-person appointments were discussed. Patient was informed that she  may incur a bill ( including co-pay) for this virtual visit encounter. Sabrina Brennan  expressed understanding and gave verbal consent to proceed with virtual visit.     History of Present Illness:Sabrina Brennan  is a 29 y.o. female presents for evaluation of redness and swelling of inner thighs x 1 days.  Improved today but still red.  Her symptoms started after wearing jean shorts to work and her thighs rubbing together all day yesterday.  She denies open sores, fever, chills, SOB, abdominal pain, dysuria, vaginal discharge, pelvic pain, or other symptoms.  Treated at home with Vitamin E.     No Known Allergies   Past Medical History:  Diagnosis Date  . Medical history non-contributory      Social History   Tobacco Use  . Smoking status: Never Smoker  . Smokeless tobacco: Never Used  Vaping Use  . Vaping Use: Never used  Substance Use Topics  . Alcohol use: Yes    Alcohol/week: 2.0 standard drinks    Types: 2 Standard drinks or equivalent per week  . Drug use: No    ROS: as stated in HPI.  All other systems reviewed and negative.      Observations/Objective: Physical Exam  VITALS: Patient denies fever. GENERAL: Alert, appears well and in no acute distress. HEENT: Atraumatic. Oral mucosa appears moist. NECK: Normal movements of  the head and neck. CARDIOPULMONARY: No increased WOB. Speaking in clear sentences. I:E ratio WNL.  MS: Moves all visible extremities without noticeable abnormality. PSYCH: Pleasant and cooperative, well-groomed. Speech normal rate and rhythm. Affect is appropriate. Insight and judgement are appropriate. Attention is focused, linear, and appropriate.  NEURO: CN grossly intact. Oriented as arrived to appointment on time with no prompting. Moves both UE equally.  SKIN: Limited ability to see area on video but mild redness to upper inner thighs. No lesions.    Assessment and Plan:    ICD-10-CM   1. Dermatitis  L30.9        Follow Up Instructions: Discussed limitations of a video visit for her particular symptoms.  Instructed her to attempt symptomatic treatment with over-the-counter Desitin cream.  Instructed her to come to the urgent care for in person evaluation or follow-up with her PCP if her symptoms are not improving.  Patient agrees to plan of care.      I discussed the assessment and treatment plan with the patient. The patient was provided an opportunity to ask questions and all were answered. The patient agreed with the plan and demonstrated an understanding of the instructions.   The patient was advised to call back or seek an in-person evaluation if the symptoms worsen or if the condition fails to improve as anticipated.      Mickie Bail, NP  02/04/2020 12:38 PM  Mickie Bail, NP 02/04/20 [phone]

## 2020-04-02 ENCOUNTER — Ambulatory Visit
Admission: EM | Admit: 2020-04-02 | Discharge: 2020-04-02 | Disposition: A | Discharge status: Home / Self Care | Payer: 59

## 2020-04-02 DIAGNOSIS — M79672 Pain in left foot: Secondary | ICD-10-CM

## 2020-04-02 DIAGNOSIS — M79671 Pain in right foot: Secondary | ICD-10-CM | POA: Diagnosis not present

## 2020-04-02 DIAGNOSIS — M7989 Other specified soft tissue disorders: Secondary | ICD-10-CM

## 2020-04-02 NOTE — Discharge Instructions (Addendum)
Wear compression socks to help prevent swelling and pain in your legs and feet  Monitor your blood pressure daily in the morning for at least the next 2 weeks  Take these recordings to your primary care provider or this office  Follow up with primary care as needed

## 2020-04-02 NOTE — ED Provider Notes (Addendum)
[facility]   [id number] 04/02/20 Arrival Time: 3016  WF:UXNAT PAIN  SUBJECTIVE: History from: patient. Sabrina Brennan is a 29 y.o. female complains of bilateral lower leg and foot pain and swelling that occurs when she is on her feet. Reports that this has been occurring on and off for the last 6 months. Has not attempted OTC treatments for this. Denies a precipitating event or specific injury. Symptoms are made worse with being on her feet for extended periods of time. Reports hx HTN, but states that she has not had any issues with this and is not currently being treated for HTN. Denies similar symptoms in the past. Denies fever, chills, erythema, ecchymosis, effusion, weakness, numbness and tingling, saddle paresthesias, loss of bowel or bladder function.      ROS: As per HPI.  All other pertinent ROS negative.     Past Medical History:  Diagnosis Date  . Medical history non-contributory    Past Surgical History:  Procedure Laterality Date  . EXCISION MASS LOWER EXTREMETIES Left 12/11/2019   Procedure: EXCISION LEFT THIGH CYST;  Surgeon: Harriette Bouillon, MD;  Location: [facility];  Service: General;  Laterality: Left;  . NO PAST SURGERIES     No Known Allergies No current facility-administered medications on file prior to encounter.   Current Outpatient Medications on File Prior to Encounter  Medication Sig Dispense Refill  . medroxyPROGESTERone (DEPO-PROVERA) 150 MG/ML injection Inject 150 mg into the muscle every 3 (three) months.    Marland Kitchen aspirin-acetaminophen-caffeine (EXCEDRIN MIGRAINE) 250-250-65 MG tablet Take 2 tablets by mouth every 6 (six) hours as needed for headache.    . cetirizine (ZYRTEC) 10 MG tablet Take 1 tablet (10 mg total) by mouth daily. 30 tablet 0  . fluticasone (FLONASE) 50 MCG/ACT nasal spray Place 1 spray into both nostrils daily. 16 g 2  . HYDROcodone-acetaminophen (NORCO/VICODIN) 5-325 MG tablet Take 1 tablet by mouth every 6 (six)  hours as needed for moderate pain. 15 tablet 0   Social History   Socioeconomic History  . Marital status: Single    Spouse name: Not on file  . Number of children: Not on file  . Years of education: Not on file  . Highest education level: Not on file  Occupational History  . Not on file  Tobacco Use  . Smoking status: Never Smoker  . Smokeless tobacco: Never Used  Vaping Use  . Vaping Use: Never used  Substance and Sexual Activity  . Alcohol use: Yes    Alcohol/week: 2.0 standard drinks    Types: 2 Standard drinks or equivalent per week  . Drug use: No  . Sexual activity: Yes    Birth control/protection: Injection  Other Topics Concern  . Not on file  Social History Narrative  . Not on file   Social Determinants of Health   Financial Resource Strain:   . Difficulty of Paying Living Expenses: Not on file  Food Insecurity:   . Worried About Programme researcher, broadcasting/film/video in the Last Year: Not on file  . Ran Out of Food in the Last Year: Not on file  Transportation Needs:   . Lack of Transportation (Medical): Not on file  . Lack of Transportation (Non-Medical): Not on file  Physical Activity:   . Days of Exercise per Week: Not on file  . Minutes of Exercise per Session: Not on file  Stress:   . Feeling of Stress : Not on file  Social Connections:   .  Frequency of Communication with Friends and Family: Not on file  . Frequency of Social Gatherings with Friends and Family: Not on file  . Attends Religious Services: Not on file  . Active Member of Clubs or Organizations: Not on file  . Attends Banker Meetings: Not on file  . Marital Status: Not on file  Intimate Partner Violence:   . Fear of Current or Ex-Partner: Not on file  . Emotionally Abused: Not on file  . Physically Abused: Not on file  . Sexually Abused: Not on file   History reviewed. No pertinent family history.  OBJECTIVE:  Vitals:   04/02/20 1006  BP: 109/76  Pulse: 83  Resp: 18  Temp: 99  F (37.2 C)  TempSrc: Oral  SpO2: 96%    General appearance: ALERT; in no acute distress.  Head: NCAT Lungs: Normal respiratory effort CV: pulses 2+ bilaterally. Cap refill < 2 seconds Musculoskeletal:  Inspection: Skin warm, dry, clear and intact without obvious erythema, effusion, or ecchymosis.  Palpation: Nontender to palpation ROM: FROM active and passive Skin: warm and dry Neurologic: Ambulates without difficulty; Sensation intact about the upper/ lower extremities Psychological: alert and cooperative; normal mood and affect  DIAGNOSTIC STUDIES:  No results found.   ASSESSMENT & PLAN:  1. Leg swelling   2. Foot swelling   3. Foot pain, bilateral    Wear compression socks when on your feet for longer periods of time, or when working Check BP daily for the next 2 weeks and log this information CBC and CMP pending Will call with abnormal results and treat accordingly. Swelling from fluid retention vs HTN vs kidney related? Follow up with PCP or this office as needed Return or go to the ER if you have any new or worsening symptoms (fever, chills, chest pain, abdominal pain, changes in bowel or bladder habits, pain radiating into lower legs)   Reviewed expectations re: course of current medical issues. Questions answered. Outlined signs and symptoms indicating need for more acute intervention. Patient verbalized understanding. After Visit Summary given.       Moshe Cipro, NP 04/02/20 1028    Moshe Cipro, NP 04/02/20 1029

## 2020-04-02 NOTE — ED Triage Notes (Signed)
Pt presents with swelling in the foot and pain in the ankles for the past 6 months. Pt states the swelling in the foot is all day.

## 2020-04-03 LAB — CBC WITH DIFFERENTIAL/PLATELET
Basophils Absolute: 0 10*3/uL (ref 0.0–0.2)
Basos: 0 %
EOS (ABSOLUTE): 0.1 10*3/uL (ref 0.0–0.4)
Eos: 2 %
Hematocrit: 38.6 % (ref 34.0–46.6)
Hemoglobin: 13.1 g/dL (ref 11.1–15.9)
Immature Grans (Abs): 0 10*3/uL (ref 0.0–0.1)
Immature Granulocytes: 0 %
Lymphocytes Absolute: 2.9 10*3/uL (ref 0.7–3.1)
Lymphs: 40 %
MCH: 31.2 pg (ref 26.6–33.0)
MCHC: 33.9 g/dL (ref 31.5–35.7)
MCV: 92 fL (ref 79–97)
Monocytes Absolute: 0.5 10*3/uL (ref 0.1–0.9)
Monocytes: 7 %
Neutrophils Absolute: 3.7 10*3/uL (ref 1.4–7.0)
Neutrophils: 51 %
Platelets: 369 10*3/uL (ref 150–450)
RBC: 4.2 x10E6/uL (ref 3.77–5.28)
RDW: 12.5 % (ref 11.7–15.4)
WBC: 7.3 10*3/uL (ref 3.4–10.8)

## 2020-04-03 LAB — COMPREHENSIVE METABOLIC PANEL
ALT: 16 IU/L (ref 0–32)
AST: 16 IU/L (ref 0–40)
Albumin/Globulin Ratio: 1.5 (ref 1.2–2.2)
Albumin: 4.4 g/dL (ref 3.9–5.0)
Alkaline Phosphatase: 70 IU/L (ref 48–121)
BUN/Creatinine Ratio: 10 (ref 9–23)
BUN: 8 mg/dL (ref 6–20)
Bilirubin Total: 0.2 mg/dL (ref 0.0–1.2)
CO2: 18 mmol/L — ABNORMAL LOW (ref 20–29)
Calcium: 9.2 mg/dL (ref 8.7–10.2)
Chloride: 107 mmol/L — ABNORMAL HIGH (ref 96–106)
Creatinine, Ser: 0.81 mg/dL (ref 0.57–1.00)
GFR calc Af Amer: 115 mL/min/{1.73_m2} (ref >59)
GFR calc non Af Amer: 100 mL/min/{1.73_m2} (ref >59)
Globulin, Total: 3 g/dL (ref 1.5–4.5)
Glucose: 91 mg/dL (ref 65–99)
Potassium: 3.9 mmol/L (ref 3.5–5.2)
Sodium: 142 mmol/L (ref 134–144)
Total Protein: 7.4 g/dL (ref 6.0–8.5)

## 2020-04-05 ENCOUNTER — Ambulatory Visit (HOSPITAL_COMMUNITY): Admit: 2020-04-05 | Discharge status: Against Medical Advice | Payer: 59

## 2020-04-11 ENCOUNTER — Ambulatory Visit: Payer: 59 | Admitting: Family Medicine

## 2020-04-16 ENCOUNTER — Ambulatory Visit
Admission: EM | Admit: 2020-04-16 | Discharge: 2020-04-16 | Disposition: A | Discharge status: Home / Self Care | Payer: 59 | Attending: Emergency Medicine | Admitting: Emergency Medicine

## 2020-04-16 ENCOUNTER — Other Ambulatory Visit: Payer: 59

## 2020-04-16 ENCOUNTER — Other Ambulatory Visit: Payer: Self-pay

## 2020-04-16 ENCOUNTER — Other Ambulatory Visit: Payer: Self-pay | Admitting: Critical Care Medicine

## 2020-04-16 DIAGNOSIS — Z20822 Contact with and (suspected) exposure to covid-19: Secondary | ICD-10-CM

## 2020-04-16 DIAGNOSIS — R0981 Nasal congestion: Secondary | ICD-10-CM

## 2020-04-16 MED ORDER — CETIRIZINE HCL 10 MG PO TABS
10.0000 mg | ORAL_TABLET | Freq: Every day | ORAL | 0 refills | Status: DC
Start: 2020-04-16 — End: 2020-10-29

## 2020-04-16 MED ORDER — FLUTICASONE PROPIONATE 50 MCG/ACT NA SUSP
1.0000 | Freq: Every day | NASAL | 0 refills | Status: DC
Start: 2020-04-16 — End: 2020-10-29

## 2020-04-16 NOTE — ED Provider Notes (Signed)
[facility]    CSN: [id number] Arrival date & time: 04/16/20  1111      History   Chief Complaint Chief Complaint  Patient presents with  . Sinus Problem    HPI Sabrina Brennan is a 29 y.o. female  Presenting for 2-day course of "sinus issues ".  Patient states she has a history thereof.  Has not taken allergy medication or nasal spray which typically alleviates it.  Sudafed has not been helpful.  Denies cough, fever, known sick contacts.  Declining Covid test that she has an appointment later today that she prefers to keep.  Past Medical History:  Diagnosis Date  . Medical history non-contributory     Patient Active Problem List   Diagnosis Date Noted  . 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 Breast mass in female 08/29/2013    Past Surgical History:  Procedure Laterality Date  . EXCISION MASS LOWER EXTREMETIES Left 12/11/2019   Procedure: EXCISION LEFT THIGH CYST;  Surgeon: Harriette Bouillon, MD;  Location: [facility];  Service: General;  Laterality: Left;  . NO PAST SURGERIES      OB History    Gravida  1   Para  0   Term      Preterm      AB  1   Living        SAB      TAB  1   Ectopic      Multiple      Live Births           Obstetric Comments  1st Menstrual Cycle:  14         Home Medications    Prior to Admission medications   Medication Sig Start Date End Date Taking? Authorizing Provider  cetirizine (ZYRTEC) 10 MG tablet Take 1 tablet (10 mg total) by mouth daily. 04/16/20   Hall-Potvin, [place], PA-C  fluticasone (FLONASE) 50 MCG/ACT nasal spray Place 1 spray into both nostrils daily. 04/16/20   Hall-Potvin, [place], PA-C  HYDROcodone-acetaminophen (NORCO/VICODIN) 5-325 MG tablet Take 1 tablet by mouth every 6 (six) hours as needed for moderate pain. 12/11/19   Cornett, Maisie Fus, MD  medroxyPROGESTERone (DEPO-PROVERA) 150 MG/ML injection Inject 150  mg into the muscle every 3 (three) months.    [provider]    Family History Family History  Problem Relation Age of Onset  . Healthy Mother   . Healthy Father     Social History Social History   Tobacco Use  . Smoking status: Never Smoker  . Smokeless tobacco: Never Used  Vaping Use  . Vaping Use: Never used  Substance Use Topics  . Alcohol use: Yes    Alcohol/week: 2.0 standard drinks    Types: 2 Standard drinks or equivalent per week  . Drug use: No     Allergies   Patient has no known allergies.   Review of Systems As per HPI   Physical Exam Triage Vital Signs ED Triage Vitals [04/16/20 1229]  Enc Vitals Group     BP 115/82     Pulse Rate 70     Resp 20     Temp 97.7 F (36.5 C)     Temp Source Oral     SpO2 98 %     Weight      Height      Head Circumference      Peak Flow  Pain Score      Pain Loc      Pain Edu?      Excl. in GC?    No data found.  Updated Vital Signs BP 115/82   Pulse 70   Temp 97.7 F (36.5 C) (Oral)   Resp 20   SpO2 98%   Visual Acuity Right Eye Distance:   Left Eye Distance:   Bilateral Distance:    Right Eye Near:   Left Eye Near:    Bilateral Near:     Physical Exam Constitutional:      General: She is not in acute distress.    Appearance: She is not ill-appearing.  HENT:     Head: Normocephalic and atraumatic.     Right Ear: Tympanic membrane and ear canal normal.     Left Ear: Tympanic membrane and ear canal normal.     Nose:     Comments: Bilateral turbinate edema (L>R)    Mouth/Throat:     Mouth: Mucous membranes are moist.     Pharynx: Oropharynx is clear.  Eyes:     General: No scleral icterus.    Pupils: Pupils are equal, round, and reactive to light.  Cardiovascular:     Rate and Rhythm: Normal rate and regular rhythm.  Pulmonary:     Effort: Pulmonary effort is normal. No respiratory distress.     Breath sounds: No wheezing.  Musculoskeletal:     Cervical back: No  tenderness.  Lymphadenopathy:     Cervical: No cervical adenopathy.  Skin:    Coloration: Skin is not jaundiced or pale.  Neurological:     Mental Status: She is alert and oriented to person, place, and time.      UC Treatments / Results  Labs (all labs ordered are listed, but only abnormal results are displayed) Labs Reviewed - No data to display  EKG   Radiology No results found.  Procedures Procedures (including critical care time)  Medications Ordered in UC Medications - No data to display  Initial Impression / Assessment and Plan / UC Course  I have reviewed the triage vital signs and the nursing notes.  Pertinent labs & imaging results that were available during my care of the patient were reviewed by me and considered in my medical decision making (see chart for details).     Patient afebrile, nontoxic, with SpO2 98%.  Patient electing to get Covid testing at testing site.  Will treat supportively as outlined below.  Return precautions discussed, patient verbalized understanding and is agreeable to plan. Final Clinical Impressions(s) / UC Diagnoses   Final diagnoses:  Nasal congestion     Discharge Instructions     Start flonase, atrovent nasal spray for nasal congestion/drainage. You can use over the counter nasal saline rinse such as neti pot for nasal congestion. Keep hydrated, your urine should be clear to pale yellow in color. Tylenol/motrin for fever and pain. Monitor for any worsening of symptoms, chest pain, shortness of breath, wheezing, swelling of the throat, go to the emergency department for further evaluation needed.     ED Prescriptions    Medication Sig Dispense Auth. Provider   cetirizine (ZYRTEC) 10 MG tablet Take 1 tablet (10 mg total) by mouth daily. 30 tablet Hall-Potvin, [place], PA-C   fluticasone (FLONASE) 50 MCG/ACT nasal spray Place 1 spray into both nostrils daily. 16 g Hall-Potvin, [place], PA-C     PDMP not reviewed this  encounter.   Hall-Potvin, [place], [place] 04/16/20 1326

## 2020-04-16 NOTE — Discharge Instructions (Addendum)
Start flonase, atrovent nasal spray for nasal congestion/drainage. You can use over the counter nasal saline rinse such as neti pot for nasal congestion. Keep hydrated, your urine should be clear to pale yellow in color. Tylenol/motrin for fever and pain. Monitor for any worsening of symptoms, chest pain, shortness of breath, wheezing, swelling of the throat, go to the emergency department for further evaluation needed.  

## 2020-04-16 NOTE — ED Triage Notes (Addendum)
Pt is here with sinuses that started 2 days ago, pt has taken Sudafed to relieve discomfort. Pt does not want COVID testing she has an appt for free testing at 2:30pm today.

## 2020-04-18 ENCOUNTER — Ambulatory Visit (HOSPITAL_COMMUNITY)
Admission: RE | Admit: 2020-04-18 | Discharge: 2020-04-18 | Disposition: A | Discharge status: Home / Self Care | Payer: 59 | Source: Ambulatory Visit | Attending: Family Medicine | Admitting: Family Medicine

## 2020-04-18 ENCOUNTER — Ambulatory Visit: Payer: 59 | Admitting: Family Medicine

## 2020-04-18 ENCOUNTER — Encounter: Payer: Self-pay | Admitting: Family Medicine

## 2020-04-18 ENCOUNTER — Other Ambulatory Visit: Payer: Self-pay

## 2020-04-18 VITALS — BP 108/70 | HR 94 | Ht 59.0 in | Wt 167.4 lb

## 2020-04-18 DIAGNOSIS — Z01810 Encounter for preprocedural cardiovascular examination: Secondary | ICD-10-CM | POA: Insufficient documentation

## 2020-04-18 DIAGNOSIS — Z23 Encounter for immunization: Secondary | ICD-10-CM | POA: Diagnosis not present

## 2020-04-18 DIAGNOSIS — Z419 Encounter for procedure for purposes other than remedying health state, unspecified: Secondary | ICD-10-CM

## 2020-04-18 DIAGNOSIS — K5900 Constipation, unspecified: Secondary | ICD-10-CM

## 2020-04-18 DIAGNOSIS — Z7689 Persons encountering health services in other specified circumstances: Secondary | ICD-10-CM

## 2020-04-18 NOTE — Patient Instructions (Signed)
Please make an appointment for PAP as soon as possible.  I recommend changing your diet and increase exercise to help with BM.  You can also purchase Metamucil to help increase fiber. Try this for about 1 month and if no success we can try other options.  I will complete your forms when results of blood work received.  I will let you know when they can be picked up at the office.  Constipation, Adult Constipation is when a person:  Poops (has a bowel movement) fewer times in a week than normal.  Has a hard time pooping.  Has poop that is dry, hard, or bigger than normal. Follow these instructions at home: Eating and drinking   Eat foods that have a lot of fiber, such as: ? Fresh fruits and vegetables. ? Whole grains. ? Beans.  Eat less of foods that are high in fat, low in fiber, or overly processed, such as: ? [place] fries. ? Hamburgers. ? Cookies. ? Candy. ? Soda.  Drink enough fluid to keep your pee (urine) clear or pale yellow. General instructions  Exercise regularly or as told by your doctor.  Go to the restroom when you feel like you need to poop. Do not hold it in.  Take over-the-counter and prescription medicines only as told by your doctor. These include any fiber supplements.  Do pelvic floor retraining exercises, such as: ? Doing deep breathing while relaxing your lower belly (abdomen). ? Relaxing your pelvic floor while pooping.  Watch your condition for any changes.  Keep all follow-up visits as told by your doctor. This is important. Contact a doctor if:  You have pain that gets worse.  You have a fever.  You have not pooped for 4 days.  You throw up (vomit).  You are not hungry.  You lose weight.  You are bleeding from the anus.  You have thin, pencil-like poop (stool). Get help right away if:  You have a fever, and your symptoms suddenly get worse.  You leak poop or have blood in your poop.  Your belly feels hard or bigger than  normal (is bloated).  You have very bad belly pain.  You feel dizzy or you faint. This information is not intended to replace advice given to you by your health care provider. Make sure you discuss any questions you have with your health care provider. Document Revised: 07/01/2017 Document Reviewed: 01/07/2016 Elsevier Patient Education  2020 [employer].

## 2020-04-19 LAB — COMPREHENSIVE METABOLIC PANEL
ALT: 26 IU/L (ref 0–32)
AST: 16 IU/L (ref 0–40)
Albumin/Globulin Ratio: 1.6 (ref 1.2–2.2)
Albumin: 4.4 g/dL (ref 3.9–5.0)
Alkaline Phosphatase: 69 IU/L (ref 44–121)
BUN/Creatinine Ratio: 10 (ref 9–23)
BUN: 8 mg/dL (ref 6–20)
Bilirubin Total: <0.2 mg/dL (ref 0.0–1.2)
CO2: 20 mmol/L (ref 20–29)
Calcium: 9.3 mg/dL (ref 8.7–10.2)
Chloride: 106 mmol/L (ref 96–106)
Creatinine, Ser: 0.77 mg/dL (ref 0.57–1.00)
GFR calc Af Amer: 122 mL/min/{1.73_m2} (ref >59)
GFR calc non Af Amer: 106 mL/min/{1.73_m2} (ref >59)
Globulin, Total: 2.8 g/dL (ref 1.5–4.5)
Glucose: 82 mg/dL (ref 65–99)
Potassium: 4 mmol/L (ref 3.5–5.2)
Sodium: 139 mmol/L (ref 134–144)
Total Protein: 7.2 g/dL (ref 6.0–8.5)

## 2020-04-19 LAB — CBC WITH DIFFERENTIAL/PLATELET
Basophils Absolute: 0 10*3/uL (ref 0.0–0.2)
Basos: 0 %
EOS (ABSOLUTE): 0.1 10*3/uL (ref 0.0–0.4)
Eos: 2 %
Hematocrit: 39.5 % (ref 34.0–46.6)
Hemoglobin: 13.2 g/dL (ref 11.1–15.9)
Immature Grans (Abs): 0 10*3/uL (ref 0.0–0.1)
Immature Granulocytes: 0 %
Lymphocytes Absolute: 2.9 10*3/uL (ref 0.7–3.1)
Lymphs: 49 %
MCH: 30.8 pg (ref 26.6–33.0)
MCHC: 33.4 g/dL (ref 31.5–35.7)
MCV: 92 fL (ref 79–97)
Monocytes Absolute: 0.5 10*3/uL (ref 0.1–0.9)
Monocytes: 9 %
Neutrophils Absolute: 2.4 10*3/uL (ref 1.4–7.0)
Neutrophils: 40 %
Platelets: 371 10*3/uL (ref 150–450)
RBC: 4.29 x10E6/uL (ref 3.77–5.28)
RDW: 11.7 % (ref 11.7–15.4)
WBC: 6 10*3/uL (ref 3.4–10.8)

## 2020-04-19 LAB — TSH: TSH: 0.712 u[IU]/mL (ref 0.450–4.500)

## 2020-04-19 LAB — HIV ANTIBODY (ROUTINE TESTING W REFLEX): HIV Screen 4th Generation wRfx: NONREACTIVE

## 2020-04-19 LAB — HEMOGLOBIN A1C
Est. average glucose Bld gHb Est-mCnc: 114 mg/dL
Hgb A1c MFr Bld: 5.6 % (ref 4.8–5.6)

## 2020-04-19 LAB — NOVEL CORONAVIRUS, NAA: SARS-CoV-2, NAA: NOT DETECTED

## 2020-04-19 LAB — APTT: aPTT: 29 s (ref 24–33)

## 2020-04-19 LAB — PROTIME-INR
INR: 0.9 (ref 0.9–1.2)
Prothrombin Time: 9.9 s (ref 9.1–12.0)

## 2020-04-21 ENCOUNTER — Encounter: Payer: Self-pay | Admitting: Family Medicine

## 2020-04-21 DIAGNOSIS — Z7689 Persons encountering health services in other specified circumstances: Secondary | ICD-10-CM | POA: Insufficient documentation

## 2020-04-21 DIAGNOSIS — Z419 Encounter for procedure for purposes other than remedying health state, unspecified: Secondary | ICD-10-CM | POA: Insufficient documentation

## 2020-04-21 DIAGNOSIS — K5900 Constipation, unspecified: Secondary | ICD-10-CM | POA: Insufficient documentation

## 2020-04-21 NOTE — Assessment & Plan Note (Signed)
Physical completed and normal -ECG show SR without ST elevation -CMP,CBC with diff, PT/a-PTT/INR, HIV, HCG qual, HbA1c, TSH -Will complete forms when labs resulted and call patient for pick up

## 2020-04-21 NOTE — Assessment & Plan Note (Signed)
Discussed healthy eating habits and increase in exercise Can add Metamucil daily to increase fiber -If symptoms continue can consider Miralax -Follow up with PCP as needed

## 2020-04-21 NOTE — Assessment & Plan Note (Addendum)
Benign exam. -Needs repeat PAP, scheduled for 10/15 -Follow up with PCP as needed

## 2020-04-21 NOTE — Progress Notes (Signed)
SUBJECTIVE:   CHIEF COMPLAINT / HPI: to establish care and need physical for elective surgery  Patient planning to have elective surgery in [place] for Liposuction sometime in October.  She needs blood work and physical prior to surgery.  Denies any headaches, visual disturbances,chest pain,SOB or abdominal pain.  Denies any history or family history of reaction to anesthetics or complicated surgeries.  Constipation Reports BM twice weekly, hard stool and has noticed blood on toilet paper after wiping but no blood in stool.  Diet consists of mostly of fast food and does not exercise.  Denies any abdominal pain, fevers, nausea or vomiting.    Health Maintenance Last PAP 2020 LSIL,  Colposcopy 05/2020 negative and recommendation for repeat PAP at 6 months.   Depo injection due 10/08    Past Medical History: Past Medical History:  Diagnosis Date  . Medical history non-contributory     Past Surgical History: Past Surgical History:  Procedure Laterality Date  . EXCISION MASS LOWER EXTREMETIES Left 12/11/2019   Procedure: EXCISION LEFT THIGH CYST;  Surgeon: Harriette Bouillon, MD;  Location: [facility];  Service: General;  Laterality: Left;  . NO PAST SURGERIES      Obstetrical History: OB History    Gravida  1   Para  0   Term      Preterm      AB  1   Living        SAB      TAB  1   Ectopic      Multiple      Live Births           Obstetric Comments  1st Menstrual Cycle:  14        Social History: Social History   Socioeconomic History  . Marital status: Single    Spouse name: Not on file  . Number of children: Not on file  . Years of education: Not on file  . Highest education level: Not on file  Occupational History  . Not on file  Tobacco Use  . Smoking status: Current Some Day Smoker    Types: Cigarettes  . Smokeless tobacco: Never Used  . Tobacco comment: Hooka 2x monthly  Vaping Use  . Vaping Use: Never used  Substance  and Sexual Activity  . Alcohol use: Yes    Alcohol/week: 2.0 standard drinks    Types: 2 Standard drinks or equivalent per week  . Drug use: No  . Sexual activity: Yes    Birth control/protection: Injection  Other Topics Concern  . Not on file  Social History Narrative  . Not on file   Social Determinants of Health   Financial Resource Strain:   . Difficulty of Paying Living Expenses: Not on file  Food Insecurity:   . Worried About Programme researcher, broadcasting/film/video in the Last Year: Not on file  . Ran Out of Food in the Last Year: Not on file  Transportation Needs:   . Lack of Transportation (Medical): Not on file  . Lack of Transportation (Non-Medical): Not on file  Physical Activity:   . Days of Exercise per Week: Not on file  . Minutes of Exercise per Session: Not on file  Stress:   . Feeling of Stress : Not on file  Social Connections:   . Frequency of Communication with Friends and Family: Not on file  . Frequency of Social Gatherings with Friends and Family: Not on file  . Attends Religious  Services: Not on file  . Active Member of Clubs or Organizations: Not on file  . Attends Banker Meetings: Not on file  . Marital Status: Not on file    Family History: Family History  Problem Relation Age of Onset  . Healthy Mother   . Healthy Father   . Breast cancer Maternal Grandmother   . Hypertension Maternal Grandmother   . Pancreatic cancer Maternal Grandfather   . Breast cancer Other        Maternal aunt    Allergies: No Known Allergies  (Not in a hospital admission)    Review of Systems   All systems reviewed and negative except as stated in HPI    OBJECTIVE:   BP 108/70   Pulse 94   Ht 4\' 11"  (1.499 m)   Wt 167 lb 6.4 oz (75.9 kg)   SpO2 99%   BMI 33.81 kg/m    General: Alert and oriented, no apparent distress  Eyes: PEERLA ENTM: No pharyengeal erythema Neck: nontender Cardiovascular: RRR with no murmurs noted Respiratory: CTA  bilaterally  Gastrointestinal: Bowel sounds present. No abdominal pain MSK: Upper extremity strength 5/5 bilaterally, Lower extremity strength 5/5 bilaterally  Derm: No rashes noted Psych: Behavior and speech appropriate to situation  ASSESSMENT/PLAN:   Encounter to establish care Benign exam. -Needs repeat PAP, scheduled for 10/15 -Follow up with PCP as needed  Elective surgery Physical completed and normal -ECG show SR without ST elevation -CMP,CBC with diff, PT/a-PTT/INR, HIV, HCG qual, HbA1c, TSH -Will complete forms when labs resulted and call patient for pick up   Constipation Discussed healthy eating habits and increase in exercise Can add Metamucil daily to increase fiber -If symptoms continue can consider Miralax -Follow up with PCP as needed     11/15, MD [facility]

## 2020-04-23 LAB — SPECIMEN STATUS REPORT

## 2020-04-23 LAB — HCG, SERUM, QUALITATIVE: hCG,Beta Subunit,Qual,Serum: NEGATIVE m[IU]/mL

## 2020-04-25 ENCOUNTER — Encounter: Payer: Self-pay | Admitting: Family Medicine

## 2020-05-01 ENCOUNTER — Ambulatory Visit (INDEPENDENT_AMBULATORY_CARE_PROVIDER_SITE_OTHER): Payer: 59 | Admitting: Family Medicine

## 2020-05-01 ENCOUNTER — Encounter: Payer: Self-pay | Admitting: Family Medicine

## 2020-05-01 ENCOUNTER — Other Ambulatory Visit: Payer: Self-pay

## 2020-05-01 VITALS — BP 106/60 | HR 86 | Wt 166.2 lb

## 2020-05-01 DIAGNOSIS — R1031 Right lower quadrant pain: Secondary | ICD-10-CM

## 2020-05-01 DIAGNOSIS — K5900 Constipation, unspecified: Secondary | ICD-10-CM | POA: Diagnosis not present

## 2020-05-01 LAB — POCT URINE PREGNANCY: Preg Test, Ur: NEGATIVE

## 2020-05-01 MED ORDER — DICYCLOMINE HCL 10 MG PO CAPS: 10.0000 mg | ORAL_CAPSULE | Freq: Three times a day (TID) | ORAL | 1 refills | Status: DC

## 2020-05-01 MED ORDER — POLYETHYLENE GLYCOL 3350 17 GM/SCOOP PO POWD: 17.0000 g | Freq: Every day | ORAL | 0 refills | Status: DC

## 2020-05-01 NOTE — Assessment & Plan Note (Addendum)
Patient reports stooling twice weekly. Reports some relief of her abdominal pain with stooling. -Ordered MiraLAX to be taken once daily, goal to produce 1 soft stool daily -Can also take Bentyl for abdominal pain, cramping 4 times daily as needed

## 2020-05-01 NOTE — Patient Instructions (Signed)
Thank you for coming in to see Sabrina Brennan today! Please see below to review our plan for today's visit:  1. We are collecting a urinalysis to rule out urinary tract infection and doing urine preg test as required before the CT scan. 2. We will hopefully get you in for CT abdomen/pelvis as soon as possible to rule out appendicitis. 3. I am sending Miralax and Bentyl to your pharmacy - DO NOT pick these medicines up or take them until AFTER you hear from Sabrina Brennan about the results of your CT scan!  Please call the clinic at [phone] if your symptoms worsen or you have any concerns. It was our pleasure to serve you!   Dr. Peggyann Shoals [facility]

## 2020-05-01 NOTE — Assessment & Plan Note (Addendum)
Unclear etiology. Patient reporting sudden onset acute abdominal pain that started 2 days ago. Could consider constipation as possible cause of patient's pain, consider undiagnosed IBS-C. No dysuria or urinary frequency to suggest urinary tract infection. Urinalysis ordered. Pregnancy test ordered today is negative. Could also consider endometriosis as cause of patient's abdominal/pelvic pain, patient is on Depo shot. As patient with significant tenderness to right lower quadrant abdomen and tenderness to right lower quadrant with palpation to left lower quadrant, strong suspicion at this time for appendicitis. 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 -Urine pregnancy test ordered today, negative. -Urinalysis ordered and collected today, will review and treat as needed -CT abdomen/pelvis ordered to rule out appendicitis -Miralax and Bentyl sent to pharmacy -patient instructed DO NOT pick these medicines up or take them until AFTER she hears from [place] about the results of CT scan

## 2020-05-01 NOTE — Progress Notes (Signed)
SUBJECTIVE:   CHIEF COMPLAINT / HPI:   Sharp abdominal pain: Patient is a pleasant, otherwise healthy 29 year old female presenting with report of 2 days of sharp abdominal pain. She reports it has low towards her pelvic region below and around the umbilicus. She reports that the pain occurs in waves that recur every 10 minutes. Each "wave" lasts about 3 minutes each. This will go on repeatedly for about 1 hour. She reports that 2 nights ago she woke up tossing and turning with the lower abdominal pain. It was also bad during the day. She was able to sleep some last night, was was awoken with sharp abdominal pain around 3:00 in the morning. She also reports some bloating. She reports that at work she had to unbuckle her pants due to pain and pressure in her lower abdomen. She reports that she only has a bowel movement about twice weekly, but this has been normal for her for a very long time. She reports having a bowel movement yesterday. She reports having some vaginal bleeding on Tuesday with some spotting after that. She reports having some nausea, denies vomiting, fevers.  She denies any pain with urination, denies any changes in vaginal discharge. She is a sexually active female with one partner. She is on Depo shot for birth control, last Depo shot 04/18/2020. Urine pregnancy test in the office today is negative.  PERTINENT  PMH / PSH:  Patient Active Problem List   Diagnosis Date Noted  . Right lower quadrant abdominal pain 05/01/2020  . Elective surgery 04/21/2020  . Constipation 04/21/2020  . 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 Breast mass in female 08/29/2013     OBJECTIVE:   BP 106/60   Pulse 86   Wt 166 lb 4 oz (75.4 kg)   SpO2 99%   BMI 33.58 kg/m    Physical exam: General: Well-appearing patient, nontoxic-appearing Respiratory: Complete sentences, normal work of breathing Cardio: RRR, S1-S2 present, no  murmurs Abdomen: Normal in appearance, normal bowel sounds appreciated, mild stool burden appreciated to palpation, significant pain in right lower quadrant with palpation to right lower quadrant. Significant pain in right lower quadrant elicited with palpation to left lower quadrant. Rebound sign negative.   ASSESSMENT/PLAN:   Constipation Patient reports stooling twice weekly. Reports some relief of her abdominal pain with stooling. -Ordered MiraLAX to be taken once daily, goal to produce 1 soft stool daily -Can also take Bentyl for abdominal pain, cramping 4 times daily as needed  Right lower quadrant abdominal pain Unclear etiology. Patient reporting sudden onset acute abdominal pain that started 2 days ago. Could consider constipation as possible cause of patient's pain, consider undiagnosed IBS-C. No dysuria or urinary frequency to suggest urinary tract infection. Urinalysis ordered. Pregnancy test ordered today is negative. Could also consider endometriosis as cause of patient's abdominal/pelvic pain, patient is on Depo shot. As patient with significant tenderness to right lower quadrant abdomen and tenderness to right lower quadrant with palpation to left lower quadrant, strong suspicion at this time for appendicitis. 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 -Urine pregnancy test ordered today, negative. -Urinalysis ordered and collected today, will review and treat as needed -CT abdomen/pelvis ordered to rule out appendicitis -Miralax and Bentyl sent to pharmacy -patient instructed DO NOT pick these medicines up or take them until  AFTER she hears from [place] about the results of CT scan     Dollene Cleveland, DO [facility]

## 2020-05-02 ENCOUNTER — Encounter: Payer: Self-pay | Admitting: Family Medicine

## 2020-05-02 LAB — URINALYSIS, ROUTINE W REFLEX MICROSCOPIC
Bilirubin, UA: NEGATIVE
Glucose, UA: NEGATIVE
Ketones, UA: NEGATIVE
Leukocytes,UA: NEGATIVE
Nitrite, UA: NEGATIVE
Protein,UA: NEGATIVE
Specific Gravity, UA: 1.021 (ref 1.005–1.030)
Urobilinogen, Ur: 0.2 mg/dL (ref 0.2–1.0)
pH, UA: 6 (ref 5.0–7.5)

## 2020-05-02 LAB — CBC WITH DIFFERENTIAL
Basophils Absolute: 0 10*3/uL (ref 0.0–0.2)
Basos: 0 %
EOS (ABSOLUTE): 0.1 10*3/uL (ref 0.0–0.4)
Eos: 1 %
Hematocrit: 38 % (ref 34.0–46.6)
Hemoglobin: 12.8 g/dL (ref 11.1–15.9)
Immature Grans (Abs): 0 10*3/uL (ref 0.0–0.1)
Immature Granulocytes: 0 %
Lymphocytes Absolute: 2.6 10*3/uL (ref 0.7–3.1)
Lymphs: 37 %
MCH: 31.1 pg (ref 26.6–33.0)
MCHC: 33.7 g/dL (ref 31.5–35.7)
MCV: 92 fL (ref 79–97)
Monocytes Absolute: 0.5 10*3/uL (ref 0.1–0.9)
Monocytes: 7 %
Neutrophils Absolute: 4 10*3/uL (ref 1.4–7.0)
Neutrophils: 55 %
RBC: 4.12 x10E6/uL (ref 3.77–5.28)
RDW: 11.7 % (ref 11.7–15.4)
WBC: 7.2 10*3/uL (ref 3.4–10.8)

## 2020-05-02 LAB — MICROSCOPIC EXAMINATION: Casts: NONE SEEN /lpf

## 2020-05-08 ENCOUNTER — Ambulatory Visit: Payer: 59

## 2020-05-12 ENCOUNTER — Other Ambulatory Visit: Payer: Self-pay | Admitting: Family Medicine

## 2020-05-15 ENCOUNTER — Ambulatory Visit
Admission: RE | Admit: 2020-05-15 | Discharge: 2020-05-15 | Disposition: A | Discharge status: Home / Self Care | Payer: 59 | Source: Ambulatory Visit | Attending: Family Medicine | Admitting: Family Medicine

## 2020-05-15 ENCOUNTER — Other Ambulatory Visit: Payer: Self-pay

## 2020-05-15 DIAGNOSIS — R1031 Right lower quadrant pain: Secondary | ICD-10-CM

## 2020-05-15 MED ORDER — IOPAMIDOL (ISOVUE-300) INJECTION 61%
100.0000 mL | Freq: Once | INTRAVENOUS | Status: AC | PRN
  Administered 2020-05-15: 100 mL via INTRAVENOUS

## 2020-05-16 ENCOUNTER — Ambulatory Visit: Payer: 59 | Admitting: Family Medicine

## 2020-05-18 ENCOUNTER — Encounter: Payer: Self-pay | Admitting: Family Medicine

## 2020-05-20 ENCOUNTER — Ambulatory Visit: Payer: 59 | Admitting: Family Medicine

## 2020-05-20 ENCOUNTER — Encounter: Payer: Self-pay | Admitting: Family Medicine

## 2020-05-20 ENCOUNTER — Other Ambulatory Visit: Payer: Self-pay

## 2020-05-20 ENCOUNTER — Other Ambulatory Visit (HOSPITAL_COMMUNITY)
Admission: RE | Admit: 2020-05-20 | Discharge: 2020-05-20 | Disposition: A | Discharge status: Home / Self Care | Payer: 59 | Source: Ambulatory Visit | Attending: Family Medicine | Admitting: Family Medicine

## 2020-05-20 VITALS — BP 118/70 | HR 76 | Ht 59.0 in | Wt 167.0 lb

## 2020-05-20 DIAGNOSIS — R5383 Other fatigue: Secondary | ICD-10-CM

## 2020-05-20 DIAGNOSIS — N898 Other specified noninflammatory disorders of vagina: Secondary | ICD-10-CM | POA: Diagnosis not present

## 2020-05-20 DIAGNOSIS — Z124 Encounter for screening for malignant neoplasm of cervix: Secondary | ICD-10-CM | POA: Diagnosis not present

## 2020-05-20 DIAGNOSIS — R87613 High grade squamous intraepithelial lesion on cytologic smear of cervix (HGSIL): Secondary | ICD-10-CM | POA: Diagnosis not present

## 2020-05-20 DIAGNOSIS — Z30013 Encounter for initial prescription of injectable contraceptive: Secondary | ICD-10-CM | POA: Diagnosis not present

## 2020-05-20 LAB — POCT WET PREP (WET MOUNT)
Clue Cells Wet Prep Whiff POC: POSITIVE
Trichomonas Wet Prep HPF POC: ABSENT

## 2020-05-20 LAB — POCT URINE PREGNANCY: Preg Test, Ur: NEGATIVE

## 2020-05-20 MED ORDER — METRONIDAZOLE 500 MG PO TABS
500.0000 mg | ORAL_TABLET | Freq: Two times a day (BID) | ORAL | 0 refills | Status: AC
Start: 2020-05-20 — End: 2020-05-27

## 2020-05-20 MED ORDER — MEDROXYPROGESTERONE ACETATE 150 MG/ML IM SUSY
150.0000 mg | PREFILLED_SYRINGE | INTRAMUSCULAR | Status: AC
  Administered 2020-05-20 – 2021-05-06 (×3): 150 mg via INTRAMUSCULAR

## 2020-05-20 NOTE — Progress Notes (Signed)
    SUBJECTIVE:   CHIEF COMPLAINT / HPI:  PAP/Depo: Here for Depo shot. Her last shot was in July. She is unaware that she will get PAP done today even though that was what the appointment was for. No GU concerns.   Fatigue: C/O excessive tiredness in the past few months despite adequate sleep. 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 No other concerns.    PERTINENT  PMH / PSH: PMX reviewed.  OBJECTIVE:   BP 118/70   Pulse 76   Ht 4\' 11"  (1.499 m)   Wt 167 lb (75.8 kg)   SpO2 99%   BMI 33.73 kg/m   Physical Exam Vitals and nursing note reviewed. Exam conducted with a chaperone present (Jazmin Hartsell).  Cardiovascular:     Rate and Rhythm: Normal rate and regular rhythm.     Heart sounds: Normal heart sounds. No murmur heard.   Pulmonary:     Effort: Pulmonary effort is normal. No respiratory distress.     Breath sounds: Normal breath sounds. No wheezing or rhonchi.  Abdominal:     General: Abdomen is flat. Bowel sounds are normal. There is no distension.     Palpations: Abdomen is soft. There is no mass.     Tenderness: There is no abdominal tenderness.  Genitourinary:    Pubic Area: No rash.      Vagina: Normal.     Cervix: Normal.     Comments: ++ Milky vaginal discharge on exam. Musculoskeletal:     Left lower leg: No edema.      ASSESSMENT/PLAN:  Gyn exam/Abnormal PAP: Upon record review, she had hx of HSIL/CIN III S/P LEEP procedure. She was advise to repeat PAP in a year which will be about now. I gave her the option to get PAP done today v/s f/u with her PCP. She opted to get it done today. STD screening also offered and was completed with her PAP.  Vaginal discharge: Wet prep + for BV. I called her and discussed her result after visit. Metrogel escribed.  Contraceptive management: Upreg negative today. Depo shot given.  Fatigue: Etiology unclear. ?? Stress related. She had a recent CBC  and TSH which were normal. Graded exercise discussed. F/U with PCP soon for further eval.  , MD [facility]

## 2020-05-20 NOTE — Patient Instructions (Signed)

## 2020-05-26 ENCOUNTER — Telehealth: Payer: Self-pay | Admitting: Family Medicine

## 2020-05-26 LAB — CYTOLOGY - PAP
Chlamydia: NEGATIVE
Comment: NEGATIVE
Comment: NEGATIVE
Comment: NEGATIVE
Comment: NEGATIVE
Comment: NORMAL
Diagnosis: NEGATIVE
HPV 16: NEGATIVE
HPV 18 / 45: NEGATIVE
High risk HPV: POSITIVE — AB
Neisseria Gonorrhea: NEGATIVE
Trichomonas: NEGATIVE

## 2020-05-26 NOTE — Telephone Encounter (Signed)
PAP result discussed.  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  However, given previous LEEP for HSIL, recommendation is to get colposcopy done.  I discussed with the patient and colpo appointment was scheduled for her.

## 2020-05-27 ENCOUNTER — Other Ambulatory Visit: Payer: 59

## 2020-05-29 ENCOUNTER — Ambulatory Visit: Payer: 59 | Admitting: Family Medicine

## 2020-05-29 ENCOUNTER — Other Ambulatory Visit: Payer: Self-pay

## 2020-05-29 DIAGNOSIS — N72 Inflammatory disease of cervix uteri: Secondary | ICD-10-CM

## 2020-05-29 DIAGNOSIS — D069 Carcinoma in situ of cervix, unspecified: Secondary | ICD-10-CM

## 2020-05-29 DIAGNOSIS — B977 Papillomavirus as the cause of diseases classified elsewhere: Secondary | ICD-10-CM | POA: Diagnosis not present

## 2020-05-29 NOTE — Progress Notes (Signed)
Hx of LEEP for HGSIL. Follow up pap this year was NILM with + hi risk HPV but negative for 16/18/45 Patient given informed consent, signed copy in the chart.  Placed in lithotomy position. Cervix viewed with speculum and colposcope after application of acetic acid.   Colposcopy adequate (entire squamocolumnar junctions seen  in entirety) ?  Patient is s/p LEEP and the entire area was seen well Acetowhite lesions? no Punctation? no Mosaicism?  no Abnormal vasculature?  no Biopsies? no ECC? no Complications? none  COMMENTS:  Patient was given post procedure instructions.   We discussed her pap history and our subsequent treatment plan with her.  PLAN: Given clinically normal colposcopy today,  But hx of LEEP for HGSIL and now + hi risk HPV but not 16/18/45, I recommend pap with cotest and subtyping 16/18/45 next year.

## 2020-05-29 NOTE — Patient Instructions (Addendum)
Your colposcopy was clinically normal. Please follow up with [place] or your PCP for a pap in one year.  Colposcopy, Care After This sheet gives you information about how to care for yourself after your procedure. Your doctor may also give you more specific instructions. If you have problems or questions, contact your doctor. What can I expect after the procedure? If you did not have a tissue sample removed (did not have a biopsy), you may only have some spotting for a few days. You can go back to your normal activities. If you had a tissue sample removed, it is common to have:  Soreness and pain. This may last for a few days.  Light-headedness.  Mild bleeding from your vagina or dark-colored, grainy discharge from your vagina. This may last for a few days. You may need to wear a sanitary pad.  Spotting for at least 48 hours after the procedure. Follow these instructions at home:   Take over-the-counter and prescription medicines only as told by your doctor. Ask your doctor what medicines you can start taking again. This is very important if you take blood-thinning medicine.  Do not drive or use heavy machinery while taking prescription pain medicine.  For 3 days, or as long as your doctor tells you, avoid: ? Douching. ? Using tampons. ? Having sex.  If you use birth control (contraception), keep using it.  Limit activity for the first day after the procedure. Ask your doctor what activities are safe for you.  It is up to you to get the results of your procedure. Ask your doctor when your results will be ready.  Keep all follow-up visits as told by your doctor. This is important. Contact a doctor if:  You get a skin rash. Get help right away if:  You are bleeding a lot from your vagina. It is a lot of bleeding if you are using more than one pad an hour for 2 hours in a row.  You have clumps of blood (blood clots) coming from your vagina.  You have a fever.  You have  chills  You have pain in your lower belly (pelvic area).  You have signs of infection, such as vaginal discharge that is: ? Different than usual. ? Yellow. ? Bad-smelling.  You have very pain or cramps in your lower belly that do not get better with medicine.  You feel light-headed.  You feel dizzy.  You pass out (faint). Summary  If you did not have a tissue sample removed (did not have a biopsy), you may only have some spotting for a few days. You can go back to your normal activities.  If you had a tissue sample removed, it is common to have mild pain and spotting for 48 hours.  For 3 days, or as long as your doctor tells you, avoid douching, using tampons and having sex.  Get help right away if you have bleeding, very bad pain, or signs of infection. This information is not intended to replace advice given to you by your health care provider. Make sure you discuss any questions you have with your health care provider. Document Revised: 07/01/2017 Document Reviewed: 04/07/2016 Elsevier Patient Education  2020 [employer].

## 2020-06-11 ENCOUNTER — Ambulatory Visit: Payer: 59 | Admitting: Family Medicine

## 2020-06-27 ENCOUNTER — Other Ambulatory Visit: Payer: Self-pay

## 2020-06-27 ENCOUNTER — Encounter (HOSPITAL_COMMUNITY): Payer: Self-pay

## 2020-06-27 ENCOUNTER — Ambulatory Visit (HOSPITAL_COMMUNITY)
Admission: EM | Admit: 2020-06-27 | Discharge: 2020-06-27 | Disposition: A | Discharge status: Home / Self Care | Payer: 59 | Attending: Family Medicine | Admitting: Family Medicine

## 2020-06-27 DIAGNOSIS — R112 Nausea with vomiting, unspecified: Secondary | ICD-10-CM | POA: Diagnosis not present

## 2020-06-27 DIAGNOSIS — Z87891 Personal history of nicotine dependence: Secondary | ICD-10-CM | POA: Insufficient documentation

## 2020-06-27 DIAGNOSIS — R197 Diarrhea, unspecified: Secondary | ICD-10-CM | POA: Insufficient documentation

## 2020-06-27 DIAGNOSIS — Z79899 Other long term (current) drug therapy: Secondary | ICD-10-CM | POA: Diagnosis not present

## 2020-06-27 DIAGNOSIS — Z793 Long term (current) use of hormonal contraceptives: Secondary | ICD-10-CM | POA: Diagnosis not present

## 2020-06-27 DIAGNOSIS — Z20822 Contact with and (suspected) exposure to covid-19: Secondary | ICD-10-CM | POA: Diagnosis not present

## 2020-06-27 LAB — SARS CORONAVIRUS 2 (TAT 6-24 HRS): SARS Coronavirus 2: NEGATIVE

## 2020-06-27 MED ORDER — ONDANSETRON 4 MG PO TBDP: 4.0000 mg | ORAL_TABLET | Freq: Three times a day (TID) | ORAL | 0 refills | Status: DC | PRN

## 2020-06-27 NOTE — ED Triage Notes (Signed)
Patient initially states nausea and vomiting since liposuction on the 4th November 21 in [place]. She returned and stated she has been having nausea and vomiting since this Monday. Patient also complains of general lethargy and fatigue. Pt is oax4 and ambulatory.

## 2020-06-27 NOTE — ED Provider Notes (Signed)
[facility]    CSN: [id number] Arrival date & time: 06/27/20  1537      History   Chief Complaint Chief Complaint  Patient presents with  . Emesis    since monday  . Nausea    since monday    HPI Sabrina Brennan is a 29 y.o. female presenting today for evaluation of nausea and vomiting.  Patient reports over the past 4 to 5 days she is felt chills along with associated nausea and vomiting.  She denies significant abdominal pain, but reports her stomach is felt in "knots".  Had an episode of looser stool this morning, but no recurrent diarrhea.  She reports that she had liposuction performed on 11/4 to her abdomen and back in [place].  Overall she felt as if she was healing well, has had some mild soreness, but denies any significant pain.  Has been tolerating oral intake without nausea prior to 4 days ago.  She feels incisions have been healing well. Mild congestion. Denies cough, sore throat. Denies urinary symptoms, pelvic symptoms.    HPI  Past Medical History:  Diagnosis Date  . Medical history non-contributory     Patient Active Problem List   Diagnosis Date Noted  . Right lower quadrant abdominal pain 05/01/2020  . Elective surgery 04/21/2020  . Constipation 04/21/2020  . 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 Breast mass in female 08/29/2013    Past Surgical History:  Procedure Laterality Date  . EXCISION MASS LOWER EXTREMETIES Left 12/11/2019   Procedure: EXCISION LEFT THIGH CYST;  Surgeon: Harriette Bouillon, MD;  Location: [facility];  Service: General;  Laterality: Left;  . NO PAST SURGERIES      OB History    Gravida  1   Para  0   Term      Preterm      AB  1   Living        SAB      TAB  1   Ectopic      Multiple      Live Births           Obstetric Comments  1st Menstrual Cycle:  14         Home Medications    Prior to Admission medications     Medication Sig Start Date End Date Taking? Authorizing Provider  medroxyPROGESTERone (DEPO-PROVERA) 150 MG/ML injection Inject 150 mg into the muscle every 3 (three) months.    [provider]  ondansetron (ZOFRAN ODT) 4 MG disintegrating tablet Take 1-2 tablets (4-8 mg total) by mouth every 8 (eight) hours as needed for nausea or vomiting. 06/27/20   Rhyan Wolters C, PA-C  cetirizine (ZYRTEC) 10 MG tablet Take 1 tablet (10 mg total) by mouth daily. 04/16/20 06/27/20  Hall-Potvin, [place], PA-C  dicyclomine (BENTYL) 10 MG capsule Take 1 capsule (10 mg total) by mouth 4 (four) times daily -  before meals and at bedtime. As needed to reduce abdominal cramping. 05/01/20 06/27/20  Dollene Cleveland, DO  fluticasone (FLONASE) 50 MCG/ACT nasal spray Place 1 spray into both nostrils daily. 04/16/20 06/27/20  Hall-Potvin, [place], PA-C    Family History Family History  Problem Relation Age of Onset  . Healthy Mother   . Healthy Father   . Breast cancer Maternal Grandmother   . Hypertension Maternal Grandmother   . Pancreatic cancer Maternal Grandfather   . Breast cancer Other  Maternal aunt    Social History Social History   Tobacco Use  . Smoking status: Former Smoker    Types: Cigarettes  . Smokeless tobacco: Never Used  . Tobacco comment: Hooka 2x monthly  Vaping Use  . Vaping Use: Never used  Substance Use Topics  . Alcohol use: Yes    Alcohol/week: 2.0 standard drinks    Types: 2 Standard drinks or equivalent per week  . Drug use: No     Allergies   Patient has no known allergies.   Review of Systems Review of Systems  Constitutional: Positive for chills. Negative for activity change, appetite change, fatigue and fever.  HENT: Negative for congestion, ear pain, rhinorrhea, sinus pressure, sore throat and trouble swallowing.   Eyes: Negative for discharge and redness.  Respiratory: Negative for cough, chest tightness and shortness of breath.    Cardiovascular: Negative for chest pain.  Gastrointestinal: Positive for nausea and vomiting. Negative for abdominal pain and diarrhea.  Musculoskeletal: Negative for myalgias.  Skin: Negative for rash.  Neurological: Negative for dizziness, light-headedness and headaches.     Physical Exam Triage Vital Signs ED Triage Vitals  Enc Vitals Group     BP 06/27/20 1601 109/77     Pulse Rate 06/27/20 1601 84     Resp 06/27/20 1601 18     Temp 06/27/20 1601 98.1 F (36.7 C)     Temp Source 06/27/20 1601 Oral     SpO2 06/27/20 1601 100 %     Weight --      Height --      Head Circumference --      Peak Flow --      Pain Score 06/27/20 1604 7     Pain Loc --      Pain Edu? --      Excl. in GC? --    No data found.  Updated Vital Signs BP 109/77 (BP Location: Right Arm)   Pulse 84   Temp 98.1 F (36.7 C) (Oral)   Resp 18   SpO2 100%   Visual Acuity Right Eye Distance:   Left Eye Distance:   Bilateral Distance:    Right Eye Near:   Left Eye Near:    Bilateral Near:     Physical Exam Vitals and nursing note reviewed.  Constitutional:      Appearance: She is well-developed.     Comments: No acute distress  HENT:     Head: Normocephalic and atraumatic.     Nose: Nose normal.     Mouth/Throat:     Comments: Oral mucosa pink and moist, no tonsillar enlargement or exudate. Posterior pharynx patent and nonerythematous, no uvula deviation or swelling. Normal phonation. Eyes:     Conjunctiva/sclera: Conjunctivae normal.  Cardiovascular:     Rate and Rhythm: Normal rate.  Pulmonary:     Effort: Pulmonary effort is normal. No respiratory distress.     Comments: Breathing comfortably at rest, CTABL, no wheezing, rales or other adventitious sounds auscultated Abdominal:     General: There is no distension.     Comments: Soft, nondistended, diffusely tender throughout abdomen, no focal tenderness, more prominent in bilateral lwoer quadrants  Musculoskeletal:         General: Normal range of motion.     Cervical back: Neck supple.  Skin:    General: Skin is warm and dry.     Comments: Multiple incisions to back and abdomen that appear intact and dry without erythema  Neurological:  Mental Status: She is alert and oriented to person, place, and time.      UC Treatments / Results  Labs (all labs ordered are listed, but only abnormal results are displayed) Labs Reviewed  SARS CORONAVIRUS 2 (TAT 6-24 HRS)    EKG   Radiology No results found.  Procedures Procedures (including critical care time)  Medications Ordered in UC Medications - No data to display  Initial Impression / Assessment and Plan / UC Course  I have reviewed the triage vital signs and the nursing notes.  Pertinent labs & imaging results that were available during my care of the patient were reviewed by me and considered in my medical decision making (see chart for details).     Covid test pending, given new onset of nausea vomiting with chills, suspect more likely viral illness, does have generalized tenderness, negative rebound, lower suspicion of abdominal emergency at this time.  Incisions appear well-healing.  Recommending symptomatic and supportive care with Zofran and oral rehydration.  Continue to monitor pain, if symptoms progressing or worsening to follow-up in the emergency room to have further imaging.  Discussed strict return precautions. Patient verbalized understanding and is agreeable with plan.  Final Clinical Impressions(s) / UC Diagnoses   Final diagnoses:  Nausea vomiting and diarrhea     Discharge Instructions     COVID test pending Zofran for nausea Push fluids Slowly increase solids- avoid spicy and greasy Follow up in emergency room if developing increased abdominal pain     ED Prescriptions    Medication Sig Dispense Auth. Provider   ondansetron (ZOFRAN ODT) 4 MG disintegrating tablet Take 1-2 tablets (4-8 mg total) by mouth every  8 (eight) hours as needed for nausea or vomiting. 20 tablet Virgie Chery, Iraan C, PA-C     PDMP not reviewed this encounter.   Lew Dawes, PA-C 06/27/20 1702

## 2020-06-27 NOTE — Discharge Instructions (Signed)
COVID test pending Zofran for nausea Push fluids Slowly increase solids- avoid spicy and greasy Follow up in emergency room if developing increased abdominal pain

## 2020-07-01 ENCOUNTER — Other Ambulatory Visit: Payer: Self-pay

## 2020-07-01 ENCOUNTER — Ambulatory Visit: Payer: 59 | Admitting: Family Medicine

## 2020-07-01 DIAGNOSIS — G43009 Migraine without aura, not intractable, without status migrainosus: Secondary | ICD-10-CM | POA: Diagnosis not present

## 2020-07-01 MED ORDER — SUMATRIPTAN SUCCINATE 25 MG PO TABS: 25.0000 mg | ORAL_TABLET | ORAL | 0 refills | Status: DC | PRN

## 2020-07-01 NOTE — Progress Notes (Signed)
    SUBJECTIVE:   CHIEF COMPLAINT / HPI: Migraines   She reports headaches daily since Nov 4th. She was on hydrocodone and an abx for 7 days after liposuction procedure 06/04/20. She started taking tylenol extra strength. Last week seen in urgent care for GI virus, symptoms have since resolved. HA characterized involving her whole head, +photosensitivity, tries has to stay still, support her head with pillow. This calms down the headache and it resolves when she wakes from her nap.  Tried excedrin migraine which takes the edge off. Denies visual changes   Reports that she drinks 6-8 bottles of water, 16oz per day.   Was previously given fiorcet, had migraines for 2-3 years.  No weakness or numbness of extremities.  Has not been on prophylaxis.   Had prior surgery with no problems with anesthesia.   PERTINENT  PMH / PSH:  Noncontributory  OBJECTIVE:   BP 108/74   Pulse 79   Ht 5' (1.524 m)   Wt 159 lb 6.4 oz (72.3 kg)   SpO2 99%   BMI 31.13 kg/m   General: Female appearing stated age, noticeably moving slowly HEENT: MMM, no oral lesions noted,Neck non-tender without lymphadenopathy Cardio: Normal S1 and S2, no S3 or S4. Rhythm is regular. No murmurs or rubs.  Bilateral radial pulses palpable Pulm: Clear to auscultation bilaterally, no crackles, wheezing, or diminished breath sounds. Normal respiratory effort Abdomen: Patient abdomen wrapped in binder with towels to help with compression, surgical incisions clean and dry with no drainage or edema/induration, notably swollen around abdomen.  Neuro: pt alert and oriented x4, follows commands, PERRLA, EOMI bilaterally, 4/5 strength in bilateral upper and lower extremities due to abdominal tenderness due to postsurgical status, no conjunctival injection   ASSESSMENT/PLAN:   Migraine headache without aura Patient experiencing increased migraine-like headaches accompanied by photosensitivity without nausea and without aura patient  states that this feels like prior migraine headaches but in frequency has increased since having his surgery.  Given that patient has not been on prophylactic nor abortive therapy will prescribe sumatriptan to attempt to avoid these daily headaches Patient given precautions to seek urgent care if headache worsens or does not respond to this medication or if she begins to develop weakness or fever/chills Patient voiced understanding     Ronnald Ramp, MD [facility]

## 2020-07-01 NOTE — Patient Instructions (Signed)
Please report any symptoms that show worsening of your headache including any weakness, numbness in your arms or legs.  Please also let us know if you have any symptoms including vision changes, chest pain or begin to feel dizzy or lightheaded.    Sumatriptan tablets What is this medicine? SUMATRIPTAN (soo ma TRIP tan) is used to treat migraines with or without aura. An aura is a strange feeling or visual disturbance that warns you of an attack. It is not used to prevent migraines. This medicine may be used for other purposes; ask your health care provider or pharmacist if you have questions. COMMON BRAND NAME(S): Imitrex, Migraine Pack What should I tell my health care provider before I take this medicine? They need to know if you have any of these conditions:  cigarette smoker  circulation problems in fingers and toes  diabetes  heart disease  high blood pressure  high cholesterol  history of irregular heartbeat  history of stroke  kidney disease  liver disease  stomach or intestine problems  an unusual or allergic reaction to sumatriptan, other medicines, foods, dyes, or preservatives  pregnant or trying to get pregnant  breast-feeding How should I use this medicine? Take this medicine by mouth with a glass of water. Follow the directions on the prescription label. Do not take it more often than directed. Talk to your pediatrician regarding the use of this medicine in children. Special care may be needed. Overdosage: If you think you have taken too much of this medicine contact a poison control center or emergency room at once. NOTE: This medicine is only for you. Do not share this medicine with others. What if I miss a dose? This does not apply. This medicine is not for regular use. What may interact with this medicine? Do not take this medicine with any of the following medicines:  certain medicines for migraine headache like almotriptan, eletriptan,  frovatriptan, naratriptan, rizatriptan, sumatriptan, zolmitriptan  ergot alkaloids like dihydroergotamine, ergonovine, ergotamine, methylergonovine  MAOIs like Carbex, Eldepryl, Marplan, Nardil, and Parnate This medicine may also interact with the following medications:  certain medicines for depression, anxiety, or psychotic disorders This list may not describe all possible interactions. Give your health care provider a list of all the medicines, herbs, non-prescription drugs, or dietary supplements you use. Also tell them if you smoke, drink alcohol, or use illegal drugs. Some items may interact with your medicine. What should I watch for while using this medicine? Visit your healthcare professional for regular checks on your progress. Tell your healthcare professional if your symptoms do not start to get better or if they get worse. You may get drowsy or dizzy. Do not drive, use machinery, or do anything that needs mental alertness until you know how this medicine affects you. Do not stand up or sit up quickly, especially if you are an older patient. This reduces the risk of dizzy or fainting spells. Alcohol may interfere with the effect of this medicine. Tell your healthcare professional right away if you have any change in your eyesight. If you take migraine medicines for 10 or more days a month, your migraines may get worse. Keep a diary of headache days and medicine use. Contact your healthcare professional if your migraine attacks occur more frequently. What side effects may I notice from receiving this medicine? Side effects that you should report to your doctor or health care professional as soon as possible:  allergic reactions like skin rash, itching or hives, swelling of  the face, lips, or tongue  changes in vision  chest pain or chest tightness  signs and symptoms of a dangerous change in heartbeat or heart rhythm like chest pain; dizziness; fast, irregular heartbeat;  palpitations; feeling faint or lightheaded; falls; breathing problems  signs and symptoms of a stroke like changes in vision; confusion; trouble speaking or understanding; severe headaches; sudden numbness or weakness of the face, arm or leg; trouble walking; dizziness; loss of balance or coordination  signs and symptoms of serotonin syndrome like irritable; confusion; diarrhea; fast or irregular heartbeat; muscle twitching; stiff muscles; trouble walking; sweating; high fever; seizures; chills; vomiting Side effects that usually do not require medical attention (report to your doctor or health care professional if they continue or are bothersome):  diarrhea  dizziness  drowsiness  dry mouth  headache  nausea, vomiting  pain, tingling, numbness in the hands or feet  stomach pain This list may not describe all possible side effects. Call your doctor for medical advice about side effects. You may report side effects to FDA at 1-800-FDA-1088. Where should I keep my medicine? Keep out of the reach of children. Store at room temperature between 2 and 30 degrees C (36 and 86 degrees F). Throw away any unused medicine after the expiration date. NOTE: This sheet is a summary. It may not cover all possible information. If you have questions about this medicine, talk to your doctor, pharmacist, or health care provider.  2020 Elsevier/Gold Standard (2018-01-31 15:05:37)

## 2020-07-03 DIAGNOSIS — G43009 Migraine without aura, not intractable, without status migrainosus: Secondary | ICD-10-CM | POA: Insufficient documentation

## 2020-07-03 NOTE — Assessment & Plan Note (Signed)
Patient experiencing increased migraine-like headaches accompanied by photosensitivity without nausea and without aura patient states that this feels like prior migraine headaches but in frequency has increased since having his surgery.  Given that patient has not been on prophylactic nor abortive therapy will prescribe sumatriptan to attempt to avoid these daily headaches Patient given precautions to seek urgent care if headache worsens or does not respond to this medication or if she begins to develop weakness or fever/chills Patient voiced understanding

## 2020-07-21 ENCOUNTER — Other Ambulatory Visit: Payer: Self-pay

## 2020-07-21 ENCOUNTER — Ambulatory Visit: Payer: 59 | Admitting: Family Medicine

## 2020-07-21 ENCOUNTER — Encounter: Payer: Self-pay | Admitting: Family Medicine

## 2020-07-21 VITALS — BP 108/78 | HR 76 | Wt 160.2 lb

## 2020-07-21 DIAGNOSIS — H9202 Otalgia, left ear: Secondary | ICD-10-CM | POA: Diagnosis not present

## 2020-07-21 MED ORDER — CIPRO HC 0.2-1 % OT SUSP: 3.0000 [drp] | Freq: Two times a day (BID) | OTIC | 0 refills | Status: DC

## 2020-07-21 NOTE — Progress Notes (Signed)
° °  SUBJECTIVE:   CHIEF COMPLAINT / HPI:   Ms. Sabrina Brennan is a 29 yo F who presents for the issue below.   Ear pain Ongoing for 2 weeks. Niece has been sick with cold 3 weeks prior. Mother had a ear infection, COVID negative. Boyfriend had swollen ear after trying ear buds. Eating and drinking fine. Now with throat pain and headache. Denies fever, vision changes, chest pain, SOB, abdominal pain. Endorses tinnitus.   PERTINENT  PMH / PSH: s/p Liposuction 06/05/20  OBJECTIVE:   BP 108/78    Pulse 76    Wt 160 lb 3.2 oz (72.7 kg)    SpO2 98%    BMI 31.29 kg/m   General: Appears well, no acute distress. Age appropriate. HEENT: Normocephalic. Normal oropharynx, no lymphadenopathy. Left pinna tender with ear tug. Left TM appears to be mildly bugling without erythema. No fluid level. Right TM unremarkable. Cardiac: RRR, normal heart sounds, no murmurs Respiratory: CTAB, normal effort Skin: Warm and dry, no rashes noted Neuro: alert and oriented Psych: normal affect  ASSESSMENT/PLAN:   Left ear pain Subacute. Concern for otitis externa, less likely otitis media with physical findings. Consider viral origin. Patient with throat pain, centor criteria <10% chance of strep, swab not obtained during this visit. Will trial abx otic drops with follow up if symptoms fail to resolve.  - ciprofloxacin-hydrocortisone (CIPRO HC) OTIC suspension; Place 3 drops into the left ear 2 (two) times daily.  Dispense: 10 mL; Refill: 0    Sabrina Jumbo, DO [facility]

## 2020-07-21 NOTE — Patient Instructions (Signed)
It was wonderful to see you today.  Today you were seen for ear pain, throat pain, and headache.  I have prescribed some antibiotic ear drops for you. Please let us know if you are not getting better after a week of using the drops.   You can also use ibuprofen over the counter for pain.   Please call the clinic at [phone] if your symptoms worsen or you have any concerns. It was our pleasure to serve you.  Dr. Salvadore Dom

## 2020-07-22 DIAGNOSIS — H9202 Otalgia, left ear: Secondary | ICD-10-CM | POA: Insufficient documentation

## 2020-07-22 NOTE — Assessment & Plan Note (Addendum)
Subacute. Concern for otitis externa, less likely otitis media with physical findings. Consider viral origin. Patient with throat pain, centor criteria <10% chance of strep, swab not obtained during this visit. Will trial abx otic drops with follow up if symptoms fail to resolve.  - ciprofloxacin-hydrocortisone (CIPRO HC) OTIC suspension; Place 3 drops into the left ear 2 (two) times daily.  Dispense: 10 mL; Refill: 0

## 2020-07-25 ENCOUNTER — Ambulatory Visit
Admission: EM | Admit: 2020-07-25 | Discharge: 2020-07-25 | Disposition: A | Discharge status: Home / Self Care | Payer: 59 | Attending: Sports Medicine | Admitting: Sports Medicine

## 2020-07-25 ENCOUNTER — Encounter: Payer: Self-pay | Admitting: Emergency Medicine

## 2020-07-25 ENCOUNTER — Other Ambulatory Visit: Payer: Self-pay

## 2020-07-25 DIAGNOSIS — R519 Headache, unspecified: Secondary | ICD-10-CM | POA: Insufficient documentation

## 2020-07-25 DIAGNOSIS — R059 Cough, unspecified: Secondary | ICD-10-CM | POA: Diagnosis present

## 2020-07-25 DIAGNOSIS — J0110 Acute frontal sinusitis, unspecified: Secondary | ICD-10-CM | POA: Diagnosis not present

## 2020-07-25 DIAGNOSIS — Z20822 Contact with and (suspected) exposure to covid-19: Secondary | ICD-10-CM | POA: Diagnosis not present

## 2020-07-25 DIAGNOSIS — J069 Acute upper respiratory infection, unspecified: Secondary | ICD-10-CM | POA: Diagnosis not present

## 2020-07-25 DIAGNOSIS — F17210 Nicotine dependence, cigarettes, uncomplicated: Secondary | ICD-10-CM | POA: Diagnosis not present

## 2020-07-25 HISTORY — DX: Migraine, unspecified, not intractable, without status migrainosus: G43.909

## 2020-07-25 LAB — RESP PANEL BY RT-PCR (FLU A&B, COVID) ARPGX2
Influenza A by PCR: NEGATIVE
Influenza B by PCR: NEGATIVE
SARS Coronavirus 2 by RT PCR: NEGATIVE

## 2020-07-25 MED ORDER — AMOXICILLIN 500 MG PO CAPS: 500.0000 mg | ORAL_CAPSULE | Freq: Two times a day (BID) | ORAL | 0 refills | Status: DC

## 2020-07-25 NOTE — ED Triage Notes (Addendum)
Patient in today c/o runny nose and cough x 5 days. Patient denies fever. Patient saw PCP on Monday and was not covid tested. Patient treated for possible ear infection with antibiotic ear drops. Patient states her PCP said she didn't have an ear infection at that time, but due to the pain the patient was having, went ahead and treated for ear infection. Patient has had the covid vaccines. Patient states a lot of her family has been sick with URI sxs, but no one has tested positive for covid.

## 2020-07-25 NOTE — ED Provider Notes (Signed)
[facility]    CSN: [id number] Arrival date & time: 07/25/20  1006      History   Chief Complaint Chief Complaint  Patient presents with  . Nasal Congestion  . Cough    HPI Sabrina Brennan is a 29 y.o. female.   Patient is a pleasant 29 year old female who presents for evaluation of the above issues.  Patient reports having congestion sinus pressure rhinorrhea cough headache with facial pressure especially above her left eye.  She denies any sore throat chest pain shortness of breath.  She has had her symptoms for 10-14 days, worse for about 5 days.  She has had multiple sick contacts but none of them have tested positive for Covid.  She denies any history of asthma and she has not been wheezing.  No significant fevers or myalgias.  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  She resides in [place] and sees her primary care physician at [facility] family practice.  Complicating her situation as she has a history of migraine headaches.  She was recently put on a triptan and she says that she may get an MRI of her brain depending on how she does on this medication.  The headache over her left eye is concerning her today but she feels it is more sinus pressure.  No red flag signs or symptoms offered.  She denies any weakness or vision changes.       Past Medical History:  Diagnosis Date  . Medical history non-contributory   . Migraine     Patient Active Problem List   Diagnosis Date Noted  . Left ear pain 07/22/2020  . Migraine headache without aura 07/03/2020  . Right lower quadrant abdominal pain 05/01/2020  . Elective surgery 04/21/2020  . Constipation 04/21/2020  . 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 Breast mass in female 08/29/2013    Past Surgical History:  Procedure Laterality Date  . EXCISION MASS LOWER EXTREMETIES Left  12/11/2019   Procedure: EXCISION LEFT THIGH CYST;  Surgeon: Harriette Bouillon, MD;  Location:  SURGERY CENTER;  Service: General;  Laterality: Left;  . LIPOSUCTION MULTIPLE BODY PARTS      OB History    Gravida  1   Para  0   Term      Preterm      AB  1   Living        SAB      IAB  1   Ectopic      Multiple      Live Births           Obstetric Comments  1st Menstrual Cycle:  14         Home Medications    Prior to Admission medications   Medication Sig Start Date End Date Taking? Authorizing Provider  ciprofloxacin-hydrocortisone (CIPRO HC) OTIC suspension Place 3 drops into the left ear 2 (two) times daily. 07/21/20  Yes Autry-Lott, Randa Evens, DO  medroxyPROGESTERone (DEPO-PROVERA) 150 MG/ML injection Inject 150 mg into the muscle every 3 (three) months.   Yes [provider]  ondansetron (ZOFRAN ODT) 4 MG disintegrating tablet Take 1-2 tablets (4-8 mg total) by mouth every 8 (eight) hours as needed for nausea or vomiting. 06/27/20  Yes Wieters, Hallie C, PA-C  SUMAtriptan (IMITREX) 25 MG tablet Take 1 tablet (25 mg total) by mouth every  2 (two) hours as needed for migraine. May repeat in 2 hours if headache persists or recurs. 07/01/20  Yes Simmons-Robinson, Makiera, MD  amoxicillin (AMOXIL) 500 MG capsule Take 1 capsule (500 mg total) by mouth 2 (two) times daily. 07/25/20   Delton See, MD  cetirizine (ZYRTEC) 10 MG tablet Take 1 tablet (10 mg total) by mouth daily. 04/16/20 06/27/20  Hall-Potvin, [place], PA-C  dicyclomine (BENTYL) 10 MG capsule Take 1 capsule (10 mg total) by mouth 4 (four) times daily -  before meals and at bedtime. As needed to reduce abdominal cramping. 05/01/20 06/27/20  Dollene Cleveland, DO  fluticasone (FLONASE) 50 MCG/ACT nasal spray Place 1 spray into both nostrils daily. 04/16/20 06/27/20  Hall-Potvin, [place], PA-C    Family History Family History  Problem Relation Age of Onset  . Healthy Mother   . Healthy  Father   . Breast cancer Maternal Grandmother   . Hypertension Maternal Grandmother   . Pancreatic cancer Maternal Grandfather   . Breast cancer Other        Maternal aunt    Social History Social History   Tobacco Use  . Smoking status: Light Tobacco Smoker  . Smokeless tobacco: Never Used  . Tobacco comment: Hooka 2x monthly  Vaping Use  . Vaping Use: Never used  Substance Use Topics  . Alcohol use: Yes    Alcohol/week: 2.0 standard drinks    Types: 2 Standard drinks or equivalent per week  . Drug use: No     Allergies   Patient has no known allergies.   Review of Systems Review of Systems  Constitutional: Negative for activity change, appetite change, chills, fatigue and fever.  HENT: Positive for congestion, rhinorrhea, sinus pressure and sinus pain. Negative for ear pain, facial swelling, postnasal drip, sneezing and sore throat.   Respiratory: Positive for cough. Negative for apnea, chest tightness, shortness of breath, wheezing and stridor.   Cardiovascular: Negative for chest pain.  Gastrointestinal: Negative for abdominal pain.  Genitourinary: Negative for dysuria.  Musculoskeletal: Negative for myalgias.  Skin: Negative for color change, pallor, rash and wound.  Neurological: Positive for headaches. Negative for dizziness, tremors, seizures, syncope, facial asymmetry, speech difficulty, weakness, light-headedness and numbness.     Physical Exam Triage Vital Signs ED Triage Vitals  Enc Vitals Group     BP 07/25/20 1119 122/86     Pulse Rate 07/25/20 1119 79     Resp 07/25/20 1119 18     Temp 07/25/20 1119 98.2 F (36.8 C)     Temp Source 07/25/20 1119 Oral     SpO2 07/25/20 1119 100 %     Weight 07/25/20 1120 160 lb (72.6 kg)     Height 07/25/20 1120 4\' 11"  (1.499 m)     Head Circumference --      Peak Flow --      Pain Score 07/25/20 1119 0     Pain Loc --      Pain Edu? --      Excl. in GC? --    No data found.  Updated Vital Signs BP  122/86 (BP Location: Left Arm)   Pulse 79   Temp 98.2 F (36.8 C) (Oral)   Resp 18   Ht 4\' 11"  (1.499 m)   Wt 72.6 kg   SpO2 100%   BMI 32.32 kg/m   Visual Acuity Right Eye Distance:   Left Eye Distance:   Bilateral Distance:    Right Eye Near:   Left  Eye Near:    Bilateral Near:     Physical Exam Vitals and nursing note reviewed.  Constitutional:      General: She is not in acute distress.    Appearance: Normal appearance. She is not ill-appearing, toxic-appearing or diaphoretic.  HENT:     Head: Normocephalic and atraumatic.     Right Ear: Tympanic membrane normal.     Left Ear: Tympanic membrane normal.     Mouth/Throat:     Mouth: Mucous membranes are moist.     Pharynx: No oropharyngeal exudate or posterior oropharyngeal erythema.  Eyes:     Extraocular Movements: Extraocular movements intact.     Pupils: Pupils are equal, round, and reactive to light.  Neck:     Vascular: No carotid bruit.  Cardiovascular:     Rate and Rhythm: Normal rate and regular rhythm.     Pulses: Normal pulses.     Heart sounds: Normal heart sounds. No murmur heard. No friction rub. No gallop.   Pulmonary:     Effort: Pulmonary effort is normal. No respiratory distress.     Breath sounds: Normal breath sounds. No stridor. No wheezing, rhonchi or rales.  Musculoskeletal:     Cervical back: Normal range of motion and neck supple. No rigidity or tenderness.  Lymphadenopathy:     Cervical: Cervical adenopathy present.  Skin:    General: Skin is warm and dry.     Capillary Refill: Capillary refill takes less than 2 seconds.  Neurological:     General: No focal deficit present.     Mental Status: She is alert.      UC Treatments / Results  Labs (all labs ordered are listed, but only abnormal results are displayed) Labs Reviewed  RESP PANEL BY RT-PCR (FLU A&B, COVID) [id number]    EKG   Radiology No results found.  Procedures Procedures (including critical care  time)  Medications Ordered in UC Medications - No data to display  Initial Impression / Assessment and Plan / UC Course  I have reviewed the triage vital signs and the nursing notes.  Pertinent labs & imaging results that were available during my care of the patient were reviewed by me and considered in my medical decision making (see chart for details).  Clinical impression: 29 year old with sinus pressure, rhinorrhea, and facial pressure above her left eye.  She is also had positive sick contacts.  Complicating her situation is she is being followed by her primary care provider for migraines and there is a question of whether or not she would have an MRI done.  She is on a trial of sumatriptan.  Treatment plan: 1.  The findings and treatment plan were discussed in detail with the patient.  Patient was in agreement.  2.  Will get influenza and Covid test here in the office today.  The results were negative for both Covid and influenza.  Given her symptoms and the fact that they have been going on for almost 2 weeks we will treat her for sinusitis with amoxicillin as prescribed. 3.  Patient did have a hard time focusing on her eye exam.  There was some mild nystagmus.  I do not appreciate that on other physical exam notes in the system.  Will defer to her primary care physician as she has follow-up to discuss her migraine headaches and whether or not she needs an MRI of her brain. 4.  Just supportive care for now, over-the-counter meds as needed, Tylenol or Motrin for  fever discomfort.  5 gave her a work note saying she was seen in the office today.  She can return to work on 27 December if her symptoms resolve. 6.  Red flag signs and symptoms discussed in detail and when to seek out immediate medical attention. 7.  Follow-up here as needed.   Final Clinical Impressions(s) / UC Diagnoses   Final diagnoses:  Acute non-recurrent frontal sinusitis  Viral URI with cough  Acute nonintractable  headache, unspecified headache type     Discharge Instructions     Your Covid and influenza test were negative in the office today.  I will treat you for an acute sinusitis given the facial pressure and headache. Please make an appointment and follow-up with your primary care provider.  You may need to get that MRI of your head that you on her discussed at the last visit.  The details of my evaluation are in the chart for her to review when you see her.    ED Prescriptions    Medication Sig Dispense Auth. Provider   amoxicillin (AMOXIL) 500 MG capsule Take 1 capsule (500 mg total) by mouth 2 (two) times daily. 20 capsule Delton SeeBarnes, Aubryn Spinola, MD     PDMP not reviewed this encounter.   Delton SeeBarnes, Arvella Massingale, MD 07/27/20 1731

## 2020-07-25 NOTE — Discharge Instructions (Addendum)
Your Covid and influenza test were negative in the office today.  I will treat you for an acute sinusitis given the facial pressure and headache. Please make an appointment and follow-up with your primary care provider.  You may need to get that MRI of your head that you on her discussed at the last visit.  The details of my evaluation are in the chart for her to review when you see her.

## 2020-07-30 NOTE — Progress Notes (Signed)
    SUBJECTIVE:   CHIEF COMPLAINT / HPI: respiratory infection  Cough/muscle aches/HA/sore throat: patient seen at [facility] in [place] on 12/23 for frontal HA, runny nose, cough. She reported many sick contacts at that time. However, patient was negative for both flu and COVID on 12/23. Patient was treated with amoxicillin for sinusitis at [facility], she will complete course on Sunday, 1/2. She reports still having cough, muscle aches, sore throat, HA for 2 weeks. Patient lives with her mother, who tested positive for Flu on Tuesday, 07/29/20. Patient denies fever, nausea, vomiting, diarrhea, CP, SOB, rash. She also notes that she has had some wheezing and she has a remote history of exercise induced asthma as an adolescent. She has not used an inhaler in many years.   PERTINENT  PMH / PSH: EIA as an adolescent  OBJECTIVE:   BP 100/80   Pulse 89   Ht 4\' 11"  (1.499 m)   Wt 160 lb (72.6 kg)   SpO2 100%   BMI 32.32 kg/m   Nursing note and vitals reviewed GEN: age-appropriate AAW resting comfortably in chair, NAD, WNWD HEENT: NCAT. PERRLA. Sclera without injection or icterus. MMM, clear oropharynx.  Neck: Supple. No LAD Cardiac: Regular rate and rhythm. Normal S1/S2. No murmurs, rubs, or gallops appreciated. 2+ radial pulses. Lungs: Good air movement in all lung fields, mild wheeze on expiration diffusely. No increased WOB, no accessory muscle usage. No rales or rhonchi Neuro: Alert and oriented, at baseline Ext: no edema Psych: Pleasant and appropriate   ASSESSMENT/PLAN:   Acute asthma exacerbation Patient likely had viral URI last week to account for symptoms, but now she has been exposed to confirmed influenza and is having mild wheeze. Likely mildly exacerbated asthma, will treat with prednisone for 5 days and with albuterol. Counseled on how to use inhaler and spacer correctly. - prednisone 50 mg x5d - albuterol inhaler with spacer 2puffs q4h  Exposure to influenza Patient exposed to  influenza in last 48 hours and is at increased risk due to h/o asthma and now presenting with wheeze. Discussed with patient option of using tamiflu, and she wishes to use it. Precepted with Dr. . RTC precautions given, patient to f/u if no improvement. - tamiflu 75 mg BID x5d     Pollie Meyer, MD [facility]

## 2020-07-30 NOTE — Patient Instructions (Addendum)
It was a pleasure to see you today!  1. I'm sorry you are feeling poorly. You likely have had a viral upper respiratory infection and now you also have an exposure to the flu.  2. For the flu: please take tamiflu 75 mg twice a day for 5 days  3. For the wheezing and chest tightness, take prednisone 50 mg for 5 days one pill with breakfast. Also you can use albuterol inhaler with the spacer as instructed (you can find a video on the CDC website for use).  4. If you develop trouble breathing, unable to take any fluids by mouth with severe nausea/vomiting/or diarrhea, have a temperature greater than 100.4*F you can call our office or seek evaluation at you nearest medical center. Follow up if no improvement in 1 week.    Be Well,  Dr. Leary Roca  Influenza, Adult Influenza is also called "the flu." It is an infection in the lungs, nose, and throat (respiratory tract). It is caused by a virus. The flu causes symptoms that are similar to symptoms of a cold. It also causes a high fever and body aches. The flu spreads easily from person to person (is contagious). Getting a flu shot (influenza vaccination) every year is the best way to prevent the flu. What are the causes? This condition is caused by the influenza virus. You can get the virus by:  Breathing in droplets that are in the air from the cough or sneeze of a person who has the virus.  Touching something that has the virus on it (is contaminated) and then touching your mouth, nose, or eyes. What increases the risk? Certain things may make you more likely to get the flu. These include:  Not washing your hands often.  Having close contact with many people during cold and flu season.  Touching your mouth, eyes, or nose without first washing your hands.  Not getting a flu shot every year. You may have a higher risk for the flu, along with serious problems such as a lung infection (pneumonia), if you:  Are older than 65.  Are  pregnant.  Have a weakened disease-fighting system (immune system) because of a disease or taking certain medicines.  Have a long-term (chronic) illness, such as: ? Heart, kidney, or lung disease. ? Diabetes. ? Asthma.  Have a liver disorder.  Are very overweight (morbidly obese).  Have anemia. This is a condition that affects your red blood cells. What are the signs or symptoms? Symptoms usually begin suddenly and last 4-14 days. They may include:  Fever and chills.  Headaches, body aches, or muscle aches.  Sore throat.  Cough.  Runny or stuffy (congested) nose.  Chest discomfort.  Not wanting to eat as much as normal (poor appetite).  Weakness or feeling tired (fatigue).  Dizziness.  Feeling sick to your stomach (nauseous) or throwing up (vomiting). How is this treated? If the flu is found early, you can be treated with medicine that can help reduce how bad the illness is and how long it lasts (antiviral medicine). This may be given by mouth (orally) or through an IV tube. Taking care of yourself at home can help your symptoms get better. Your doctor may suggest:  Taking over-the-counter medicines.  Drinking plenty of fluids. The flu often goes away on its own. If you have very bad symptoms or other problems, you may be treated in a hospital. Follow these instructions at home:     Activity  Rest as needed. Get  plenty of sleep.  Stay home from work or school as told by your doctor. ? Do not leave home until you do not have a fever for 24 hours without taking medicine. ? Leave home only to visit your doctor. Eating and drinking  Take an ORS (oral rehydration solution). This is a drink that is sold at pharmacies and stores.  Drink enough fluid to keep your pee (urine) pale yellow.  Drink clear fluids in small amounts as you are able. Clear fluids include: ? Water. ? Ice chips. ? Fruit juice that has water added (diluted fruit juice). ? Low-calorie  sports drinks.  Eat bland, easy-to-digest foods in small amounts as you are able. These foods include: ? Bananas. ? Applesauce. ? Rice. ? Lean meats. ? Toast. ? Crackers.  Do not eat or drink: ? Fluids that have a lot of sugar or caffeine. ? Alcohol. ? Spicy or fatty foods. General instructions  Take over-the-counter and prescription medicines only as told by your doctor.  Use a cool mist humidifier to add moisture to the air in your home. This can make it easier for you to breathe.  Cover your mouth and nose when you cough or sneeze.  Wash your hands with soap and water often, especially after you cough or sneeze. If you cannot use soap and water, use alcohol-based hand sanitizer.  Keep all follow-up visits as told by your doctor. This is important. How is this prevented?   Get a flu shot every year. You may get the flu shot in late summer, fall, or winter. Ask your doctor when you should get your flu shot.  Avoid contact with people who are sick during fall and winter (cold and flu season). Contact a doctor if:  You get new symptoms.  You have: ? Chest pain. ? Watery poop (diarrhea). ? A fever.  Your cough gets worse.  You start to have more mucus.  You feel sick to your stomach.  You throw up. Get help right away if you:  Have shortness of breath.  Have trouble breathing.  Have skin or nails that turn a bluish color.  Have very bad pain or stiffness in your neck.  Get a sudden headache.  Get sudden pain in your face or ear.  Cannot eat or drink without throwing up. Summary  Influenza ("the flu") is an infection in the lungs, nose, and throat. It is caused by a virus.  Take over-the-counter and prescription medicines only as told by your doctor.  Getting a flu shot every year is the best way to avoid getting the flu. This information is not intended to replace advice given to you by your health care provider. Make sure you discuss any questions  you have with your health care provider. Document Revised: 01/04/2018 Document Reviewed: 01/04/2018 Elsevier Patient Education  2020 [employer].

## 2020-07-31 ENCOUNTER — Other Ambulatory Visit: Payer: Self-pay

## 2020-07-31 ENCOUNTER — Ambulatory Visit (INDEPENDENT_AMBULATORY_CARE_PROVIDER_SITE_OTHER): Payer: 59 | Admitting: Family Medicine

## 2020-07-31 VITALS — BP 100/80 | HR 89 | Ht 59.0 in | Wt 160.0 lb

## 2020-07-31 DIAGNOSIS — J22 Unspecified acute lower respiratory infection: Secondary | ICD-10-CM

## 2020-07-31 DIAGNOSIS — Z20828 Contact with and (suspected) exposure to other viral communicable diseases: Secondary | ICD-10-CM | POA: Diagnosis not present

## 2020-07-31 DIAGNOSIS — J069 Acute upper respiratory infection, unspecified: Secondary | ICD-10-CM | POA: Diagnosis not present

## 2020-07-31 DIAGNOSIS — J4521 Mild intermittent asthma with (acute) exacerbation: Secondary | ICD-10-CM | POA: Diagnosis not present

## 2020-07-31 MED ORDER — PREDNISONE 50 MG PO TABS: ORAL_TABLET | ORAL | 0 refills | Status: DC

## 2020-07-31 MED ORDER — OSELTAMIVIR PHOSPHATE 75 MG PO CAPS: 75.0000 mg | ORAL_CAPSULE | Freq: Two times a day (BID) | ORAL | 0 refills | Status: DC

## 2020-07-31 MED ORDER — ALBUTEROL SULFATE HFA 108 (90 BASE) MCG/ACT IN AERS: 2.0000 | INHALATION_SPRAY | Freq: Four times a day (QID) | RESPIRATORY_TRACT | 0 refills | Status: DC | PRN

## 2020-08-03 DIAGNOSIS — Z20828 Contact with and (suspected) exposure to other viral communicable diseases: Secondary | ICD-10-CM | POA: Insufficient documentation

## 2020-08-03 DIAGNOSIS — J45901 Unspecified asthma with (acute) exacerbation: Secondary | ICD-10-CM | POA: Insufficient documentation

## 2020-08-03 NOTE — Assessment & Plan Note (Signed)
Patient likely had viral URI last week to account for symptoms, but now she has been exposed to confirmed influenza and is having mild wheeze. Likely mildly exacerbated asthma, will treat with prednisone for 5 days and with albuterol. Counseled on how to use inhaler and spacer correctly. - prednisone 50 mg x5d - albuterol inhaler with spacer 2puffs q4h

## 2020-08-03 NOTE — Assessment & Plan Note (Signed)
Patient exposed to influenza in last 48 hours and is at increased risk due to h/o asthma and now presenting with wheeze. Discussed with patient option of using tamiflu, and she wishes to use it. Precepted with Dr. Pollie Meyer. RTC precautions given, patient to f/u if no improvement. - tamiflu 75 mg BID x5d

## 2020-08-14 ENCOUNTER — Other Ambulatory Visit: Payer: Medicaid Other

## 2020-08-14 DIAGNOSIS — U071 COVID-19: Secondary | ICD-10-CM

## 2020-08-16 LAB — NOVEL CORONAVIRUS, NAA: SARS-CoV-2, NAA: NOT DETECTED

## 2020-08-16 LAB — SARS-COV-2, NAA 2 DAY TAT

## 2020-08-25 ENCOUNTER — Other Ambulatory Visit: Payer: Self-pay

## 2020-08-25 ENCOUNTER — Ambulatory Visit (HOSPITAL_COMMUNITY)
Admission: EM | Admit: 2020-08-25 | Discharge: 2020-08-25 | Disposition: A | Discharge status: Home / Self Care | Payer: 59 | Attending: Family Medicine | Admitting: Family Medicine

## 2020-08-25 ENCOUNTER — Encounter (HOSPITAL_COMMUNITY): Payer: Self-pay

## 2020-08-25 DIAGNOSIS — G43819 Other migraine, intractable, without status migrainosus: Secondary | ICD-10-CM | POA: Diagnosis not present

## 2020-08-25 DIAGNOSIS — R0981 Nasal congestion: Secondary | ICD-10-CM | POA: Diagnosis present

## 2020-08-25 DIAGNOSIS — Z20822 Contact with and (suspected) exposure to covid-19: Secondary | ICD-10-CM | POA: Diagnosis present

## 2020-08-25 LAB — SARS CORONAVIRUS 2 (TAT 6-24 HRS): SARS Coronavirus 2: NEGATIVE

## 2020-08-25 MED ORDER — METOCLOPRAMIDE HCL 5 MG/ML IJ SOLN
5.0000 mg | INTRAMUSCULAR | Status: AC
  Administered 2020-08-25: 5 mg via INTRAMUSCULAR

## 2020-08-25 MED ORDER — KETOROLAC TROMETHAMINE 60 MG/2ML IM SOLN
INTRAMUSCULAR | Status: AC
  Filled 2020-08-25: qty 2

## 2020-08-25 MED ORDER — METOCLOPRAMIDE HCL 5 MG/ML IJ SOLN
INTRAMUSCULAR | Status: AC
  Filled 2020-08-25: qty 2

## 2020-08-25 MED ORDER — METHYLPREDNISOLONE SODIUM SUCC 125 MG IJ SOLR
INTRAMUSCULAR | Status: AC
  Filled 2020-08-25: qty 2

## 2020-08-25 MED ORDER — METHYLPREDNISOLONE SODIUM SUCC 125 MG IJ SOLR
125.0000 mg | Freq: Once | INTRAMUSCULAR | Status: AC
  Administered 2020-08-25: 125 mg via INTRAMUSCULAR

## 2020-08-25 MED ORDER — KETOROLAC TROMETHAMINE 60 MG/2ML IM SOLN
60.0000 mg | Freq: Once | INTRAMUSCULAR | Status: AC
  Administered 2020-08-25: 60 mg via INTRAMUSCULAR

## 2020-08-25 MED ORDER — PREDNISONE 20 MG PO TABS: 20.0000 mg | ORAL_TABLET | Freq: Every day | ORAL | 0 refills | Status: AC

## 2020-08-25 MED ORDER — IPRATROPIUM BROMIDE 0.03 % NA SOLN: 2.0000 | Freq: Two times a day (BID) | NASAL | 0 refills | Status: DC

## 2020-08-25 NOTE — ED Triage Notes (Signed)
Patient states she has had sinus pressure and pain mainly on the left side worse than the right side. Pt also complains of headaches also generally feeling like a sinus issue. Pt also complains of migraine type headaches 2-3 times a week. Pt is aox4 and ambulatory.

## 2020-08-25 NOTE — Discharge Instructions (Addendum)
Your COVID 19 results will be available in 24-48 hours. Negative results are immediately resulted to Mychart. Positive results will receive a follow-up call from our clinic. If symptoms are present, I recommend home quarantine until results are known.   Start oral prednisone on tomorrow 08/26/2020.  You are treated today with a headache cocktail and given Toradol, Solu-Medrol which is a on, and Reglan which is a medication to help manage migrainous headaches.  Follow-up with your primary care regarding a referral to neurology. Start cetirizine and Atrovent nasal spray as prescribed to manage nasal congestion symptoms. If your Covid test is positive you will need to quarantine for total of 5 days.  If your Covid test is negative and you are afebrile you may return to work.

## 2020-08-25 NOTE — Progress Notes (Signed)
    SUBJECTIVE:   CHIEF COMPLAINT / HPI:   Headache  Onset: 10 years Location: global Quality: throbbing Frequency: 2-3 times week Precipitating factors: none Prior treatment: Excedrin, Tylenol. Sumatriptan, Fioricet,   Associated Symptoms Nausea/vomiting: no  Photophobia/phonophobia: yes  Tearing of eyes: no  Sinus pain/pressure: no  Family hx migraine: yes  Personal stressors: yes  Relation to menstrual cycle: no   Red Flags Fever: no  Neck pain/stiffness: no  Vision/speech/swallow/hearing difficulty: no  Focal weakness/numbness: yes  Altered mental status: no  Trauma: no  New type of headache: no  Anticoagulant use: no  H/o cancer/HIV/Pregnancy: no   Contraception management Last Depo injection 10/21.     PERTINENT  PMH / PSH:  Migraines without aura  OBJECTIVE:   BP 118/84   Pulse 86   Ht 4\' 11"  (1.499 m)   Wt 160 lb 9.6 oz (72.8 kg)   SpO2 98%   BMI 32.44 kg/m    General: Alert, shading eyes from light but in no acute distress Cardio: Normal S1 and S2, RRR, no r/m/g Pulm: CTAB, normal work of breathing Abdomen: Bowel sounds normal. Abdomen soft and non-tender.  Extremities: No peripheral edema.  Neuro: Cranial nerves grossly intact. Motor,sensory and gait intact. FNT and HTS testng intact. No weakness appreciated in bilateral upper/lower extremities    ASSESSMENT/PLAN:   Migraine headache without aura Likely worsening migraines and uncontrolled. Nero exam show no focal deficits. Increase Imitrex to 50mg , max 2 doses in 24 hrs  Start Propranolol 10 mg BID Refer to neurology at patients request.  Concerned that Migraines are getting worse and she is feeling weaker on left side. Strict return precautions provided Encouraged to limit stress, pay attention to triggers, stay well hydrated Follow up in 14/7     , MD [facility]

## 2020-08-25 NOTE — Patient Instructions (Addendum)
Thank you for coming to see me today. It was a pleasure.   Start you on propranolol 10 mg twice a day.  If you start to feel any dizziness or weakness please stop the medication let me know. I have increased your Imitrex to 50 mg.  You can take this at the onset of a migraine and can repeat 2 hours after x1 extra dose.  I have also sent a referral to neurology.  They will call you with an appointment.  If you do not hear anything within the next couple of weeks please let me know.  I would encourage you to get your Covid booster when you can.  Please book an appointment in nurses clinic to receive this.  Please follow-up with PCP in 2 weeks Feb 7 at 350 pm  If you have any questions or concerns, please do not hesitate to call the office at [phone].  Best,   Dana Allan, MD    Migraine Headache A migraine headache is a very strong throbbing pain on one side or both sides of your head. This type of headache can also cause other symptoms. It can last from 4 hours to 3 days. Talk with your doctor about what things may bring on (trigger) this condition. What are the causes? The exact cause of this condition is not known. This condition may be triggered or caused by:  Drinking alcohol.  Smoking.  Taking medicines, such as: ? Medicine used to treat chest pain (nitroglycerin). ? Birth control pills. ? Estrogen. ? Some blood pressure medicines.  Eating or drinking certain products.  Doing physical activity. Other things that may trigger a migraine headache include:  Having a menstrual period.  Pregnancy.  Hunger.  Stress.  Not getting enough sleep or getting too much sleep.  Weather changes.  Tiredness (fatigue). What increases the risk?  Being 13-16 years old.  Being female.  Having a family history of migraine headaches.  Being Caucasian.  Having depression or anxiety.  Being very overweight. What are the signs or symptoms?  A throbbing pain. This  pain may: ? Happen in any area of the head, such as on one side or both sides. ? Make it hard to do daily activities. ? Get worse with physical activity. ? Get worse around bright lights or loud noises.  Other symptoms may include: ? Feeling sick to your stomach (nauseous). ? Vomiting. ? Dizziness. ? Being sensitive to bright lights, loud noises, or smells.  Before you get a migraine headache, you may get warning signs (an aura). An aura may include: ? Seeing flashing lights or having blind spots. ? Seeing bright spots, halos, or zigzag lines. ? Having tunnel vision or blurred vision. ? Having numbness or a tingling feeling. ? Having trouble talking. ? Having weak muscles.  Some people have symptoms after a migraine headache (postdromal phase), such as: ? Tiredness. ? Trouble thinking (concentrating). How is this treated?  Taking medicines that: ? Relieve pain. ? Relieve the feeling of being sick to your stomach. ? Prevent migraine headaches.  Treatment may also include: ? Having acupuncture. ? Avoiding foods that bring on migraine headaches. ? Learning ways to control your body functions (biofeedback). ? Therapy to help you know and deal with negative thoughts (cognitive behavioral therapy). Follow these instructions at home: Medicines  Take over-the-counter and prescription medicines only as told by your doctor.  Ask your doctor if the medicine prescribed to you: ? Requires you to avoid driving or  using heavy machinery. ? Can cause trouble pooping (constipation). You may need to take these steps to prevent or treat trouble pooping:  Drink enough fluid to keep your pee (urine) pale yellow.  Take over-the-counter or prescription medicines.  Eat foods that are high in fiber. These include beans, whole grains, and fresh fruits and vegetables.  Limit foods that are high in fat and sugar. These include fried or sweet foods. Lifestyle  Do not drink alcohol.  Do not  use any products that contain nicotine or tobacco, such as cigarettes, e-cigarettes, and chewing tobacco. If you need help quitting, ask your doctor.  Get at least 8 hours of sleep every night.  Limit and deal with stress. General instructions  Keep a journal to find out what may bring on your migraine headaches. For example, write down: ? What you eat and drink. ? How much sleep you get. ? Any change in what you eat or drink. ? Any change in your medicines.  If you have a migraine headache: ? Avoid things that make your symptoms worse, such as bright lights. ? It may help to lie down in a dark, quiet room. ? Do not drive or use heavy machinery. ? Ask your doctor what activities are safe for you.  Keep all follow-up visits as told by your doctor. This is important.      Contact a doctor if:  You get a migraine headache that is different or worse than others you have had.  You have more than 15 headache days in one month. Get help right away if:  Your migraine headache gets very bad.  Your migraine headache lasts longer than 72 hours.  You have a fever.  You have a stiff neck.  You have trouble seeing.  Your muscles feel weak or like you cannot control them.  You start to lose your balance a lot.  You start to have trouble walking.  You pass out (faint).  You have a seizure. Summary  A migraine headache is a very strong throbbing pain on one side or both sides of your head. These headaches can also cause other symptoms.  This condition may be treated with medicines and changes to your lifestyle.  Keep a journal to find out what may bring on your migraine headaches.  Contact a doctor if you get a migraine headache that is different or worse than others you have had.  Contact your doctor if you have more than 15 headache days in a month. This information is not intended to replace advice given to you by your health care provider. Make sure you discuss any  questions you have with your health care provider. Document Revised: 11/10/2018 Document Reviewed: 08/31/2018 Elsevier Patient Education  2021 [employer].

## 2020-08-25 NOTE — ED Provider Notes (Signed)
[facility]    CSN: [id number] Arrival date & time: 08/25/20  0855      History   Chief Complaint Chief Complaint  Patient presents with  . Facial Pain    Since friday  . Headache    Since friday    HPI Sabrina Brennan is a 30 y.o. female.   HPI  Patient presents with URI symptoms including left-sided facial pressure, left-sided nares congestion, recurrent migrainous headaches, with recent exposure to COVID-19 on 08/09/2020. COVID Vaccinated: Y.  She has been taking Excedrin migraine for management of headache.  She has been able to produce nasal drainage which has been clear without blood.Denies worrisome symptoms of shortness of breath, weakness, N&V, or chest pain.    Past Medical History:  Diagnosis Date  . Medical history non-contributory   . Migraine     Patient Active Problem List   Diagnosis Date Noted  . Acute asthma exacerbation 08/03/2020  . Exposure to influenza 08/03/2020  . Left ear pain 07/22/2020  . Migraine headache without aura 07/03/2020  . Right lower quadrant abdominal pain 05/01/2020  . Elective surgery 04/21/2020  . Constipation 04/21/2020  . 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 Breast mass in female 08/29/2013    Past Surgical History:  Procedure Laterality Date  . EXCISION MASS LOWER EXTREMETIES Left 12/11/2019   Procedure: EXCISION LEFT THIGH CYST;  Surgeon: Harriette Bouillon, MD;  Location: [facility];  Service: General;  Laterality: Left;  . LIPOSUCTION MULTIPLE BODY PARTS      OB History    Gravida  1   Para  0   Term      Preterm      AB  1   Living        SAB      IAB  1   Ectopic      Multiple      Live Births           Obstetric Comments  1st Menstrual Cycle:  14         Home Medications    Prior to Admission medications   Medication Sig Start Date End Date Taking? Authorizing Provider  albuterol (VENTOLIN HFA) 108  (90 Base) MCG/ACT inhaler Inhale 2 puffs into the lungs every 6 (six) hours as needed for wheezing or shortness of breath. 07/31/20   Shirlean Mylar, MD  amoxicillin (AMOXIL) 500 MG capsule Take 1 capsule (500 mg total) by mouth 2 (two) times daily. 07/25/20   Delton See, MD  ciprofloxacin-hydrocortisone (CIPRO [facility]) OTIC suspension Place 3 drops into the left ear 2 (two) times daily. 07/21/20   Autry-Lott, Randa Evens, DO  medroxyPROGESTERone (DEPO-PROVERA) 150 MG/ML injection Inject 150 mg into the muscle every 3 (three) months.    [provider]  ondansetron (ZOFRAN ODT) 4 MG disintegrating tablet Take 1-2 tablets (4-8 mg total) by mouth every 8 (eight) hours as needed for nausea or vomiting. 06/27/20   Wieters, Hallie C, PA-C  oseltamivir (TAMIFLU) 75 MG capsule Take 1 capsule (75 mg total) by mouth 2 (two) times daily. 07/31/20   Shirlean Mylar, MD  predniSONE (DELTASONE) 50 MG tablet Take one tablet (50 mg) daily by mouth with breakfast for 5 days 07/31/20   Shirlean Mylar, MD  SUMAtriptan (IMITREX) 25 MG tablet Take 1 tablet (25 mg total) by mouth every 2 (two) hours as needed for migraine. May repeat in 2 hours if headache  persists or recurs. 07/01/20   Simmons-Robinson, Tawanna Cooler, MD  cetirizine (ZYRTEC) 10 MG tablet Take 1 tablet (10 mg total) by mouth daily. 04/16/20 06/27/20  Hall-Potvin, [place], PA-C  dicyclomine (BENTYL) 10 MG capsule Take 1 capsule (10 mg total) by mouth 4 (four) times daily -  before meals and at bedtime. As needed to reduce abdominal cramping. 05/01/20 06/27/20  Dollene Cleveland, DO  fluticasone (FLONASE) 50 MCG/ACT nasal spray Place 1 spray into both nostrils daily. 04/16/20 06/27/20  Hall-Potvin, [place], PA-C    Family History Family History  Problem Relation Age of Onset  . Healthy Mother   . Healthy Father   . Breast cancer Maternal Grandmother   . Hypertension Maternal Grandmother   . Pancreatic cancer Maternal Grandfather   . Breast cancer  Other        Maternal aunt    Social History Social History   Tobacco Use  . Smoking status: Light Tobacco Smoker  . Smokeless tobacco: Never Used  . Tobacco comment: Hooka 2x monthly  Vaping Use  . Vaping Use: Never used  Substance Use Topics  . Alcohol use: Yes    Alcohol/week: 2.0 standard drinks    Types: 2 Standard drinks or equivalent per week  . Drug use: No     Allergies   Patient has no known allergies.   Review of Systems Review of Systems Pertinent negatives listed in HPI  Physical Exam Triage Vital Signs ED Triage Vitals  Enc Vitals Group     BP 08/25/20 0953 111/82     Pulse Rate 08/25/20 0953 84     Resp 08/25/20 0953 18     Temp 08/25/20 0953 98.3 F (36.8 C)     Temp Source 08/25/20 0953 Oral     SpO2 08/25/20 0953 100 %     Weight --      Height --      Head Circumference --      Peak Flow --      Pain Score 08/25/20 0955 5     Pain Loc --      Pain Edu? --      Excl. in GC? --    No data found.  Updated Vital Signs BP 111/82 (BP Location: Right Arm)   Pulse 84   Temp 98.3 F (36.8 C) (Oral)   Resp 18   SpO2 100%   Visual Acuity Right Eye Distance:   Left Eye Distance:   Bilateral Distance:    Right Eye Near:   Left Eye Near:    Bilateral Near:     Physical Exam  General Appearance:    Alert, cooperative, no distress  HENT:   Normocephalic, ears normal, nares mucosal edema with congestion present, rhinorrhea, oropharynx clear  Eyes:    PERRL, conjunctiva/corneas discharge bilateral eyes (crusty white) , EOM's intact       Lungs:     Clear to auscultation bilaterally, respirations unlabored  Heart:    Regular rate and rhythm  Neurologic:   Awake, alert, oriented x 3. No apparent focal neurological           defect.      UC Treatments / Results  Labs (all labs ordered are listed, but only abnormal results are displayed) Labs Reviewed  SARS CORONAVIRUS 2 (TAT 6-24 HRS)    EKG   Radiology No results  found.  Procedures Procedures (including critical care time)  Medications Ordered in UC Medications  ketorolac (TORADOL) injection 60 mg (60  mg Intramuscular Given 08/25/20 1042)  methylPREDNISolone sodium succinate (SOLU-MEDROL) 125 mg/2 mL injection 125 mg (125 mg Intramuscular Given 08/25/20 1041)  metoCLOPramide (REGLAN) injection 5 mg (5 mg Intramuscular Given 08/25/20 1041)    Initial Impression / Assessment and Plan / UC Course  I have reviewed the triage vital signs and the nursing notes.  Pertinent labs & imaging results that were available during my care of the patient were reviewed by me and considered in my medical decision making (see chart for details).    COVID test pending.  Patient with acute migrainous type headache treatment in office today with headache cocktail including IM Toradol, IM Reglan, IM Solu-Medrol. Symptom management warranted only.  Manage fever with Tylenol and ibuprofen.  Nasal symptoms with over-the-counter antihistamines recommended.  Treatment per discharge medications/discharge instructions.  Red flags/ER precautions given. The most current CDC isolation/quarantine recommendation advised.  Final Clinical Impressions(s) / UC Diagnoses   Final diagnoses:  Other migraine without status migrainosus, intractable  Sinus congestion  Close exposure to COVID-19 virus     Discharge Instructions     Your COVID 19 results will be available in 24-48 hours. Negative results are immediately resulted to Mychart. Positive results will receive a follow-up call from our clinic. If symptoms are present, I recommend home quarantine until results are known.   Start oral prednisone on tomorrow 08/26/2020.  You are treated today with a headache cocktail and given Toradol, Solu-Medrol which is a on, and Reglan which is a medication to help manage migrainous headaches.  Follow-up with your primary care regarding a referral to neurology. Start cetirizine and Atrovent nasal  spray as prescribed to manage nasal congestion symptoms. If your Covid test is positive you will need to quarantine for total of 5 days.  If your Covid test is negative and you are afebrile you may return to work.     ED Prescriptions    Medication Sig Dispense Auth. Provider   ipratropium (ATROVENT) 0.03 % nasal spray Place 2 sprays into both nostrils 2 (two) times daily. 30 mL Bing Neighbors, FNP   predniSONE (DELTASONE) 20 MG tablet Take 1 tablet (20 mg total) by mouth daily with breakfast for 5 days. 5 tablet Bing Neighbors, FNP     PDMP not reviewed this encounter.   Bing Neighbors, FNP 08/25/20 [phone]

## 2020-08-26 ENCOUNTER — Ambulatory Visit (INDEPENDENT_AMBULATORY_CARE_PROVIDER_SITE_OTHER): Payer: 59 | Admitting: Family Medicine

## 2020-08-26 ENCOUNTER — Other Ambulatory Visit: Payer: Self-pay

## 2020-08-26 ENCOUNTER — Encounter: Payer: Self-pay | Admitting: Family Medicine

## 2020-08-26 VITALS — BP 118/84 | HR 86 | Ht 59.0 in | Wt 160.6 lb

## 2020-08-26 DIAGNOSIS — Z0000 Encounter for general adult medical examination without abnormal findings: Secondary | ICD-10-CM

## 2020-08-26 DIAGNOSIS — G43009 Migraine without aura, not intractable, without status migrainosus: Secondary | ICD-10-CM

## 2020-08-26 DIAGNOSIS — Z3202 Encounter for pregnancy test, result negative: Secondary | ICD-10-CM

## 2020-08-26 DIAGNOSIS — Z3042 Encounter for surveillance of injectable contraceptive: Secondary | ICD-10-CM

## 2020-08-26 DIAGNOSIS — G43109 Migraine with aura, not intractable, without status migrainosus: Secondary | ICD-10-CM

## 2020-08-26 LAB — POCT URINE PREGNANCY: Preg Test, Ur: NEGATIVE

## 2020-08-26 MED ORDER — MEDROXYPROGESTERONE ACETATE 150 MG/ML IM SUSP
150.0000 mg | Freq: Once | INTRAMUSCULAR | Status: AC
  Administered 2020-08-26: 150 mg via INTRAMUSCULAR

## 2020-08-26 MED ORDER — PROPRANOLOL HCL 10 MG PO TABS
10.0000 mg | ORAL_TABLET | Freq: Two times a day (BID) | ORAL | 0 refills | Status: DC
Start: 2020-08-26 — End: 2020-09-11

## 2020-08-26 MED ORDER — SUMATRIPTAN SUCCINATE 50 MG PO TABS: 50.0000 mg | ORAL_TABLET | ORAL | 0 refills | Status: DC | PRN

## 2020-08-27 ENCOUNTER — Encounter: Payer: Self-pay | Admitting: Family Medicine

## 2020-08-27 LAB — HCV INTERPRETATION

## 2020-08-27 LAB — HCV AB W REFLEX TO QUANT PCR: HCV Ab: 0.1 s/co ratio (ref 0.0–0.9)

## 2020-08-27 NOTE — Assessment & Plan Note (Signed)
Likely worsening migraines and uncontrolled. Nero exam show no focal deficits. Increase Imitrex to 50mg , max 2 doses in 24 hrs  Start Propranolol 10 mg BID Refer to neurology at patients request.  Concerned that Migraines are getting worse and she is feeling weaker on left side. Strict return precautions provided Encouraged to limit stress, pay attention to triggers, stay well hydrated Follow up in 14/7

## 2020-08-28 ENCOUNTER — Encounter: Payer: Self-pay | Admitting: Family Medicine

## 2020-08-28 DIAGNOSIS — Z8742 Personal history of other diseases of the female genital tract: Secondary | ICD-10-CM | POA: Insufficient documentation

## 2020-09-04 NOTE — Progress Notes (Deleted)
    SUBJECTIVE:   CHIEF COMPLAINT / HPI:   Presents for follow up for **. Seen in clinic on ** and treated with **.  Since then patient reports improvement in symptoms **. Associated symptoms include **.    PERTINENT  PMH / PSH: ***  OBJECTIVE:   There were no vitals taken for this visit.   General: Alert, no acute distress Cardio: Normal S1 and S2, RRR, no r/m/g Pulm: CTAB, normal work of breathing Abdomen: Bowel sounds normal. Abdomen soft and non-tender.  Extremities: No peripheral edema.  Neuro: Cranial nerves grossly intact   ASSESSMENT/PLAN:   No problem-specific Assessment & Plan notes found for this encounter.     Tiarna Koppen, MD [facility]   

## 2020-09-08 ENCOUNTER — Ambulatory Visit: Payer: Medicaid Other | Admitting: Family Medicine

## 2020-09-08 ENCOUNTER — Other Ambulatory Visit: Payer: Self-pay

## 2020-09-10 NOTE — Progress Notes (Unsigned)
    SUBJECTIVE:   CHIEF COMPLAINT / HPI:   Migraine headaches Most recently seen by PCP on 1/25 for worsening chronic migraine headaches. No red flag symptoms other than reported focal weakness/numbness, though neuro exam was nonfocal. At that visit, sumatriptan was increased to 50 mg (with max 2 doses in 24 hours) and she was started on propranolol 10 mg twice daily. She was also referred to neurology per patient request due to worsening of migraine headaches and feeling weaker on the left side. Initial neurology appointment is scheduled for 3/30. Today, still having severe headaches 3-4 times/week usually lasting several hours affecting primarily the left side, no significant improvement since last visit. Usually has aura of neck pain and left arm numbness/tingling. Having intermittent blurred vision with headaches for the past 4 months. Also has dizziness described as room-spinning sensation with headaches lasting about 15 minutes.  Has been taking the sumatriptan with severe headaches 3-4 times/week. Also taking Tylenol. Reports improvement when leaning her head back. Has been taking the propranolol twice a day as instructed.  Patient reports she had a concussion due to head injury from falling on her head while cheerleading (someone also fell on her head) 10 years ago which was when she first noticed the migraines, but then worsened 2 years ago. Had a severe headache yesterday at work and had to be sent home. Still having migraine and wondering if she should go back to work.  PERTINENT  PMH / PSH: migraines  OBJECTIVE:   BP 130/80   Pulse 66   Ht 4\' 11"  (1.499 m)   Wt 163 lb (73.9 kg)   SpO2 99%   BMI 32.92 kg/m   General: Overweight young female, uncomfortably appearing, NAD CV: RRR, no murmurs Pulm: CTAB, no wheezes or rales Neuro: Alert, interactive, CN II-XII, 5/5 strength all extremities, finger-to-nose intact  ASSESSMENT/PLAN:   Migraine headache without aura Chronic,  worsening over the past 2 years with vertiginous component. Non-focal neuro exam. No improvement since last visit. Neurology appointment next month. - increase propranolol to 20 mg BID - continue sumitriptan 50 mg prn - start meclizine 12.5 mg BID prn - continue Tylenol prn - f/u prn if no improvement     , MD [facility]

## 2020-09-10 NOTE — Patient Instructions (Incomplete)
It was nice seeing you today!  I am sorry you are still having these bad migraines.  I am increasing your propranolol to 20 mg twice a day.  You can try a medication called meclizine to use up to twice a day as needed for dizziness/vertigo.  Stay well, Sabrina Deeds, MD [facility] [phone]    Migraine Headache A migraine headache is a very strong throbbing pain on one side or both sides of your head. This type of headache can also cause other symptoms. It can last from 4 hours to 3 days. Talk with your doctor about what things may bring on (trigger) this condition. What are the causes? The exact cause of this condition is not known. This condition may be triggered or caused by:  Drinking alcohol.  Smoking.  Taking medicines, such as: ? Medicine used to treat chest pain (nitroglycerin). ? Birth control pills. ? Estrogen. ? Some blood pressure medicines.  Eating or drinking certain products.  Doing physical activity. Other things that may trigger a migraine headache include:  Having a menstrual period.  Pregnancy.  Hunger.  Stress.  Not getting enough sleep or getting too much sleep.  Weather changes.  Tiredness (fatigue). What increases the risk?  Being 87-50 years old.  Being female.  Having a family history of migraine headaches.  Being Caucasian.  Having depression or anxiety.  Being very overweight. What are the signs or symptoms?  A throbbing pain. This pain may: ? Happen in any area of the head, such as on one side or both sides. ? Make it hard to do daily activities. ? Get worse with physical activity. ? Get worse around bright lights or loud noises.  Other symptoms may include: ? Feeling sick to your stomach (nauseous). ? Vomiting. ? Dizziness. ? Being sensitive to bright lights, loud noises, or smells.  Before you get a migraine headache, you may get warning signs (an aura). An aura may include: ? Seeing  flashing lights or having blind spots. ? Seeing bright spots, halos, or zigzag lines. ? Having tunnel vision or blurred vision. ? Having numbness or a tingling feeling. ? Having trouble talking. ? Having weak muscles.  Some people have symptoms after a migraine headache (postdromal phase), such as: ? Tiredness. ? Trouble thinking (concentrating). How is this treated?  Taking medicines that: ? Relieve pain. ? Relieve the feeling of being sick to your stomach. ? Prevent migraine headaches.  Treatment may also include: ? Having acupuncture. ? Avoiding foods that bring on migraine headaches. ? Learning ways to control your body functions (biofeedback). ? Therapy to help you know and deal with negative thoughts (cognitive behavioral therapy). Follow these instructions at home: Medicines  Take over-the-counter and prescription medicines only as told by your doctor.  Ask your doctor if the medicine prescribed to you: ? Requires you to avoid driving or using heavy machinery. ? Can cause trouble pooping (constipation). You may need to take these steps to prevent or treat trouble pooping:  Drink enough fluid to keep your pee (urine) pale yellow.  Take over-the-counter or prescription medicines.  Eat foods that are high in fiber. These include beans, whole grains, and fresh fruits and vegetables.  Limit foods that are high in fat and sugar. These include fried or sweet foods. Lifestyle  Do not drink alcohol.  Do not use any products that contain nicotine or tobacco, such as cigarettes, e-cigarettes, and chewing tobacco. If you need help quitting, ask your  doctor.  Get at least 8 hours of sleep every night.  Limit and deal with stress. General instructions  Keep a journal to find out what may bring on your migraine headaches. For example, write down: ? What you eat and drink. ? How much sleep you get. ? Any change in what you eat or drink. ? Any change in your  medicines.  If you have a migraine headache: ? Avoid things that make your symptoms worse, such as bright lights. ? It may help to lie down in a dark, quiet room. ? Do not drive or use heavy machinery. ? Ask your doctor what activities are safe for you.  Keep all follow-up visits as told by your doctor. This is important.      Contact a doctor if:  You get a migraine headache that is different or worse than others you have had.  You have more than 15 headache days in one month. Get help right away if:  Your migraine headache gets very bad.  Your migraine headache lasts longer than 72 hours.  You have a fever.  You have a stiff neck.  You have trouble seeing.  Your muscles feel weak or like you cannot control them.  You start to lose your balance a lot.  You start to have trouble walking.  You pass out (faint).  You have a seizure. Summary  A migraine headache is a very strong throbbing pain on one side or both sides of your head. These headaches can also cause other symptoms.  This condition may be treated with medicines and changes to your lifestyle.  Keep a journal to find out what may bring on your migraine headaches.  Contact a doctor if you get a migraine headache that is different or worse than others you have had.  Contact your doctor if you have more than 15 headache days in a month. This information is not intended to replace advice given to you by your health care provider. Make sure you discuss any questions you have with your health care provider. Document Revised: 11/10/2018 Document Reviewed: 08/31/2018 Elsevier Patient Education  2021 [employer].

## 2020-09-11 ENCOUNTER — Other Ambulatory Visit: Payer: Self-pay

## 2020-09-11 ENCOUNTER — Ambulatory Visit (INDEPENDENT_AMBULATORY_CARE_PROVIDER_SITE_OTHER): Payer: 59 | Admitting: Family Medicine

## 2020-09-11 ENCOUNTER — Encounter: Payer: Self-pay | Admitting: Family Medicine

## 2020-09-11 VITALS — BP 130/80 | HR 66 | Ht 59.0 in | Wt 163.0 lb

## 2020-09-11 DIAGNOSIS — G43109 Migraine with aura, not intractable, without status migrainosus: Secondary | ICD-10-CM

## 2020-09-11 DIAGNOSIS — Z0000 Encounter for general adult medical examination without abnormal findings: Secondary | ICD-10-CM

## 2020-09-11 DIAGNOSIS — G43009 Migraine without aura, not intractable, without status migrainosus: Secondary | ICD-10-CM

## 2020-09-11 DIAGNOSIS — Z3042 Encounter for surveillance of injectable contraceptive: Secondary | ICD-10-CM | POA: Diagnosis not present

## 2020-09-11 MED ORDER — PROPRANOLOL HCL 20 MG PO TABS: 20.0000 mg | ORAL_TABLET | Freq: Two times a day (BID) | ORAL | 0 refills | Status: DC

## 2020-09-11 MED ORDER — MECLIZINE HCL 12.5 MG PO TABS: 12.5000 mg | ORAL_TABLET | Freq: Two times a day (BID) | ORAL | 0 refills | Status: DC | PRN

## 2020-09-11 NOTE — Assessment & Plan Note (Signed)
Chronic, worsening over the past 2 years with vertiginous component. Non-focal neuro exam. No improvement since last visit. Neurology appointment next month. - increase propranolol to 20 mg BID - continue sumitriptan 50 mg prn - start meclizine 12.5 mg BID prn - continue Tylenol prn - f/u prn if no improvement

## 2020-09-17 ENCOUNTER — Encounter: Payer: Self-pay | Admitting: Family Medicine

## 2020-09-17 ENCOUNTER — Ambulatory Visit (INDEPENDENT_AMBULATORY_CARE_PROVIDER_SITE_OTHER): Payer: 59 | Admitting: Family Medicine

## 2020-09-17 ENCOUNTER — Other Ambulatory Visit: Payer: Self-pay

## 2020-09-17 VITALS — BP 108/80 | HR 77 | Wt 162.4 lb

## 2020-09-17 DIAGNOSIS — Z0000 Encounter for general adult medical examination without abnormal findings: Secondary | ICD-10-CM

## 2020-09-17 DIAGNOSIS — Z111 Encounter for screening for respiratory tuberculosis: Secondary | ICD-10-CM

## 2020-09-17 NOTE — Progress Notes (Signed)
    SUBJECTIVE:   Chief compliant/HPI: annual examination  Sabrina Brennan is a 30 y.o. who presents today for an annual exam.  She has no concerns at this time.  Indicates has not had any new migraines after starting new regimen.  Indicates she had liposuction surgery in [place] and is healing well.  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  Updated history tabs and problem list.   OBJECTIVE:   BP 108/80   Pulse 77   Wt 162 lb 6.4 oz (73.7 kg)   SpO2 96%   BMI 32.80 kg/m    Physical Exam Constitutional:      General: She is not in acute distress.    Appearance: She is not ill-appearing.  HENT:     Head: Normocephalic and atraumatic.     Mouth/Throat:     Mouth: Mucous membranes are moist.  Eyes:     Extraocular Movements: Extraocular movements intact.     Conjunctiva/sclera: Conjunctivae normal.  Cardiovascular:     Rate and Rhythm: Normal rate and regular rhythm.     Pulses: Normal pulses.  Pulmonary:     Effort: Pulmonary effort is normal.     Breath sounds: Normal breath sounds.  Abdominal:     General: Abdomen is flat. There is no distension.     Palpations: Abdomen is soft.     Tenderness: There is no abdominal tenderness.  Musculoskeletal:        General: No swelling or tenderness. Normal range of motion.  Neurological:     Mental Status: She is alert.     ASSESSMENT/PLAN:   Annual Examination   PHQ score 0, reviewed. Blood pressure reviewed and at goal.    Considered the following items based upon USPSTF recommendations: HIV testing: Neg- 9/17 Hepatitis C: Neg-1/26 GC/CT not at high risk and not ordered. Lipid panel (nonfasting or fasting) discussed based upon AHA recommendations and not ordered. Given age.  TB test today. - Return for follow-up nurse visit in 2 days  Discussed family history Cervical cancer screening: Last Pap Smear 10/21 Immunizations- UTD, needs COVID booster, will return later this month when has time off,  had side effects last time for 3 days.   Follow up in 1 year or sooner if indicated.    Jovita Kussmaul, MD [facility]

## 2020-09-17 NOTE — Patient Instructions (Signed)
It was good to see you today.  Thank you for coming in.  You are cleared for work with no issues.  We are giving you at TB test today.  Please come back on Friday to have it read by the nurse.  Please come to see [place] for your COVID booster when you have time off from work.    Follow-up in 1 year or soon if you have any medical issues.  Be Well, Dr Pecola Leisure

## 2020-09-19 ENCOUNTER — Other Ambulatory Visit: Payer: Self-pay

## 2020-09-19 ENCOUNTER — Ambulatory Visit (INDEPENDENT_AMBULATORY_CARE_PROVIDER_SITE_OTHER): Payer: 59

## 2020-09-19 DIAGNOSIS — Z111 Encounter for screening for respiratory tuberculosis: Secondary | ICD-10-CM

## 2020-09-19 LAB — TB SKIN TEST
Induration: 12 mm
TB Skin Test: NEGATIVE

## 2020-09-19 NOTE — Progress Notes (Signed)
Patient is here for a PPD read.  It was placed on 09/17/2020 in the left forearm @ 1412 am/pm.    PPD RESULTS:  Result: negative Induration: 12 mm  Asked preceptor (Dr. Deirdre Priest) to evaluate as area is red and slightly raised. Provided measurement of 12 mm. Then spoke with Dr. Pecola Leisure regarding reactivity. Patient does not have any risk factors and test is considered negative due to result being less than 15 mm.   Letter created and given to patient for documentation purposes. Veronda Prude, RN

## 2020-09-25 ENCOUNTER — Ambulatory Visit (INDEPENDENT_AMBULATORY_CARE_PROVIDER_SITE_OTHER): Payer: 59 | Admitting: Family Medicine

## 2020-09-25 ENCOUNTER — Other Ambulatory Visit: Payer: Self-pay

## 2020-09-25 VITALS — BP 110/80 | HR 82

## 2020-09-25 DIAGNOSIS — B349 Viral infection, unspecified: Secondary | ICD-10-CM | POA: Insufficient documentation

## 2020-09-25 NOTE — Progress Notes (Signed)
Subjective:   Patient ID: Sabrina Brennan    DOB: 06/24/1991, 30 y.o. female   MRN: [id number]  Sabrina Brennan is a 30 y.o. female with a history of migraine headaches, asthma, constipation, cervical dysplasia here for acute illness  HPI: Patient presents with 1 day history of body aches, headache, runny diarrhea, chills/night sweats and decreased p.o. intake.  She notes that she had a Covid exposure last Friday.  She lives with her sister who is also demonstrating some symptoms.  She is Covid vaccinated x2.  Denies any shortness of breath or difficulty breathing.  Denies any ear pain, nausea, vomiting.  She is supposed to travel to see her elderly grandparents on February 27.  Review of Systems:  Per HPI.   Objective:   BP 110/80   Pulse 82   SpO2 98%  Vitals and nursing note reviewed.  General: pleasant young female, appears to feel unwell, sitting up on exam bed, in no acute distress with non-toxic appearance HEENT: normocephalic, atraumatic, moist mucous membranes, oropharynx clear without erythema or exudate, clear fluid level to mid TM on right, minimal fluid level on right however no signs of acute infection, TM normal with normal cone of light, nasal turbinates swollen and moist CV: regular rate and rhythm without murmurs, rubs, or gallops, Lungs: clear to auscultation bilaterally with normal work of breathing on room air, speaking in full sentences Skin: warm, dry MSK:  gait normal Neuro: Alert and oriented, speech normal  Assessment & Plan:   Acute viral disease Symptoms appear most consistent with viral illness with dry cough. Overall patient is well appearing, well hydrated, without respiratory distress, and with no red flag symptoms. Patient is tolerating PO and staying hydrated. Low suspicion for bacterial infection and do not feel imaging is warranted at this time. Discussed symptomatic treatment, wearing masks and social distancing.  Encouraged COVID testing.  - COVID  testing performed today - Recommended Flonase QD and frequent valsalva maneuvers  - Patient may mucinex, cough/cold medicine - Recommended nasal saline, frequent suctioning, and humidifier to help with symptoms.  - Tylenol/Ibuprofen PRN for discomfort.  - She may try honey to help with her cough if desired.  - Given symptom similarity with influenza, discussed that patient may trial Tamiflu given it is <3 days since symptoms onset - Strict return precautions discussed - note given to patient to excuse her from previously arranged air travel   Orders Placed This Encounter  Procedures  . Novel Coronavirus, NAA ([facility])    Order Specific Question:   Is this test for diagnosis or screening    Answer:   Diagnosis of ill patient    Order Specific Question:   Symptomatic for COVID-19 as defined by CDC    Answer:   Yes    Order Specific Question:   Date of Symptom Onset    Answer:   09/24/2020    Order Specific Question:   Hospitalized for COVID-19    Answer:   No    Order Specific Question:   Admitted to ICU for COVID-19    Answer:   No    Order Specific Question:   Previously tested for COVID-19    Answer:   Unknown    Order Specific Question:   Resident in a congregate (group) care setting    Answer:   No    Order Specific Question:   Is the patient student?    Answer:   No    Order Specific Question:  Employed in healthcare setting    Answer:   No    Order Specific Question:   Has patient completed COVID vaccination(s) (2 doses of Pfizer/Moderna 1 dose of [employer])    Answer:   Yes    Order Specific Question:   Has patient completed COVID Booster / 3rd dose    Answer:   No    Order Specific Question:   Pregnant    Answer:   No   No orders of the defined types were placed in this encounter.   Orpah Cobb, DO PGY-3, [facility] 09/25/2020 5:15 PM

## 2020-09-25 NOTE — Patient Instructions (Signed)
   I have obtained COVID testing. I will call you with the results. You will also be able to see the results via MyChart.  Please use Flonase: 2 sprays in each nostril (point toward your ear) daily for the next 4 weeks  Be sure to stay well hydrated. Treat pain, fevers, or discomfort with Tylenol or Ibuprofen.   If you would like, you can trial Tamiflu   What You Can Do to Feel Better When You Have a Viral Illness Common Symptoms: runny eyes, muscle aches, throat irritation, ear pain, cough, sneezing, runny nose or congested nose, sinus pressure, headache, fever, fatigue   For your cough, try these:  Teaspoon of honey either alone or mixed with warm water  Tessalon pearls  Lozenges or hard candies  Laying with head elevated  Vicks/menthol rub topically on chest   For your congestion, try these:   Steroid nasal spray such as fluticasone or budesonide- helps prevent swelling in the nasal passage  Guaifenesin (mucinex) - helps thin the mucus  Steam- in a closed bathroom with hot shower running or a bedside humidifier  Drinking plenty of fluids to stay hydrated can help thin mucus   For your runny nose:   Atrovent nasal spray- helps decrease the drainage (can lead to dryness if overdone)  Nasal rinses such as a netty pot or a bulb syringe using filtered water mixed with small amount of baking soda and/or sea salt   For your fever:   Acetaminophen (Tylenol) up to 4g per day for most people  Ibuprofen 600mg  up to three times per day for most people   For your sore throat:  Drinking either warm or cold liquids (whichever feels best to you)  Gargling warm salt water     Help prevent spreading of infection to others.  Wash your hands frequently  Avoid crowded places  Wear a mask when in public  Get your regularly scheduled vaccinations as they are recommended by the CDC.   Fun facts: -Antibiotics treat bacteria and have no effect on viruses so are not helpful in  the vast majority of upper respiratory illnesses which are caused by common cold or flu viruses.  -Generic over the counter (OTC) medications have the same active ingredients and effectiveness of the more expensive name-brand version. -Vaccines are available to prevent infection with several of the most infectious/deadly viruses.

## 2020-09-25 NOTE — Assessment & Plan Note (Addendum)
Symptoms appear most consistent with viral illness with dry cough. Overall patient is well appearing, well hydrated, without respiratory distress, and with no red flag symptoms. Patient is tolerating PO and staying hydrated. Low suspicion for bacterial infection and do not feel imaging is warranted at this time. Discussed symptomatic treatment, wearing masks and social distancing.  Encouraged COVID testing.  - COVID testing performed today - Recommended Flonase QD and frequent valsalva maneuvers  - Patient may mucinex, cough/cold medicine - Recommended nasal saline, frequent suctioning, and humidifier to help with symptoms.  - Tylenol/Ibuprofen PRN for discomfort.  - She may try honey to help with her cough if desired.  - Given symptom similarity with influenza, discussed that patient may trial Tamiflu given it is <3 days since symptoms onset - Strict return precautions discussed - note given to patient to excuse her from previously arranged air travel

## 2020-09-27 LAB — SARS-COV-2, NAA 2 DAY TAT

## 2020-09-27 LAB — NOVEL CORONAVIRUS, NAA: SARS-CoV-2, NAA: NOT DETECTED

## 2020-10-28 NOTE — Progress Notes (Signed)
[facility]    Provider:  Dr Lucia GaskinsAhern Requesting Provider: Dana AllanWalsh, Tanya, MD Primary Care Provider:  Dana AllanWalsh, Tanya, MD  CC:  migraines  HPI:  Sabrina Brennan is a 30 y.o. female here as requested by Dana AllanWalsh, Tanya, MD for headaches.  Past medical history migraines, asthma, constipation.  I reviewed notes from Dr. Clent RidgesWalsh: Patient's had headaches for 10 years, throbbing, 2-3 times a week, prior treatments including Excedrin, Tylenol, sumatriptan, Fioricet, no nausea or vomiting but did endorse photophobia and phonophobia, with a family history of migraines and personal stressors.  Diagnosed with migraine without aura.  Neuro examination showed no focal deficits.  Imitrex was increased to 50 mg, they started propranolol 10 mg twice daily, neurology referral was placed.  Patient is here alone, patient has migraines, in fact had a migraine yesterday she tried to sleep it off but it still there today.  She is had them for years, after high school.  She was dropped on her head while cheerleading and she has had headaches since then.  She was given butalbital.  She gets migraines at least 4 times a week and they can last up to 2 days.  Other medications tried include sumatriptan, propranolol and Excedrin. Her migraines starts on the left side but can be either side behind the eyes, vision will get blurry, feel the pain, pounding/pulsating/throbbing, photo/phonophobia, nausea, can be moderately severe to severe, one migraine she felt left-sided numbness and weakness, she had to pull over, she couldn't see, this has happened 5x in the setting of migraines, can last 24-72 hours at least, movement makes it worse, she is having episodes of weakness on the left side. She has daily headaches. Unknown triggers. 12 migraine days in a month for years.   Reviewed notes, labs and imaging from outside physicians, which showed:  From a thorough review of records, medications tried that can be used in migraine  management include: Amitriptyline, Tylenol, Excedrin Migraine, Fioricet, Celebrex, Decadron injection, ibuprofen, ketorolac injection, meclizine, Reglan tablets and injections, Zofran oral and injections, prednisone tablets, Phenergan tablets and injections, propranolol tablets, sumatriptan, imitrex.    CT head 02-05-2013: showed No acute intracranial abnormalities including mass lesion or mass effect, hydrocephalus, extra-axial fluid collection, midline shift, hemorrhage, or acute infarction, large ischemic events (personally reviewed images)   04/2020: cbc/cmp/tsh nml  Review of Systems: Patient complains of symptoms per HPI as well as the following symptoms vision loss, positional headache, left sided weakness, headache right now. Pertinent negatives and positives per HPI. All others negative.   Social History   Socioeconomic History  . Marital status: Single    Spouse name: Not on file  . Number of children: Not on file  . Years of education: Not on file  . Highest education level: Not on file  Occupational History  . Not on file  Tobacco Use  . Smoking status: Light Tobacco Smoker  . Smokeless tobacco: Never Used  . Tobacco comment: Hooka 2x monthly  Vaping Use  . Vaping Use: Never used  Substance and Sexual Activity  . Alcohol use: Yes    Alcohol/week: 4.0 - 5.0 standard drinks    Types: 4 - 5 Glasses of wine per week  . Drug use: No  . Sexual activity: Yes    Birth control/protection: Injection  Other Topics Concern  . Not on file  Social History Narrative   Lives with her mom   Right handed   Caffeine: 2 liter pepsi/day   Social Determinants of  Health   Financial Resource Strain: Not on file  Food Insecurity: Not on file  Transportation Needs: Not on file  Physical Activity: Not on file  Stress: Not on file  Social Connections: Not on file  Intimate Partner Violence: Not on file    Family History  Problem Relation Age of Onset  . Healthy Mother   . Healthy  Father   . Breast cancer Maternal Grandmother   . Hypertension Maternal Grandmother   . Pancreatic cancer Maternal Grandfather   . Migraines Maternal Grandfather   . Breast cancer Other        Maternal aunt    Past Medical History:  Diagnosis Date  . Medical history non-contributory   . Migraine     Patient Active Problem List   Diagnosis Date Noted  . 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 Chronic migraine without aura without status migrainosus, not intractable 10/29/2020  . Chronic migraine with aura 10/29/2020  . Acute viral disease 09/25/2020  . History of abnormal cervical Pap smear 08/28/2020  . Acute asthma exacerbation 08/03/2020  . Left ear pain 07/22/2020  . Migraine headache without aura 07/03/2020  . Right lower quadrant abdominal pain 05/01/2020  . Constipation 04/21/2020  . 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 Breast mass in female 08/29/2013    Past Surgical History:  Procedure Laterality Date  . EXCISION MASS LOWER EXTREMETIES Left 12/11/2019   Procedure: EXCISION LEFT THIGH CYST;  Surgeon: Harriette Bouillon, MD;  Location: [facility];  Service: General;  Laterality: Left;  . LIPOSUCTION MULTIPLE BODY PARTS      Current Outpatient Medications  Medication Sig Dispense Refill  . ipratropium (ATROVENT) 0.03 % nasal spray Place 2 sprays into both nostrils 2 (two) times daily. 30 mL 0  . meclizine (ANTIVERT) 12.5 MG tablet Take 1 tablet (12.5 mg total) by mouth 2 (two) times daily as needed for dizziness. 30 tablet 0  . medroxyPROGESTERone (DEPO-PROVERA) 150 MG/ML injection Inject 150 mg into the muscle every 3 (three) months.    . ondansetron (ZOFRAN-ODT) 4 MG disintegrating tablet Take 1-2 tablets (4-8 mg total) by mouth every 8 (eight) hours as needed for nausea. 30 tablet 3  . rizatriptan (MAXALT-MLT) 10 MG disintegrating tablet Take 1 tablet (10  mg total) by mouth as needed for migraine. May repeat in 2 hours if needed 9 tablet 11  . SUMAtriptan (IMITREX) 50 MG tablet Take 1 tablet (50 mg total) by mouth every 2 (two) hours as needed for migraine. May repeat in 2 hours if headache persists or recurs. 10 tablet 0  . topiramate (TOPAMAX) 50 MG tablet Start with one pill at bedtime and in 2 weeks increase to 2 pills at bedtime 60 tablet 6  . propranolol (INDERAL) 20 MG tablet Take 1 tablet (20 mg total) by mouth 2 (two) times daily. 60 tablet 0   Current Facility-Administered Medications  Medication Dose Route Frequency Provider Last Rate Last Admin  . medroxyPROGESTERone Acetate SUSY 150 mg  150 mg Intramuscular Q90 days Janit Pagan T, MD   150 mg at 05/20/20 1101    Allergies as of 10/29/2020  . (No Known Allergies)    Vitals: BP 124/86 (BP Location: Right Arm, Patient Position: Sitting)   Pulse 88   Ht 4\' 11"  (1.499 m)   Wt 161 lb (73 kg)   BMI 32.52 kg/m  Last Weight:  Wt Readings from Last  1 Encounters:  10/29/20 161 lb (73 kg)   Last Height:   Ht Readings from Last 1 Encounters:  10/29/20 4\' 11"  (1.499 m)     Physical exam: Exam: Gen: NAD, conversant, well nourised, obese, well groomed                     CV: RRR, no MRG. No Carotid Bruits. No peripheral edema, warm, nontender Eyes: Conjunctivae clear without exudates or hemorrhage  Neuro: Detailed Neurologic Exam  Speech:    Speech is normal; fluent and spontaneous with normal comprehension.  Cognition:    The patient is oriented to person, place, and time;     recent and remote memory intact;     language fluent;     normal attention, concentration,     fund of knowledge Cranial Nerves:    The pupils are equal, round, and reactive to light. The fundi are normal and spontaneous venous pulsations are present. Visual fields are full to finger confrontation. Extraocular movements are intact. Trigeminal sensation is intact and the muscles of mastication  are normal. The face is symmetric. The palate elevates in the midline. Hearing intact. Voice is normal. Shoulder shrug is normal. The tongue has normal motion without fasciculations.   Coordination:    Normal finger to nose and heel to shin. Normal rapid alternating movements.   Gait:    Heel-toe and tandem gait are normal.   Motor Observation:    No asymmetry, no atrophy, and no involuntary movements noted. Tone:    Normal muscle tone.    Posture:    Posture is normal. normal erect    Strength:    Strength is V/V in the upper and lower limbs.      Sensation: intact to LT     Reflex Exam:  DTR's:    Deep tendon reflexes in the upper and lower extremities are normal bilaterally.   Toes:    The toes are downgoing bilaterally.   Clonus:    Clonus is absent.    Assessment/Plan:  30 year old with likely hemiplegic migraines but given concerning symptoms needs brain imaging.  MRI brain w/wo contrast: MRI brain due to concerning symptoms of morning headaches, positional headaches,vision changes, episode of left-sided numbness and weakness  to look for space occupying mass, chiari or intracranial hypertension (pseudotumor), stroke or other.   Start Topiramate. If ineffective or side effects, will start Emgality or Ajovy. Discussed teratogenicity, do not get pregnant on medications or for 6 months afterwards stopping.   Rizatriptan: Please take one tablet at the onset of your headache. If it does not improve the symptoms please take one additional tablet. Do not take more then 2 tablets in 24hrs. Do not take use more then 2 to 3 times in a week.  Discussed:  There is increased risk for stroke in women with migraine with aura and a contraindication for the combined contraceptive pill for use by women who have migraine with aura. The risk for women with migraine without aura is lower. However other risk factors like smoking are far more likely to increase stroke risk than migraine. There  is a recommendation for no smoking and for the use of OCPs without estrogen such as progestogen only pills particularly for women with migraine with aura.26 People who have migraine headaches with auras may be 3 times more likely to have a stroke caused by a blood clot, compared to migraine patients who don't see auras. Women who take hormone-replacement  therapy may be 30 percent more likely to suffer a clot-based stroke than women not taking medication containing estrogen. Other risk factors like smoking and high blood pressure may be  much more important.   Orders Placed This Encounter  Procedures  . MR BRAIN W WO CONTRAST   Meds ordered this encounter  Medications  . rizatriptan (MAXALT-MLT) 10 MG disintegrating tablet    Sig: Take 1 tablet (10 mg total) by mouth as needed for migraine. May repeat in 2 hours if needed    Dispense:  9 tablet    Refill:  11  . ondansetron (ZOFRAN-ODT) 4 MG disintegrating tablet    Sig: Take 1-2 tablets (4-8 mg total) by mouth every 8 (eight) hours as needed for nausea.    Dispense:  30 tablet    Refill:  3  . topiramate (TOPAMAX) 50 MG tablet    Sig: Start with one pill at bedtime and in 2 weeks increase to 2 pills at bedtime    Dispense:  60 tablet    Refill:  6   Discussed: To prevent or relieve headaches, try the following: Cool Compress. Lie down and place a cool compress on your head.  Avoid headache triggers. If certain foods or odors seem to have triggered your migraines in the past, avoid them. A headache diary might help you identify triggers.  Include physical activity in your daily routine. Try a daily walk or other moderate aerobic exercise.  Manage stress. Find healthy ways to cope with the stressors, such as delegating tasks on your to-do list.  Practice relaxation techniques. Try deep breathing, yoga, massage and visualization.  Eat regularly. Eating regularly scheduled meals and maintaining a healthy diet might help prevent headaches.  Also, drink plenty of fluids.  Follow a regular sleep schedule. Sleep deprivation might contribute to headaches Consider biofeedback. With this mind-body technique, you learn to control certain bodily functions -- such as muscle tension, heart rate and blood pressure -- to prevent headaches or reduce headache pain.    Proceed to emergency room if you experience new or worsening symptoms or symptoms do not resolve, if you have new neurologic symptoms or if headache is severe, or for any concerning symptom.   Provided education and documentation from [employer] toolbox including articles on: chronic migraine medication overuse headache, chronic migraines, prevention of migraines, behavioral and other nonpharmacologic treatments for headache.  Cc: Dana Allan, MD,  Dana Allan, MD  Naomie Dean, MD  [facility] [address] [place], [place] [address]  Phone [phone] Fax [phone]

## 2020-10-29 ENCOUNTER — Ambulatory Visit (INDEPENDENT_AMBULATORY_CARE_PROVIDER_SITE_OTHER): Payer: 59 | Admitting: Neurology

## 2020-10-29 ENCOUNTER — Encounter: Payer: Self-pay | Admitting: Neurology

## 2020-10-29 ENCOUNTER — Telehealth: Payer: Self-pay | Admitting: Neurology

## 2020-10-29 VITALS — BP 124/86 | HR 88 | Ht 59.0 in | Wt 161.0 lb

## 2020-10-29 DIAGNOSIS — G43109 Migraine with aura, not intractable, without status migrainosus: Secondary | ICD-10-CM

## 2020-10-29 DIAGNOSIS — R200 Anesthesia of skin: Secondary | ICD-10-CM

## 2020-10-29 DIAGNOSIS — G43709 Chronic migraine without aura, not intractable, without status migrainosus: Secondary | ICD-10-CM

## 2020-10-29 DIAGNOSIS — G43409 Hemiplegic migraine, not intractable, without status migrainosus: Secondary | ICD-10-CM

## 2020-10-29 DIAGNOSIS — R531 Weakness: Secondary | ICD-10-CM

## 2020-10-29 DIAGNOSIS — H547 Unspecified visual loss: Secondary | ICD-10-CM

## 2020-10-29 DIAGNOSIS — R519 Headache, unspecified: Secondary | ICD-10-CM

## 2020-10-29 DIAGNOSIS — R51 Headache with orthostatic component, not elsewhere classified: Secondary | ICD-10-CM

## 2020-10-29 MED ORDER — TOPIRAMATE 50 MG PO TABS: ORAL_TABLET | ORAL | 6 refills | Status: DC

## 2020-10-29 MED ORDER — KETOROLAC TROMETHAMINE 60 MG/2ML IM SOLN
60.0000 mg | Freq: Once | INTRAMUSCULAR | Status: AC
  Administered 2020-10-29: 60 mg via INTRAMUSCULAR

## 2020-10-29 MED ORDER — RIZATRIPTAN BENZOATE 10 MG PO TBDP: 10.0000 mg | ORAL_TABLET | ORAL | 11 refills | Status: DC | PRN

## 2020-10-29 MED ORDER — ONDANSETRON 4 MG PO TBDP: 4.0000 mg | ORAL_TABLET | Freq: Three times a day (TID) | ORAL | 3 refills | Status: DC | PRN

## 2020-10-29 NOTE — Patient Instructions (Signed)
MRI of the brain  Start Topiramate. If ineffective or side effects, will start Emgality or Ajovy. Discussed teratogenicity, do not get pregnant on medications or for 6 months afterwards stopping.   Rizatriptan: Please take one tablet at the onset of your headache. If it does not improve the symptoms please take one additional tablet. Do not take more then 2 tablets in 24hrs. Do not take use more then 2 to 3 times in a week. Take with ondansetron or take ondansetron for nausea.   Ondansetron oral dissolving tablet What is this medicine? ONDANSETRON (on DAN se tron) is used to treat nausea and vomiting caused by chemotherapy. It is also used to prevent or treat nausea and vomiting after surgery. This medicine may be used for other purposes; ask your health care provider or pharmacist if you have questions. COMMON BRAND NAME(S): Zofran ODT What should I tell my health care provider before I take this medicine? They need to know if you have any of these conditions:  heart disease  history of irregular heartbeat  liver disease  low levels of magnesium or potassium in the blood  an unusual or allergic reaction to ondansetron, granisetron, other medicines, foods, dyes, or preservatives  pregnant or trying to get pregnant  breast-feeding How should I use this medicine? These tablets are made to dissolve in the mouth. Do not try to push the tablet through the foil backing. With dry hands, peel away the foil backing and gently remove the tablet. Place the tablet in the mouth and allow it to dissolve, then swallow. While you may take these tablets with water, it is not necessary to do so. Talk to your pediatrician regarding the use of this medicine in children. Special care may be needed. Overdosage: If you think you have taken too much of this medicine contact a poison control center or emergency room at once. NOTE: This medicine is only for you. Do not share this medicine with others. What if  I miss a dose? If you miss a dose, take it as soon as you can. If it is almost time for your next dose, take only that dose. Do not take double or extra doses. What may interact with this medicine? Do not take this medicine with any of the following medications:  apomorphine  certain medicines for fungal infections like fluconazole, itraconazole, ketoconazole, posaconazole, voriconazole  cisapride  dronedarone  pimozide  thioridazine This medicine may also interact with the following medications:  carbamazepine  certain medicines for depression, anxiety, or psychotic disturbances  fentanyl  linezolid  MAOIs like Carbex, Eldepryl, Marplan, Nardil, and Parnate  methylene blue (injected into a vein)  other medicines that prolong the QT interval (cause an abnormal heart rhythm) like dofetilide, ziprasidone  phenytoin  rifampicin  tramadol This list may not describe all possible interactions. Give your health care provider a list of all the medicines, herbs, non-prescription drugs, or dietary supplements you use. Also tell them if you smoke, drink alcohol, or use illegal drugs. Some items may interact with your medicine. What should I watch for while using this medicine? Check with your doctor or health care professional as soon as you can if you have any sign of an allergic reaction. What side effects may I notice from receiving this medicine? Side effects that you should report to your doctor or health care professional as soon as possible:  allergic reactions like skin rash, itching or hives, swelling of the face, lips, or tongue  breathing problems  confusion  dizziness  fast or irregular heartbeat  feeling faint or lightheaded, falls  fever and chills  loss of balance or coordination  seizures  sweating  swelling of the hands and feet  tightness in the chest  tremors  unusually weak or tired Side effects that usually do not require medical  attention (report to your doctor or health care professional if they continue or are bothersome):  constipation or diarrhea  headache This list may not describe all possible side effects. Call your doctor for medical advice about side effects. You may report side effects to FDA at 1-800-FDA-1088. Where should I keep my medicine? Keep out of the reach of children. Store between 2 and 30 degrees C (36 and 86 degrees F). Throw away any unused medicine after the expiration date. NOTE: This sheet is a summary. It may not cover all possible information. If you have questions about this medicine, talk to your doctor, pharmacist, or health care provider.  2021 Elsevier/Gold Standard (2018-07-11 07:14:10) Rizatriptan disintegrating tablets What is this medicine? RIZATRIPTAN (rye za TRIP tan) is used to treat migraines with or without aura. An aura is a strange feeling or visual disturbance that warns you of an attack. It is not used to prevent migraines. This medicine may be used for other purposes; ask your health care provider or pharmacist if you have questions. COMMON BRAND NAME(S): Maxalt-MLT What should I tell my health care provider before I take this medicine? They need to know if you have any of these conditions:  cigarette smoker  circulation problems in fingers and toes  diabetes  heart disease  high blood pressure  high cholesterol  history of irregular heartbeat  history of stroke  kidney disease  liver disease  stomach or intestine problems  an unusual or allergic reaction to rizatriptan, other medicines, foods, dyes, or preservatives  pregnant or trying to get pregnant  breast-feeding How should I use this medicine? Take this medicine by mouth. Follow the directions on the prescription label. Leave the tablet in the sealed blister pack until you are ready to take it. With dry hands, open the blister and gently remove the tablet. If the tablet breaks or crumbles,  throw it away and take a new tablet out of the blister pack. Place the tablet in the mouth and allow it to dissolve, and then swallow. Do not cut, crush, or chew this medicine. You do not need water to take this medicine. Do not take it more often than directed. Talk to your pediatrician regarding the use of this medicine in children. While this drug may be prescribed for children as young as 6 years for selected conditions, precautions do apply. Overdosage: If you think you have taken too much of this medicine contact a poison control center or emergency room at once. NOTE: This medicine is only for you. Do not share this medicine with others. What if I miss a dose? This does not apply. This medicine is not for regular use. What may interact with this medicine? Do not take this medicine with any of the following medicines:  certain medicines for migraine headache like almotriptan, eletriptan, frovatriptan, naratriptan, rizatriptan, sumatriptan, zolmitriptan  ergot alkaloids like dihydroergotamine, ergonovine, ergotamine, methylergonovine  MAOIs like Carbex, Eldepryl, Marplan, Nardil, and Parnate This medicine may also interact with the following medications:  certain medicines for depression, anxiety, or psychotic disorders  propranolol This list may not describe all possible interactions. Give your health care provider a list of all  the medicines, herbs, non-prescription drugs, or dietary supplements you use. Also tell them if you smoke, drink alcohol, or use illegal drugs. Some items may interact with your medicine. What should I watch for while using this medicine? Visit your healthcare professional for regular checks on your progress. Tell your healthcare professional if your symptoms do not start to get better or if they get worse. You may get drowsy or dizzy. Do not drive, use machinery, or do anything that needs mental alertness until you know how this medicine affects you. Do not stand  up or sit up quickly, especially if you are an older patient. This reduces the risk of dizzy or fainting spells. Alcohol may interfere with the effect of this medicine. Your mouth may get dry. Chewing sugarless gum or sucking hard candy and drinking plenty of water may help. Contact your healthcare professional if the problem does not go away or is severe. If you take migraine medicines for 10 or more days a month, your migraines may get worse. Keep a diary of headache days and medicine use. Contact your healthcare professional if your migraine attacks occur more frequently. What side effects may I notice from receiving this medicine? Side effects that you should report to your doctor or health care professional as soon as possible:  allergic reactions like skin rash, itching or hives, swelling of the face, lips, or tongue  chest pain or chest tightness  signs and symptoms of a dangerous change in heartbeat or heart rhythm like chest pain; dizziness; fast, irregular heartbeat; palpitations; feeling faint or lightheaded; falls; breathing problems  signs and symptoms of a stroke like changes in vision; confusion; trouble speaking or understanding; severe headaches; sudden numbness or weakness of the face, arm or leg; trouble walking; dizziness; loss of balance or coordination  signs and symptoms of serotonin syndrome like irritable; confusion; diarrhea; fast or irregular heartbeat; muscle twitching; stiff muscles; trouble walking; sweating; high fever; seizures; chills; vomiting Side effects that usually do not require medical attention (report to your doctor or health care professional if they continue or are bothersome):  diarrhea  dizziness  drowsiness  dry mouth  headache  nausea, vomiting  pain, tingling, numbness in the hands or feet  stomach pain This list may not describe all possible side effects. Call your doctor for medical advice about side effects. You may report side  effects to FDA at 1-800-FDA-1088. Where should I keep my medicine? Keep out of the reach of children. Store at room temperature between 15 and 30 degrees C (59 and 86 degrees F). Protect from light and moisture. Throw away any unused medicine after the expiration date. NOTE: This sheet is a summary. It may not cover all possible information. If you have questions about this medicine, talk to your doctor, pharmacist, or health care provider.  2021 Elsevier/Gold Standard (2018-01-31 14:58:08) Topiramate tablets What is this medicine? TOPIRAMATE (toe PYRE a mate) is used to treat seizures in adults or children with epilepsy. It is also used for the prevention of migraine headaches. This medicine may be used for other purposes; ask your health care provider or pharmacist if you have questions. COMMON BRAND NAME(S): Topamax, Topiragen What should I tell my health care provider before I take this medicine? They need to know if you have any of these conditions:  bleeding disorder  kidney disease  lung disease  suicidal thoughts, plans, or attempt  an unusual or allergic reaction to topiramate, other medicines, foods, dyes, or preservatives  pregnant or trying to get pregnant  breast-feeding How should I use this medicine? Take this medicine by mouth with a glass of water. Follow the directions on the prescription label. Do not cut, crush or chew this medicine. Swallow the tablets whole. You can take it with or without food. If it upsets your stomach, take it with food. Take your medicine at regular intervals. Do not take it more often than directed. Do not stop taking except on your doctor's advice. A special MedGuide will be given to you by the pharmacist with each prescription and refill. Be sure to read this information carefully each time. Talk to your pediatrician regarding the use of this medicine in children. While this drug may be prescribed for children as young as 69 years of age for  selected conditions, precautions do apply. Overdosage: If you think you have taken too much of this medicine contact a poison control center or emergency room at once. NOTE: This medicine is only for you. Do not share this medicine with others. What if I miss a dose? If you miss a dose, take it as soon as you can. If your next dose is to be taken in less than 6 hours, then do not take the missed dose. Take the next dose at your regular time. Do not take double or extra doses. What may interact with this medicine? This medicine may interact with the following medications:  acetazolamide  alcohol  antihistamines for allergy, cough, and cold  aspirin and aspirin-like medicines  atropine  birth control pills  certain medicines for anxiety or sleep  certain medicines for bladder problems like oxybutynin, tolterodine  certain medicines for depression like amitriptyline, fluoxetine, sertraline  certain medicines for seizures like carbamazepine, phenobarbital, phenytoin, primidone, valproic acid, zonisamide  certain medicines for stomach problems like dicyclomine, hyoscyamine  certain medicines for travel sickness like scopolamine  certain medicines for Parkinson's disease like benztropine, trihexyphenidyl  certain medicines that treat or prevent blood clots like warfarin, enoxaparin, dalteparin, apixaban, dabigatran, and rivaroxaban  digoxin  general anesthetics like halothane, isoflurane, methoxyflurane, propofol  hydrochlorothiazide  ipratropium  lithium  medicines that relax muscles for surgery  metformin  narcotic medicines for pain  NSAIDs, medicines for pain and inflammation, like ibuprofen or naproxen  phenothiazines like chlorpromazine, mesoridazine, prochlorperazine, thioridazine  pioglitazone This list may not describe all possible interactions. Give your health care provider a list of all the medicines, herbs, non-prescription drugs, or dietary supplements  you use. Also tell them if you smoke, drink alcohol, or use illegal drugs. Some items may interact with your medicine. What should I watch for while using this medicine? Visit your doctor or health care professional for regular checks on your progress. Tell your health care professional if your symptoms do not start to get better or if they get worse. Do not stop taking except on your health care professional's advice. You may develop a severe reaction. Your health care professional will tell you how much medicine to take. Wear a medical ID bracelet or chain. Carry a card that describes your disease and details of your medicine and dosage times. This medicine can reduce the response of your body to heat or cold. Dress warm in cold weather and stay hydrated in hot weather. If possible, avoid extreme temperatures like saunas, hot tubs, very hot or cold showers, or activities that can cause dehydration such as vigorous exercise. Check with your health care professional if you have severe diarrhea, nausea, and vomiting, or  if you sweat a lot. The loss of too much body fluid may make it dangerous for you to take this medicine. You may get drowsy or dizzy. Do not drive, use machinery, or do anything that needs mental alertness until you know how this medicine affects you. Do not stand up or sit up quickly, especially if you are an older patient. This reduces the risk of dizzy or fainting spells. Alcohol may interfere with the effect of this medicine. Avoid alcoholic drinks. Tell your health care professional right away if you have any change in your eyesight. Patients and their families should watch out for new or worsening depression or thoughts of suicide. Also watch out for sudden changes in feelings such as feeling anxious, agitated, panicky, irritable, hostile, aggressive, impulsive, severely restless, overly excited and hyperactive, or not being able to sleep. If this happens, especially at the beginning of  treatment or after a change in dose, call your healthcare professional. This medicine may cause serious skin reactions. They can happen weeks to months after starting the medicine. Contact your health care provider right away if you notice fevers or flu-like symptoms with a rash. The rash may be red or purple and then turn into blisters or peeling of the skin. Or, you might notice a red rash with swelling of the face, lips or lymph nodes in your neck or under your arms. Birth control may not work properly while you are taking this medicine. Talk to your health care professional about using an extra method of birth control. Women should inform their health care professional if they wish to become pregnant or think they might be pregnant. There is a potential for serious side effects and harm to an unborn child. Talk to your health care professional for more information. What side effects may I notice from receiving this medicine? Side effects that you should report to your doctor or health care professional as soon as possible:  allergic reactions like skin rash, itching or hives, swelling of the face, lips, or tongue  blood in the urine  changes in vision  confusion  loss of memory  pain in lower back or side  pain when urinating  redness, blistering, peeling or loosening of the skin, including inside the mouth  signs and symptoms of bleeding such as bloody or black, tarry stools; red or dark brown urine; spitting up blood or brown material that looks like coffee grounds; red spots on the skin; unusual bruising or bleeding from the eyes, gums, or nose  signs and symptoms of increased acid in the body like breathing fast; fast heartbeat; headache; confusion; unusually weak or tired; nausea, vomiting  suicidal thoughts, mood changes  trouble speaking or understanding  unusual sweating  unusually weak or tired Side effects that usually do not require medical attention (report to your  doctor or health care professional if they continue or are bothersome):  dizziness  drowsiness  fever  loss of appetite  nausea, vomiting  pain, tingling, numbness in the hands or feet  stomach pain  tiredness  upset stomach This list may not describe all possible side effects. Call your doctor for medical advice about side effects. You may report side effects to FDA at 1-800-FDA-1088. Where should I keep my medicine? Keep out of the reach of children and pets. Store between 15 and 30 degrees C (59 and 86 degrees F). Protect from moisture. Keep the container tightly closed. Get rid of any unused medicine after the expiration date.  To get rid of medicines that are no longer needed or have expired:  Take the medicine to a medicine take-back program. Check with your pharmacy or law enforcement to find a location.  If you cannot return the medicine, check the label or package insert to see if the medicine should be thrown out in the garbage or flushed down the toilet. If you are not sure, ask your health care provider. If it is safe to put it in the trash, empty the medicine out of the container. Mix the medicine with cat litter, dirt, coffee grounds, or other unwanted substance. Seal the mixture in a bag or container. Put it in the trash. NOTE: This sheet is a summary. It may not cover all possible information. If you have questions about this medicine, talk to your doctor, pharmacist, or health care provider.  2021 Elsevier/Gold Standard (2020-01-31 15:41:57)  Discussed:  There is increased risk for stroke in women with migraine with aura and a contraindication for the combined contraceptive pill for use by women who have migraine with aura. The risk for women with migraine without aura is lower. However other risk factors like smoking are far more likely to increase stroke risk than migraine. There is a recommendation for no smoking and for the use of OCPs without estrogen such as  progestogen only pills particularly for women with migraine with aura.Marland Kitchen People who have migraine headaches with auras may be 3 times more likely to have a stroke caused by a blood clot, compared to migraine patients who don't see auras. Women who take hormone-replacement therapy may be 30 percent more likely to suffer a clot-based stroke than women not taking medication containing estrogen. Other risk factors like smoking and high blood pressure may be  much more important.

## 2020-10-29 NOTE — Addendum Note (Signed)
Addended by: Bertram Savin on: 10/29/2020 10:12 AM   Modules accepted: Orders

## 2020-10-29 NOTE — Telephone Encounter (Signed)
[facility]: [id number] (exp. 10/29/20 to 11/27/20) order sent to GI. They will reach out to the patient to schedule.

## 2020-10-29 NOTE — Progress Notes (Signed)
Patient was given 60 mg of IM Toradol in RUOQ of  right buttock.  Patient tolerated well.  Band-Aid applied and aseptic technique maintained.  See MAR.

## 2020-11-05 NOTE — Progress Notes (Signed)
[facility] Telemedicine Visit  Patient consented to have virtual visit and was identified by name and date of birth. Method of visit: Video  Encounter participants: Patient: Sabrina Brennan - located at home Provider: Dana Allan - located at home Others (if applicable): n/a  Chief Complaint: possible sinus infection  HPI:  Reports infection for 2 days.  Associated symptoms include postnasal drainage, sneezing, clear nasal discharge and nasal pressure, itchy and puffy eyes,dry cough, body aches.  Denies any fevers, shortness of breath or difficulty breathing.  No recent sick contacts.  Took Zyrtec today but does not take this regularly.  ROS: per HPI  Pertinent PMHx:  Seasonal allergies Migraines  Exam:  There were no vitals taken for this visit.  Respiratory: No SOB or IWOB noted, speaking in full sentences  Assessment/Plan:  Allergic rhinosinusitis Low suspicion for bacterial/viral causes given no fevers. Likely symptoms related to seasonal allergies given itching, puffy eyes and nasal drainage/congestion -Start  Flonase 50 mcg 2 sprays to each nostril BID x 14/7 -Zyrtec 10 mg daily, especially during allergy season -Strict return precautions -Follow up as needed if symptoms no better     Time spent during visit with patient: 15 minutes

## 2020-11-06 ENCOUNTER — Telehealth (INDEPENDENT_AMBULATORY_CARE_PROVIDER_SITE_OTHER): Payer: 59 | Admitting: Family Medicine

## 2020-11-06 ENCOUNTER — Encounter: Payer: Self-pay | Admitting: Family Medicine

## 2020-11-06 DIAGNOSIS — J301 Allergic rhinitis due to pollen: Secondary | ICD-10-CM

## 2020-11-06 DIAGNOSIS — J309 Allergic rhinitis, unspecified: Secondary | ICD-10-CM | POA: Insufficient documentation

## 2020-11-06 MED ORDER — FLUTICASONE PROPIONATE 50 MCG/ACT NA SUSP: 2.0000 | Freq: Every day | NASAL | 0 refills | Status: DC

## 2020-11-06 MED ORDER — CETIRIZINE HCL 10 MG PO TABS: 10.0000 mg | ORAL_TABLET | Freq: Every day | ORAL | 11 refills | Status: DC

## 2020-11-06 NOTE — Assessment & Plan Note (Signed)
Low suspicion for bacterial/viral causes given no fevers. Likely symptoms related to seasonal allergies given itching, puffy eyes and nasal drainage/congestion -Start  Flonase 50 mcg 2 sprays to each nostril BID x 14/7 -Zyrtec 10 mg daily, especially during allergy season -Strict return precautions -Follow up as needed if symptoms no better

## 2020-11-07 ENCOUNTER — Encounter: Payer: Self-pay | Admitting: Family Medicine

## 2020-11-07 ENCOUNTER — Telehealth: Payer: Self-pay | Admitting: Family Medicine

## 2020-11-07 NOTE — Telephone Encounter (Signed)
Returned patient call.  Symptom remain unchanged.  No fevers.  Has been using flonase and zyrtec since last visit.  Discussed COVID testing, patient has home test and will complete. She will need to self isolate until results and continue if positive per CDC guidelines.  Letter for work provided  Dana Allan, MD [facility]

## 2020-11-15 ENCOUNTER — Other Ambulatory Visit: Payer: Self-pay

## 2020-11-15 ENCOUNTER — Ambulatory Visit
Admission: RE | Admit: 2020-11-15 | Discharge: 2020-11-15 | Disposition: A | Discharge status: Home / Self Care | Payer: Medicaid Other | Source: Ambulatory Visit | Attending: Neurology | Admitting: Neurology

## 2020-11-15 DIAGNOSIS — R519 Headache, unspecified: Secondary | ICD-10-CM

## 2020-11-15 DIAGNOSIS — R51 Headache with orthostatic component, not elsewhere classified: Secondary | ICD-10-CM

## 2020-11-15 DIAGNOSIS — R200 Anesthesia of skin: Secondary | ICD-10-CM

## 2020-11-15 DIAGNOSIS — R531 Weakness: Secondary | ICD-10-CM

## 2020-11-15 DIAGNOSIS — H547 Unspecified visual loss: Secondary | ICD-10-CM

## 2020-11-15 MED ORDER — GADOBENATE DIMEGLUMINE 529 MG/ML IV SOLN
15.0000 mL | Freq: Once | INTRAVENOUS | Status: AC | PRN
  Administered 2020-11-15: 15 mL via INTRAVENOUS

## 2020-11-21 ENCOUNTER — Ambulatory Visit (INDEPENDENT_AMBULATORY_CARE_PROVIDER_SITE_OTHER): Payer: Medicaid Other

## 2020-11-21 ENCOUNTER — Telehealth: Payer: Self-pay

## 2020-11-21 ENCOUNTER — Other Ambulatory Visit: Payer: Self-pay

## 2020-11-21 DIAGNOSIS — Z3042 Encounter for surveillance of injectable contraceptive: Secondary | ICD-10-CM

## 2020-11-21 NOTE — Progress Notes (Signed)
Patient here today for Depo Provera injection and is within her dates.    Last contraceptive appt was 08/26/2020  Depo given in LUOQ today.  Site unremarkable & patient tolerated injection.    Next injection due 7/8-7/22.  Reminder card given.    Veronda Prude, RN

## 2020-11-21 NOTE — Telephone Encounter (Signed)
Patient presented to nurse clinic today for depo injection. Patient was told when she scheduled that she could also discuss concerns with eczema flair up. Informed patient that we did not have any provider availability for today. Proceeded with Depo injection, advised patient that I would send message to provider for request for eczema medication.   Offered to schedule appointment with provider, however, patient was not able to schedule today. 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   Patient reports that she had previously been prescribed diflucan for eczema, as previous doctor told her she had yeast under her skin.   Patient can be reached by phone or mychart.   Please advise.   Veronda Prude, RN

## 2020-11-23 NOTE — Telephone Encounter (Signed)
Hey, Fluconazole is used for candida infections and not eczema.  If she thinks it's a flare up of her eczema, this medication will not help and she will need a different treatment.  Without assessing the area it's difficult to know which to prescribe so I recommend that she make an appointment to be seen.  She can book a virtual appointment if that works for her.    Thanks  Kenney Houseman

## 2020-11-25 NOTE — Telephone Encounter (Signed)
Sent patient mychart message informing her of below. (She requested this method of communication at last visit)  Veronda Prude, RN

## 2020-11-26 ENCOUNTER — Other Ambulatory Visit: Payer: Self-pay

## 2020-11-26 ENCOUNTER — Ambulatory Visit (INDEPENDENT_AMBULATORY_CARE_PROVIDER_SITE_OTHER): Payer: 59 | Admitting: Family Medicine

## 2020-11-26 ENCOUNTER — Ambulatory Visit: Payer: Medicaid Other

## 2020-11-26 ENCOUNTER — Encounter: Payer: Self-pay | Admitting: Family Medicine

## 2020-11-26 VITALS — BP 122/84 | HR 75 | Wt 157.0 lb

## 2020-11-26 DIAGNOSIS — B360 Pityriasis versicolor: Secondary | ICD-10-CM | POA: Diagnosis not present

## 2020-11-26 DIAGNOSIS — L299 Pruritus, unspecified: Secondary | ICD-10-CM | POA: Diagnosis not present

## 2020-11-26 LAB — POCT SKIN KOH: Skin KOH, POC: POSITIVE — AB

## 2020-11-26 MED ORDER — SELENIUM SULFIDE 2.25 % EX SHAM: 1.0000 "application " | MEDICATED_SHAMPOO | Freq: Every day | CUTANEOUS | 0 refills | Status: DC

## 2020-11-26 NOTE — Progress Notes (Signed)
     SUBJECTIVE:   CHIEF COMPLAINT / HPI:   Sabrina Brennan is a 30 y.o. female presents for eczema   Eczema  Pt reports symptoms have been going on for 1 month. Symptoms are in side of back. It then spread to the left antecubital fossa.  Pt has not tried anything for the rashes. Treated with diflucan several years ago which cleared up eczema. At that time the rash it was all over the body.   Flowsheet Row Office Visit from 09/25/2020 in [facility]  PHQ-9 Total Score 0       Health Maintenance Due  Topic  . COVID-19 Vaccine (3 - Booster for [employer] series)     PERTINENT  PMH / PSH: allergic rhinitis, eczema   OBJECTIVE:   BP 122/84   Pulse 75   Wt 157 lb (71.2 kg)   SpO2 98%   BMI 31.71 kg/m    General: Alert, no acute distress Cardio: Well-perfused Pulm:  normal work of breathing Neuro: Cranial nerves grossly intact  Skin: Hyperpigmented, patchy, irregular shaped scaly lesions over lateral aspect of back bilaterally with normal skin outside of the lesions. No obvious eczematoid rash on right antecubital fossa however 3 x round hyperpigmented lesions noted in this area.  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  Skin KOH test positive for fungal infection. Considered eczema as pt has an history of this however less likely based on appearance of hyperpigmented lesions and classic distribution on the back.  Also considered psoriasis however much less likely given positive skin KOH test. Prescribed selenium sulfide shampoo and recommended applying to affected area for 15 minutes a day and then washing off and continuing this until rash clears.  Recommended follow-up with PCP if persistent symptoms.     Towanda Octave, MD PGY-2 [facility]

## 2020-11-26 NOTE — Patient Instructions (Signed)
Thank you for coming to see me today. It was a pleasure. Today we discussed your rash. It is probably a skin fungal infection. I recommend applying selsun blue to the affected area daily. We do not need to use steroid creams right now as I do not think it is eczema.   Please follow-up with your PCP if no improvement in symptoms   If you have any questions or concerns, please do not hesitate to call the office at [phone].  Best wishes,   Dr Allena Katz

## 2020-11-26 NOTE — Assessment & Plan Note (Addendum)
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  Skin KOH test positive for fungal infection. Considered eczema as pt has an history of this however less likely based on appearance of hyperpigmented lesions and classic distribution on the back.  Also considered psoriasis however much less likely given positive skin KOH test. Prescribed selenium sulfide shampoo and recommended applying to affected area for 15 minutes a day and then washing off and continuing this until rash clears.  Recommended follow-up with PCP if persistent symptoms.

## 2021-01-06 ENCOUNTER — Telehealth: Payer: 59 | Admitting: Physician Assistant

## 2021-01-06 ENCOUNTER — Encounter: Payer: Self-pay | Admitting: Physician Assistant

## 2021-01-06 DIAGNOSIS — H01005 Unspecified blepharitis left lower eyelid: Secondary | ICD-10-CM

## 2021-01-06 MED ORDER — POLYMYXIN B-TRIMETHOPRIM 10000-0.1 UNIT/ML-% OP SOLN: OPHTHALMIC | 0 refills | Status: DC

## 2021-01-06 NOTE — Patient Instructions (Signed)
Instructions sent to patients MyChart.

## 2021-01-06 NOTE — Progress Notes (Signed)
Sabrina Brennan, Sabrina Brennan are scheduled for a virtual visit with your provider today.    Just as we do with appointments in the office, we must obtain your consent to participate.  Your consent will be active for this visit and any virtual visit you may have with one of our providers in the next 365 days.    If you have a MyChart account, I can also send a copy of this consent to you electronically.  All virtual visits are billed to your insurance company just like a traditional visit in the office.  As this is a virtual visit, video technology does not allow for your provider to perform a traditional examination.  This may limit your provider's ability to fully assess your condition.  If your provider identifies any concerns that need to be evaluated in person or the need to arrange testing such as labs, EKG, etc, we will make arrangements to do so.    Although advances in technology are sophisticated, we cannot ensure that it will always work on either your end or our end.  If the connection with a video visit is poor, we may have to switch to a telephone visit.  With either a video or telephone visit, we are not always able to ensure that we have a secure connection.   I need to obtain your verbal consent now.   Are you willing to proceed with your visit today?   Sabrina Brennan has provided verbal consent on 01/06/2021 for a virtual visit (video or telephone).  Sabrina Climes, PA-C 01/06/2021  9:08 AM   Virtual Visit via Video   I connected with patient on 01/06/21 at  9:15 AM EDT by a video enabled telemedicine application and verified that I am speaking with the correct person using two identifiers.  Location patient: Home Location provider: [facility] Persons participating in the virtual visit: Patient, Provider  I discussed the limitations of evaluation and management by telemedicine and the availability of in person appointments. The patient expressed understanding and agreed  to proceed.  Subjective:   HPI:  Patient presents via [facility] today complaining of two days of pain and swelling of her left lower eyelid.  Denies any trauma or injury to the area.  Denies any eye pain or conjunctival injection.  Denies any acute vision changes.  Patient states she has a history of substantial seasonal allergies and initially thought she was getting mild eye irritation from that.  However she can now feel the formation of a small stye in her lower eyelid.  Pain and tenderness along with redness of lower eyelid has increased in the past 24 hours.  Denies fever, chills, malaise or fatigue.  ROS:   See pertinent positives and negatives per HPI.  Patient Active Problem List   Diagnosis Date Noted  . Pityriasis versicolor 11/26/2020  . 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  . 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 Chronic migraine without aura without status migrainosus, not intractable 10/29/2020  . Chronic migraine with aura 10/29/2020  . Acute viral disease 09/25/2020  . History of abnormal cervical Pap smear 08/28/2020  . Acute asthma exacerbation 08/03/2020  . Left ear pain 07/22/2020  . Migraine headache without aura 07/03/2020  . Right lower quadrant abdominal pain 05/01/2020  . Constipation 04/21/2020  . 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 Breast mass in female 08/29/2013    Social History  Tobacco Use  . Smoking status: Light Tobacco Smoker  . Smokeless tobacco: Never Used  . Tobacco comment: Hooka 2x monthly  Substance Use Topics  . Alcohol use: Yes    Alcohol/week: 4.0 - 5.0 standard drinks    Types: 4 - 5 Glasses of wine per week    Current Outpatient Medications:  .  cetirizine (ZYRTEC) 10 MG tablet, Take 1 tablet (10 mg total) by mouth daily., Disp: 30 tablet, Rfl: 11 .  fluticasone (FLONASE) 50 MCG/ACT nasal spray, Place 2 sprays into both  nostrils daily for 14 days., Disp: 16 g, Rfl: 0 .  meclizine (ANTIVERT) 12.5 MG tablet, Take 1 tablet (12.5 mg total) by mouth 2 (two) times daily as needed for dizziness., Disp: 30 tablet, Rfl: 0 .  medroxyPROGESTERone (DEPO-PROVERA) 150 MG/ML injection, Inject 150 mg into the muscle every 3 (three) months., Disp: , Rfl:  .  ondansetron (ZOFRAN-ODT) 4 MG disintegrating tablet, Take 1-2 tablets (4-8 mg total) by mouth every 8 (eight) hours as needed for nausea., Disp: 30 tablet, Rfl: 3 .  propranolol (INDERAL) 20 MG tablet, Take 1 tablet (20 mg total) by mouth 2 (two) times daily., Disp: 60 tablet, Rfl: 0 .  rizatriptan (MAXALT-MLT) 10 MG disintegrating tablet, Take 1 tablet (10 mg total) by mouth as needed for migraine. May repeat in 2 hours if needed, Disp: 9 tablet, Rfl: 11 .  Selenium Sulfide 2.25 % SHAM, Apply 1 application topically daily., Disp: 180 mL, Rfl: 0 .  SUMAtriptan (IMITREX) 50 MG tablet, Take 1 tablet (50 mg total) by mouth every 2 (two) hours as needed for migraine. May repeat in 2 hours if headache persists or recurs., Disp: 10 tablet, Rfl: 0 .  topiramate (TOPAMAX) 50 MG tablet, Start with one pill at bedtime and in 2 weeks increase to 2 pills at bedtime, Disp: 60 tablet, Rfl: 6  Current Facility-Administered Medications:  .  medroxyPROGESTERone Acetate SUSY 150 mg, 150 mg, Intramuscular, Q90 days, Janit Pagan T, MD, 150 mg at 11/21/20 0944  No Known Allergies  Objective:   There were no vitals taken for this visit.  Patient is well-developed, well-nourished in no acute distress.  Resting comfortably at home.  Head is normocephalic, atraumatic.  No labored breathing.  Speech is clear and coherent with logical content.  Patient is alert and oriented at baseline.  Left lower eyelid with stye noted even on video. Also diffuse lower eyelid redness and swelling, concerning for blepharitis.   Assessment and Plan:   1. Blepharitis of left lower eyelid, unspecified  type Mild. With stye formation. Start warm compresses. Alternate tylenol and ibuprofen. Rx polytrim OP to apply as directed. Strict follow-up/UC/ER precautions reviewed with patient who voiced understanding and agreement with the plan.   - trimethoprim-polymyxin b (POLYTRIM) ophthalmic solution; Apply 1-2 drops into affected eye QID x 5 days.  Dispense: 10 mL; Refill: 0   Sabrina Climes, PA-C 01/06/2021

## 2021-01-08 ENCOUNTER — Other Ambulatory Visit: Payer: Self-pay

## 2021-01-09 ENCOUNTER — Encounter: Payer: Self-pay | Admitting: Family Medicine

## 2021-01-09 ENCOUNTER — Ambulatory Visit (INDEPENDENT_AMBULATORY_CARE_PROVIDER_SITE_OTHER): Payer: 59 | Admitting: Family Medicine

## 2021-01-09 ENCOUNTER — Other Ambulatory Visit: Payer: Self-pay

## 2021-01-09 VITALS — BP 121/80 | HR 92 | Ht 59.0 in | Wt 161.2 lb

## 2021-01-09 DIAGNOSIS — B354 Tinea corporis: Secondary | ICD-10-CM | POA: Diagnosis not present

## 2021-01-09 DIAGNOSIS — B360 Pityriasis versicolor: Secondary | ICD-10-CM

## 2021-01-09 MED ORDER — SELENIUM SULFIDE 2.25 % EX SHAM
1.0000 | MEDICATED_SHAMPOO | Freq: Every day | CUTANEOUS | 3 refills | Status: DC
Start: 2021-01-09 — End: 2021-06-01

## 2021-01-09 MED ORDER — FLUCONAZOLE 150 MG PO TABS: 150.0000 mg | ORAL_TABLET | Freq: Once | ORAL | 0 refills | Status: AC

## 2021-01-09 MED ORDER — KETOCONAZOLE 2 % EX CREA: 1.0000 "application " | TOPICAL_CREAM | Freq: Two times a day (BID) | CUTANEOUS | 1 refills | Status: DC

## 2021-01-09 NOTE — Patient Instructions (Signed)
It was a pleasure to see you today!  For your rash: please take diflucan 1 pill once this week, and then take second dose (1 pill) once next week.  2. You can also use ketoconazole cream twice daily on affected areas for up to 2 weeks, then stop. You can use this on small spots that may return.  3. To help prevent return of fungus, I recommend using the selsyn blue shampoo once a week (especially during hot weather). Place on skin for 10 minutes, then rinse off.   Be Well,  Dr. Carney [place] Versicolor  Tinea versicolor is a skin infection. It is caused by a type of yeast. It is normal for some yeast to be on your skin, but too much yeast causes thisinfection. The infection causes a rash of light or dark patches on your skin. The rash is most common on the chest, back, neck, or upper arms. The infection usually does not cause other problems. If it is treated, it will probably go away in a few weeks. The infection cannot be spread from one person to another (is not contagious). Follow these instructions at home: Use over-the-counter and prescription medicines only as told by your doctor. Scrub your skin every day with dandruff shampoo as told by your doctor. Do not scratch your skin in the rash area. Avoid places that are hot and humid. Do not use tanning booths. Try to avoid sweating a lot. Contact a doctor if: Your symptoms get worse. You have a fever. You have redness, swelling, or pain in the rash area. You have fluid or blood coming from your rash. Your rash feels warm to the touch. You have pus or a bad smell coming from your rash. Your rash comes back (recurs) after treatment. Summary Tinea versicolor is a skin infection. It causes a rash of light or dark patches on your skin. The rash is most common on the chest, back, neck, or upper arms. This infection usually does not cause other problems. Use over-the-counter and prescription medicines only as told by your doctor. If  the infection is treated, it will probably go away in a few weeks. This information is not intended to replace advice given to you by your health care provider. Make sure you discuss any questions you have with your healthcare provider. Document Revised: 05/13/2020 Document Reviewed: 05/14/2020 Elsevier Patient Education  2022 [employer].

## 2021-01-09 NOTE — Progress Notes (Signed)
    SUBJECTIVE:   CHIEF COMPLAINT / HPI: rash  Rash: patient reports that she has "eczema" and history of it. She has noticed pruritic, hyperpigmented patches appearing all over her trunk, thigh, on her AC fossae, and behind knee. She saw Dr. Allena Katz in April this year and had a positive skin KOH for fungal products. She tried selsyn blue shampoo, without improvement. She had a similar episode about 6 years ago and was treated with diflucan, which completely cleared the rash, she is interested in repeating this treatment.  PERTINENT  PMH / PSH: non-contributory  OBJECTIVE:   BP 121/80   Pulse 92   Ht 4\' 11"  (1.499 m)   Wt 161 lb 3.2 oz (73.1 kg)   SpO2 98%   BMI 32.56 kg/m   Nursing note and vitals reviewed GEN: age-appropriate, AAW, resting comfortably in chair, NAD, WNWD Skin: 69mm-2cm hyperpigmented patches spread diffusely across trunk, inner right thigh, and b/l AC fossae Neuro: alert and at baseline Psych: pleasant and appropriate   ASSESSMENT/PLAN:   Pityriasis versicolor Patient with h/o similar rash, positive skin KOH in April. Failed selsyn blue shampoo treatment. Will try diflucan with 150 mg 1 dose this week, followed by one other dose next week. Also will give ketoconazole 2% cr for small lesions that appear after this. Recommend using selsyn blue shampoo as ppx once a week during humid weather to help prevent recurrence.     May, MD [facility]

## 2021-01-09 NOTE — Assessment & Plan Note (Signed)
Patient with h/o similar rash, positive skin KOH in April. Failed selsyn blue shampoo treatment. Will try diflucan with 150 mg 1 dose this week, followed by one other dose next week. Also will give ketoconazole 2% cr for small lesions that appear after this. Recommend using selsyn blue shampoo as ppx once a week during humid weather to help prevent recurrence.

## 2021-02-06 ENCOUNTER — Other Ambulatory Visit: Payer: Self-pay

## 2021-02-06 ENCOUNTER — Ambulatory Visit: Payer: Medicaid Other

## 2021-02-06 DIAGNOSIS — Z30019 Encounter for initial prescription of contraceptives, unspecified: Secondary | ICD-10-CM

## 2021-02-06 MED ORDER — MEDROXYPROGESTERONE ACETATE 150 MG/ML IM SUSP
150.0000 mg | Freq: Once | INTRAMUSCULAR | Status: AC
Start: 2021-02-06 — End: 2022-04-12
  Administered 2022-04-12: 150 mg via INTRAMUSCULAR

## 2021-02-06 NOTE — Progress Notes (Signed)
Patient here today for Depo Provera injection and is within her dates.     Last contraceptive appt was 08/26/2020.   Depo given in RUOQ today. Site unremarkable & patient tolerated injection.     Next injection due 04/24/2021-05/08/2021. Reminder card given.

## 2021-03-06 ENCOUNTER — Telehealth: Payer: Self-pay | Admitting: Family Medicine

## 2021-03-06 NOTE — Telephone Encounter (Signed)
I spoke with patient and she didn't get scheduled for a 3 month follow up with [facility], so when she called to make an appt they couldn't get her in until 06/2021.  Patient is unable to weight that long due to her medication regimen not working.  She is agreeable to see if [facility] is able to get her in sooner.  Will ask that MD place a new referral.  Patient also made a ATC appt next Monday to see about getting some immediate relief from her migraines.  She will seek care at the urgent care if she doesn't think she can make it until then or symptoms worsen.  She voiced understanding and is agreeable with the plan.  Siri Buege,[name]

## 2021-03-06 NOTE — Telephone Encounter (Signed)
Patient called and said that she was referred to for her migraines is booked out for a while, and she is needing to know if there is another place that she could be sent to to be seen earlier because her migraines have gotten worst. Please advise. Thanks!

## 2021-03-09 ENCOUNTER — Other Ambulatory Visit: Payer: Self-pay

## 2021-03-09 ENCOUNTER — Ambulatory Visit (INDEPENDENT_AMBULATORY_CARE_PROVIDER_SITE_OTHER): Payer: 59 | Admitting: Family Medicine

## 2021-03-09 VITALS — BP 98/72 | HR 95 | Ht 59.0 in | Wt 166.2 lb

## 2021-03-09 DIAGNOSIS — G43109 Migraine with aura, not intractable, without status migrainosus: Secondary | ICD-10-CM | POA: Diagnosis not present

## 2021-03-09 DIAGNOSIS — G43119 Migraine with aura, intractable, without status migrainosus: Secondary | ICD-10-CM

## 2021-03-09 DIAGNOSIS — H547 Unspecified visual loss: Secondary | ICD-10-CM | POA: Diagnosis not present

## 2021-03-09 MED ORDER — SUMATRIPTAN SUCCINATE 100 MG PO TABS: 100.0000 mg | ORAL_TABLET | ORAL | 3 refills | Status: DC | PRN

## 2021-03-09 MED ORDER — NAPROXEN 500 MG PO TABS: 500.0000 mg | ORAL_TABLET | Freq: Two times a day (BID) | ORAL | 0 refills | Status: AC

## 2021-03-09 MED ORDER — PROCHLORPERAZINE MALEATE 5 MG PO TABS: 5.0000 mg | ORAL_TABLET | Freq: Four times a day (QID) | ORAL | 0 refills | Status: DC | PRN

## 2021-03-09 MED ORDER — KETOROLAC TROMETHAMINE 30 MG/ML IJ SOLN: 30.0000 mg | Freq: Once | INTRAMUSCULAR | Status: DC

## 2021-03-09 NOTE — Assessment & Plan Note (Addendum)
Chronic migraines, uncontrolled.  Used shared decision making with patient today.  Stopped topiramate and propanolol as they are ineffective in this patient.  Offered abortive therapies: Sumatriptan 100 mg, naproxen 500 mg twice daily and Compazine as needed which patient was happy with.  Patient referral to [facility] neurology.   Recommended pt drives with caution when she has migraines, blurred vision etc. Follow-up with PCP as needed.

## 2021-03-09 NOTE — Progress Notes (Signed)
     SUBJECTIVE:   CHIEF COMPLAINT / HPI:   Sabrina Brennan is a 30 y.o. female presents for chronic migraines  Chronic migraines with aura Patient reports that she has had migraines for several years. No obvious triggers. She gets them frequently-can get them everyday and they usually last 2-3 days. Sleeping helps but she often wakes up. 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 Fhx of migraines. Does get vision changes and aura with her migraines as well as blurred vision.  She can sometimes experience photophobia and phonophobia as well migraines have got much worse wants to try new medication.  She saw a neurologist in March 2022 who started her on topiramate and propanolol.  These medications have not helped much.  The neurologist was considering a monoclonal antibody medication if these medications were ineffective the next available appointment with them is in November however patient would like to change practices and see a different neurologist.  Other medications that patient has tried include sumatriptan.  Patient does not want to take multiple medications.   Flowsheet Row Office Visit from 03/09/2021 in [facility]  PHQ-9 Total Score 0       PERTINENT  PMH / PSH: migraines, asthma Use OBJECTIVE:   BP 98/72   Pulse 95   Ht 4\' 11"  (1.499 m)   Wt 166 lb 3.2 oz (75.4 kg)   SpO2 98%   BMI 33.57 kg/m    General: Alert, in mild distress due to headache, tired appearing,  HEENT: NCAT, PERRLA 60mm bilaterally  Cardio: Normal S1 and S2, RRR, no r/m/g Pulm: CTAB, normal work of breathing Abdomen: Bowel sounds normal. Abdomen soft and non-tender.  Extremities: No peripheral edema.  Neuro: cranial nerve exam normal, unable to assess for EOM as patient was experiencing blurred vision   Visual acuity:  Right 20/20 Left 20/25 Both 20/30 Patient does not wear glasses  ASSESSMENT/PLAN:   Chronic migraine with aura Chronic  migraines, uncontrolled.  Used shared decision making with patient today.  Stopped topiramate and propanolol as they are ineffective in this patient.  Offered abortive therapies: Sumatriptan 100 mg, naproxen 500 mg twice daily and Compazine as needed which patient was happy with.  Patient referral to [facility] neurology.   Recommended pt drives with caution when she has migraines, blurred vision etc. Follow-up with PCP as needed.   Visual acuity reduced Refer to ophthalmology for abnormal visual acuity.     [facility], MD PGY-3 [facility]

## 2021-03-09 NOTE — Assessment & Plan Note (Signed)
Refer to ophthalmology for abnormal visual acuity.

## 2021-03-09 NOTE — Patient Instructions (Addendum)
Thank you for coming to see me today. It was a pleasure. Today we discussed your migraines. I recommend: As needed Sumatriptan 100mg  Naproxen 500mg  twice daily  Compazine   Discontinue Propranolol and topamax.   Please follow-up with neurologist. Discuss whether you should take Emgality or Ajovy.  Address: [address], [place], [place] Hours:  Open ? Closes 4:30PM Confirmed by this business 11 weeks ago Phone: [phone]  If you have any questions or concerns, please do not hesitate to call the office at [phone].  Best wishes,   Dr [phone]

## 2021-05-06 ENCOUNTER — Ambulatory Visit (INDEPENDENT_AMBULATORY_CARE_PROVIDER_SITE_OTHER): Payer: BC Managed Care – PPO

## 2021-05-06 ENCOUNTER — Other Ambulatory Visit: Payer: Self-pay

## 2021-05-06 DIAGNOSIS — Z3042 Encounter for surveillance of injectable contraceptive: Secondary | ICD-10-CM | POA: Diagnosis not present

## 2021-05-06 DIAGNOSIS — Z30013 Encounter for initial prescription of injectable contraceptive: Secondary | ICD-10-CM

## 2021-05-06 NOTE — Progress Notes (Signed)
Patient here today for Depo Provera injection and is within her dates.    Last contraceptive appt was 08/2020  Depo given in LUOQ today.  Site unremarkable & patient tolerated injection.    Next injection due 12/21-08/05/2021.  Reminder card given.    Veronda Prude, RN

## 2021-06-01 ENCOUNTER — Telehealth: Payer: Self-pay | Admitting: Neurology

## 2021-06-01 ENCOUNTER — Encounter: Payer: Self-pay | Admitting: Neurology

## 2021-06-01 ENCOUNTER — Ambulatory Visit (INDEPENDENT_AMBULATORY_CARE_PROVIDER_SITE_OTHER): Payer: 59 | Admitting: Neurology

## 2021-06-01 ENCOUNTER — Other Ambulatory Visit: Payer: Self-pay

## 2021-06-01 VITALS — BP 120/84 | HR 74 | Ht 59.0 in | Wt 169.0 lb

## 2021-06-01 DIAGNOSIS — F439 Reaction to severe stress, unspecified: Secondary | ICD-10-CM

## 2021-06-01 DIAGNOSIS — F1029 Alcohol dependence with unspecified alcohol-induced disorder: Secondary | ICD-10-CM | POA: Diagnosis not present

## 2021-06-01 DIAGNOSIS — G43709 Chronic migraine without aura, not intractable, without status migrainosus: Secondary | ICD-10-CM | POA: Diagnosis not present

## 2021-06-01 MED ORDER — AJOVY 225 MG/1.5ML ~~LOC~~ SOAJ: 225.0000 mg | SUBCUTANEOUS | 11 refills | Status: DC

## 2021-06-01 MED ORDER — KETOROLAC TROMETHAMINE 60 MG/2ML IM SOLN
60.0000 mg | Freq: Once | INTRAMUSCULAR | Status: AC
  Administered 2021-06-01: 60 mg via INTRAMUSCULAR

## 2021-06-01 MED ORDER — NURTEC 75 MG PO TBDP: 75.0000 mg | ORAL_TABLET | Freq: Every day | ORAL | 6 refills | Status: DC | PRN

## 2021-06-01 NOTE — Patient Instructions (Addendum)
Stop topiramate Start Ajovy Monthly Nurtec as needed for headache once daily. If you like Nurtec, mychart me and I can prescribe. If you do not like Nurtec we will try Ubrelvy. Send me a mychart message Recommend going to see a substance abuse program.   [place] [facility] [id number]  [phone] Will also refer you to [place] tablets What is this medication? UBROGEPANT (ue BROE je pant) is used to treat migraine headaches with or without aura. An aura is a strange feeling or visual disturbance that warns you of an attack. It is not used to prevent migraines. This medicine may be used for other purposes; ask your health care provider or pharmacist if you have questions. COMMON BRAND NAME(S): Roselyn Meier What should I tell my care team before I take this medication? They need to know if you have any of these conditions: kidney disease liver disease an unusual or allergic reaction to ubrogepant, other medicines, foods, dyes, or preservatives pregnant or trying to get pregnant breast-feeding How should I use this medication? Take this medicine by mouth with a glass of water. Follow the directions on the prescription label. You can take it with or without food. If it upsets your stomach, take it with food. Take your medicine at regular intervals. Do not take it more often than directed. Do not stop taking except on your doctor's advice. Talk to your pediatrician about the use of this medicine in children. Special care may be needed. Overdosage: If you think you have taken too much of this medicine contact a poison control center or emergency room at once. NOTE: This medicine is only for you. Do not share this medicine with others. What if I miss a dose? This does not apply. This medicine is not for regular use. What may interact with this medication? Do not take this medicine with any of the following  medicines: ceritinib certain antibiotics like chloramphenicol, clarithromycin, telithromycin certain antivirals for HIV like atazanavir, cobicistat, darunavir, delavirdine, fosamprenavir, indinavir, ritonavir certain medicines for fungal infections like itraconazole, ketoconazole, posaconazole, voriconazole conivaptan grapefruit idelalisib mifepristone nefazodone ribociclib This medicine may also interact with the following medications: carvedilol certain medicines for seizures like phenobarbital, phenytoin ciprofloxacin cyclosporine eltrombopag fluconazole fluvoxamine quinidine rifampin [facility] John's wort verapamil This list may not describe all possible interactions. Give your health care provider a list of all the medicines, herbs, non-prescription drugs, or dietary supplements you use. Also tell them if you smoke, drink alcohol, or use illegal drugs. Some items may interact with your medicine. What should I watch for while using this medication? Visit your health care professional for regular checks on your progress. Tell your health care professional if your symptoms do not start to get better or if they get worse. Your mouth may get dry. Chewing sugarless gum or sucking hard candy and drinking plenty of water may help. Contact your health care professional if the problem does not go away or is severe. What side effects may I notice from receiving this medication? Side effects that you should report to your doctor or health care professional as soon as possible: allergic reactions like skin rash, itching or hives; swelling of the face, lips, or tongue Side effects that usually do not require medical attention (report these to your doctor or health care professional if they continue or are bothersome): drowsiness dry mouth nausea tiredness This list may not describe all possible side effects. Call your doctor for medical  advice about side effects. You may report side effects to  FDA at 1-800-FDA-1088. Where should I keep my medication? Keep out of the reach of children. Store at room temperature between 15 and 30 degrees C (59 and 86 degrees F). Throw away any unused medicine after the expiration date. NOTE: This sheet is a summary. It may not cover all possible information. If you have questions about this medicine, talk to your doctor, pharmacist, or health care provider.  2022 Else[employer]/Gold Standard (2018-10-05 08:50:55) Rimegepant oral dissolving tablet What is this medication? RIMEGEPANT (ri ME je pant) is used to treat migraine headaches with or without aura. An aura is a strange feeling or visual disturbance that warns you of an attack. It is also used to prevent migraine headaches. This medicine may be used for other purposes; ask your health care provider or pharmacist if you have questions. COMMON BRAND NAME(S): NURTEC ODT What should I tell my care team before I take this medication? They need to know if you have any of these conditions: kidney disease liver disease an unusual or allergic reaction to rimegepant, other medicines, foods, dyes, or preservatives pregnant or trying to get pregnant breast-feeding How should I use this medication? Take the medicine by mouth. Follow the directions on the prescription label. Leave the tablet in the sealed blister pack until you are ready to take it. With dry hands, open the blister and gently remove the tablet. If the tablet breaks or crumbles, throw it away and take a new tablet out of the blister pack. Place the tablet in the mouth and allow it to dissolve, and then swallow. Do not cut, crush, or chew this medicine. You do not need water to take this medicine. Talk to your pediatrician about the use of this medicine in children. Special care may be needed. Overdosage: If you think you have taken too much of this medicine contact a poison control center or emergency room at once. NOTE: This medicine is only for you.  Do not share this medicine with others. What if I miss a dose? This does not apply. This medicine is not for regular use. What may interact with this medication? This medicine may interact with the following medications: certain medicines for fungal infections like fluconazole, itraconazole rifampin This list may not describe all possible interactions. Give your health care provider a list of all the medicines, herbs, non-prescription drugs, or dietary supplements you use. Also tell them if you smoke, drink alcohol, or use illegal drugs. Some items may interact with your medicine. What should I watch for while using this medication? Visit your health care professional for regular checks on your progress. Tell your health care professional if your symptoms do not start to get better or if they get worse. What side effects may I notice from receiving this medication? Side effects that you should report to your doctor or health care professional as soon as possible: allergic reactions like skin rash, itching or hives; swelling of the face, lips, or tongue Side effects that usually do not require medical attention (report these to your doctor or health care professional if they continue or are bothersome): nausea This list may not describe all possible side effects. Call your doctor for medical advice about side effects. You may report side effects to FDA at 1-800-FDA-1088. Where should I keep my medication? Keep out of the reach of children and pets. Store at room temperature between 20 and 25 degrees C (68 and 77 degrees F).  Get rid of any unused medicine after the expiration date. To get rid of medicines that are no longer needed or have expired: Take the medicine to a medicine take-back program. Check with your pharmacy or law enforcement to find a location. If you cannot return the medicine, check the label or package insert to see if the medicine should be thrown out in the garbage or flushed  down the toilet. If you are not sure, ask your health care provider. If it is safe to put it in the trash, take the medicine out of the container. Mix the medicine with cat litter, dirt, coffee grounds, or other unwanted substance. Seal the mixture in a bag or container. Put it in the trash. NOTE: This sheet is a summary. It may not cover all possible information. If you have questions about this medicine, talk to your doctor, pharmacist, or health care provider.  2022 Elsevier/Gold Standard (2020-01-01 17:56:55)   Rolanda Lundborg injection What is this medication? FREMANEZUMAB (fre ma NEZ ue mab) is used to prevent migraine headaches. This medicine may be used for other purposes; ask your health care provider or pharmacist if you have questions. COMMON BRAND NAME(S): AJOVY What should I tell my care team before I take this medication? They need to know if you have any of these conditions: an unusual or allergic reaction to fremanezumab, other medicines, foods, dyes, or preservatives pregnant or trying to get pregnant breast-feeding How should I use this medication? This medicine is for injection under the skin. You will be taught how to prepare and give this medicine. Use exactly as directed. Take your medicine at regular intervals. Do not take your medicine more often than directed. It is important that you put your used needles and syringes in a special sharps container. Do not put them in a trash can. If you do not have a sharps container, call your pharmacist or healthcare provider to get one. Talk to your pediatrician regarding the use of this medicine in children. Special care may be needed. Overdosage: If you think you have taken too much of this medicine contact a poison control center or emergency room at once. NOTE: This medicine is only for you. Do not share this medicine with others. What if I miss a dose? If you miss a dose, take it as soon as you can. If it is almost time for your  next dose, take only that dose. Do not take double or extra doses. What may interact with this medication? Interactions are not expected. This list may not describe all possible interactions. Give your health care provider a list of all the medicines, herbs, non-prescription drugs, or dietary supplements you use. Also tell them if you smoke, drink alcohol, or use illegal drugs. Some items may interact with your medicine. What should I watch for while using this medication? Tell your doctor or healthcare professional if your symptoms do not start to get better or if they get worse. What side effects may I notice from receiving this medication? Side effects that you should report to your doctor or health care professional as soon as possible: allergic reactions like skin rash, itching or hives, swelling of the face, lips, or tongue Side effects that usually do not require medical attention (report these to your doctor or health care professional if they continue or are bothersome): pain, redness, or irritation at site where injected This list may not describe all possible side effects. Call your doctor for medical advice about side effects.  You may report side effects to FDA at 1-800-FDA-1088. Where should I keep my medication? Keep out of the reach of children. You will be instructed on how to store this medicine. Throw away any unused medicine after the expiration date on the label. NOTE: This sheet is a summary. It may not cover all possible information. If you have questions about this medicine, talk to your doctor, pharmacist, or health care provider.  2022 [employer]/Gold Standard (2017-04-18 17:22:56)

## 2021-06-01 NOTE — Progress Notes (Signed)
[facility]    Provider:  Dr Lucia Gaskins Requesting Provider: Dana Allan, MD Primary Care Provider:  Dana Allan, MD  CC:  migraines  06/01/2021: Failed Topiramate. Will start Ajovy. Also gave samples of nurtec.  Patient states she has been under extreme stress, she went through a whole bottle of Excedrin, topiramate did not work and she had side effects to it, she is also failed rizatriptan and Imitrex for acute management.  She reports today that she has been drinking excessively, she can drink a bottle a day of Taylor port wine, she keeps bottles under her bed, we discussed getting therapy and counseling, I also advised that she not stop abruptly as this can cause withdrawal which can lead to significant morbidity and mortality, she needs to be under the care of a physician, she spoke to her primary care about it I encouraged her to go back and speak with her primary care again.  Today I will refer her to [facility] behavioral health as well as [facility], I believe there is a substance abuse specialist at [facility] and will see where she can get an appointment soonest.  MRI brain w/wo contrast:11/15/20: IMPRESSION: This MRI of the brain with and without contrast shows the following: 1.   Brain parenchyma has normal signal before and after contrast. 2.   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 3.   No acute findings.  Normal enhancement pattern Personally reviewed images and agree.   HPI 09/2020:  Sabrina Brennan is a 30 y.o. female here as requested by Dana Allan, MD for headaches.  Past medical history migraines, asthma, constipation.  I reviewed notes from Dr. Clent Ridges: Patient's had headaches for 10 years, throbbing, 2-3 times a week, prior treatments including Excedrin, Tylenol, sumatriptan, Fioricet, no nausea or vomiting but did endorse photophobia and phonophobia, with a family history of migraines and personal stressors.  Diagnosed with migraine  without aura.  Neuro examination showed no focal deficits.  Imitrex was increased to 50 mg, they started propranolol 10 mg twice daily, neurology referral was placed.  Patient is here alone, patient has migraines, in fact had a migraine yesterday she tried to sleep it off but it still there today.  She is had them for years, after high school.  She was dropped on her head while cheerleading and she has had headaches since then.  She was given butalbital.  She gets migraines at least 4 times a week and they can last up to 2 days.  Other medications tried include sumatriptan, propranolol and Excedrin. Her migraines starts on the left side but can be either side behind the eyes, vision will get blurry, feel the pain, pounding/pulsating/throbbing, photo/phonophobia, nausea, can be moderately severe to severe, one migraine she felt left-sided numbness and weakness, she had to pull over, she couldn't see, this has happened 5x in the setting of migraines, can last 24-72 hours at least, movement makes it worse, she is having episodes of weakness on the left side. She has daily headaches. Unknown triggers. 12 migraine days in a month for years.   Reviewed notes, labs and imaging from outside physicians, which showed:  From a thorough review of records, medications tried that can be used in migraine management include: Amitriptyline, Tylenol, Excedrin Migraine, Fioricet, Celebrex, Decadron injection, ibuprofen, ketorolac injection, meclizine, Reglan tablets and injections, Zofran oral and injections, prednisone tablets, Phenergan tablets and injections, propranolol tablets, sumatriptan, imitrex, failed topiramate. Rizatriptan.    CT  head 02-05-2013: showed No acute intracranial abnormalities including mass lesion or mass effect, hydrocephalus, extra-axial fluid collection, midline shift, hemorrhage, or acute infarction, large ischemic events (personally reviewed images)   04/2020: cbc/cmp/tsh nml  Review of  Systems: Patient complains of symptoms per HPI as well as the following symptoms: stress,alcohol abuse . Pertinent negatives and positives per HPI. All others negative    Social History   Socioeconomic History   Marital status: Single    Spouse name: Not on file   Number of children: Not on file   Years of education: Not on file   Highest education level: Not on file  Occupational History   Not on file  Tobacco Use   Smoking status: Light Smoker   Smokeless tobacco: Never   Tobacco comments:    Hooka 2x monthly  Vaping Use   Vaping Use: Never used  Substance and Sexual Activity   Alcohol use: Yes    Alcohol/week: 4.0 - 5.0 standard drinks    Types: 4 - 5 Glasses of wine per week    Comment: taylor port wine 18%, can drink a whole bottle in a day, last month drank alot of liquor.   Drug use: No   Sexual activity: Yes    Birth control/protection: Injection  Other Topics Concern   Not on file  Social History Narrative   Lives with her mom   Right handed   Caffeine: 2 liter pepsi/day      Patient reports she is under family stress. Will be seeking help from counselor.    Social Determinants of Health   Financial Resource Strain: Not on file  Food Insecurity: Not on file  Transportation Needs: Not on file  Physical Activity: Not on file  Stress: Not on file  Social Connections: Not on file  Intimate Partner Violence: Not on file    Family History  Problem Relation Age of Onset   Healthy Mother    Healthy Father    Breast cancer Maternal Grandmother    Hypertension Maternal Grandmother    Pancreatic cancer Maternal Grandfather    Migraines Maternal Grandfather    Breast cancer Other        Maternal aunt    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    Hemiplegic migraine without status migrainosus, not intractable 10/29/2020   Chronic migraine without aura without status migrainosus, not intractable 10/29/2020   Chronic migraine with aura 10/29/2020   Acute viral disease 09/25/2020   History of abnormal cervical Pap smear 08/28/2020   Acute asthma exacerbation 08/03/2020   Left ear pain 07/22/2020   Migraine headache without aura 07/03/2020   Right lower quadrant abdominal pain 05/01/2020   Constipation 04/21/2020   High grade squamous intraepithelial lesion (HGSIL), grade 3 CIN, 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 30 tablet 11   Fremanezumab-vfrm (AJOVY) 225 MG/1.5ML SOAJ Inject 225 mg into the skin every 30 (thirty) days. 1.5 mL 11   medroxyPROGESTERone (DEPO-PROVERA) 150 MG/ML injection Inject 150 mg into the muscle every 3 (three) months.     Rimegepant Sulfate (NURTEC) 75 MG TBDP Take 75 mg by mouth daily as needed. For migraines. Take as close to onset of migraine as possible. One daily maximum. 10 tablet 6   SUMAtriptan (IMITREX) 100 MG tablet Take 1 tablet (100 mg total) by mouth every 2 (two) hours as needed for migraine. May repeat in 2 hours if headache persists or recurs. 30 tablet 3   Current Facility-Administered Medications  Medication Dose Route Frequency Provider Last Rate Last Admin   medroxyPROGESTERone (DEPO-PROVERA) injection 150 mg  150 mg Intramuscular Once Dana Allan, MD        Allergies as of 06/01/2021   (No Known Allergies)    Vitals: BP 120/84  (BP Location: Right Arm, Patient Position: Sitting)   Pulse 74   Ht 4\' 11"  (1.499 m)   Wt 169 lb (76.7 kg)   BMI 34.13 kg/m  Last Weight:  Wt Readings from Last 1 Encounters:  06/01/21 169 lb (76.7 kg)   Last Height:   Ht Readings from Last 1 Encounters:  06/01/21 4\' 11"  (1.499 m)   Exam: NAD, pleasant                  Speech:    Speech is normal; fluent and spontaneous with normal comprehension.  Cognition:    The patient is oriented to person, place, and time;     recent and remote memory intact;     language fluent;    Cranial Nerves:    The pupils are equal, round, and reactive to light.Trigeminal sensation is intact and the muscles of mastication are normal. The face is symmetric. The palate elevates in the midline. Hearing intact. Voice is normal. Shoulder shrug is normal. The tongue has normal motion without fasciculations.   Coordination:  No dysmetria  Motor Observation:    No asymmetry, no atrophy, and no involuntary movements noted. Tone:    Normal muscle tone.     Strength:    Strength is V/V in the upper and lower limbs.      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  - Failed Topiramate. Will start Ajovy. Also gave samples of nurtec.   -Patient states she has been under extreme stress, she went through a whole bottle of Excedrin, topiramate did not work and she had side effects to it, she has also failed rizatriptan and Imitrex for acute management.   - She reports today that she has been drinking excessively, she can drink a bottle a day of Taylor port wine, she keeps bottles under her bed, we discussed getting therapy and counseling, I also advised that she not stop abruptly as this can cause withdrawal which can lead to significant morbidity and mortality, she needs to be under the care of a physician and counselor, she spoke to her primary  care about it and I encouraged her to go back and speak with her primary care again.  Today I will refer her to psychiatry and counseling.  MRI brain w/wo contrast:11/15/20: IMPRESSION: This MRI of the brain with and without contrast shows the following: 1.   Brain parenchyma has normal signal  before and after contrast. 2.   65mm pineal cyst unchanged compared to a CT scan from 2014. 3.   No acute findings.  Normal enhancement pattern.   Discussed teratogenicity of Ajovy and Emgality, do not get pregnant on medications or for 6 months afterwards stopping.   Rizatriptan: Please take one tablet at the onset of your headache. If it does not improve the symptoms please take one additional tablet. Do not take more then 2 tablets in 24hrs. Do not take use more then 2 to 3 times in a week.  Discussed:  There is increased risk for stroke in women with migraine with aura and a contraindication for the combined contraceptive pill for use by women who have migraine with aura. The risk for women with migraine without aura is lower. However other risk factors like smoking are far more likely to increase stroke risk than migraine. There is a recommendation for no smoking and for the use of OCPs without estrogen such as progestogen only pills particularly for women with migraine with aura.Marland Kitchen People who have migraine headaches with auras may be 3 times more likely to have a stroke caused by a blood clot, compared to migraine patients who don't see auras. Women who take hormone-replacement therapy may be 30 percent more likely to suffer a clot-based stroke than women not taking medication containing estrogen. Other risk factors like smoking and high blood pressure may be  much more important.   Orders Placed This Encounter  Procedures   Ambulatory referral to Psychiatry   Ambulatory referral to Psychiatry    Meds ordered this encounter  Medications   Fremanezumab-vfrm (AJOVY) 225 MG/1.5ML SOAJ    Sig: Inject  225 mg into the skin every 30 (thirty) days.    Dispense:  1.5 mL    Refill:  11    Patient has copay card; she can have medication regardless of insurance approval or copay amount.   ketorolac (TORADOL) injection 60 mg   Rimegepant Sulfate (NURTEC) 75 MG TBDP    Sig: Take 75 mg by mouth daily as needed. For migraines. Take as close to onset of migraine as possible. One daily maximum.    Dispense:  10 tablet    Refill:  6    Discussed: To prevent or relieve headaches, try the following: Cool Compress. Lie down and place a cool compress on your head.  Avoid headache triggers. If certain foods or odors seem to have triggered your migraines in the past, avoid them. A headache diary might help you identify triggers.  Include physical activity in your daily routine. Try a daily walk or other moderate aerobic exercise.  Manage stress. Find healthy ways to cope with the stressors, such as delegating tasks on your to-do list.  Practice relaxation techniques. Try deep breathing, yoga, massage and visualization.  Eat regularly. Eating regularly scheduled meals and maintaining a healthy diet might help prevent headaches. Also, drink plenty of fluids.  Follow a regular sleep schedule. Sleep deprivation might contribute to headaches Consider biofeedback. With this mind-body technique, you learn to control certain bodily functions -- such as muscle tension, heart rate and blood pressure -- to prevent headaches or reduce headache pain.    Proceed to emergency room if you experience new or worsening symptoms or symptoms do not resolve, if you have new neurologic symptoms or if headache is severe, or for any concerning symptom.   Provided education and documentation from American headache Society toolbox including articles on: chronic migraine medication overuse  headache, chronic migraines, prevention of migraines, behavioral and other nonpharmacologic treatments for headache.  Cc: Dana Allan, MD,  Dana Allan, MD  Naomie Dean, MD  [facility] [address] [place], [place] [address]  Phone [phone] Fax [phone]  I spent over 40 minutes of face-to-face and non-face-to-face time with patient on the  1. Chronic migraine without aura without status migrainosus, not intractable   2. Alcohol dependence with unspecified alcohol-induced disorder (HCC)   3. Stress    diagnosis.  This included previsit chart review, lab review, study review, order entry, electronic health record documentation, patient education on the different diagnostic and therapeutic options, counseling and coordination of care, risks and benefits of management, compliance, or risk factor reduction

## 2021-06-01 NOTE — Telephone Encounter (Signed)
Referral sent to [facility]. Phone: [phone]. 

## 2021-06-01 NOTE — Progress Notes (Signed)
Administered Toradol 60 mg IM into RUOQ of R buttock per v.o. Dr Lucia Gaskins. Pt tolerated well. Bandaid applied, aseptic technique maintained.

## 2021-06-16 NOTE — Telephone Encounter (Signed)
Received from [facility],  PA # [id number]. Needed more information:  asking if trial or contraindication to ALL of the following preferred CGRP inhibitors aimoovig, emaglity nurtec ODT.  You had given samples of Nurtec ODT.  It also stated that AJOVY will not be used concurrently with other CGRP inhibitors (aimovig, emgality, vyepti, nurtec and qulipta). Do you want to change to other CRGP?

## 2021-06-17 ENCOUNTER — Other Ambulatory Visit: Payer: Self-pay | Admitting: Neurology

## 2021-06-22 MED ORDER — EMGALITY 120 MG/ML ~~LOC~~ SOAJ: SUBCUTANEOUS | 5 refills | Status: DC

## 2021-06-22 NOTE — Telephone Encounter (Signed)
Initiated Equities trader (Key: [id number]) Emgality 120MG /ML auto-injectors (migraine). Determination up to 5 days.

## 2021-06-24 ENCOUNTER — Ambulatory Visit: Payer: 59 | Admitting: Neurology

## 2021-06-27 ENCOUNTER — Emergency Department (HOSPITAL_COMMUNITY)
Admission: EM | Admit: 2021-06-27 | Discharge: 2021-06-27 | Disposition: A | Discharge status: Home / Self Care | Payer: BC Managed Care – PPO | Attending: Emergency Medicine | Admitting: Emergency Medicine

## 2021-06-27 ENCOUNTER — Other Ambulatory Visit: Payer: Self-pay

## 2021-06-27 DIAGNOSIS — J101 Influenza due to other identified influenza virus with other respiratory manifestations: Secondary | ICD-10-CM | POA: Diagnosis not present

## 2021-06-27 DIAGNOSIS — Z20822 Contact with and (suspected) exposure to covid-19: Secondary | ICD-10-CM | POA: Diagnosis not present

## 2021-06-27 DIAGNOSIS — F17200 Nicotine dependence, unspecified, uncomplicated: Secondary | ICD-10-CM | POA: Diagnosis not present

## 2021-06-27 DIAGNOSIS — R079 Chest pain, unspecified: Secondary | ICD-10-CM | POA: Diagnosis present

## 2021-06-27 LAB — RESP PANEL BY RT-PCR (FLU A&B, COVID) ARPGX2
Influenza A by PCR: POSITIVE — AB
Influenza B by PCR: NEGATIVE
SARS Coronavirus 2 by RT PCR: NEGATIVE

## 2021-06-27 MED ORDER — BENZONATATE 100 MG PO CAPS: 100.0000 mg | ORAL_CAPSULE | Freq: Three times a day (TID) | ORAL | 0 refills | Status: DC

## 2021-06-27 MED ORDER — LIDOCAINE VISCOUS HCL 2 % MT SOLN: 15.0000 mL | OROMUCOSAL | 0 refills | Status: DC | PRN

## 2021-06-27 MED ORDER — ACETAMINOPHEN 500 MG PO TABS
1000.0000 mg | ORAL_TABLET | Freq: Once | ORAL | Status: AC
  Administered 2021-06-27: 1000 mg via ORAL
  Filled 2021-06-27: qty 2

## 2021-06-27 NOTE — ED Provider Notes (Signed)
[facility] EMERGENCY DEPARTMENT Provider Note   CSN: [id number] Arrival date & time: 06/27/21  0217     History Chief Complaint  Patient presents with   Chest Pain   Chills    Sabrina Brennan is a 30 y.o. female.  The history is provided by the patient and medical records.  Chest Pain  29 year old female presenting to the ED with 1 week of URI symptoms.  She reports dry cough, chills, body aches, congestion, and poor appetite.  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  States she lost her voice yesterday.  She has been taking NyQuil at home without much relief.  She is not had any medication in quite a few hours.  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/29/2020   Chronic migraine without aura without status migrainosus, not intractable 10/29/2020   Chronic migraine with aura 10/29/2020   Acute viral disease 09/25/2020   History of abnormal cervical Pap smear 08/28/2020   Acute asthma exacerbation 08/03/2020   Left ear pain 07/22/2020   Migraine headache without aura 07/03/2020   Right lower quadrant abdominal pain 05/01/2020   Constipation 04/21/2020   High grade squamous intraepithelial lesion (HGSIL), grade 3 CIN, 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   Breast mass in female 08/29/2013    Past Surgical History:  Procedure Laterality Date   EXCISION MASS LOWER EXTREMETIES Left 12/11/2019   Procedure: EXCISION LEFT THIGH CYST;  Surgeon: Harriette Bouillon, MD;  Location: [facility];  Service: General;  Laterality: Left;   LIPOSUCTION MULTIPLE BODY PARTS       OB History     Gravida  1   Para  0   Term      Preterm      AB  1    Living         SAB      IAB  1   Ectopic      Multiple      Live Births           Obstetric Comments  1st Menstrual Cycle:  14         Family History  Problem Relation Age of Onset   Healthy Mother    Healthy Father    Breast cancer Maternal Grandmother    Hypertension Maternal Grandmother    Pancreatic cancer Maternal Grandfather    Migraines Maternal Grandfather    Breast cancer Other        Maternal aunt    Social History   Tobacco Use   Smoking status: Light Smoker   Smokeless tobacco: Never   Tobacco comments:    Hooka 2x monthly  Vaping Use   Vaping Use: Never used  Substance Use Topics   Alcohol use: Yes    Alcohol/week: 4.0 - 5.0 standard drinks    Types: 4 - 5 Glasses of wine per week    Comment: taylor port wine 18%, can drink a whole bottle in a day, last month drank alot of liquor.   Drug use: No    Home Medications Prior to Admission medications   Medication Sig Start Date End Date Taking? Authorizing Provider  benzonatate (TESSALON)  100 MG capsule Take 1 capsule (100 mg total) by mouth every 8 (eight) hours. 06/27/21  Yes Garlon Hatchet, PA-C  lidocaine (XYLOCAINE) 2 % solution Use as directed 15 mLs in the mouth or throat as needed for mouth pain. 06/27/21  Yes Garlon Hatchet, PA-C  cetirizine (ZYRTEC) 10 MG tablet Take 1 tablet (10 mg total) by mouth daily. 11/06/20   Dana Allan, MD  Galcanezumab-gnlm [facility]) 120 MG/ML SOAJ Initiate therapy Loading dose of 240mg  then after 30 days 120mg   every 30 days. 06/22/21   , MD  medroxyPROGESTERone (DEPO-PROVERA) 150 MG/ML injection Inject 150 mg into the muscle every 3 (three) months.    [provider]  SUMAtriptan (IMITREX) 100 MG tablet Take 1 tablet (100 mg total) by mouth every 2 (two) hours as needed for migraine. May repeat in 2 hours if headache persists or recurs. 03/09/21 06/01/21  05/09/21, MD    Allergies    Patient has no known allergies.  Review of  Systems   Review of Systems  Cardiovascular:  Positive for chest pain.  All other systems reviewed and are negative.  Physical Exam Updated Vital Signs BP (!) 131/94 (BP Location: Left Arm)   Pulse (!) 110   Temp 99.3 F (37.4 C)   Resp 20   Ht 4\' 11"  (1.499 m)   Wt 74.8 kg   SpO2 99%   BMI 33.33 kg/m   Physical Exam Vitals and nursing note reviewed.  Constitutional:      Appearance: She is well-developed.  HENT:     Head: Normocephalic and atraumatic.     Right Ear: Tympanic membrane and ear canal normal.     Left Ear: Tympanic membrane and ear canal normal.     Nose: Congestion present.     Mouth/Throat:     Lips: Pink.     Comments: Throat appears irritated without tonsillar edema or exudates, some PND noted Voice is hoarse, no stridor or drooling Eyes:     Conjunctiva/sclera: Conjunctivae normal.     Pupils: Pupils are equal, round, and reactive to light.  Cardiovascular:     Rate and Rhythm: Normal rate and regular rhythm.     Heart sounds: Normal heart sounds.  Pulmonary:     Effort: Pulmonary effort is normal.     Breath sounds: Normal breath sounds.  Abdominal:     General: Bowel sounds are normal.     Palpations: Abdomen is soft.  Musculoskeletal:        General: Normal range of motion.     Cervical back: Normal range of motion.  Skin:    General: Skin is warm and dry.  Neurological:     Mental Status: She is alert and oriented to person, place, and time.    ED Results / Procedures / Treatments   Labs (all labs ordered are listed, but only abnormal results are displayed) Labs Reviewed  RESP PANEL BY RT-PCR (FLU A&B, COVID) ARPGX2 - Abnormal; Notable for the following components:      Result Value   Influenza A by PCR POSITIVE (*)    All other components within normal limits    EKG None  Radiology No results found.  Procedures Procedures   Medications Ordered in ED Medications  acetaminophen (TYLENOL) tablet 1,000 mg (1,000 mg Oral  Given 06/27/21 0308)    ED Course  I have reviewed the triage vital signs and the nursing notes.  Pertinent labs & imaging results that were  available during my care of the patient were reviewed by me and considered in my medical decision making (see chart for details).    MDM Rules/Calculators/A&P                           30 year old female here with 1 week of URI type symptoms.  She lost her voice yesterday.  Low-grade fever here but nontoxic in appearance.  Throat does appear irritated and voice is hoarse but she has no stridor or drooling.  She is positive for influenza A.  As she is already had symptoms for 1 week she is out of the window to initiate Tamiflu.  Will discharge home with symptomatic care and close PCP follow-up.  Return here for new concerns.  Final Clinical Impression(s) / ED Diagnoses Final diagnoses:  Influenza A    Rx / DC Orders ED Discharge Orders          Ordered    lidocaine (XYLOCAINE) 2 % solution  As needed        06/27/21 0352    benzonatate (TESSALON) 100 MG capsule  Every 8 hours        06/27/21 0352             Garlon Hatchet, PA-C 06/27/21 0357    Sabas Sous, MD 06/27/21 [phone]

## 2021-06-27 NOTE — ED Triage Notes (Signed)
Pt c/o chest pain when she coughs. Pt also reports fever and chills.

## 2021-06-27 NOTE — Discharge Instructions (Signed)
Take the prescribed medication as directed.  Can continue tylenol and motrin as needed for bodyaches and/or fever. Make sure to rest and hydrate well. Follow-up with your primary care doctor. Return to the ED for new or worsening symptoms.

## 2021-06-28 ENCOUNTER — Telehealth: Payer: BC Managed Care – PPO

## 2021-06-29 ENCOUNTER — Telehealth: Payer: BC Managed Care – PPO | Admitting: Family Medicine

## 2021-06-29 DIAGNOSIS — R051 Acute cough: Secondary | ICD-10-CM | POA: Diagnosis not present

## 2021-06-29 MED ORDER — PROMETHAZINE-DM 6.25-15 MG/5ML PO SYRP: 2.5000 mL | ORAL_SOLUTION | Freq: Three times a day (TID) | ORAL | 0 refills | Status: DC | PRN

## 2021-06-29 NOTE — Progress Notes (Signed)
Virtual Visit Consent   Sabrina Brennan, you are scheduled for a virtual visit with a [facility] provider today.     Just as with appointments in the office, your consent must be obtained to participate.  Your consent will be active for this visit and any virtual visit you may have with one of our providers in the next 365 days.     If you have a MyChart account, a copy of this consent can be sent to you electronically.  All virtual visits are billed to your insurance company just like a traditional visit in the office.    As this is a virtual visit, video technology does not allow for your provider to perform a traditional examination.  This may limit your provider's ability to fully assess your condition.  If your provider identifies any concerns that need to be evaluated in person or the need to arrange testing (such as labs, EKG, etc.), we will make arrangements to do so.     Although advances in technology are sophisticated, we cannot ensure that it will always work on either your end or our end.  If the connection with a video visit is poor, the visit may have to be switched to a telephone visit.  With either a video or telephone visit, we are not always able to ensure that we have a secure connection.     I need to obtain your verbal consent now.   Are you willing to proceed with your visit today?    [name] has provided verbal consent on 06/29/2021 for a virtual visit (video or telephone).   Freddy Finner, NP   Date: 06/29/2021 9:31 AM   Virtual Visit via Video Note   I, Freddy Finner, connected with  ICIS BUDREAU  ([id number], [birth year]) on 06/29/21 at  9:30 AM EST by a video-enabled telemedicine application and verified that I am speaking with the correct person using two identifiers.  Location: Patient: Virtual Visit Location Patient: Home Provider: Virtual Visit Location Provider: Mobile   I discussed the limitations of evaluation and management by telemedicine  and the availability of in person appointments. The patient expressed understanding and agreed to proceed.    History of Present Illness: Sabrina Brennan is a 30 y.o. who identifies as a female who was assigned female at birth, and is being seen today for Flu A was seen in ED on 11/26  Had been one week into URi symptoms. Still reporting dry cough that is on going and worse symptom of all. Body aches, congestion, poor appetite, fevers and chills. Chest pain due to coughing. Denies shortness of breat. 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 Perles not helping much, has not been taking Tylenol or ibuprofen in rotation around the clock.   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   Hemiplegic migraine without status migrainosus, not intractable 10/29/2020   Chronic migraine without aura without status migrainosus, not intractable 10/29/2020   Chronic migraine with aura 10/29/2020   Acute viral disease 09/25/2020   History of abnormal cervical Pap smear 08/28/2020   Acute asthma exacerbation 08/03/2020   Left ear pain 07/22/2020   Migraine headache without aura 07/03/2020   Right lower quadrant abdominal pain 05/01/2020   Constipation 04/21/2020   High grade squamous intraepithelial lesion (HGSIL), grade 3 CIN, on  biopsy of cervix 05/28/2019   Breast mass in female 08/29/2013    Allergies: No Known Allergies Medications:  Current Outpatient Medications:    benzonatate (TESSALON) 100 MG capsule, Take 1 capsule (100 mg total) by mouth every 8 (eight) hours., Disp: 21 capsule, Rfl: 0   cetirizine (ZYRTEC) 10 MG tablet, Take 1 tablet (10 mg total) by mouth daily., Disp: 30 tablet, Rfl: 11   Galcanezumab-gnlm (EMGALITY) 120 MG/ML SOAJ, Initiate therapy Loading dose of 240mg  then after 30 days 120mg   every 30 days., Disp: 2.26 mL, Rfl: 5    lidocaine (XYLOCAINE) 2 % solution, Use as directed 15 mLs in the mouth or throat as needed for mouth pain., Disp: 150 mL, Rfl: 0   medroxyPROGESTERone (DEPO-PROVERA) 150 MG/ML injection, Inject 150 mg into the muscle every 3 (three) months., Disp: , Rfl:    SUMAtriptan (IMITREX) 100 MG tablet, Take 1 tablet (100 mg total) by mouth every 2 (two) hours as needed for migraine. May repeat in 2 hours if headache persists or recurs., Disp: 30 tablet, Rfl: 3  Current Facility-Administered Medications:    medroxyPROGESTERone (DEPO-PROVERA) injection 150 mg, 150 mg, Intramuscular, Once, , MD  Observations/Objective: Patient is well-developed, well-nourished in no acute distress.  Resting comfortably  at home.  Head is normocephalic, atraumatic.  No labored breathing.  Speech is clear and coherent with logical content.  Patient is alert and oriented at baseline.    Assessment and Plan:  1. Acute cough Perles not working  Flu A + advised on OTC pain medications to help with fevers and body aches Will try Prometh DM to help with cough.   Reviewed side effects, risks and benefits of medication.      - promethazine-dextromethorphan (PROMETHAZINE-DM) 6.25-15 MG/5ML syrup; Take 2.5 mLs by mouth 3 (three) times daily as needed for cough.  Dispense: 118 mL; Refill: 0  Patient acknowledged agreement and understanding of the plan.   I discussed the assessment and treatment plan with the patient. The patient was provided an opportunity to ask questions and all were answered. The patient agreed with the plan and demonstrated an understanding of the instructions.   The patient was advised to call back or seek an in-person evaluation if the symptoms worsen or if the condition fails to improve as anticipated.   The above assessment and management plan was discussed with the patient. The patient verbalized understanding of and has agreed to the management plan. Patient is aware to call the  clinic if symptoms persist or worsen. Patient is aware when to return to the clinic for a follow-up visit. Patient educated on when it is appropriate to go to the emergency department.   Follow Up Instructions: I discussed the assessment and treatment plan with the patient. The patient was provided an opportunity to ask questions and all were answered. The patient agreed with the plan and demonstrated an understanding of the instructions.  A copy of instructions were sent to the patient via MyChart unless otherwise noted below.     The patient was advised to call back or seek an in-person evaluation if the symptoms worsen or if the condition fails to improve as anticipated.  Time:  I spent 10 minutes with the patient via telehealth technology discussing the above problems/concerns.    Dana Allan, NP

## 2021-06-29 NOTE — Patient Instructions (Signed)
I appreciate the opportunity to provide you with care for your health and wellness.  Sorry you are feeling bad.  Take ibuprofen and Tylenol in rotation as directed on packages to help with fever and aches.  Use cough syrup when not driving or working as it will make you sleepy  I hope you feel better soon, get lots of rest and fluids!  Happy Holidays!  Please continue to practice social distancing to keep you, your family, and our community safe.  If you must go out, please wear a mask and practice good handwashing.  It was a pleasure to see you and I look forward to continuing to work together on your health and well-being. Please do not hesitate to call the office if you need care or have questions about your care.  Have a wonderful day. With Gratitude, Tereasa Coop, DNP, AGNP-BC

## 2021-06-30 ENCOUNTER — Encounter (HOSPITAL_COMMUNITY): Payer: Self-pay

## 2021-06-30 ENCOUNTER — Ambulatory Visit (HOSPITAL_COMMUNITY)
Admission: EM | Admit: 2021-06-30 | Discharge: 2021-06-30 | Disposition: A | Discharge status: Home / Self Care | Payer: BC Managed Care – PPO

## 2021-06-30 ENCOUNTER — Other Ambulatory Visit: Payer: Self-pay

## 2021-06-30 DIAGNOSIS — J09X2 Influenza due to identified novel influenza A virus with other respiratory manifestations: Secondary | ICD-10-CM | POA: Diagnosis not present

## 2021-06-30 NOTE — Discharge Instructions (Signed)
Continue to use over the counter medicines to help deal with your symptoms until your body recovers from this illness. Make sure to drink plenty of liquids to stay hydrated.

## 2021-06-30 NOTE — ED Provider Notes (Signed)
[facility]    CSN: [id number] Arrival date & time: 06/30/21  0803      History   Chief Complaint Chief Complaint  Patient presents with   Cough   Fever    HPI Sabrina Brennan is a 30 y.o. female.  Patient reports testing positive for influenza on 06/27/2021 in the emergency room.  Was given a work note to return tomorrow however she still feels ill and is not ready to go back to work.  Reports typical flu symptoms of fever, body aches, sore throat, nasal congestion, cough.  Reports she does feel short of breath at times.   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/29/2020   Chronic migraine without aura without status migrainosus, not intractable 10/29/2020   Chronic migraine with aura 10/29/2020   Acute viral disease 09/25/2020   History of abnormal cervical Pap smear 08/28/2020   Acute asthma exacerbation 08/03/2020   Left ear pain 07/22/2020   Migraine headache without aura 07/03/2020   Right lower quadrant abdominal pain 05/01/2020   Constipation 04/21/2020   High grade squamous intraepithelial lesion (HGSIL), grade 3 CIN, 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   Breast mass in female 08/29/2013    Past Surgical History:  Procedure Laterality Date   EXCISION MASS LOWER EXTREMETIES Left 12/11/2019   Procedure: EXCISION LEFT THIGH CYST;  Surgeon: Erroll Luna, MD;  Location: [facility];  Service: General;  Laterality: Left;    LIPOSUCTION MULTIPLE BODY PARTS      OB History     Gravida  1   Para  0   Term      Preterm      AB  1   Living         SAB      IAB  1   Ectopic      Multiple      Live Births           Obstetric Comments  1st Menstrual Cycle:  14          Home Medications    Prior to Admission medications   Medication Sig Start Date End Date Taking? Authorizing Provider  benzonatate (TESSALON) 100 MG capsule Take 1 capsule (100 mg total) by mouth every 8 (eight) hours. 06/27/21   Larene Pickett, PA-C  cetirizine (ZYRTEC) 10 MG tablet Take 1 tablet (10 mg total) by mouth daily. 11/06/20   Carollee Leitz, MD  Galcanezumab-gnlm [facility]) 120 MG/ML SOAJ Initiate therapy Loading dose of 240mg  then after 30 days 120mg   every 30 days. 06/22/21   Melvenia Beam, MD  lidocaine (XYLOCAINE) 2 % solution Use as directed 15 mLs in the mouth or throat as needed for mouth pain. 06/27/21   Larene Pickett, PA-C  medroxyPROGESTERone (DEPO-PROVERA) 150 MG/ML injection Inject 150 mg into the muscle every 3 (three) months.    [provider]  promethazine-dextromethorphan (PROMETHAZINE-DM) 6.25-15 MG/5ML syrup Take 2.5 mLs by mouth 3 (three) times daily  as needed for cough. 06/29/21   Perlie Mayo, NP  SUMAtriptan (IMITREX) 100 MG tablet Take 1 tablet (100 mg total) by mouth every 2 (two) hours as needed for migraine. May repeat in 2 hours if headache persists or recurs. 03/09/21 06/01/21  Lattie Haw, MD    Family History Family History  Problem Relation Age of Onset   Healthy Mother    Healthy Father    Breast cancer Maternal Grandmother    Hypertension Maternal Grandmother    Pancreatic cancer Maternal Grandfather    Migraines Maternal Grandfather    Breast cancer Other        Maternal aunt    Social History Social History   Tobacco Use   Smoking status: Light Smoker   Smokeless tobacco: Never   Tobacco comments:    Hooka 2x monthly  Vaping Use   Vaping Use:  Never used  Substance Use Topics   Alcohol use: Yes    Alcohol/week: 4.0 - 5.0 standard drinks    Types: 4 - 5 Glasses of wine per week    Comment: taylor port wine 18%, can drink a whole bottle in a day, last month drank alot of liquor.   Drug use: No     Allergies   Patient has no allergy information on record.   Review of Systems Review of Systems  Constitutional:  Positive for appetite change, chills, fatigue and fever.  HENT:  Positive for congestion, postnasal drip, rhinorrhea and sore throat. Negative for ear pain.   Respiratory:  Positive for cough.   Musculoskeletal:  Positive for myalgias.    Physical Exam Triage Vital Signs ED Triage Vitals  Enc Vitals Group     BP 06/30/21 0827 (!) 129/96     Pulse Rate 06/30/21 0827 79     Resp 06/30/21 0827 19     Temp 06/30/21 0827 98.3 F (36.8 C)     Temp Source 06/30/21 0827 Oral     SpO2 06/30/21 0827 98 %     Weight --      Height --      Head Circumference --      Peak Flow --      Pain Score 06/30/21 0825 4     Pain Loc --      Pain Edu? --      Excl. in [place]? --    No data found.  Updated Vital Signs BP (!) 129/96 (BP Location: Right Arm)   Pulse 79   Temp 98.3 F (36.8 C) (Oral)   Resp 19   LMP  (LMP Unknown)   SpO2 98%   Visual Acuity Right Eye Distance:   Left Eye Distance:   Bilateral Distance:    Right Eye Near:   Left Eye Near:    Bilateral Near:     Physical Exam Constitutional:      Appearance: She is ill-appearing.  HENT:     Head: Normocephalic and atraumatic.     Nose: Congestion present.  Cardiovascular:     Rate and Rhythm: Normal rate and regular rhythm.  Pulmonary:     Effort: Pulmonary effort is normal.     Breath sounds: Normal breath sounds.  Neurological:     Mental Status: She is alert.     UC Treatments / Results  Labs (all labs ordered are listed, but only abnormal results are displayed) Labs Reviewed - No data to display  EKG   Radiology No results  found.  Procedures Procedures (  including critical care time)  Medications Ordered in UC Medications - No data to display  Initial Impression / Assessment and Plan / UC Course  I have reviewed the triage vital signs and the nursing notes.  Pertinent labs & imaging results that were available during my care of the patient were reviewed by me and considered in my medical decision making (see chart for details).    Lungs are clear and O2 sats are normal on room air.  Reassured patient this is the normal course of influenza illness and she will get better.  Reviewed supportive care measures.  Given a new work note.  Final Clinical Impressions(s) / UC Diagnoses   Final diagnoses:  Influenza due to identified novel influenza A virus with other respiratory manifestations     Discharge Instructions      Continue to use over the counter medicines to help deal with your symptoms until your body recovers from this illness. Make sure to drink plenty of liquids to stay hydrated.    ED Prescriptions   None    PDMP not reviewed this encounter.   Cathlyn Parsons, NP 06/30/21 [phone]

## 2021-06-30 NOTE — ED Triage Notes (Signed)
Pt presents with a cough and fever. States she was at the ED and was tested for FLU. States she has been hot and cold. States she has been having trouble breathing, c/o congestion.

## 2021-07-06 ENCOUNTER — Telehealth: Payer: BC Managed Care – PPO | Admitting: Emergency Medicine

## 2021-07-06 DIAGNOSIS — B9689 Other specified bacterial agents as the cause of diseases classified elsewhere: Secondary | ICD-10-CM | POA: Diagnosis not present

## 2021-07-06 DIAGNOSIS — J019 Acute sinusitis, unspecified: Secondary | ICD-10-CM | POA: Diagnosis not present

## 2021-07-06 MED ORDER — SPACER/AERO-HOLDING CHAMBERS DEVI: 1.0000 | 0 refills | Status: DC | PRN

## 2021-07-06 MED ORDER — AMOXICILLIN-POT CLAVULANATE 875-125 MG PO TABS: 1.0000 | ORAL_TABLET | Freq: Two times a day (BID) | ORAL | 0 refills | Status: DC

## 2021-07-06 MED ORDER — ALBUTEROL SULFATE HFA 108 (90 BASE) MCG/ACT IN AERS: 2.0000 | INHALATION_SPRAY | Freq: Four times a day (QID) | RESPIRATORY_TRACT | 0 refills | Status: DC | PRN

## 2021-07-06 NOTE — Progress Notes (Signed)
Virtual Visit Consent   Sabrina Brennan, you are scheduled for a virtual visit with a [facility] provider today.     Just as with appointments in the office, your consent must be obtained to participate.  Your consent will be active for this visit and any virtual visit you may have with one of our providers in the next 365 days.     If you have a MyChart account, a copy of this consent can be sent to you electronically.  All virtual visits are billed to your insurance company just like a traditional visit in the office.    As this is a virtual visit, video technology does not allow for your provider to perform a traditional examination.  This may limit your provider's ability to fully assess your condition.  If your provider identifies any concerns that need to be evaluated in person or the need to arrange testing (such as labs, EKG, etc.), we will make arrangements to do so.     Although advances in technology are sophisticated, we cannot ensure that it will always work on either your end or our end.  If the connection with a video visit is poor, the visit may have to be switched to a telephone visit.  With either a video or telephone visit, we are not always able to ensure that we have a secure connection.     I need to obtain your verbal consent now.   Are you willing to proceed with your visit today?    ARALI SOMERA has provided verbal consent on 07/06/2021 for a virtual visit (video or telephone).   Cathlyn Parsons, NP   Date: 07/06/2021 3:55 PM   Virtual Visit via Video Note   I, Cathlyn Parsons, [name]  ([id number], May 06, 1991) on 07/06/21 at  3:45 PM EST by a video-enabled telemedicine application and verified that I am speaking with the correct person using two identifiers.  Location: Patient: Virtual Visit Location Patient: Other: Her car, parked in [place] Provider: Virtual Visit Location Provider: Home Office   I discussed the limitations of evaluation and  management by telemedicine and the availability of in person appointments. The patient expressed understanding and agreed to proceed.    History of Present Illness: Sabrina Brennan is a 30 y.o. who identifies as a female who was assigned female at birth, and is being seen today for hoarse voice and purulent nasal discharge.  Patient was diagnosed with influenza A on 06/27/2021.  She no longer has a fever and thought she was getting better however in the last couple of days she feels her symptoms have worsened again and now she has green nasal drainage, frequent coughing, significant postnasal drainage and she is lost her voice/her voice is hoarse.  Denies shortness of breath or wheezing.  Is also coughing up green sputum and feels she is having trouble coughing up sputum.  Is using Flonase nasal spray and Phenergan DM cough syrup with minimal relief.  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/29/2020   Chronic migraine without aura without status migrainosus, not intractable 10/29/2020   Chronic migraine with aura 10/29/2020   Acute viral disease 09/25/2020   History of abnormal cervical Pap smear 08/28/2020   Acute asthma exacerbation 08/03/2020  Left ear pain 07/22/2020   Migraine headache without aura 07/03/2020   Right lower quadrant abdominal pain 05/01/2020   Constipation 04/21/2020   High grade squamous intraepithelial lesion (HGSIL), grade 3 CIN, 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   Breast mass in female 08/29/2013    Allergies: No Known Allergies Medications:  Current Outpatient Medications:    albuterol (VENTOLIN HFA) 108 (90 Base) MCG/ACT inhaler, Inhale 2 puffs into the lungs every 6 (six) hours as needed for wheezing or shortness of breath., Disp: 8 g, Rfl: 0    amoxicillin-clavulanate (AUGMENTIN) 875-125 MG tablet, Take 1 tablet by mouth 2 (two) times daily., Disp: 14 tablet, Rfl: 0   Spacer/Aero-Holding Chambers DEVI, 1 each by Does not apply Brennan as needed., Disp: 1 each, Rfl: 0   benzonatate (TESSALON) 100 MG capsule, Take 1 capsule (100 mg total) by mouth every 8 (eight) hours., Disp: 21 capsule, Rfl: 0   cetirizine (ZYRTEC) 10 MG tablet, Take 1 tablet (10 mg total) by mouth daily., Disp: 30 tablet, Rfl: 11   Galcanezumab-gnlm (EMGALITY) 120 MG/ML SOAJ, Initiate therapy Loading dose of 240mg  then after 30 days 120mg   every 30 days., Disp: 2.26 mL, Rfl: 5   lidocaine (XYLOCAINE) 2 % solution, Use as directed 15 mLs in the mouth or throat as needed for mouth pain., Disp: 150 mL, Rfl: 0   medroxyPROGESTERone (DEPO-PROVERA) 150 MG/ML injection, Inject 150 mg into the muscle every 3 (three) months., Disp: , Rfl:    promethazine-dextromethorphan (PROMETHAZINE-DM) 6.25-15 MG/5ML syrup, Take 2.5 mLs by mouth 3 (three) times daily as needed for cough., Disp: 118 mL, Rfl: 0   SUMAtriptan (IMITREX) 100 MG tablet, Take 1 tablet (100 mg total) by mouth every 2 (two) hours as needed for migraine. May repeat in 2 hours if headache persists or recurs., Disp: 30 tablet, Rfl: 3  Current Facility-Administered Medications:    medroxyPROGESTERone (DEPO-PROVERA) injection 150 mg, 150 mg, Intramuscular, Once, , MD  Observations/Objective: Patient is well-developed, well-nourished in no acute distress.  Resting comfortably in her car.  Head is normocephalic, atraumatic.  No labored breathing.  Speech is clear and coherent with logical content.  Patient is alert and oriented at baseline.  Congested sounding with hoarse voice  Assessment and Plan: 1. Acute bacterial sinusitis  Patient likely has a secondary bacterial sinus infection after her 10 days with influenza.  Given she also describes having trouble coughing up secretions, I will also prescribe  albuterol with a spacer.  Discussed that if this care plan does not help resolve her symptoms, she will need to be seen in person again.   Follow Up Instructions: I discussed the assessment and treatment plan with the patient. The patient was provided an opportunity to ask questions and all were answered. The patient agreed with the plan and demonstrated an understanding of the instructions.  A copy of instructions were sent to the patient via MyChart unless otherwise noted below.   The patient was advised to call back or seek an in-person evaluation if the symptoms worsen or if the condition fails to improve as anticipated.  Time:  I spent 10 minutes with the patient via telehealth technology discussing the above problems/concerns.    10-04-1990, NP

## 2021-07-06 NOTE — Patient Instructions (Signed)
Sabrina Brennan, thank you for joining Sabrina Parsons, NP for today's virtual visit.  While this provider is not your primary care provider (PCP), if your PCP is located in our provider database this encounter information will be shared with them immediately following your visit.  Consent: (Patient) Sabrina Brennan provided verbal consent for this virtual visit at the beginning of the encounter.  Current Medications:  Current Outpatient Medications:    albuterol (VENTOLIN HFA) 108 (90 Base) MCG/ACT inhaler, Inhale 2 puffs into the lungs every 6 (six) hours as needed for wheezing or shortness of breath., Disp: 8 g, Rfl: 0   amoxicillin-clavulanate (AUGMENTIN) 875-125 MG tablet, Take 1 tablet by mouth 2 (two) times daily., Disp: 14 tablet, Rfl: 0   Spacer/Aero-Holding Chambers DEVI, 1 each by Does not apply Brennan as needed., Disp: 1 each, Rfl: 0   benzonatate (TESSALON) 100 MG capsule, Take 1 capsule (100 mg total) by mouth every 8 (eight) hours., Disp: 21 capsule, Rfl: 0   cetirizine (ZYRTEC) 10 MG tablet, Take 1 tablet (10 mg total) by mouth daily., Disp: 30 tablet, Rfl: 11   Galcanezumab-gnlm (EMGALITY) 120 MG/ML SOAJ, Initiate therapy Loading dose of 240mg  then after 30 days 120mg   every 30 days., Disp: 2.26 mL, Rfl: 5   lidocaine (XYLOCAINE) 2 % solution, Use as directed 15 mLs in the mouth or throat as needed for mouth pain., Disp: 150 mL, Rfl: 0   medroxyPROGESTERone (DEPO-PROVERA) 150 MG/ML injection, Inject 150 mg into the muscle every 3 (three) months., Disp: , Rfl:    promethazine-dextromethorphan (PROMETHAZINE-DM) 6.25-15 MG/5ML syrup, Take 2.5 mLs by mouth 3 (three) times daily as needed for cough., Disp: 118 mL, Rfl: 0   SUMAtriptan (IMITREX) 100 MG tablet, Take 1 tablet (100 mg total) by mouth every 2 (two) hours as needed for migraine. May repeat in 2 hours if headache persists or recurs., Disp: 30 tablet, Rfl: 3  Current Facility-Administered Medications:    medroxyPROGESTERone  (DEPO-PROVERA) injection 150 mg, 150 mg, Intramuscular, Once, , MD   Medications ordered in this encounter:  Meds ordered this encounter  Medications   albuterol (VENTOLIN HFA) 108 (90 Base) MCG/ACT inhaler    Sig: Inhale 2 puffs into the lungs every 6 (six) hours as needed for wheezing or shortness of breath.    Dispense:  8 g    Refill:  0   Spacer/Aero-Holding Chambers DEVI    Sig: 1 each by Does not apply Brennan as needed.    Dispense:  1 each    Refill:  0   amoxicillin-clavulanate (AUGMENTIN) 875-125 MG tablet    Sig: Take 1 tablet by mouth 2 (two) times daily.    Dispense:  14 tablet    Refill:  0     *If you need refills on other medications prior to your next appointment, please contact your pharmacy*  Follow-Up: Call back or seek an in-person evaluation if the symptoms worsen or if the condition fails to improve as anticipated.  Other Instructions Use the albuterol inhaler 2 puffs every 4-6 hours as needed for chest tightness or shortness of breath or wheezing. Use saline nasal spray several times a day to try to help your congestion run.  Other things that can be helpful are taking steamy showers and using things like Vicks vapor rub. If you are not getting better you will need to be seen in person such as at an urgent care.   If you have been instructed to have  an in-person evaluation today at a local Urgent Care facility, please use the link below. It will take you to a list of all of our available [facility], including address, phone number and hours of operation. Please do not delay care.  [facility]  If you or a family member do not have a primary care provider, use the link below to schedule a visit and establish care. When you choose a [facility] primary care physician or advanced practice provider, you gain a long-term partner in health. Find a Primary Care Provider  Learn more about [facility] in-office and virtual  care options: [facility] Get Care Now

## 2021-07-09 ENCOUNTER — Ambulatory Visit: Payer: BC Managed Care – PPO

## 2021-07-23 ENCOUNTER — Other Ambulatory Visit: Payer: Self-pay

## 2021-07-23 ENCOUNTER — Ambulatory Visit (INDEPENDENT_AMBULATORY_CARE_PROVIDER_SITE_OTHER): Payer: BC Managed Care – PPO

## 2021-07-23 DIAGNOSIS — Z3042 Encounter for surveillance of injectable contraceptive: Secondary | ICD-10-CM | POA: Diagnosis not present

## 2021-07-23 MED ORDER — MEDROXYPROGESTERONE ACETATE 150 MG/ML IM SUSP
150.0000 mg | Freq: Once | INTRAMUSCULAR | Status: AC
  Administered 2021-07-23: 15:00:00 150 mg via INTRAMUSCULAR

## 2021-07-23 NOTE — Progress Notes (Signed)
Patient here today for Depo Provera injection and is within her dates.    Last contraceptive appt was 08/2020. Advised patient to schedule next depo injection with provider for yearly contraceptive management appointment.   Depo given in RUOQ today.  Site unremarkable & patient tolerated injection.    Next injection due 3/9-3/23.  Reminder card given.    Veronda Prude, RN

## 2021-08-30 ENCOUNTER — Emergency Department (HOSPITAL_COMMUNITY)
Admission: EM | Admit: 2021-08-30 | Discharge: 2021-08-30 | Disposition: A | Discharge status: Home / Self Care | Payer: BC Managed Care – PPO | Attending: Emergency Medicine | Admitting: Emergency Medicine

## 2021-08-30 ENCOUNTER — Encounter (HOSPITAL_COMMUNITY): Payer: Self-pay

## 2021-08-30 ENCOUNTER — Other Ambulatory Visit: Payer: Self-pay

## 2021-08-30 DIAGNOSIS — Z20822 Contact with and (suspected) exposure to covid-19: Secondary | ICD-10-CM | POA: Diagnosis not present

## 2021-08-30 DIAGNOSIS — J019 Acute sinusitis, unspecified: Secondary | ICD-10-CM | POA: Diagnosis not present

## 2021-08-30 DIAGNOSIS — B349 Viral infection, unspecified: Secondary | ICD-10-CM | POA: Insufficient documentation

## 2021-08-30 DIAGNOSIS — J029 Acute pharyngitis, unspecified: Secondary | ICD-10-CM | POA: Diagnosis present

## 2021-08-30 LAB — RESP PANEL BY RT-PCR (FLU A&B, COVID) ARPGX2
Influenza A by PCR: NEGATIVE
Influenza B by PCR: NEGATIVE
SARS Coronavirus 2 by RT PCR: NEGATIVE

## 2021-08-30 MED ORDER — MONTELUKAST SODIUM 10 MG PO TABS: 10.0000 mg | ORAL_TABLET | Freq: Every day | ORAL | 0 refills | Status: DC

## 2021-08-30 MED ORDER — PHENOL 1.4 % MT LIQD: 1.0000 | OROMUCOSAL | 0 refills | Status: DC | PRN

## 2021-08-30 MED ORDER — KETOROLAC TROMETHAMINE 15 MG/ML IJ SOLN
15.0000 mg | Freq: Once | INTRAMUSCULAR | Status: AC
  Administered 2021-08-30: 15 mg via INTRAMUSCULAR
  Filled 2021-08-30: qty 1

## 2021-08-30 MED ORDER — LIDOCAINE VISCOUS HCL 2 % MT SOLN
15.0000 mL | Freq: Once | OROMUCOSAL | Status: AC
Start: 2021-08-30 — End: 2021-08-30
  Administered 2021-08-30: 15 mL via OROMUCOSAL
  Filled 2021-08-30: qty 15

## 2021-08-30 NOTE — Discharge Instructions (Signed)
You likely have a viral illness.  This should be treated symptomatically. Use Tylenol or ibuprofen as needed for fevers, headaches, or body aches. Continue using Flonase daily for nasal congestion and cough. Use Singulair to decrease congestion. Use chloraseptic spray as needed for throat pain. I recommend humidified air to help with Mucus.  You may also look into saline rinses or [place] which you can buy over-the-counter to help with nasal congestion, headache, and pressure Make sure you stay well-hydrated with water. Wash your hands frequently to prevent spread of infection. Follow-up with your primary care doctor in 1 week if your symptoms are not improving. Return to the emergency room if you develop chest pain, difficulty breathing, or any new or worsening symptoms.

## 2021-08-30 NOTE — ED Provider Notes (Signed)
[facility]   CSN: [id number] Arrival date & time: 08/30/21  0759     History  Chief Complaint  Patient presents with   Sore Throat    Sabrina Brennan is a 31 y.o. female presenting for evalaution of st, nasal congestion, bocy aches.   Patient states she has been feeling poorly for the past 4 days.  She reports nasal congestion resulting in a headache, sore throat, now body aches.  She does have a mild cough which is been persistent since her flu diagnosis several months ago.  No change in this.  Is nonproductive.  No shortness of breath, chest pain, nausea, vomiting, abdominal pain.  She has been taking Excedrin Migraine and sinus medication without improvement of symptoms.  She did recently get back from a trip to [place].  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   HPI     Home Medications Prior to Admission medications   Medication Sig Start Date End Date Taking? Authorizing Provider  montelukast (SINGULAIR) 10 MG tablet Take 1 tablet (10 mg total) by mouth at bedtime. 08/30/21  Yes Tarez Bowns, PA-C  phenol (CHLORASEPTIC) 1.4 % LIQD Use as directed 1 spray in the mouth or throat as needed for throat irritation / pain. 08/30/21  Yes Joshlyn Beadle, PA-C  albuterol (VENTOLIN HFA) 108 (90 Base) MCG/ACT inhaler Inhale 2 puffs into the lungs every 6 (six) hours as needed for wheezing or shortness of breath. 07/06/21   Cathlyn ParsonsKabbe, Angela M, NP  amoxicillin-clavulanate (AUGMENTIN) 875-125 MG tablet Take 1 tablet by mouth 2 (two) times daily. 07/06/21   Cathlyn ParsonsKabbe, Angela M, NP  benzonatate (TESSALON) 100 MG capsule Take 1 capsule (100 mg total) by mouth every 8 (eight) hours. 06/27/21   Garlon HatchetSanders, Lisa M, PA-C  cetirizine (ZYRTEC) 10 MG tablet Take 1 tablet (10 mg total) by mouth daily. 11/06/20   Dana AllanWalsh, Tanya, MD  Galcanezumab-gnlm [facility](EMGALITY) 120 MG/ML SOAJ Initiate therapy Loading dose of 240mg  then  after 30 days 120mg   every 30 days. 06/22/21   Anson FretAhern, Antonia B, MD  lidocaine (XYLOCAINE) 2 % solution Use as directed 15 mLs in the mouth or throat as needed for mouth pain. 06/27/21   Garlon HatchetSanders, Lisa M, PA-C  medroxyPROGESTERone (DEPO-PROVERA) 150 MG/ML injection Inject 150 mg into the muscle every 3 (three) months.    [provider]  promethazine-dextromethorphan (PROMETHAZINE-DM) 6.25-15 MG/5ML syrup Take 2.5 mLs by mouth 3 (three) times daily as needed for cough. 06/29/21   Freddy FinnerMills, Hannah M, NP  Spacer/Aero-Holding Deretha Emoryhambers DEVI 1 each by Does not apply route as needed. 07/06/21   Cathlyn ParsonsKabbe, Angela M, NP  SUMAtriptan (IMITREX) 100 MG tablet Take 1 tablet (100 mg total) by mouth every 2 (two) hours as needed for migraine. May repeat in 2 hours if headache persists or recurs. 03/09/21 06/01/21  Towanda OctavePatel, Poonam, MD      Allergies    Patient has no known allergies.    Review of Systems   Review of Systems  HENT:  Positive for congestion, sinus pressure, sinus pain and sore throat.   Respiratory:  Positive for cough.   Musculoskeletal:  Positive for myalgias.   Physical Exam Updated Vital Signs BP (!) 114/94 (BP Location: Left Arm)    Pulse 74    Temp 98.1 F (36.7 C) (Oral)    Resp 14    SpO2 99%  Physical Exam Vitals and nursing note reviewed.  Constitutional:      General: She is not in acute distress.    Appearance: Normal appearance.     Comments: Resting in the bed in NAD  HENT:     Head: Normocephalic and atraumatic.     Right Ear: Tympanic membrane, ear canal and external ear normal.     Left Ear: Tympanic membrane, ear canal and external ear normal.     Nose: Mucosal edema and congestion present.     Mouth/Throat:     Pharynx: Uvula midline.     Comments: OP clear without tonsillar swelling or exudates.  Uvula midline.  No tonsillar swelling Eyes:     Extraocular Movements: Extraocular movements intact.     Conjunctiva/sclera: Conjunctivae normal.     Pupils: Pupils are  equal, round, and reactive to light.  Cardiovascular:     Rate and Rhythm: Normal rate and regular rhythm.     Pulses: Normal pulses.  Pulmonary:     Effort: Pulmonary effort is normal.     Breath sounds: Normal breath sounds. No decreased breath sounds, wheezing, rhonchi or rales.     Comments: Speaking in full sentences.  Clear lung sounds in all fields.  Sats stable on room air. Abdominal:     General: There is no distension.     Palpations: Abdomen is soft. There is no mass.     Tenderness: There is no abdominal tenderness. There is no guarding or rebound.  Musculoskeletal:        General: Normal range of motion.     Cervical back: Normal range of motion.  Lymphadenopathy:     Cervical: No cervical adenopathy.  Skin:    General: Skin is warm.     Capillary Refill: Capillary refill takes less than 2 seconds.  Neurological:     Mental Status: She is alert and oriented to person, place, and time.    ED Results / Procedures / Treatments   Labs (all labs ordered are listed, but only abnormal results are displayed) Labs Reviewed  RESP PANEL BY RT-PCR (FLU A&B, COVID) [id number]    EKG None  Radiology No results found.  Procedures Procedures    Medications Ordered in ED Medications  ketorolac (TORADOL) 15 MG/ML injection 15 mg (15 mg Intramuscular Given 08/30/21 0819)  lidocaine (XYLOCAINE) 2 % viscous mouth solution 15 mL (15 mLs Mouth/Throat Given 08/30/21 0932)    ED Course/ Medical Decision Making/ A&P                           Medical Decision Making Risk OTC drugs. Prescription drug management.    This patient presents to the ED for concern of nasal congestion, ST, and body aches. This involves a number of treatment options, and is a complaint that carries with it a moderate risk of complications and morbidity.  The differential diagnosis includes strep, viral illness, dental problem, pna   Lab Tests:  I ordered, and personally interpreted labs.  The  pertinent results include: COVID and flu negative   Medicines ordered:  I ordered medication including toradol and viscous lidocaine  for  sx control Reevaluation of the patient after these medicines showed that the patient improved   Test Considered:  as pt's cough is nonproductive and lung exam is reassuring, doubt pna and do not feel pt needs emergency cxr.    Disposition:  After consideration of the diagnostic results and the patients response to treatment, I feel that the  patent would benefit from symptomatic management outpatient.  Discussed with patient that COVID and flu are negative.  Discussed that at this time, there is not appear to be an acute bacterial infection requiring antibiotics.  Discussed likely viral illness and continued symptomatic management.  Work note provided.  Follow-up with PCP as needed.  At this time, patient appears safe for discharge.  Return precautions given.  Patient states she understands and agrees to plan.   Final Clinical Impression(s) / ED Diagnoses Final diagnoses:  Viral illness  Acute sinusitis, recurrence not specified, unspecified location    Rx / DC Orders ED Discharge Orders          Ordered    montelukast (SINGULAIR) 10 MG tablet  Daily at bedtime        08/30/21 0956    phenol (CHLORASEPTIC) 1.4 % LIQD  As needed        08/30/21 0956              Alveria Apley, PA-C 08/30/21 1002    Melene Plan, DO 08/30/21 1004

## 2021-09-06 ENCOUNTER — Emergency Department (HOSPITAL_COMMUNITY): Payer: BC Managed Care – PPO

## 2021-09-06 ENCOUNTER — Emergency Department (HOSPITAL_COMMUNITY)
Admission: EM | Admit: 2021-09-06 | Discharge: 2021-09-06 | Disposition: A | Discharge status: Home / Self Care | Payer: BC Managed Care – PPO | Attending: Emergency Medicine | Admitting: Emergency Medicine

## 2021-09-06 ENCOUNTER — Encounter (HOSPITAL_COMMUNITY): Payer: Self-pay | Admitting: Emergency Medicine

## 2021-09-06 DIAGNOSIS — M25562 Pain in left knee: Secondary | ICD-10-CM | POA: Insufficient documentation

## 2021-09-06 DIAGNOSIS — G8929 Other chronic pain: Secondary | ICD-10-CM | POA: Insufficient documentation

## 2021-09-06 MED ORDER — KETOROLAC TROMETHAMINE 30 MG/ML IJ SOLN
30.0000 mg | Freq: Once | INTRAMUSCULAR | Status: AC
Start: 2021-09-06 — End: 2021-09-06
  Administered 2021-09-06: 30 mg via INTRAMUSCULAR
  Filled 2021-09-06: qty 1

## 2021-09-06 MED ORDER — LIDOCAINE 5 % EX PTCH: 1.0000 | MEDICATED_PATCH | CUTANEOUS | 0 refills | Status: DC

## 2021-09-06 MED ORDER — LIDOCAINE 5 % EX PTCH
1.0000 | MEDICATED_PATCH | CUTANEOUS | Status: DC
  Administered 2021-09-06: 1 via TRANSDERMAL
  Filled 2021-09-06: qty 1

## 2021-09-06 NOTE — Progress Notes (Signed)
Orthopedic Tech Progress Note Patient Details:  Sabrina Brennan [birth year] [id number]   Ortho Devices Type of Ortho Device: Knee Immobilizer, Crutches Ortho Device/Splint Location: LLE Ortho Device/Splint Interventions: Ordered, Application, Adjustment   Post Interventions Patient Tolerated: Well, Ambulated well Instructions Provided: Care of device, Poper ambulation with device, Adjustment of device  [name] Carmine Savoy 09/06/2021, 7:00 PM

## 2021-09-06 NOTE — ED Provider Notes (Signed)
[facility] EMERGENCY DEPARTMENT Provider Note   CSN: [id number] Arrival date & time: 09/06/21  1615     History  Chief Complaint  Patient presents with   Knee Pain    Sabrina Brennan is a 31 y.o. female with history of migraines.  Patient presents to the ED due to left knee pain since last week.  Patient has history of chronic left knee pain, has been seen by orthopedics as well as physical therapy.  Patient reports that she has not returned back to her orthopedist or physical therapist in "quite some time".  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  Patient states the pain is constant, states that cold weather aggravates her knee.  Patient has been attempting to control symptoms utilizing Tylenol arthritis with moderate relief of symptoms however symptoms always return.  Patient endorses left knee pain.  Patient denies any numbness, tingling, fevers, chest pain, shortness of breath, changes to skin.   Knee Pain Associated symptoms: no fever       Home Medications Prior to Admission medications   Medication Sig Start Date End Date Taking? Authorizing Provider  lidocaine (LIDODERM) 5 % Place 1 patch onto the skin daily. Remove & Discard patch within 12 hours or as directed by MD 09/06/21  Yes Al Decant, PA-C  albuterol (VENTOLIN HFA) 108 (90 Base) MCG/ACT inhaler Inhale 2 puffs into the lungs every 6 (six) hours as needed for wheezing or shortness of breath. 07/06/21   Cathlyn Parsons, NP  amoxicillin-clavulanate (AUGMENTIN) 875-125 MG tablet Take 1 tablet by mouth 2 (two) times daily. 07/06/21   Cathlyn Parsons, NP  benzonatate (TESSALON) 100 MG capsule Take 1 capsule (100 mg total) by mouth every 8 (eight) hours. 06/27/21   Garlon Hatchet, PA-C  cetirizine (ZYRTEC) 10 MG tablet Take 1 tablet (10 mg total) by mouth daily. 11/06/20    Dana Allan, MD  Galcanezumab-gnlm [facility]) 120 MG/ML SOAJ Initiate therapy Loading dose of 240mg  then after 30 days 120mg   every 30 days. 06/22/21   Anson Fret, MD  lidocaine (XYLOCAINE) 2 % solution Use as directed 15 mLs in the mouth or throat as needed for mouth pain. 06/27/21   Garlon Hatchet, PA-C  medroxyPROGESTERone (DEPO-PROVERA) 150 MG/ML injection Inject 150 mg into the muscle every 3 (three) months.    [provider]  montelukast (SINGULAIR) 10 MG tablet Take 1 tablet (10 mg total) by mouth at bedtime. 08/30/21   Caccavale, Sophia, PA-C  phenol (CHLORASEPTIC) 1.4 % LIQD Use as directed 1 spray in the mouth or throat as needed for throat irritation / pain. 08/30/21   Caccavale, Sophia, PA-C  promethazine-dextromethorphan (PROMETHAZINE-DM) 6.25-15 MG/5ML syrup Take 2.5 mLs by mouth 3 (three) times daily as needed for cough. 06/29/21   Freddy Finner, NP  Spacer/Aero-Holding Deretha Emory DEVI 1 each by Does not apply route as needed. 07/06/21   Cathlyn Parsons, NP  SUMAtriptan (IMITREX) 100 MG tablet Take 1 tablet (100 mg total) by mouth every 2 (two) hours as needed for migraine. May repeat in 2 hours if headache persists or recurs. 03/09/21 06/01/21  Towanda Octave, MD      Allergies    Patient has no known allergies.    Review of Systems   Review of Systems  Constitutional:  Negative for fever.  Respiratory:  Negative for shortness of breath.   Cardiovascular:  Negative for chest pain.  Musculoskeletal:  Positive for arthralgias.  Skin:  Negative for color change.  All other systems reviewed and are negative.  Physical Exam Updated Vital Signs BP 122/85    Pulse 90    Temp 98.7 F (37.1 C) (Oral)    Resp 14    Ht 4\' 11"  (1.499 m)    Wt 76.2 kg    SpO2 100%    BMI 33.93 kg/m  Physical Exam Vitals and nursing note reviewed.  Constitutional:      General: She is not in acute distress.    Appearance: She is not ill-appearing, toxic-appearing or diaphoretic.  HENT:      Head: Normocephalic and atraumatic.     Nose: Nose normal.     Mouth/Throat:     Mouth: Mucous membranes are moist.  Eyes:     Extraocular Movements: Extraocular movements intact.     Pupils: Pupils are equal, round, and reactive to light.  Cardiovascular:     Rate and Rhythm: Normal rate and regular rhythm.  Pulmonary:     Effort: Pulmonary effort is normal.     Breath sounds: Normal breath sounds. No wheezing.  Abdominal:     General: Abdomen is flat.     Palpations: Abdomen is soft.     Tenderness: There is no abdominal tenderness.  Musculoskeletal:     Cervical back: Normal range of motion and neck supple. No tenderness.     Right knee: Normal.     Left knee: Swelling present. Decreased range of motion.     Comments: Patient has tenderness to palpation of medial left knee.  No ecchymosis noted.  Patient has full range of motion passively to left knee.  Skin:    General: Skin is warm and dry.     Capillary Refill: Capillary refill takes less than 2 seconds.  Neurological:     Mental Status: She is alert and oriented to person, place, and time.    ED Results / Procedures / Treatments   Labs (all labs ordered are listed, but only abnormal results are displayed) Labs Reviewed - No data to display  EKG None  Radiology DG Knee Complete 4 Views Left  Result Date: 09/06/2021 CLINICAL DATA:  Patient heard a pop.  Pain in medial knee. EXAM: LEFT KNEE - COMPLETE 4+ VIEW COMPARISON:  None. FINDINGS: No evidence of fracture, dislocation, or joint effusion. No evidence of arthropathy or other focal bone abnormality. Soft tissues are unremarkable. IMPRESSION: Negative. Electronically Signed   By: 11/04/2021 III M.D.   On: 09/06/2021 17:37    Procedures Procedures    Medications Ordered in ED Medications  lidocaine (LIDODERM) 5 % 1 patch (1 patch Transdermal Patch Applied 09/06/21 1723)  ketorolac (TORADOL) 30 MG/ML injection 30 mg (30 mg Intramuscular Given 09/06/21 1723)     ED Course/ Medical Decision Making/ A&P                           Medical Decision Making Amount and/or Complexity of Data Reviewed Radiology: ordered.  Risk Prescription drug management.   31 year old female presents for evaluation of left knee pain.  Patient has chronic left knee pain, has been seen by orthopedics and physical therapy in the past.  Had MRI conducted in 2020 which did not show any ligamentous injury or tearing.  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  Patient denies any her head or losing consciousness.  On examination, patient has tenderness to palpation of medial left knee, suspect possible meniscal injury however unsure at this time.  Patient was treated utilizing the following: Left knee x-ray, lidocaine patch, Toradol shot.  Left knee film does not show any fractures, signs of arthritis, effusions.  Patient states after application of lidocaine patch as well as Toradol shot she feels much better.  States knee pain has relieved itself.  I feel this patient is stable for discharge at this time.  I have discussed the results of her imaging studies with her and she understands that she will need to follow-up with her orthopedist and reestablish care.  I have provided the patient with return precautions, lidocaine patches, knee immobilizer and crutches.  I have answered all the questions of the patient to her satisfaction.  The patient is stable discharge    Final Clinical Impression(s) / ED Diagnoses Final diagnoses:  Chronic pain of left knee    Rx / DC Orders ED Discharge Orders          Ordered    Apply knee immobilizer  Status:  Canceled       Comments: Left knee   09/06/21 1759    Crutches  Status:  Canceled        09/06/21 1759    lidocaine (LIDODERM) 5 %  Every 24 hours        09/06/21 1801               Al Decant, PA-C 09/06/21 1804    Benjiman Core, MD 09/07/21 [phone]

## 2021-09-06 NOTE — Discharge Instructions (Addendum)
Please return to the ED with any new symptoms such as numbness or tingling to the leg Please follow-up with Dr. Prince Rome on Monday.  Please call and make an appointment to be seen. You can utilize ibuprofen as well as the lidocaine patches I prescribed.  Icing and heating pads to the knee will also help relieve pain.

## 2021-09-06 NOTE — ED Triage Notes (Signed)
Pt reports a knee problem about 3 years ago. Pt began having left knee pain for the past week. Has been taking arthritis tylenol. States her knee gave out today at work. Pain to inside of leg, worse with walking.

## 2021-09-06 NOTE — ED Notes (Signed)
Pt educated on follow up care and discharge instructions.  Pt given crutches and knee immobilizer and educated.  Pt verbalized understanding of care and follow up.

## 2021-09-07 ENCOUNTER — Encounter: Payer: Self-pay | Admitting: Physician Assistant

## 2021-09-07 ENCOUNTER — Ambulatory Visit (INDEPENDENT_AMBULATORY_CARE_PROVIDER_SITE_OTHER): Payer: BC Managed Care – PPO | Admitting: Physician Assistant

## 2021-09-07 ENCOUNTER — Other Ambulatory Visit: Payer: Self-pay

## 2021-09-07 DIAGNOSIS — M25562 Pain in left knee: Secondary | ICD-10-CM | POA: Diagnosis not present

## 2021-09-07 NOTE — Progress Notes (Signed)
Office Visit Note   Patient: Sabrina Brennan           Date of Birth: Sep 20, 1990           MRN: [id number] Visit Date: 09/07/2021              Requested by: Dana Allan, MD [address] [place],  [place] [address] PCP: Dana Allan, MD  Chief Complaint  Patient presents with   Left Knee - Pain      HPI: Patient is a pleasant 31 year old woman with a history of left knee pain.  She last saw Dr. Prince Rome 2021 at which time an MRI did not demonstrate any meniscal or ligament injury.  She did do some physical therapy.  She went to the emergency room over the weekend because she had a painful pop on the medial side of her knee and then was unable to bear weight.  Her knee feels better straight she does not like to bend it.  She says sometimes her knee hurts more in the winter.  She is currently in a knee immobilizer because this feels the best for her weightbearing crutches.  She denies any other joint arthralgias.  She denies any fever or chills.  She denies any family history of inflammatory arthropathy  Assessment & Plan: Visit Diagnoses: No diagnosis found.  Plan: Concerns for medial meniscus tear given her mechanical symptoms of popping and pain.  She can bend her knee as she is quite painful.  She has no effusion today.  I will try a steroid injection to help calm this down.  I do think she most likely will need an MRI if this does not improve in the next week.  Follow-Up Instructions: No follow-ups on file.   Ortho Exam  Patient is alert, oriented, no adenopathy, well-dressed, normal affect, normal respiratory effort. Examination of her left knee she has no effusion no redness no warmth.  She is very hesitant to even let me touch her knee lightly.  Extreme tenderness reproduced over the medial joint line.  Minimal over the lateral joint line no apprehension with patella.  She is guarding quite a bit but has a good endpoint on anterior drawer.  Good varus and valgus  stability  Imaging: DG Knee Complete 4 Views Left  Result Date: 09/06/2021 CLINICAL DATA:  Patient heard a pop.  Pain in medial knee. EXAM: LEFT KNEE - COMPLETE 4+ VIEW COMPARISON:  None. FINDINGS: No evidence of fracture, dislocation, or joint effusion. No evidence of arthropathy or other focal bone abnormality. Soft tissues are unremarkable. IMPRESSION: Negative. Electronically Signed   By: Gerome Sam III M.D.   On: 09/06/2021 17:37   No images are attached to the encounter.  Labs: Lab Results  Component Value Date   HGBA1C 5.6 04/18/2020   REPTSTATUS 02/08/2015 FINAL 02/06/2015   CULT >=100,000 COLONIES/mL ESCHERICHIA COLI 02/06/2015   LABORGA ESCHERICHIA COLI 02/06/2015     Lab Results  Component Value Date   ALBUMIN 4.4 04/18/2020   ALBUMIN 4.4 04/02/2020   ALBUMIN 3.7 12/31/2016    No results found for: MG No results found for: VD25OH  No results found for: PREALBUMIN CBC EXTENDED Latest Ref Rng & Units 05/01/2020 04/18/2020 04/02/2020  WBC 3.4 - 10.8 x10E3/uL 7.2 6.0 7.3  RBC 3.77 - 5.28 x10E6/uL 4.12 4.29 4.20  HGB 11.1 - 15.9 g/dL 59.9 35.7 01.7  HCT 79.3 - 46.6 % 38.0 39.5 38.6  PLT 150 - 450 x10E3/uL - 371  369  NEUTROABS 1.4 - 7.0 x10E3/uL 4.0 2.4 3.7  LYMPHSABS 0.7 - 3.1 x10E3/uL 2.6 2.9 2.9     There is no height or weight on file to calculate BMI.  Orders:  No orders of the defined types were placed in this encounter.  No orders of the defined types were placed in this encounter.    Procedures: No procedures performed  Clinical Data: No additional findings.  ROS:  All other systems negative, except as noted in the HPI. Review of Systems  Objective: Vital Signs: There were no vitals taken for this visit.  Specialty Comments:  No specialty comments available.  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    Hemiplegic migraine without status migrainosus, not intractable 10/29/2020   Chronic migraine without aura without status migrainosus, not intractable 10/29/2020   Chronic migraine with aura 10/29/2020   Acute viral disease 09/25/2020   History of abnormal cervical Pap smear 08/28/2020   Acute asthma exacerbation 08/03/2020   Left ear pain 07/22/2020   Migraine headache without aura 07/03/2020   Right lower quadrant abdominal pain 05/01/2020   Constipation 04/21/2020   High grade squamous intraepithelial lesion (HGSIL), grade 3 CIN, 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   Breast mass in female 08/29/2013   Past Medical History:  Diagnosis Date   Medical history non-contributory    Migraine     Family History  Problem Relation Age of Onset   Healthy Mother    Healthy Father    Breast cancer Maternal Grandmother    Hypertension Maternal Grandmother    Pancreatic cancer Maternal Grandfather    Migraines Maternal Grandfather    Breast cancer Other        Maternal aunt    Past Surgical History:  Procedure Laterality Date   EXCISION MASS LOWER EXTREMETIES Left 12/11/2019   Procedure: EXCISION LEFT THIGH CYST;  Surgeon: Harriette Bouillon, MD;  Location: [facility];  Service: General;  Laterality: Left;   LIPOSUCTION MULTIPLE BODY PARTS     Social History   Occupational History   Not on file  Tobacco Use   Smoking status: Light Smoker   Smokeless tobacco: Never   Tobacco comments:    Hooka 2x monthly  Vaping Use   Vaping Use: Never used  Substance and Sexual Activity   Alcohol use: Yes    Alcohol/week: 4.0 - 5.0 standard drinks    Types: 4 - 5 Glasses of wine per week    Comment: taylor port wine 18%, can drink a whole bottle in a day, last month drank alot of liquor.   Drug use: No   Sexual activity: Yes    Birth control/protection: Injection

## 2021-09-08 ENCOUNTER — Telehealth: Payer: Self-pay | Admitting: Physician Assistant

## 2021-09-08 ENCOUNTER — Other Ambulatory Visit: Payer: Self-pay | Admitting: Physician Assistant

## 2021-09-08 MED ORDER — HYDROCODONE-ACETAMINOPHEN 5-325 MG PO TABS: 1.0000 | ORAL_TABLET | ORAL | 0 refills | Status: DC | PRN

## 2021-09-08 NOTE — Telephone Encounter (Signed)
Pt wondering if she can get something for her pain because its starting to become too much.   CB [phone]

## 2021-09-09 ENCOUNTER — Telehealth: Payer: Self-pay | Admitting: Physician Assistant

## 2021-09-09 ENCOUNTER — Other Ambulatory Visit: Payer: Self-pay | Admitting: Physician Assistant

## 2021-09-09 MED ORDER — HYDROCODONE-ACETAMINOPHEN 5-325 MG PO TABS: 1.0000 | ORAL_TABLET | ORAL | 0 refills | Status: DC | PRN

## 2021-09-09 NOTE — Telephone Encounter (Signed)
Called patient. No answer. LMOM that script has been sent to new pharmacy.

## 2021-09-09 NOTE — Telephone Encounter (Signed)
Patient called advised [facility] pharmacy do not have the Rx Hydrocodone in stock and do not know when they will have it. Patient asked if the Rx can be sent to [facility]? The number to contact patient is [phone]

## 2021-09-09 NOTE — Telephone Encounter (Signed)
done

## 2021-09-10 ENCOUNTER — Telehealth: Payer: Self-pay | Admitting: Physician Assistant

## 2021-09-10 NOTE — Telephone Encounter (Signed)
Phone note 

## 2021-09-15 ENCOUNTER — Other Ambulatory Visit: Payer: Self-pay

## 2021-09-15 ENCOUNTER — Ambulatory Visit (INDEPENDENT_AMBULATORY_CARE_PROVIDER_SITE_OTHER): Payer: BC Managed Care – PPO | Admitting: Physician Assistant

## 2021-09-15 ENCOUNTER — Encounter: Payer: Self-pay | Admitting: Physician Assistant

## 2021-09-15 DIAGNOSIS — M25562 Pain in left knee: Secondary | ICD-10-CM | POA: Diagnosis not present

## 2021-09-15 NOTE — Progress Notes (Signed)
Office Visit Note   Patient: Sabrina Brennan           Date of Birth: 1990/12/02           MRN: [id number] Visit Date: 09/15/2021              Requested by: Carollee Leitz, MD [address],  [place] [address] PCP: Carollee Leitz, MD  Chief Complaint  Patient presents with   Left Knee - Follow-up      HPI: Patient is a pleasant 31 year old woman who is 2 weeks status post large painful popping of her left knee.  Pain was significant enough that she does not feel comfortable bending her knee.  She was also did not get relief from anti-inflammatories icing and rest.  She has used both a knee brace and a knee immobilizer.  She is having significant difficulty bearing weight.  She has not improved at all in the last week and continues to have a painful popping that causes her to lose her balance because of the pain.  She also has some locking sensations.  I am concerned for meniscal injury.  She has not progressed despite conservative treatment.  Assessment & Plan: Visit Diagnoses:  1. Acute pain of left knee     Plan: Left painful mechanical knee symptoms including popping catching that have not improved.  She has difficulty bearing weight and her knee gives out on her.  I do not think it would be appropriate to order physical therapy until more is known about the internal derangement within her knee.  She is having to take a small amount of hydrocodone for the pain.  We will order an MRI and should follow-up once this is completed we will try to get this done as soon as possible patient is willing to travel to alternate MRI facilities  Follow-Up Instructions: No follow-ups on file.   Ortho Exam  Patient is alert, oriented, no adenopathy, well-dressed, normal affect, normal respiratory effort. Left knee mild to moderate soft tissue swelling probably a small amount of effusion.  She has tenderness over the medial joint line and is very hesitant to bend her knee past 30 degrees  because of a painful popping sensation.  She does have some apprehension with patellar palpation.  She also has pain with anterior draw and has some guarding  Imaging: No results found. No images are attached to the encounter.  Labs: Lab Results  Component Value Date   HGBA1C 5.6 04/18/2020   REPTSTATUS 02/08/2015 FINAL 02/06/2015   CULT >=100,000 COLONIES/mL ESCHERICHIA COLI 02/06/2015   LABORGA ESCHERICHIA COLI 02/06/2015     Lab Results  Component Value Date   ALBUMIN 4.4 04/18/2020   ALBUMIN 4.4 04/02/2020   ALBUMIN 3.7 12/31/2016    No results found for: MG No results found for: VD25OH  No results found for: PREALBUMIN CBC EXTENDED Latest Ref Rng & Units 05/01/2020 04/18/2020 04/02/2020  WBC 3.4 - 10.8 x10E3/uL 7.2 6.0 7.3  RBC 3.77 - 5.28 x10E6/uL 4.12 4.29 4.20  HGB 11.1 - 15.9 g/dL 12.8 13.2 13.1  HCT 34.0 - 46.6 % 38.0 39.5 38.6  PLT 150 - 450 x10E3/uL - 371 369  NEUTROABS 1.4 - 7.0 x10E3/uL 4.0 2.4 3.7  LYMPHSABS 0.7 - 3.1 x10E3/uL 2.6 2.9 2.9     There is no height or weight on file to calculate BMI.  Orders:  Orders Placed This Encounter  Procedures   MR Knee Left w/o contrast  No orders of the defined types were placed in this encounter.    Procedures: No procedures performed  Clinical Data: No additional findings.  ROS:  All other systems negative, except as noted in the HPI. Review of Systems  Objective: Vital Signs: There were no vitals taken for this visit.  Specialty Comments:  No specialty comments available.  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   Hemiplegic migraine without status migrainosus, not intractable 10/29/2020   Chronic migraine without aura without status migrainosus, not intractable 10/29/2020   Chronic migraine with aura 10/29/2020   Acute viral disease 09/25/2020   History of  abnormal cervical Pap smear 08/28/2020   Acute asthma exacerbation 08/03/2020   Left ear pain 07/22/2020   Migraine headache without aura 07/03/2020   Right lower quadrant abdominal pain 05/01/2020   Constipation 04/21/2020   High grade squamous intraepithelial lesion (HGSIL), grade 3 CIN, 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   Breast mass in female 08/29/2013   Past Medical History:  Diagnosis Date   Medical history non-contributory    Migraine     Family History  Problem Relation Age of Onset   Healthy Mother    Healthy Father    Breast cancer Maternal Grandmother    Hypertension Maternal Grandmother    Pancreatic cancer Maternal Grandfather    Migraines Maternal Grandfather    Breast cancer Other        Maternal aunt    Past Surgical History:  Procedure Laterality Date   EXCISION MASS LOWER EXTREMETIES Left 12/11/2019   Procedure: EXCISION LEFT THIGH CYST;  Surgeon: Erroll Luna, MD;  Location: [facility];  Service: General;  Laterality: Left;   LIPOSUCTION MULTIPLE BODY PARTS     Social History   Occupational History   Not on file  Tobacco Use   Smoking status: Light Smoker   Smokeless tobacco: Never   Tobacco comments:    Hooka 2x monthly  Vaping Use   Vaping Use: Never used  Substance and Sexual Activity   Alcohol use: Yes    Alcohol/week: 4.0 - 5.0 standard drinks    Types: 4 - 5 Glasses of wine per week    Comment: taylor port wine 18%, can drink a whole bottle in a day, last month drank alot of liquor.   Drug use: No   Sexual activity: Yes    Birth control/protection: Injection

## 2021-09-22 ENCOUNTER — Telehealth: Payer: Self-pay | Admitting: Orthopaedic Surgery

## 2021-09-22 NOTE — Telephone Encounter (Signed)
lmom for pt to return call to sch post MRI after 02/23 with PW

## 2021-09-28 ENCOUNTER — Telehealth: Payer: Self-pay

## 2021-09-28 NOTE — Telephone Encounter (Signed)
Tried to call patient. No answer. Left message that we need to schedule a time for her to come in and see Dr.Whitfield to go over MRI results. Ask her to call the office for scheduling.

## 2021-10-27 NOTE — Progress Notes (Signed)
? ? ?  SUBJECTIVE:  ? ?CHIEF COMPLAINT / HPI:  ? ?BC follow up: Receiving depo injection for [facility] today on 10/29/21. She has had one monogamous sexual partner over last year, no current concern for STI testing. STD exposure: has remote history of STD--does not remember type of STD she had but was treated and resolved.  Patient reports of no current concerning symptoms with no vaginal discharge, bleeding, or pain.  Contraception: Depo-- no side effects at the moment.  ? ?Pityriasis Versicolor: Presence of rash for years that is described as eczema, present all over skin in 2009 once she moved to [place]. KOH positive last year, April 2022. She used Diflucan 150 mg one dose a week for 2 weeks, Ketoconazole cream 2% for lesions and selsun blue shampoo after sole use of Selsun blue treatment failure. During warmer weather, she tends to have a recurrence of the rash. Currently, Small lesions present on her back today.  ? ?Healthcare Maintenance: in need of pap smear--Hx of LEEP for HGSIL, last pap smear 10/21 ? ? ?  10/29/2021  ?  8:31 AM 03/09/2021  ? 11:04 AM 01/09/2021  ?  9:29 AM  ?PHQ9 SCORE ONLY  ?PHQ-9 Total Score 0 0 0  ? ?PERTINENT  PMH / PSH:  ?Past Medical History:  ?Diagnosis Date  ? Medical history non-contributory   ? Migraine   ? ? ?OBJECTIVE:  ? ?BP 110/80   Pulse 82   Ht 4\' 11"  (1.499 m)   Wt 169 lb 6 oz (76.8 kg)   SpO2 99%   BMI 34.21 kg/m?   ?General: Alert and oriented in no apparent distress, pleasant female  ?Heart: Regular rate and rhythm with no murmurs appreciated ?Abdomen: no abdominal pain or distension ?Skin: Warm and dry with rash present on back-- several small hyperpigmented lesions approx 1 cm over upper and middle back, photo in media tab  ?GU: scant white discharge in vaginal vault, cervix visualized with no apparent lesions or ulcerations. No evidence of vaginal bleeding. No abnormalities with labial folds, no visualized lesions externally  ?Chaperones present during examination: Dr.  and Terisa Starr, [name]   ? ?ASSESSMENT/PLAN:  ? ?Updated problem list and medication list.  ? ?Uses birth control ?Depo given in office today 10/29/21 without complications  ? ?History of abnormal cervical Pap smear ?Given history of HGSIL requiring LEEP procedure, high risk HPV but not 16/18/45, repeat pap smear performed today. No obvious abnormalities during examination, patient tolerated well. Continue with HIV, RPR, GC/Chlamydia testing, wet prep after discussion with patient. With shared decision making, we decided to continue with routine STI testing although patient is low risk. Will contact patient with results. Upreg negative.  ? ?Pityriasis versicolor ?Recurrence of rash started with the warmer weather, and patient has not been using the Selsun blue shampoo since treatment last year. Given that she has previously failed Selsun blue treatment and found great success with previous regimen she had been given, will continue with same regimen. Diflucan 1 tab weekly 150 mg for a total of 2 weeks, ketoconazole cream 2% on small lesions. Selsun blue as well for ppx during warmer or humid weather. She should return of symptoms continue and do not improve.  ?  ?[birth year], MD ?[facility]  ? ?

## 2021-10-29 ENCOUNTER — Ambulatory Visit (INDEPENDENT_AMBULATORY_CARE_PROVIDER_SITE_OTHER): Payer: BC Managed Care – PPO | Admitting: Student

## 2021-10-29 ENCOUNTER — Other Ambulatory Visit (HOSPITAL_COMMUNITY)
Admission: RE | Admit: 2021-10-29 | Discharge: 2021-10-29 | Disposition: A | Discharge status: Home / Self Care | Payer: BC Managed Care – PPO | Source: Ambulatory Visit | Attending: Family Medicine | Admitting: Family Medicine

## 2021-10-29 ENCOUNTER — Encounter: Payer: Self-pay | Admitting: Student

## 2021-10-29 VITALS — BP 110/80 | HR 82 | Ht 59.0 in | Wt 169.4 lb

## 2021-10-29 DIAGNOSIS — B360 Pityriasis versicolor: Secondary | ICD-10-CM

## 2021-10-29 DIAGNOSIS — Z8742 Personal history of other diseases of the female genital tract: Secondary | ICD-10-CM | POA: Diagnosis present

## 2021-10-29 DIAGNOSIS — Z789 Other specified health status: Secondary | ICD-10-CM

## 2021-10-29 DIAGNOSIS — R87610 Atypical squamous cells of undetermined significance on cytologic smear of cervix (ASC-US): Secondary | ICD-10-CM | POA: Insufficient documentation

## 2021-10-29 LAB — POCT WET PREP (WET MOUNT)
Clue Cells Wet Prep Whiff POC: NEGATIVE
Trichomonas Wet Prep HPF POC: ABSENT

## 2021-10-29 LAB — POCT URINE PREGNANCY: Preg Test, Ur: NEGATIVE

## 2021-10-29 MED ORDER — MEDROXYPROGESTERONE ACETATE 150 MG/ML IM SUSP
150.0000 mg | Freq: Once | INTRAMUSCULAR | Status: AC
  Administered 2021-10-29: 150 mg via INTRAMUSCULAR

## 2021-10-29 MED ORDER — SELSUN BLUE FULL & THICK 1 % EX SHAM: MEDICATED_SHAMPOO | Freq: Every day | CUTANEOUS | 12 refills | Status: DC | PRN

## 2021-10-29 MED ORDER — KETOCONAZOLE 2 % EX CREA: 1.0000 "application " | TOPICAL_CREAM | Freq: Every day | CUTANEOUS | 0 refills | Status: DC

## 2021-10-29 MED ORDER — FLUCONAZOLE 150 MG PO TABS: 150.0000 mg | ORAL_TABLET | ORAL | 0 refills | Status: AC

## 2021-10-29 NOTE — Assessment & Plan Note (Addendum)
Recurrence of rash started with the warmer weather, and patient has not been using the Selsun blue shampoo since treatment last year. Given that she has previously failed Selsun blue treatment and found great success with previous regimen she had been given, will continue with same regimen. Diflucan 1 tab weekly 150 mg for a total of 2 weeks, ketoconazole cream 2% on small lesions. Selsun blue as well for ppx during warmer or humid weather. She should return of symptoms continue and do not improve.  ?

## 2021-10-29 NOTE — Patient Instructions (Addendum)
It was nice to meet you today! ? ?-We will try diflucan with 150 mg 1 dose this week, followed by one other dose next week.  ?-Also will use ketoconazole 2% cr for small lesions that appear after this treatment.  ?-I recommend using selsyn blue shampoo once a week during warm weather to help prevent recurrence ?-I will mychart message you or call with all results once they come in  ? ? ?Have a nice day! ?Avyn Aden Geophysical data processor  ?

## 2021-10-29 NOTE — Assessment & Plan Note (Signed)
Depo given in office today [phone] without complications  ?

## 2021-10-29 NOTE — Assessment & Plan Note (Signed)
Given history of HGSIL requiring LEEP procedure, high risk HPV but not 16/18/45, repeat pap smear performed today. No obvious abnormalities during examination, patient tolerated well. Continue with HIV, RPR, GC/Chlamydia testing, wet prep after discussion with patient. With shared decision making, we decided to continue with routine STI testing although patient is low risk. Will contact patient with results. Upreg negative.  ?

## 2021-10-30 LAB — HIV ANTIBODY (ROUTINE TESTING W REFLEX): HIV Screen 4th Generation wRfx: NONREACTIVE

## 2021-10-30 LAB — RPR: RPR Ser Ql: NONREACTIVE

## 2021-11-04 ENCOUNTER — Other Ambulatory Visit: Payer: Self-pay | Admitting: Student

## 2021-11-04 DIAGNOSIS — A599 Trichomoniasis, unspecified: Secondary | ICD-10-CM

## 2021-11-04 LAB — CYTOLOGY - PAP
Chlamydia: NEGATIVE
Comment: NEGATIVE
Comment: NEGATIVE
Comment: NORMAL
Diagnosis: UNDETERMINED — AB
High risk HPV: NEGATIVE
Neisseria Gonorrhea: NEGATIVE

## 2021-11-04 MED ORDER — METRONIDAZOLE 500 MG PO TABS: 500.0000 mg | ORAL_TABLET | Freq: Two times a day (BID) | ORAL | 0 refills | Status: AC

## 2021-11-08 ENCOUNTER — Encounter: Payer: Self-pay | Admitting: Student

## 2021-11-10 ENCOUNTER — Other Ambulatory Visit: Payer: Self-pay | Admitting: Student

## 2021-11-10 DIAGNOSIS — B379 Candidiasis, unspecified: Secondary | ICD-10-CM

## 2021-11-10 MED ORDER — FLUCONAZOLE 150 MG PO TABS: 150.0000 mg | ORAL_TABLET | Freq: Once | ORAL | 0 refills | Status: AC

## 2021-11-10 NOTE — Progress Notes (Signed)
Diflucan sent in for yeast infection after steroid use.  ?

## 2021-11-23 ENCOUNTER — Other Ambulatory Visit: Payer: Self-pay

## 2021-11-23 ENCOUNTER — Other Ambulatory Visit: Payer: Self-pay | Admitting: Student

## 2021-11-23 ENCOUNTER — Encounter: Payer: Self-pay | Admitting: Family Medicine

## 2021-11-23 ENCOUNTER — Ambulatory Visit (INDEPENDENT_AMBULATORY_CARE_PROVIDER_SITE_OTHER): Payer: BC Managed Care – PPO | Admitting: Family Medicine

## 2021-11-23 VITALS — BP 111/79 | HR 83 | Wt 166.0 lb

## 2021-11-23 DIAGNOSIS — N898 Other specified noninflammatory disorders of vagina: Secondary | ICD-10-CM | POA: Diagnosis not present

## 2021-11-23 LAB — POCT WET PREP (WET MOUNT)
Clue Cells Wet Prep Whiff POC: NEGATIVE
Trichomonas Wet Prep HPF POC: ABSENT

## 2021-11-23 MED ORDER — FLUCONAZOLE 150 MG PO TABS: 150.0000 mg | ORAL_TABLET | Freq: Every day | ORAL | 0 refills | Status: DC

## 2021-11-23 NOTE — Progress Notes (Signed)
? ? ?  SUBJECTIVE:  ? ?CHIEF COMPLAINT / HPI: Concern for yeast infection ? ?Patient reports completing antibiotic course.  She states that at this time was able to take Diflucan to try and prevent yeast infection.  Since then, she is experience white vaginal discharge and a moderate amount of vulvar irritation.  She reports mild pruritus.  Patient declines STI screening today stating that she was tested in the last 4 weeks.  Patient uses Depo-Provera injections for contraception. ? ?PERTINENT  PMH / PSH:  ?Noncontributory ? ?OBJECTIVE:  ? ?BP 111/79   Pulse 83   Wt 166 lb (75.3 kg)   SpO2 100%   BMI 33.53 kg/m?   ?Genitalia:  Normal introitus for age, no external lesions, erythematous labia majora, copious white vaginal discharge, mucosa pink and moist, no vaginal or cervical lesions, no vaginal atrophy, no friaility or hemorrhage ? ?ASSESSMENT/PLAN:  ? ?Vaginal discharge ?White discharge visualized on speculum exam highly suspicious for vaginal candidiasis ?We will prescribe Diflucan 150 mg 2 tablets with the second tablet to be repeated after 72 hours ?Also recommended over-the-counter Monistat for vulvar irritation ?  ? ? ?Ronnald Ramp, MD ?[facility]  ?

## 2021-11-23 NOTE — Assessment & Plan Note (Signed)
White discharge visualized on speculum exam highly suspicious for vaginal candidiasis ?We will prescribe Diflucan 150 mg 2 tablets with the second tablet to be repeated after 72 hours ?Also recommended over-the-counter Monistat for vulvar irritation ?

## 2021-11-23 NOTE — Patient Instructions (Addendum)
I will prescribe Diflucan.  Please take the first tablet today and the second tablet after 3 days. ? ?I recommend using over-the-counter Monistat to help with the irritation. ? ?I will notify you of any abnormal results once they are available. ? ? ?

## 2021-11-24 ENCOUNTER — Encounter: Payer: Self-pay | Admitting: Family Medicine

## 2021-12-01 ENCOUNTER — Ambulatory Visit (INDEPENDENT_AMBULATORY_CARE_PROVIDER_SITE_OTHER): Payer: BC Managed Care – PPO | Admitting: Family Medicine

## 2021-12-01 ENCOUNTER — Ambulatory Visit (HOSPITAL_COMMUNITY)
Admission: EM | Admit: 2021-12-01 | Discharge: 2021-12-01 | Discharge status: Absent without leave | Payer: BC Managed Care – PPO

## 2021-12-01 ENCOUNTER — Ambulatory Visit: Payer: BC Managed Care – PPO

## 2021-12-01 DIAGNOSIS — H109 Unspecified conjunctivitis: Secondary | ICD-10-CM | POA: Insufficient documentation

## 2021-12-01 DIAGNOSIS — H1032 Unspecified acute conjunctivitis, left eye: Secondary | ICD-10-CM

## 2021-12-01 MED ORDER — ERYTHROMYCIN 5 MG/GM OP OINT: 1.0000 "application " | TOPICAL_OINTMENT | Freq: Two times a day (BID) | OPHTHALMIC | 0 refills | Status: DC

## 2021-12-01 NOTE — Progress Notes (Signed)
Eye  

## 2021-12-01 NOTE — Patient Instructions (Addendum)
It was great seeing you today. We are sorry to hear about the discomfort in your eye. ? ?We believe you have conjunctivitis (inflammation of the outer tissue of your eye) and a bit of blepharitis (inflammation of your eyelid). This may be caused by a virus or bacteria, so we will treat it with antibiotics to be safe. We are prescribing you an erythromycin antibiotic ointment to use twice daily. Please also continue to do warm compresses at home to help manage the symptoms. You can also take Tylenol to help with pain as needed. ? ?We will see you back tomorrow to look at your eye again and determine if the treatment is helping or if we need to do something else.  Please schedule this appointment on your way out. ?

## 2021-12-01 NOTE — Progress Notes (Addendum)
? ? ?SUBJECTIVE:  ? ?CHIEF COMPLAINT / HPI:  ?Sabrina Brennan is a 31 y.o. female with a past medical history of intractable migraines presenting to the clinic for left-sided "pinkeye." ? ?1. Acute conjunctivitis of left eye, unspecified acute conjunctivitis type ?Starting this past Sunday, the patient reports that she has been experiencing "pressure like there is something there" in her left eye. She denies any history of eye trauma or injury. Starting this morning, her left eye has become swollen, red, and has become more painful and itchy. 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 She has been rubbing and itching the eye the past few days. She endorses morning crusting and stickiness in her left eye, but denies daytime eye discharge. She describes feeling like there may be something getting in the way of her seeing on the left side but is unsure if it feels like a foreign body. She has used warm and cold compresses as well as Tylenol, none of which have provided much relief. ? ?On review of systems, the patient denies fever, weakness, cough, chills, rhinorrhea, sneezing, eye bleeding, vision darkening/loss, and diplopia. She endorses some headaches (chronic and being followed by neurology), which she treated with Excedrin. She notes acute onset of left-sided blurriness today and describes needing glasses for the past year for both eyes. She has an optometry appointment scheduled for Thursday to get glasses. ? ?PERTINENT  PMH / PSH: ?-- History of year-round environmental allergies treated with Allegra and Claritin ?--  ?-- No PMH of diabetes and no family history of diabetes ? ?Substance and Sexual Activity  ? Alcohol use: Yes  ?  Alcohol/week: 4.0 - 5.0 standard drinks  ?  Types: 4 - 5 Glasses of wine per week  ?  Comment: taylor port wine 18%, can drink a whole bottle in a day, last month drank  alot of liquor.  ? ?OBJECTIVE:  ? ?BP (!) 120/99   Pulse 68   Ht 4\' 11"  (1.499 m)   Wt 167 lb 9.6 oz (76 kg)   SpO2 100%   BMI 33.85 kg/m?   ? ?General: Age-appropriate, resting comfortably in chair, NAD, WNWD, alert and at baseline. ?HEENT: NCAT. PERRLA. Left sclera with mild peripheral conjunctival injections (medial > lateral). Left blepharitis (image below). Patient finds it difficult to keep her left eye open during exam and it waters significantly. No scleral icterus. MMM. ?Neck: Supple. No LAD, thyroid smooth and not palpable. ?Cardiovascular: Regular rate and rhythm. Normal S1/S2. No murmurs, rubs, or gallops appreciated. 2+ radial pulses. ?Pulmonary: Clear bilaterally to ascultation. No increased WOB, no accessory muscle usage. No wheezes, rales, or crackles. ?Abdominal: Normoactive bowel sounds. No tenderness to deep or light palpation. No rebound or guarding. No HSM. ?Extremities: No peripheral edema bilaterally, skin warm and dry. ?Psych: Pleasant and appropriate. ? ?Eye exam: ?L Eye: 20/50 ?R Eye: 20/40 ?Both: 20/40 ? ?  ?ASSESSMENT/PLAN:  ? ?Sabrina Brennan is a 31 y.o. female with a past medical history of intractable migraines as well as findings of left-sided conjunctivitis and blepharitis on physical exam presenting to the clinic for acute onset conjunctivitis. ? ?Conjunctivitis of left eye ?Physical exam findings are consistent with mild left eye conjunctivitis with associated blepharitis. The lack of iris-side conjunctival injections, vision loss, or diplopia are reassuring that immediate ophthalmology referral is not necessary. The patient's recent exposure to sick  contacts (children) at school where she works, an infectious etiology is likely. It is not possible to tell from physical exam whether the etiology is viral or bacterial, however. Given the patient's significant discomfort, difficulty keeping her eye open, and extensive blepharitis as well as vision blurring, empiric antibiotic  treatment is reasonable to prevent acute complications in the event of a bacterial infection. Prescribed erythromycin ophthalmic ointment twice daily and scheduled follow up appointment for tomorrow. Will reevaluate need for a systemic antibiotic and/or ophthalmology referral at follow up appointment. Recommended continuing Tylenol for pain management and warm compresses for acute symptom management. Provided precautions to seek emergency care before then if vision darkens, sight significantly worsens, pain becomes severe, or other concerning symptoms arise. ? ?[facility] doctor, Medical Student ?[facility]   ? ?Resident Attestation ? ?I saw and evaluated the patient, performing the key elements of the service.I  personally performed or re-performed the history, physical exam, and medical decision making activities of this service and have verified that the service and findings are accurately documented in the student's note. I developed the management plan that is described in the medical student's note, and I agree with the content, with my edits above.  ?  ?Derrel Nip, PGY3 ? ?

## 2021-12-01 NOTE — Assessment & Plan Note (Addendum)
Physical exam findings are consistent with mild left eye conjunctivitis with associated blepharitis. The lack of iris-side conjunctival injections, vision loss, or diplopia are reassuring that immediate ophthalmology referral is not necessary. 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 It is not possible to tell from physical exam whether the etiology is viral or bacterial, however. Given the patient's significant discomfort, difficulty keeping her eye open, and extensive blepharitis as well as vision blurring, empiric antibiotic treatment is reasonable to prevent acute complications in the event of a bacterial infection. Prescribed erythromycin ophthalmic ointment twice daily and scheduled follow up appointment for tomorrow. Will reevaluate need for a systemic antibiotic and/or ophthalmology referral at follow up appointment. Recommended continuing Tylenol for pain management and warm compresses for acute symptom management. Provided precautions to seek emergency care before then if vision darkens, sight significantly worsens, pain becomes severe, or other concerning symptoms arise. ?

## 2021-12-02 ENCOUNTER — Ambulatory Visit (INDEPENDENT_AMBULATORY_CARE_PROVIDER_SITE_OTHER): Payer: BC Managed Care – PPO | Admitting: Family Medicine

## 2021-12-02 VITALS — BP 110/75 | HR 86 | Ht 59.0 in | Wt 167.4 lb

## 2021-12-02 DIAGNOSIS — H01004 Unspecified blepharitis left upper eyelid: Secondary | ICD-10-CM | POA: Diagnosis not present

## 2021-12-02 MED ORDER — CETIRIZINE HCL 10 MG PO TABS: 10.0000 mg | ORAL_TABLET | Freq: Every day | ORAL | 11 refills | Status: DC

## 2021-12-02 NOTE — Progress Notes (Signed)
? ? ?  SUBJECTIVE:  ? ?CHIEF COMPLAINT / HPI:  ? ?Patient reports that she was seen yesterday and was given an erythromycin ointment.  She is using it but has not had any change in her symptoms.  She denies any worsening of her blurred vision or pain.  She reports that her eye has not been red at all. ? 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 ? ?PERTINENT  PMH / PSH: Reviewed ? ?OBJECTIVE:  ? ?BP 110/75   Pulse 86   Ht 4\' 11"  (1.499 m)   Wt 167 lb 6 oz (75.9 kg)   SpO2 100%   BMI 33.81 kg/m?   ?General: NAD, well-appearing, well-nourished ?Respiratory: No respiratory distress, breathing comfortably, able to speak in full sentences ?Skin: warm and dry, no rashes noted on exposed skin ?Psych: Appropriate affect and mood ?HEENT: Bilateral conjunctive a normal.  Increased swelling along the left upper eyelid/eyelash line, no significant erythema, tender to palpation.  EOMI intact, pain mainly with manipulation of the eyelid. ? ?ASSESSMENT/PLAN:  ? ?Blepharitis versus acute conjunctivitis ?No obvious hordeolum or chalazion.  Consideration for a blepharitis, does not appear to be consistent with a cellulitis at this time.  Patient is already using topical erythromycin.  No worsening of symptoms at this time. ?- Continue erythromycin ointment ?- Follow-up with eye doctor appointment on 5/8 ?- Follow-up in the next week if symptoms do not improve ?- Can consider tea oil scrubs if concern for Demodex mites ?- Encourage good hand hygiene, especially when touching her eye or face in general. ? ? ? ?Josslyn Ciolek, DO ?[place]  ?

## 2021-12-02 NOTE — Patient Instructions (Addendum)
Make sure to have good handwashing especially after you touch your eye or before you touch your face if you have touched anything else. I think it is fine to go back to work tomorrow. ? ?- For the itching I think we are going to try Zyrtec and he can also use Benadryl, especially at night as it makes people sleepy sometimes. ? ?- Keep doing the ointment and follow-up with our clinic if it is not better in the next week or so. ? ?-If this is not getting better, you can always consider doing once weekly tea oil eye scrubs (you can search this online) that can help if there is any type of concern for a mite.  I would wait until after you stop the antibiotics before we need to consider doing this. ? ?- Keep your eye doctor appointment and see if they have anything they are concerned about. ? ?

## 2021-12-03 ENCOUNTER — Telehealth: Payer: BC Managed Care – PPO | Admitting: Physician Assistant

## 2021-12-03 DIAGNOSIS — N760 Acute vaginitis: Secondary | ICD-10-CM | POA: Diagnosis not present

## 2021-12-03 MED ORDER — FLUCONAZOLE 150 MG PO TABS: ORAL_TABLET | ORAL | 0 refills | Status: DC

## 2021-12-03 NOTE — Progress Notes (Signed)
?Virtual Visit Consent  ? ?Sabrina Brennan, you are scheduled for a virtual visit with a  provider today. Just as with appointments in the office, your consent must be obtained to participate. Your consent will be active for this visit and any virtual visit you may have with one of our providers in the next 365 days. If you have a MyChart account, a copy of this consent can be sent to you electronically. ? ?As this is a virtual visit, video technology does not allow for your provider to perform a traditional examination. This may limit your provider's ability to fully assess your condition. If your provider identifies any concerns that need to be evaluated in person or the need to arrange testing (such as labs, EKG, etc.), we will make arrangements to do so. Although advances in technology are sophisticated, we cannot ensure that it will always work on either your end or our end. If the connection with a video visit is poor, the visit may have to be switched to a telephone visit. With either a video or telephone visit, we are not always able to ensure that we have a secure connection. ? ?By engaging in this virtual visit, you consent to the provision of healthcare and authorize for your insurance to be billed (if applicable) for the services provided during this visit. Depending on your insurance coverage, you may receive a charge related to this service. ? ?I need to obtain your verbal consent now. Are you willing to proceed with your visit today? Sabrina Routeyasia A Starzyk has provided verbal consent on 12/03/2021 for a virtual visit (video or telephone). Piedad ClimesWilliam Cody Tryce Surratt, PA-C ? ?Date: 12/03/2021 5:13 PM ? ?Virtual Visit via Video Note  ? ?[name], Argus Caraher Cody Merna Baldi, connected with  Sabrina Routeyasia A Mclean  ([id number], Dec 23, 1990) on 12/03/21 at  5:15 PM EDT by a video-enabled telemedicine application and verified that I am speaking with the correct person using two identifiers. ? ?Location: ?Patient: Virtual Visit Location  Patient: Home ?Provider: Virtual Visit Location Provider: Home Office ?  ?I discussed the limitations of evaluation and management by telemedicine and the availability of in person appointments. The patient expressed understanding and agreed to proceed.   ? ?History of Present Illness: ?Sabrina Routeyasia A Wale is a 31 y.o. who identifies as a female who was assigned female at birth, and is being seen today for possible recurrence of a yeast vaginitis. She as recently treated with a two pill course of diflucan for vaginal candida confirmed by wet prep. Notes finishing course with good response. Started summer's even wash and soon after started noting vaginal itching and irritation with occasional white discharge. Is on DepoProvera. Denies concern for STI. Her wet prep was negative for additional findings. Denies any urinary urgency, frequency, dysuria.  ? ? ?HPI: HPI  ?Problems:  ?Patient Active Problem List  ? Diagnosis Date Noted  ? Conjunctivitis of left eye 12/01/2021  ? Vaginal discharge 11/23/2021  ? Uses birth control 10/29/2021  ? Pityriasis versicolor 11/26/2020  ? 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 History of abnormal cervical Pap smear 08/28/2020  ? High grade squamous intraepithelial lesion (HGSIL), grade 3 CIN, 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  ?  ?Allergies: No Known Allergies ?Medications:  ?Current Outpatient Medications:  ?  fluconazole (DIFLUCAN) 150 MG tablet, Take 1 tablet by mouth on Day 1, Day 4 and Day 7., Disp: 3 tablet, Rfl: 0 ?  albuterol (VENTOLIN HFA) 108 (90 Base) MCG/ACT inhaler, Inhale  2 puffs into the lungs every 6 (six) hours as needed for wheezing or shortness of breath. (Patient not taking: Reported on 10/29/2021), Disp: 8 g, Rfl: 0 ?  benzonatate (TESSALON) 100 MG capsule, Take 1 capsule (100 mg total) by mouth every 8 (eight) hours. (Patient not taking: Reported on 10/29/2021), Disp: 21 capsule, Rfl: 0 ?  cetirizine (ZYRTEC) 10 MG tablet, Take 1  tablet (10 mg total) by mouth daily., Disp: 30 tablet, Rfl: 11 ?  erythromycin ophthalmic ointment, Place 1 application. into the left eye 2 (two) times daily. For 5 days, Disp: 3.5 g, Rfl: 0 ?  Galcanezumab-gnlm (EMGALITY) 120 MG/ML SOAJ, Initiate therapy Loading dose of 240mg  then after 30 days 120mg   every 30 days. (Patient not taking: Reported on 10/29/2021), Disp: 2.26 mL, Rfl: 5 ?  HYDROcodone-acetaminophen (NORCO/VICODIN) 5-325 MG tablet, Take 1 tablet by mouth every 4 (four) hours as needed for moderate pain. (Patient not taking: Reported on 10/29/2021), Disp: 20 tablet, Rfl: 0 ?  ketoconazole (NIZORAL) 2 % cream, Apply 1 application. topically daily. Apply to the small lesions on the back, Disp: 15 g, Rfl: 0 ?  lidocaine (LIDODERM) 5 %, Place 1 patch onto the skin daily. Remove & Discard patch within 12 hours or as directed by MD (Patient not taking: Reported on 10/29/2021), Disp: 10 patch, Rfl: 0 ?  lidocaine (XYLOCAINE) 2 % solution, Use as directed 15 mLs in the mouth or throat as needed for mouth pain. (Patient not taking: Reported on 10/29/2021), Disp: 150 mL, Rfl: 0 ?  medroxyPROGESTERone (DEPO-PROVERA) 150 MG/ML injection, Inject 150 mg into the muscle every 3 (three) months., Disp: , Rfl:  ?  montelukast (SINGULAIR) 10 MG tablet, Take 1 tablet (10 mg total) by mouth at bedtime. (Patient not taking: Reported on 10/29/2021), Disp: 14 tablet, Rfl: 0 ?  phenol (CHLORASEPTIC) 1.4 % LIQD, Use as directed 1 spray in the mouth or throat as needed for throat irritation / pain. (Patient not taking: Reported on 10/29/2021), Disp: 177 mL, Rfl: 0 ?  promethazine-dextromethorphan (PROMETHAZINE-DM) 6.25-15 MG/5ML syrup, Take 2.5 mLs by mouth 3 (three) times daily as needed for cough. (Patient not taking: Reported on 10/29/2021), Disp: 118 mL, Rfl: 0 ?  pyrithione zinc (SELSUN BLUE FULL & THICK) 1 % shampoo, Apply topically daily as needed for itching., Disp: 400 mL, Rfl: 12 ?  Spacer/Aero-Holding Chambers DEVI, 1 each by  Does not apply route as needed. (Patient not taking: Reported on 10/29/2021), Disp: 1 each, Rfl: 0 ?  SUMAtriptan (IMITREX) 100 MG tablet, Take 1 tablet (100 mg total) by mouth every 2 (two) hours as needed for migraine. May repeat in 2 hours if headache persists or recurs., Disp: 30 tablet, Rfl: 3 ? ?Current Facility-Administered Medications:  ?  medroxyPROGESTERone (DEPO-PROVERA) injection 150 mg, 150 mg, Intramuscular, Once, 10/31/2021, MD ? ?Observations/Objective: ?Patient is well-developed, well-nourished in no acute distress.  ?Resting comfortably at home.  ?Head is normocephalic, atraumatic.  ?No labored breathing. ?Speech is clear and coherent with logical content.  ?Patient is alert and oriented at baseline.  ? ?Assessment and Plan: ?1. Recurrent vaginitis ?- fluconazole (DIFLUCAN) 150 MG tablet; Take 1 tablet by mouth on Day 1, Day 4 and Day 7.  Dispense: 3 tablet; Refill: 0 ? ?Seems like recurrence due to irritation from dyed/scented soap applied to the area that is new for patient. No odorous discharge to raise concern at present for a BV. Will give 3 dose course of Diflucan over 7 day period. Needs  in-person follow-up with PCP for any lingering or recurring symptoms as this would warrant repeat examination and swabbing. Discussed proper vaginal hygiene and avoidance of dyed/scented products due to affect on vaginal flora and pH.  ? ?Follow Up Instructions: ?I discussed the assessment and treatment plan with the patient. The patient was provided an opportunity to ask questions and all were answered. The patient agreed with the plan and demonstrated an understanding of the instructions.  A copy of instructions were sent to the patient via MyChart unless otherwise noted below.  ? ?The patient was advised to call back or seek an in-person evaluation if the symptoms worsen or if the condition fails to improve as anticipated. ? ?Time:  ?I spent 10 minutes with the patient via telehealth technology  discussing the above problems/concerns.   ? ?[name], PA-C ?

## 2021-12-03 NOTE — Patient Instructions (Signed)
?Sabrina Brennan, thank you for joining [name], PA-C for today's virtual visit.  While this provider is not your primary care provider (PCP), if your PCP is located in our provider database this encounter information will be shared with them immediately following your visit. ? ?Consent: ?(Patient) Sabrina Brennan provided verbal consent for this virtual visit at the beginning of the encounter. ? ?Current Medications: ? ?Current Outpatient Medications:  ?  albuterol (VENTOLIN HFA) 108 (90 Base) MCG/ACT inhaler, Inhale 2 puffs into the lungs every 6 (six) hours as needed for wheezing or shortness of breath. (Patient not taking: Reported on 10/29/2021), Disp: 8 g, Rfl: 0 ?  benzonatate (TESSALON) 100 MG capsule, Take 1 capsule (100 mg total) by mouth every 8 (eight) hours. (Patient not taking: Reported on 10/29/2021), Disp: 21 capsule, Rfl: 0 ?  cetirizine (ZYRTEC) 10 MG tablet, Take 1 tablet (10 mg total) by mouth daily., Disp: 30 tablet, Rfl: 11 ?  erythromycin ophthalmic ointment, Place 1 application. into the left eye 2 (two) times daily. For 5 days, Disp: 3.5 g, Rfl: 0 ?  fluconazole (DIFLUCAN) 150 MG tablet, Take 1 tablet (150 mg total) by mouth daily., Disp: 2 tablet, Rfl: 0 ?  Galcanezumab-gnlm (EMGALITY) 120 MG/ML SOAJ, Initiate therapy Loading dose of 240mg  then after 30 days 120mg   every 30 days. (Patient not taking: Reported on 10/29/2021), Disp: 2.26 mL, Rfl: 5 ?  HYDROcodone-acetaminophen (NORCO/VICODIN) 5-325 MG tablet, Take 1 tablet by mouth every 4 (four) hours as needed for moderate pain. (Patient not taking: Reported on 10/29/2021), Disp: 20 tablet, Rfl: 0 ?  ketoconazole (NIZORAL) 2 % cream, Apply 1 application. topically daily. Apply to the small lesions on the back, Disp: 15 g, Rfl: 0 ?  lidocaine (LIDODERM) 5 %, Place 1 patch onto the skin daily. Remove & Discard patch within 12 hours or as directed by MD (Patient not taking: Reported on 10/29/2021), Disp: 10 patch, Rfl: 0 ?  lidocaine  (XYLOCAINE) 2 % solution, Use as directed 15 mLs in the mouth or throat as needed for mouth pain. (Patient not taking: Reported on 10/29/2021), Disp: 150 mL, Rfl: 0 ?  medroxyPROGESTERone (DEPO-PROVERA) 150 MG/ML injection, Inject 150 mg into the muscle every 3 (three) months., Disp: , Rfl:  ?  montelukast (SINGULAIR) 10 MG tablet, Take 1 tablet (10 mg total) by mouth at bedtime. (Patient not taking: Reported on 10/29/2021), Disp: 14 tablet, Rfl: 0 ?  phenol (CHLORASEPTIC) 1.4 % LIQD, Use as directed 1 spray in the mouth or throat as needed for throat irritation / pain. (Patient not taking: Reported on 10/29/2021), Disp: 177 mL, Rfl: 0 ?  promethazine-dextromethorphan (PROMETHAZINE-DM) 6.25-15 MG/5ML syrup, Take 2.5 mLs by mouth 3 (three) times daily as needed for cough. (Patient not taking: Reported on 10/29/2021), Disp: 118 mL, Rfl: 0 ?  pyrithione zinc (SELSUN BLUE FULL & THICK) 1 % shampoo, Apply topically daily as needed for itching., Disp: 400 mL, Rfl: 12 ?  Spacer/Aero-Holding Chambers DEVI, 1 each by Does not apply Brennan as needed. (Patient not taking: Reported on 10/29/2021), Disp: 1 each, Rfl: 0 ?  SUMAtriptan (IMITREX) 100 MG tablet, Take 1 tablet (100 mg total) by mouth every 2 (two) hours as needed for migraine. May repeat in 2 hours if headache persists or recurs., Disp: 30 tablet, Rfl: 3 ? ?Current Facility-Administered Medications:  ?  medroxyPROGESTERone (DEPO-PROVERA) injection 150 mg, 150 mg, Intramuscular, Once, 10/31/2021, MD  ? ?Medications ordered in this encounter:  ?No orders  of the defined types were placed in this encounter. ?  ? ?*If you need refills on other medications prior to your next appointment, please contact your pharmacy* ? ?Follow-Up: ?Call back or seek an in-person evaluation if the symptoms worsen or if the condition fails to improve as anticipated. ? ?Other Instructions ?Please take the medication as directed. ?Avoid using any scented/dyed soaps or lotions in the area. ?Start a  women's health probiotic (AZO make a good one of these). ?Follow-up with your PCP if not resolving or any recurrence.  ? ? ?If you have been instructed to have an in-person evaluation today at a local Urgent Care facility, please use the link below. It will take you to a list of all of our available [facility], including address, phone number and hours of operation. Please do not delay care.  ?[facility] ? ?If you or a family member do not have a primary care provider, use the link below to schedule a visit and establish care. When you choose a [facility] primary care physician or advanced practice provider, you gain a long-term partner in health. ?Find a Primary Care Provider ? ?Learn more about [facility]'s in-office and virtual care options: ?[facility] - Get Care Now  ?

## 2022-01-03 ENCOUNTER — Telehealth: Payer: BC Managed Care – PPO | Admitting: Nurse Practitioner

## 2022-01-03 DIAGNOSIS — J0110 Acute frontal sinusitis, unspecified: Secondary | ICD-10-CM | POA: Diagnosis not present

## 2022-01-03 MED ORDER — AMOXICILLIN-POT CLAVULANATE 875-125 MG PO TABS: 1.0000 | ORAL_TABLET | Freq: Two times a day (BID) | ORAL | 0 refills | Status: DC

## 2022-01-03 NOTE — Patient Instructions (Signed)
1. Take meds as prescribed 2. Use a cool mist humidifier especially during the winter months and when heat has been humid. 3. Use saline nose sprays frequently 4. Saline irrigations of the nose can be very helpful if done frequently.  * 4X daily for 1 week*  * Use of a nettie pot can be helpful with this. Follow directions with this* 5. Drink plenty of fluids 6. Keep thermostat turn down low 7.For any cough or congestion- dayquil and nyquil 8. For fever or aces or pains- take tylenol or ibuprofen appropriate for age and weight.  * for fevers greater than 101 orally you may alternate ibuprofen and tylenol every  3 hours.    

## 2022-01-03 NOTE — Progress Notes (Signed)
Virtual Visit Consent   Sabrina Brennan, you are scheduled for a virtual visit with Sabrina Daphine Deutscher, FNP, a [facility] provider, today.     Just as with appointments in the office, your consent must be obtained to participate.  Your consent will be active for this visit and any virtual visit you may have with one of our providers in the next 365 days.     If you have a MyChart account, a copy of this consent can be sent to you electronically.  All virtual visits are billed to your insurance company just like a traditional visit in the office.    As this is a virtual visit, video technology does not allow for your provider to perform a traditional examination.  This may limit your provider's ability to fully assess your condition.  If your provider identifies any concerns that need to be evaluated in person or the need to arrange testing (such as labs, EKG, etc.), we will make arrangements to do so.     Although advances in technology are sophisticated, we cannot ensure that it will always work on either your end or our end.  If the connection with a video visit is poor, the visit may have to be switched to a telephone visit.  With either a video or telephone visit, we are not always able to ensure that we have a secure connection.     I need to obtain your verbal consent now.   Are you willing to proceed with your visit today? YES   Sabrina Brennan has provided verbal consent on 01/03/2022 for a virtual visit (video or telephone).   Sabrina Daphine Deutscher, FNP   Date: 01/03/2022 10:48 AM   Virtual Visit via Video Note   I, Sabrina Brennan, connected with Sabrina Brennan ([id number], 1991/07/20) on 01/03/22 at 11:00 AM EDT by a video-enabled telemedicine application and verified that I am speaking with the correct person using two identifiers.  Location: Patient: Virtual Visit Location Patient: Home Provider: Virtual Visit Location Provider: Mobile   I discussed the limitations of  evaluation and management by telemedicine and the availability of in person appointments. The patient expressed understanding and agreed to proceed.    History of Present Illness: Sabrina Brennan is a 31 y.o. who identifies as a female who was assigned female at birth, and is being seen today for URI.  HPI: URI  This is a new problem. Episode onset: 3-4 days. The problem has been gradually worsening. There has been no fever. Associated symptoms include congestion, coughing, headaches, rhinorrhea, sinus pain and a sore throat. Pertinent negatives include no swollen glands. She has tried acetaminophen for the symptoms. The treatment provided mild relief.   Review of Systems  Constitutional:  Negative for chills and fever.  HENT:  Positive for congestion, rhinorrhea, sinus pain and sore throat.   Respiratory:  Positive for cough.   Neurological:  Positive for headaches.   Problems:  Patient Active Problem List   Diagnosis Date Noted   Conjunctivitis of left eye 12/01/2021   Vaginal discharge 11/23/2021   Uses birth control 10/29/2021   Pityriasis versicolor 11/26/2020   Hemiplegic migraine without status migrainosus, not intractable 10/29/2020   History of abnormal cervical Pap smear 08/28/2020   High grade squamous intraepithelial lesion (HGSIL), grade 3 CIN, 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    Allergies: No Known Allergies Medications:  Current Outpatient Medications:    amoxicillin-clavulanate (AUGMENTIN) 875-125 MG tablet, Take 1 tablet by  mouth 2 (two) times daily., Disp: 14 tablet, Rfl: 0   albuterol (VENTOLIN HFA) 108 (90 Base) MCG/ACT inhaler, Inhale 2 puffs into the lungs every 6 (six) hours as needed for wheezing or shortness of breath. (Patient not taking: Reported on 10/29/2021), Disp: 8 g, Rfl: 0   benzonatate (TESSALON) 100 MG capsule, Take 1 capsule (100 mg total) by mouth every 8 (eight) hours. (Patient not taking: Reported on 10/29/2021), Disp: 21 capsule, Rfl: 0   cetirizine  (ZYRTEC) 10 MG tablet, Take 1 tablet (10 mg total) by mouth daily., Disp: 30 tablet, Rfl: 11   erythromycin ophthalmic ointment, Place 1 application. into the left eye 2 (two) times daily. For 5 days, Disp: 3.5 g, Rfl: 0   fluconazole (DIFLUCAN) 150 MG tablet, Take 1 tablet by mouth on Day 1, Day 4 and Day 7., Disp: 3 tablet, Rfl: 0   Galcanezumab-gnlm (EMGALITY) 120 MG/ML SOAJ, Initiate therapy Loading dose of 240mg  then after 30 days 120mg   every 30 days. (Patient not taking: Reported on 10/29/2021), Disp: 2.26 mL, Rfl: 5   HYDROcodone-acetaminophen (NORCO/VICODIN) 5-325 MG tablet, Take 1 tablet by mouth every 4 (four) hours as needed for moderate pain. (Patient not taking: Reported on 10/29/2021), Disp: 20 tablet, Rfl: 0   ketoconazole (NIZORAL) 2 % cream, Apply 1 application. topically daily. Apply to the small lesions on the back, Disp: 15 g, Rfl: 0   lidocaine (LIDODERM) 5 %, Place 1 patch onto the skin daily. Remove & Discard patch within 12 hours or as directed by MD (Patient not taking: Reported on 10/29/2021), Disp: 10 patch, Rfl: 0   lidocaine (XYLOCAINE) 2 % solution, Use as directed 15 mLs in the mouth or throat as needed for mouth pain. (Patient not taking: Reported on 10/29/2021), Disp: 150 mL, Rfl: 0   medroxyPROGESTERone (DEPO-PROVERA) 150 MG/ML injection, Inject 150 mg into the muscle every 3 (three) months., Disp: , Rfl:    montelukast (SINGULAIR) 10 MG tablet, Take 1 tablet (10 mg total) by mouth at bedtime. (Patient not taking: Reported on 10/29/2021), Disp: 14 tablet, Rfl: 0   phenol (CHLORASEPTIC) 1.4 % LIQD, Use as directed 1 spray in the mouth or throat as needed for throat irritation / pain. (Patient not taking: Reported on 10/29/2021), Disp: 177 mL, Rfl: 0   promethazine-dextromethorphan (PROMETHAZINE-DM) 6.25-15 MG/5ML syrup, Take 2.5 mLs by mouth 3 (three) times daily as needed for cough. (Patient not taking: Reported on 10/29/2021), Disp: 118 mL, Rfl: 0   pyrithione zinc (SELSUN  BLUE FULL & THICK) 1 % shampoo, Apply topically daily as needed for itching., Disp: 400 mL, Rfl: 12   Spacer/Aero-Holding Chambers DEVI, 1 each by Does not apply Brennan as needed. (Patient not taking: Reported on 10/29/2021), Disp: 1 each, Rfl: 0   SUMAtriptan (IMITREX) 100 MG tablet, Take 1 tablet (100 mg total) by mouth every 2 (two) hours as needed for migraine. May repeat in 2 hours if headache persists or recurs., Disp: 30 tablet, Rfl: 3  Current Facility-Administered Medications:    medroxyPROGESTERone (DEPO-PROVERA) injection 150 mg, 150 mg, Intramuscular, Once, 10/31/2021, MD  Observations/Objective: Patient is well-developed, well-nourished in no acute distress.  Resting comfortably  at home.  Head is normocephalic, atraumatic.  No labored breathing.  Speech is clear and coherent with logical content.  Patient is alert and oriented at baseline.  Raspy voice no cough Rubbing frontal sinus rea  Assessment and Plan:  10/31/2021 in today with chief complaint of URI   1.  Acute non-recurrent frontal sinusitis 1. Take meds as prescribed 2. Use a cool mist humidifier especially during the winter months and when heat has been humid. 3. Use saline nose sprays frequently 4. Saline irrigations of the nose can be very helpful if done frequently.  * 4X daily for 1 week*  * Use of a nettie pot can be helpful with this. Follow directions with this* 5. Drink plenty of fluids 6. Keep thermostat turn down low 7.For any cough or congestion- dayquil and nighquil 8. For fever or aces or pains- take tylenol or ibuprofen appropriate for age and weight.  * for fevers greater than 101 orally you may alternate ibuprofen and tylenol every  3 hours.   Meds ordered this encounter  Medications   amoxicillin-clavulanate (AUGMENTIN) 875-125 MG tablet    Sig: Take 1 tablet by mouth 2 (two) times daily.    Dispense:  14 tablet    Refill:  0    Order Specific Question:   Supervising Provider     Answer:   Eber HongMILLER, BRIAN [[id number]]        Follow Up Instructions: I discussed the assessment and treatment plan with the patient. The patient was provided an opportunity to ask questions and all were answered. The patient agreed with the plan and demonstrated an understanding of the instructions.  A copy of instructions were sent to the patient via MyChart.  The patient was advised to call back or seek an in-person evaluation if the symptoms worsen or if the condition fails to improve as anticipated.  Time:  I spent 7 minutes with the patient via telehealth technology discussing the above problems/concerns.    Sabrina Daphine DeutscherMartin, FNP

## 2022-01-04 ENCOUNTER — Telehealth: Payer: BC Managed Care – PPO | Admitting: Physician Assistant

## 2022-01-04 DIAGNOSIS — J019 Acute sinusitis, unspecified: Secondary | ICD-10-CM

## 2022-01-04 DIAGNOSIS — B9689 Other specified bacterial agents as the cause of diseases classified elsewhere: Secondary | ICD-10-CM

## 2022-01-04 MED ORDER — PREDNISONE 20 MG PO TABS: 20.0000 mg | ORAL_TABLET | Freq: Every day | ORAL | 0 refills | Status: DC

## 2022-01-04 MED ORDER — IPRATROPIUM BROMIDE 0.03 % NA SOLN
2.0000 | Freq: Two times a day (BID) | NASAL | 0 refills | Status: DC
Start: 2022-01-04 — End: 2022-04-06

## 2022-01-04 NOTE — Progress Notes (Signed)
Virtual Visit Consent   [name], you are scheduled for a virtual visit with a [place] provider today. Just as with appointments in the office, your consent must be obtained to participate. Your consent will be active for this visit and any virtual visit you may have with one of our providers in the next 365 days. If you have a MyChart account, a copy of this consent can be sent to you electronically.  As this is a virtual visit, video technology does not allow for your provider to perform a traditional examination. This may limit your provider's ability to fully assess your condition. If your provider identifies any concerns that need to be evaluated in person or the need to arrange testing (such as labs, EKG, etc.), we will make arrangements to do so. Although advances in technology are sophisticated, we cannot ensure that it will always work on either your end or our end. If the connection with a video visit is poor, the visit may have to be switched to a telephone visit. With either a video or telephone visit, we are not always able to ensure that we have a secure connection.  By engaging in this virtual visit, you consent to the provision of healthcare and authorize for your insurance to be billed (if applicable) for the services provided during this visit. Depending on your insurance coverage, you may receive a charge related to this service.  I need to obtain your verbal consent now. Are you willing to proceed with your visit today? CEIL RODERICK has provided verbal consent on 01/04/2022 for a virtual visit (video or telephone). [name], PA-C  Date: 01/04/2022 4:20 PM  Virtual Visit via Video Note   I, [name], connected with  CORTNEE STEINMILLER  ([id number], 19-Jul-1991) on 01/04/22 at  4:15 PM EDT by a video-enabled telemedicine application and verified that I am speaking with the correct person using two identifiers.  Location: Patient: Virtual Visit Location  Patient: Home Provider: Virtual Visit Location Provider: Home Office   I discussed the limitations of evaluation and management by telemedicine and the availability of in person appointments. The patient expressed understanding and agreed to proceed.    History of Present Illness: Sabrina Brennan is a 31 y.o. who identifies as a female who was assigned female at birth, and is being seen today for sinus infection. She was seen virtually yesterday, 01/03/22. She was started on Augmentin. She has completed 3 doses and is still having continued pressure in her head causing headache and left ear pain. Having ringing in the left ear. Also having increased sore throat and irritation.    Problems:  Patient Active Problem List   Diagnosis Date Noted   Conjunctivitis of left eye 12/01/2021   Vaginal discharge 11/23/2021   Uses birth control 10/29/2021   Pityriasis versicolor 11/26/2020   Hemiplegic migraine without status migrainosus, not intractable 10/29/2020   History of abnormal cervical Pap smear 08/28/2020   High grade squamous intraepithelial lesion (HGSIL), grade 3 CIN, 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    Allergies: No Known Allergies Medications:  Current Outpatient Medications:    ipratropium (ATROVENT) 0.03 % nasal spray, Place 2 sprays into both nostrils every 12 (twelve) hours., Disp: 30 mL, Rfl: 0   predniSONE (DELTASONE) 20 MG tablet, Take 1 tablet (20 mg total) by mouth daily with breakfast., Disp: 5 tablet, Rfl: 0   albuterol (VENTOLIN HFA) 108 (90 Base) MCG/ACT inhaler, Inhale 2 puffs into the  lungs every 6 (six) hours as needed for wheezing or shortness of breath. (Patient not taking: Reported on 10/29/2021), Disp: 8 g, Rfl: 0   amoxicillin-clavulanate (AUGMENTIN) 875-125 MG tablet, Take 1 tablet by mouth 2 (two) times daily., Disp: 14 tablet, Rfl: 0   benzonatate (TESSALON) 100 MG capsule, Take 1 capsule (100 mg total) by mouth every 8 (eight) hours. (Patient not taking: Reported on  10/29/2021), Disp: 21 capsule, Rfl: 0   cetirizine (ZYRTEC) 10 MG tablet, Take 1 tablet (10 mg total) by mouth daily., Disp: 30 tablet, Rfl: 11   erythromycin ophthalmic ointment, Place 1 application. into the left eye 2 (two) times daily. For 5 days, Disp: 3.5 g, Rfl: 0   fluconazole (DIFLUCAN) 150 MG tablet, Take 1 tablet by mouth on Day 1, Day 4 and Day 7., Disp: 3 tablet, Rfl: 0   Galcanezumab-gnlm (EMGALITY) 120 MG/ML SOAJ, Initiate therapy Loading dose of 240mg  then after 30 days 120mg   every 30 days. (Patient not taking: Reported on 10/29/2021), Disp: 2.26 mL, Rfl: 5   HYDROcodone-acetaminophen (NORCO/VICODIN) 5-325 MG tablet, Take 1 tablet by mouth every 4 (four) hours as needed for moderate pain. (Patient not taking: Reported on 10/29/2021), Disp: 20 tablet, Rfl: 0   ketoconazole (NIZORAL) 2 % cream, Apply 1 application. topically daily. Apply to the small lesions on the back, Disp: 15 g, Rfl: 0   lidocaine (LIDODERM) 5 %, Place 1 patch onto the skin daily. Remove & Discard patch within 12 hours or as directed by MD (Patient not taking: Reported on 10/29/2021), Disp: 10 patch, Rfl: 0   medroxyPROGESTERone (DEPO-PROVERA) 150 MG/ML injection, Inject 150 mg into the muscle every 3 (three) months., Disp: , Rfl:    montelukast (SINGULAIR) 10 MG tablet, Take 1 tablet (10 mg total) by mouth at bedtime. (Patient not taking: Reported on 10/29/2021), Disp: 14 tablet, Rfl: 0   phenol (CHLORASEPTIC) 1.4 % LIQD, Use as directed 1 spray in the mouth or throat as needed for throat irritation / pain. (Patient not taking: Reported on 10/29/2021), Disp: 177 mL, Rfl: 0   promethazine-dextromethorphan (PROMETHAZINE-DM) 6.25-15 MG/5ML syrup, Take 2.5 mLs by mouth 3 (three) times daily as needed for cough. (Patient not taking: Reported on 10/29/2021), Disp: 118 mL, Rfl: 0   pyrithione zinc (SELSUN BLUE FULL & THICK) 1 % shampoo, Apply topically daily as needed for itching., Disp: 400 mL, Rfl: 12   Spacer/Aero-Holding  Chambers DEVI, 1 each by Does not apply route as needed. (Patient not taking: Reported on 10/29/2021), Disp: 1 each, Rfl: 0   SUMAtriptan (IMITREX) 100 MG tablet, Take 1 tablet (100 mg total) by mouth every 2 (two) hours as needed for migraine. May repeat in 2 hours if headache persists or recurs., Disp: 30 tablet, Rfl: 3  Current Facility-Administered Medications:    medroxyPROGESTERone (DEPO-PROVERA) injection 150 mg, 150 mg, Intramuscular, Once, Dana AllanWalsh, Tanya, MD  Observations/Objective: Patient is well-developed, well-nourished in no acute distress.  Resting comfortably at home.  Head is normocephalic, atraumatic.  No labored breathing.  Speech is clear and coherent with logical content.  Patient is alert and oriented at baseline.    Assessment and Plan: 1. Acute bacterial sinusitis - ipratropium (ATROVENT) 0.03 % nasal spray; Place 2 sprays into both nostrils every 12 (twelve) hours.  Dispense: 30 mL; Refill: 0 - predniSONE (DELTASONE) 20 MG tablet; Take 1 tablet (20 mg total) by mouth daily with breakfast.  Dispense: 5 tablet; Refill: 0  - Add prednisone and Ipratropium  - Continue  Augmentin - Continue allergy medications - Steam and humidifier can help - Seek in person evaluation if symptoms continue to worsen or fail to improve  Follow Up Instructions: I discussed the assessment and treatment plan with the patient. The patient was provided an opportunity to ask questions and all were answered. The patient agreed with the plan and demonstrated an understanding of the instructions.  A copy of instructions were sent to the patient via MyChart unless otherwise noted below.    The patient was advised to call back or seek an in-person evaluation if the symptoms worsen or if the condition fails to improve as anticipated.  Time:  I spent 11 minutes with the patient via telehealth technology discussing the above problems/concerns.    [name], PA-C

## 2022-01-04 NOTE — Patient Instructions (Signed)
[name], thank you for joining Mar Daring, PA-C for today's virtual visit.  While this provider is not your primary care provider (PCP), if your PCP is located in our provider database this encounter information will be shared with them immediately following your visit.  Consent: (Patient) [name] provided verbal consent for this virtual visit at the beginning of the encounter.  Current Medications:  Current Outpatient Medications:    ipratropium (ATROVENT) 0.03 % nasal spray, Place 2 sprays into both nostrils every 12 (twelve) hours., Disp: 30 mL, Rfl: 0   predniSONE (DELTASONE) 20 MG tablet, Take 1 tablet (20 mg total) by mouth daily with breakfast., Disp: 5 tablet, Rfl: 0   albuterol (VENTOLIN HFA) 108 (90 Base) MCG/ACT inhaler, Inhale 2 puffs into the lungs every 6 (six) hours as needed for wheezing or shortness of breath. (Patient not taking: Reported on 10/29/2021), Disp: 8 g, Rfl: 0   amoxicillin-clavulanate (AUGMENTIN) 875-125 MG tablet, Take 1 tablet by mouth 2 (two) times daily., Disp: 14 tablet, Rfl: 0   benzonatate (TESSALON) 100 MG capsule, Take 1 capsule (100 mg total) by mouth every 8 (eight) hours. (Patient not taking: Reported on 10/29/2021), Disp: 21 capsule, Rfl: 0   cetirizine (ZYRTEC) 10 MG tablet, Take 1 tablet (10 mg total) by mouth daily., Disp: 30 tablet, Rfl: 11   erythromycin ophthalmic ointment, Place 1 application. into the left eye 2 (two) times daily. For 5 days, Disp: 3.5 g, Rfl: 0   fluconazole (DIFLUCAN) 150 MG tablet, Take 1 tablet by mouth on Day 1, Day 4 and Day 7., Disp: 3 tablet, Rfl: 0   Galcanezumab-gnlm (EMGALITY) 120 MG/ML SOAJ, Initiate therapy Loading dose of 240mg  then after 30 days 120mg   every 30 days. (Patient not taking: Reported on 10/29/2021), Disp: 2.26 mL, Rfl: 5   HYDROcodone-acetaminophen (NORCO/VICODIN) 5-325 MG tablet, Take 1 tablet by mouth every 4 (four) hours as needed for moderate pain. (Patient not taking:  Reported on 10/29/2021), Disp: 20 tablet, Rfl: 0   ketoconazole (NIZORAL) 2 % cream, Apply 1 application. topically daily. Apply to the small lesions on the back, Disp: 15 g, Rfl: 0   lidocaine (LIDODERM) 5 %, Place 1 patch onto the skin daily. Remove & Discard patch within 12 hours or as directed by MD (Patient not taking: Reported on 10/29/2021), Disp: 10 patch, Rfl: 0   medroxyPROGESTERone (DEPO-PROVERA) 150 MG/ML injection, Inject 150 mg into the muscle every 3 (three) months., Disp: , Rfl:    montelukast (SINGULAIR) 10 MG tablet, Take 1 tablet (10 mg total) by mouth at bedtime. (Patient not taking: Reported on 10/29/2021), Disp: 14 tablet, Rfl: 0   phenol (CHLORASEPTIC) 1.4 % LIQD, Use as directed 1 spray in the mouth or throat as needed for throat irritation / pain. (Patient not taking: Reported on 10/29/2021), Disp: 177 mL, Rfl: 0   promethazine-dextromethorphan (PROMETHAZINE-DM) 6.25-15 MG/5ML syrup, Take 2.5 mLs by mouth 3 (three) times daily as needed for cough. (Patient not taking: Reported on 10/29/2021), Disp: 118 mL, Rfl: 0   pyrithione zinc (SELSUN BLUE FULL & THICK) 1 % shampoo, Apply topically daily as needed for itching., Disp: 400 mL, Rfl: 12   Spacer/Aero-Holding Chambers DEVI, 1 each by Does not apply route as needed. (Patient not taking: Reported on 10/29/2021), Disp: 1 each, Rfl: 0   SUMAtriptan (IMITREX) 100 MG tablet, Take 1 tablet (100 mg total) by mouth every 2 (two) hours as needed for migraine. May repeat in 2 hours if  headache persists or recurs., Disp: 30 tablet, Rfl: 3  Current Facility-Administered Medications:    medroxyPROGESTERone (DEPO-PROVERA) injection 150 mg, 150 mg, Intramuscular, Once, Carollee Leitz, MD   Medications ordered in this encounter:  Meds ordered this encounter  Medications   ipratropium (ATROVENT) 0.03 % nasal spray    Sig: Place 2 sprays into both nostrils every 12 (twelve) hours.    Dispense:  30 mL    Refill:  0    Order Specific Question:    Supervising Provider    Answer:   MILLER, BRIAN [[id number]]   predniSONE (DELTASONE) 20 MG tablet    Sig: Take 1 tablet (20 mg total) by mouth daily with breakfast.    Dispense:  5 tablet    Refill:  0    Order Specific Question:   Supervising Provider    Answer:   Sabra Heck, BRIAN [[id number]]     *If you need refills on other medications prior to your next appointment, please contact your pharmacy*  Follow-Up: Call back or seek an in-person evaluation if the symptoms worsen or if the condition fails to improve as anticipated.  Other Instructions Sinus Infection, Adult A sinus infection, also called sinusitis, is inflammation of your sinuses. Sinuses are hollow spaces in the bones around your face. Your sinuses are located: Around your eyes. In the middle of your forehead. Behind your nose. In your cheekbones. Mucus normally drains out of your sinuses. When your nasal tissues become inflamed or swollen, mucus can become trapped or blocked. This allows bacteria, viruses, and fungi to grow, which leads to infection. Most infections of the sinuses are caused by a virus. A sinus infection can develop quickly. It can last for up to 4 weeks (acute) or for more than 12 weeks (chronic). A sinus infection often develops after a cold. What are the causes? This condition is caused by anything that creates swelling in the sinuses or stops mucus from draining. This includes: Allergies. Asthma. Infection from bacteria or viruses. Deformities or blockages in your nose or sinuses. Abnormal growths in the nose (nasal polyps). Pollutants, such as chemicals or irritants in the air. Infection from fungi. This is rare. What increases the risk? You are more likely to develop this condition if you: Have a weak body defense system (immune system). Do a lot of swimming or diving. Overuse nasal sprays. Smoke. What are the signs or symptoms? The main symptoms of this condition are pain and a feeling of pressure  around the affected sinuses. Other symptoms include: Stuffy nose or congestion that makes it difficult to breathe through your nose. Thick yellow or greenish drainage from your nose. Tenderness, swelling, and warmth over the affected sinuses. A cough that may get worse at night. Decreased sense of smell and taste. Extra mucus that collects in the throat or the back of the nose (postnasal drip) causing a sore throat or bad breath. Tiredness (fatigue). Fever. How is this diagnosed? This condition is diagnosed based on: Your symptoms. Your medical history. A physical exam. Tests to find out if your condition is acute or chronic. This may include: Checking your nose for nasal polyps. Viewing your sinuses using a device that has a light (endoscope). Testing for allergies or bacteria. Imaging tests, such as an MRI or CT scan. In rare cases, a bone biopsy may be done to rule out more serious types of fungal sinus disease. How is this treated? Treatment for a sinus infection depends on the cause and whether your condition is  chronic or acute. If caused by a virus, your symptoms should go away on their own within 10 days. You may be given medicines to relieve symptoms. They include: Medicines that shrink swollen nasal passages (decongestants). A spray that eases inflammation of the nostrils (topical intranasal corticosteroids). Rinses that help get rid of thick mucus in your nose (nasal saline washes). Medicines that treat allergies (antihistamines). Over-the-counter pain relievers. If caused by bacteria, your health care provider may recommend waiting to see if your symptoms improve. Most bacterial infections will get better without antibiotic medicine. You may be given antibiotics if you have: A severe infection. A weak immune system. If caused by narrow nasal passages or nasal polyps, surgery may be needed. Follow these instructions at home: Medicines Take, use, or apply over-the-counter  and prescription medicines only as told by your health care provider. These may include nasal sprays. If you were prescribed an antibiotic medicine, take it as told by your health care provider. Do not stop taking the antibiotic even if you start to feel better. Hydrate and humidify  Drink enough fluid to keep your urine pale yellow. Staying hydrated will help to thin your mucus. Use a cool mist humidifier to keep the humidity level in your home above 50%. Inhale steam for 10-15 minutes, 3-4 times a day, or as told by your health care provider. You can do this in the bathroom while a hot shower is running. Limit your exposure to cool or dry air. Rest Rest as much as possible. Sleep with your head raised (elevated). Make sure you get enough sleep each night. General instructions  Apply a warm, moist washcloth to your face 3-4 times a day or as told by your health care provider. This will help with discomfort. Use nasal saline washes as often as told by your health care provider. Wash your hands often with soap and water to reduce your exposure to germs. If soap and water are not available, use hand sanitizer. Do not smoke. Avoid being around people who are smoking (secondhand smoke). Keep all follow-up visits. This is important. Contact a health care provider if: You have a fever. Your symptoms get worse. Your symptoms do not improve within 10 days. Get help right away if: You have a severe headache. You have persistent vomiting. You have severe pain or swelling around your face or eyes. You have vision problems. You develop confusion. Your neck is stiff. You have trouble breathing. These symptoms may be an emergency. Get help right away. Call 911. Do not wait to see if the symptoms will go away. Do not drive yourself to the hospital. Summary A sinus infection is soreness and inflammation of your sinuses. Sinuses are hollow spaces in the bones around your face. This condition is  caused by nasal tissues that become inflamed or swollen. The swelling traps or blocks the flow of mucus. This allows bacteria, viruses, and fungi to grow, which leads to infection. If you were prescribed an antibiotic medicine, take it as told by your health care provider. Do not stop taking the antibiotic even if you start to feel better. Keep all follow-up visits. This is important. This information is not intended to replace advice given to you by your health care provider. Make sure you discuss any questions you have with your health care provider. Document Revised: 06/23/2021 Document Reviewed: 06/23/2021 Elsevier Patient Education  [address].    If you have been instructed to have an in-person evaluation today at a local  Urgent Care facility, please use the link below. It will take you to a list of all of our available [facility], including address, phone number and hours of operation. Please do not delay care.  [facility]  If you or a family member do not have a primary care provider, use the link below to schedule a visit and establish care. When you choose a [facility] primary care physician or advanced practice provider, you gain a long-term partner in health. Find a Primary Care Provider  Learn more about [facility]'s in-office and virtual care options: [facility]

## 2022-01-05 ENCOUNTER — Encounter: Payer: Self-pay | Admitting: *Deleted

## 2022-01-21 ENCOUNTER — Ambulatory Visit (INDEPENDENT_AMBULATORY_CARE_PROVIDER_SITE_OTHER): Payer: BC Managed Care – PPO

## 2022-01-21 DIAGNOSIS — Z30019 Encounter for initial prescription of contraceptives, unspecified: Secondary | ICD-10-CM | POA: Diagnosis not present

## 2022-01-21 MED ORDER — MEDROXYPROGESTERONE ACETATE 150 MG/ML IM SUSP
150.0000 mg | Freq: Once | INTRAMUSCULAR | Status: AC
  Administered 2022-01-21: 150 mg via INTRAMUSCULAR

## 2022-01-21 NOTE — Progress Notes (Signed)
Patient here today for Depo Provera injection and is within her dates.    Last contraceptive appt was 10/29/2021.  Depo given in [facility] today. Site unremarkable & patient tolerated injection.    Next injection due 04/08/2022-04/22/2022. Reminder card given.

## 2022-01-28 ENCOUNTER — Ambulatory Visit (INDEPENDENT_AMBULATORY_CARE_PROVIDER_SITE_OTHER): Payer: BC Managed Care – PPO | Admitting: Family Medicine

## 2022-01-28 ENCOUNTER — Ambulatory Visit: Payer: BC Managed Care – PPO | Admitting: Family Medicine

## 2022-01-28 ENCOUNTER — Encounter: Payer: Self-pay | Admitting: Family Medicine

## 2022-01-28 VITALS — BP 108/78 | HR 84 | Wt 163.0 lb

## 2022-01-28 DIAGNOSIS — R1013 Epigastric pain: Secondary | ICD-10-CM

## 2022-01-28 DIAGNOSIS — R197 Diarrhea, unspecified: Secondary | ICD-10-CM | POA: Diagnosis not present

## 2022-01-28 MED ORDER — FAMOTIDINE 20 MG PO TABS: 20.0000 mg | ORAL_TABLET | Freq: Two times a day (BID) | ORAL | 1 refills | Status: DC

## 2022-01-28 MED ORDER — DICYCLOMINE HCL 10 MG PO CAPS: 10.0000 mg | ORAL_CAPSULE | Freq: Three times a day (TID) | ORAL | 1 refills | Status: DC

## 2022-01-28 NOTE — Patient Instructions (Signed)
It was a pleasure to see you today!  For belly spasm pain, try dicyclomine with every meal For the pain in your upper belly, please try famotidine once a day. Take it first thing in the morning about 15-20 minutes before breakfast We will get some labs today.  If they are abnormal or we need to do something about them, I will call you.  If they are normal, I will send you a message on MyChart (if it is active) or a letter in the mail.  If you don't hear from [place] in 2 weeks, please call the office  [phone]. Follow up in 2 weeks Return sooner if you are getting worse or unable to drink fluids by mouth or have a fever (temp above 100.4*F)    Be Well,  Dr. Leary Roca

## 2022-01-29 LAB — H. PYLORI BREATH TEST: H pylori Breath Test: NEGATIVE

## 2022-01-30 LAB — GI PROFILE, STOOL, PCR

## 2022-01-30 LAB — CBC WITH DIFFERENTIAL/PLATELET
Basophils Absolute: 0 10*3/uL (ref 0.0–0.2)
Basos: 1 %
EOS (ABSOLUTE): 0.1 10*3/uL (ref 0.0–0.4)
Eos: 2 %
Hematocrit: 41.9 % (ref 34.0–46.6)
Hemoglobin: 13.9 g/dL (ref 11.1–15.9)
Immature Grans (Abs): 0 10*3/uL (ref 0.0–0.1)
Immature Granulocytes: 0 %
Lymphocytes Absolute: 2.2 10*3/uL (ref 0.7–3.1)
Lymphs: 36 %
MCH: 30.5 pg (ref 26.6–33.0)
MCHC: 33.2 g/dL (ref 31.5–35.7)
MCV: 92 fL (ref 79–97)
Monocytes Absolute: 0.6 10*3/uL (ref 0.1–0.9)
Monocytes: 10 %
Neutrophils Absolute: 3.1 10*3/uL (ref 1.4–7.0)
Neutrophils: 51 %
Platelets: 459 10*3/uL — ABNORMAL HIGH (ref 150–450)
RBC: 4.55 x10E6/uL (ref 3.77–5.28)
RDW: 12.5 % (ref 11.7–15.4)
WBC: 6.1 10*3/uL (ref 3.4–10.8)

## 2022-01-30 LAB — COMPREHENSIVE METABOLIC PANEL
ALT: 15 IU/L (ref 0–32)
AST: 14 IU/L (ref 0–40)
Albumin/Globulin Ratio: 1.4 (ref 1.2–2.2)
Albumin: 4.6 g/dL (ref 3.9–5.0)
Alkaline Phosphatase: 78 IU/L (ref 44–121)
BUN/Creatinine Ratio: 10 (ref 9–23)
BUN: 9 mg/dL (ref 6–20)
Bilirubin Total: 0.2 mg/dL (ref 0.0–1.2)
CO2: 22 mmol/L (ref 20–29)
Calcium: 9.6 mg/dL (ref 8.7–10.2)
Chloride: 106 mmol/L (ref 96–106)
Creatinine, Ser: 0.87 mg/dL (ref 0.57–1.00)
Globulin, Total: 3.3 g/dL (ref 1.5–4.5)
Glucose: 84 mg/dL (ref 70–99)
Potassium: 4.3 mmol/L (ref 3.5–5.2)
Sodium: 142 mmol/L (ref 134–144)
Total Protein: 7.9 g/dL (ref 6.0–8.5)
eGFR: 92 mL/min/{1.73_m2} (ref >59)

## 2022-01-30 LAB — LIPASE: Lipase: 29 U/L (ref 14–72)

## 2022-01-31 DIAGNOSIS — R109 Unspecified abdominal pain: Secondary | ICD-10-CM | POA: Insufficient documentation

## 2022-01-31 LAB — CELIAC DISEASE COMPREHENSIVE PANEL WITH REFLEXES
IgA/Immunoglobulin A, Serum: 356 mg/dL — ABNORMAL HIGH (ref 87–352)
Transglutaminase IgA: <2 U/mL (ref 0–3)

## 2022-01-31 NOTE — Assessment & Plan Note (Signed)
DDx is broad given nonspecific history and findings.  Consider GERD, PUD, IBD, IBS, malabsorptive disease such as celiac, lactose intolerance, infectious given travel outside the country.  Will do trial of famotidine for GERD, recommend keeping a symptom journal to try to identify triggering foods, sleep with elevated head to try to prevent acid reflux symptoms.  Although does not occur always sometimes there seems to be colonic reflux with eating foods which could present an indication for IBS, though patient has symptoms that do not match Rome criteria, so must keep broad ddx for now.  Will trial dicyclomine for abdominal pain and cramping symptoms.  With travel history will obtain GI pathogen panel, CMP, CBC.  Weight loss is mild and could be normal water shifts, however, will obtain TTG to r/o celiac disease.  Will obtain LFTs and CMP as well as blood does not seem to be gallbladder pain symptoms do not match up with history, but this is not out of the realm of possibility given demographic, however lower on the differential with negative Murphy sign and unusual history.  We will also test for H. pylori.  Recommend patient follow-up in 1 to 2 weeks.  If no elucidation with symptom journal and labs and treatment at that point, can consider referral to GI for EGD.  Discussed supportive care and return precautions see AVS.

## 2022-01-31 NOTE — Progress Notes (Signed)
    SUBJECTIVE:   CHIEF COMPLAINT / HPI:   Abdominal pain: 31 year old woman presents with 3 weeks of abdominal pain.  She is noted pain in her epigastric region and occasionally in her left lower quadrant region that is typically worse with eating, though she has not identified any foods that make it worse.  She denies sour taste in her mouth, heartburn pain or chest pain.  Couple months ago she did travel to [place] for vacation, she has not gone camping or drink water [place] outside of from a tap or bottle.  She works as a Engineer, site, notably this started after school was out for the year and her stress level is greatly decreased.  Patient believes she has lost a few pounds, on chart review appears that she has lost about 4 pounds from March through May of this year.  She denies fever, cough, runny nose, shortness of breath, chest pain, rash.  She will occasionally have diarrhea sometimes this is shortly after eating.  She also sometimes gets nausea, no vomiting but nausea is manageable.  PERTINENT  PMH / PSH: Noncontributory  OBJECTIVE:   BP 108/78   Pulse 84   Wt 163 lb (73.9 kg)   SpO2 98%   BMI 32.92 kg/m   Nursing note and vitals reviewed GEN: Age-appropriate, AA W, resting comfortably in chair, NAD, class I obesity HEENT: NCAT. PERRLA. Sclera without injection or icterus. MMM.  Clear oropharynx Neck: Supple.  No LAD Cardiac: Regular rate and rhythm. Normal S1/S2. No murmurs, rubs, or gallops appreciated. 2+ radial pulses. Abdomen: Soft, mildly tender in epigastric and left lower quadrant regions, patient with voluntary guarding, not distended, no fluid wave, no HSM.  Normal active bowel sounds. Negative murphy sign. Neuro: AOx3  Ext: no edema Psych: Pleasant and appropriate   ASSESSMENT/PLAN:   Abdominal pain DDx is broad given nonspecific history and findings.  Consider GERD, PUD, IBD, IBS, malabsorptive disease such as celiac, lactose intolerance, infectious given  travel outside the country.  Will do trial of famotidine for GERD, recommend keeping a symptom journal to try to identify triggering foods, sleep with elevated head to try to prevent acid reflux symptoms.  Although does not occur always sometimes there seems to be colonic reflux with eating foods which could present an indication for IBS, though patient has symptoms that do not match [place] criteria, so must keep broad ddx for now.  Will trial dicyclomine for abdominal pain and cramping symptoms.  With travel history will obtain GI pathogen panel, CMP, CBC.  Weight loss is mild and could be normal water shifts, however, will obtain TTG to r/o celiac disease.  Will obtain LFTs and CMP as well as blood does not seem to be gallbladder pain symptoms do not match up with history, but this is not out of the realm of possibility given demographic, however lower on the differential with negative Murphy sign and unusual history.  We will also test for H. pylori.  Recommend patient follow-up in 1 to 2 weeks.  If no elucidation with symptom journal and labs and treatment at that point, can consider referral to GI for EGD.  Discussed supportive care and return precautions see AVS.     Shirlean Mylar, MD [facility]

## 2022-02-01 ENCOUNTER — Telehealth: Payer: BC Managed Care – PPO | Admitting: Physician Assistant

## 2022-02-01 ENCOUNTER — Other Ambulatory Visit: Payer: Self-pay

## 2022-02-01 DIAGNOSIS — R1084 Generalized abdominal pain: Secondary | ICD-10-CM

## 2022-02-01 DIAGNOSIS — R112 Nausea with vomiting, unspecified: Secondary | ICD-10-CM | POA: Diagnosis not present

## 2022-02-01 DIAGNOSIS — Z5321 Procedure and treatment not carried out due to patient leaving prior to being seen by health care provider: Secondary | ICD-10-CM | POA: Insufficient documentation

## 2022-02-01 DIAGNOSIS — M545 Low back pain, unspecified: Secondary | ICD-10-CM | POA: Insufficient documentation

## 2022-02-01 DIAGNOSIS — R109 Unspecified abdominal pain: Secondary | ICD-10-CM | POA: Diagnosis present

## 2022-02-01 LAB — URINALYSIS, ROUTINE W REFLEX MICROSCOPIC
Bacteria, UA: NONE SEEN
Bilirubin Urine: NEGATIVE
Glucose, UA: NEGATIVE mg/dL
Hgb urine dipstick: NEGATIVE
Ketones, ur: NEGATIVE mg/dL
Nitrite: NEGATIVE
Protein, ur: NEGATIVE mg/dL
Specific Gravity, Urine: 1.005 (ref 1.005–1.030)
pH: 6 (ref 5.0–8.0)

## 2022-02-01 LAB — COMPREHENSIVE METABOLIC PANEL
ALT: 19 U/L (ref 0–44)
AST: 19 U/L (ref 15–41)
Albumin: 4.1 g/dL (ref 3.5–5.0)
Alkaline Phosphatase: 70 U/L (ref 38–126)
Anion gap: 10 (ref 5–15)
BUN: 11 mg/dL (ref 6–20)
CO2: 22 mmol/L (ref 22–32)
Calcium: 9.1 mg/dL (ref 8.9–10.3)
Chloride: 109 mmol/L (ref 98–111)
Creatinine, Ser: 0.86 mg/dL (ref 0.44–1.00)
GFR, Estimated: >60 mL/min (ref >60)
Glucose, Bld: 100 mg/dL — ABNORMAL HIGH (ref 70–99)
Potassium: 3.7 mmol/L (ref 3.5–5.1)
Sodium: 141 mmol/L (ref 135–145)
Total Bilirubin: 0.5 mg/dL (ref 0.3–1.2)
Total Protein: 8 g/dL (ref 6.5–8.1)

## 2022-02-01 LAB — POC URINE PREG, ED: Preg Test, Ur: NEGATIVE

## 2022-02-01 LAB — CBC
HCT: 38.8 % (ref 36.0–46.0)
Hemoglobin: 12.7 g/dL (ref 12.0–15.0)
MCH: 30.8 pg (ref 26.0–34.0)
MCHC: 32.7 g/dL (ref 30.0–36.0)
MCV: 93.9 fL (ref 80.0–100.0)
Platelets: 407 10*3/uL — ABNORMAL HIGH (ref 150–400)
RBC: 4.13 MIL/uL (ref 3.87–5.11)
RDW: 12.6 % (ref 11.5–15.5)
WBC: 7.8 10*3/uL (ref 4.0–10.5)
nRBC: 0 % (ref 0.0–0.2)

## 2022-02-01 LAB — LIPASE, BLOOD: Lipase: 36 U/L (ref 11–51)

## 2022-02-01 NOTE — Progress Notes (Signed)
Virtual Visit Consent   [name], you are scheduled for a virtual visit with a [facility] provider today. Just as with appointments in the office, your consent must be obtained to participate. Your consent will be active for this visit and any virtual visit you may have with one of our providers in the next 365 days. If you have a MyChart account, a copy of this consent can be sent to you electronically.  As this is a virtual visit, video technology does not allow for your provider to perform a traditional examination. This may limit your provider's ability to fully assess your condition. If your provider identifies any concerns that need to be evaluated in person or the need to arrange testing (such as labs, EKG, etc.), we will make arrangements to do so. Although advances in technology are sophisticated, we cannot ensure that it will always work on either your end or our end. If the connection with a video visit is poor, the visit may have to be switched to a telephone visit. With either a video or telephone visit, we are not always able to ensure that we have a secure connection.  By engaging in this virtual visit, you consent to the provision of healthcare and authorize for your insurance to be billed (if applicable) for the services provided during this visit. Depending on your insurance coverage, you may receive a charge related to this service.  I need to obtain your verbal consent now. Are you willing to proceed with your visit today? [name] has provided verbal consent on 02/01/2022 for a virtual visit (video or telephone). [name], PA-C  Date: 02/01/2022 3:21 PM  Virtual Visit via Video Note   I, [name], [name]  ([id number], Dec 30, 1990) on 02/01/22 at  3:15 PM EDT by a video-enabled telemedicine application and verified that I am speaking with the correct person using two identifiers.  Location: Patient: Virtual Visit Location  Patient: Home Provider: Virtual Visit Location Provider: Home Office   I discussed the limitations of evaluation and management by telemedicine and the availability of in person appointments. The patient expressed understanding and agreed to proceed.    History of Present Illness: Sabrina Brennan is a 31 y.o. who identifies as a female who was assigned female at birth, and is being seen today for continued abdominal pain.  Seen in person on 01/28/22 with PCP. Had labs, GI Stool profile, celiac panel, H. Pylori, all within normal limits. She was put on Famotidine 20mg  BID and Bentyl 10mg  ACHS.   Today, she reports she had for lunch, including chicken, salad, potato with butter. About 30 minutes after eating she developed sharp, burning abdominal pain that is radiating into the back. Limiting movements. Has not improved since onset.   Problems:  Patient Active Problem List   Diagnosis Date Noted   Abdominal pain 01/31/2022   Conjunctivitis of left eye 12/01/2021   Vaginal discharge 11/23/2021   Uses birth control 10/29/2021   Pityriasis versicolor 11/26/2020   Hemiplegic migraine without status migrainosus, not intractable 10/29/2020   History of abnormal cervical Pap smear 08/28/2020   High grade squamous intraepithelial lesion (HGSIL), grade 3 CIN, 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    Allergies: No Known Allergies Medications:  Current Outpatient Medications:    albuterol (VENTOLIN HFA) 108 (90 Base) MCG/ACT inhaler, Inhale 2 puffs into the lungs every 6 (six) hours as needed for wheezing or shortness of  breath. (Patient not taking: Reported on 10/29/2021), Disp: 8 g, Rfl: 0   amoxicillin-clavulanate (AUGMENTIN) 875-125 MG tablet, Take 1 tablet by mouth 2 (two) times daily., Disp: 14 tablet, Rfl: 0   benzonatate (TESSALON) 100 MG capsule, Take 1 capsule (100 mg total) by mouth every 8 (eight) hours. (Patient not taking: Reported on 10/29/2021), Disp: 21 capsule, Rfl: 0    cetirizine (ZYRTEC) 10 MG tablet, Take 1 tablet (10 mg total) by mouth daily., Disp: 30 tablet, Rfl: 11   dicyclomine (BENTYL) 10 MG capsule, Take 1 capsule (10 mg total) by mouth 4 (four) times daily -  before meals and at bedtime., Disp: 90 capsule, Rfl: 1   erythromycin ophthalmic ointment, Place 1 application. into the left eye 2 (two) times daily. For 5 days, Disp: 3.5 g, Rfl: 0   famotidine (PEPCID) 20 MG tablet, Take 1 tablet (20 mg total) by mouth 2 (two) times daily., Disp: 30 tablet, Rfl: 1   fluconazole (DIFLUCAN) 150 MG tablet, Take 1 tablet by mouth on Day 1, Day 4 and Day 7., Disp: 3 tablet, Rfl: 0   Galcanezumab-gnlm (EMGALITY) 120 MG/ML SOAJ, Initiate therapy Loading dose of 240mg  then after 30 days 120mg   every 30 days. (Patient not taking: Reported on 10/29/2021), Disp: 2.26 mL, Rfl: 5   HYDROcodone-acetaminophen (NORCO/VICODIN) 5-325 MG tablet, Take 1 tablet by mouth every 4 (four) hours as needed for moderate pain. (Patient not taking: Reported on 10/29/2021), Disp: 20 tablet, Rfl: 0   ipratropium (ATROVENT) 0.03 % nasal spray, Place 2 sprays into both nostrils every 12 (twelve) hours., Disp: 30 mL, Rfl: 0   ketoconazole (NIZORAL) 2 % cream, Apply 1 application. topically daily. Apply to the small lesions on the back, Disp: 15 g, Rfl: 0   lidocaine (LIDODERM) 5 %, Place 1 patch onto the skin daily. Remove & Discard patch within 12 hours or as directed by MD (Patient not taking: Reported on 10/29/2021), Disp: 10 patch, Rfl: 0   medroxyPROGESTERone (DEPO-PROVERA) 150 MG/ML injection, Inject 150 mg into the muscle every 3 (three) months., Disp: , Rfl:    montelukast (SINGULAIR) 10 MG tablet, Take 1 tablet (10 mg total) by mouth at bedtime. (Patient not taking: Reported on 10/29/2021), Disp: 14 tablet, Rfl: 0   phenol (CHLORASEPTIC) 1.4 % LIQD, Use as directed 1 spray in the mouth or throat as needed for throat irritation / pain. (Patient not taking: Reported on 10/29/2021), Disp: 177 mL, Rfl:  0   predniSONE (DELTASONE) 20 MG tablet, Take 1 tablet (20 mg total) by mouth daily with breakfast., Disp: 5 tablet, Rfl: 0   promethazine-dextromethorphan (PROMETHAZINE-DM) 6.25-15 MG/5ML syrup, Take 2.5 mLs by mouth 3 (three) times daily as needed for cough. (Patient not taking: Reported on 10/29/2021), Disp: 118 mL, Rfl: 0   pyrithione zinc (SELSUN BLUE FULL & THICK) 1 % shampoo, Apply topically daily as needed for itching., Disp: 400 mL, Rfl: 12   Spacer/Aero-Holding Chambers DEVI, 1 each by Does not apply route as needed. (Patient not taking: Reported on 10/29/2021), Disp: 1 each, Rfl: 0   SUMAtriptan (IMITREX) 100 MG tablet, Take 1 tablet (100 mg total) by mouth every 2 (two) hours as needed for migraine. May repeat in 2 hours if headache persists or recurs., Disp: 30 tablet, Rfl: 3  Current Facility-Administered Medications:    medroxyPROGESTERone (DEPO-PROVERA) injection 150 mg, 150 mg, Intramuscular, Once, 10/31/2021, MD  Observations/Objective: Patient is well-developed, well-nourished in no acute distress.  Resting comfortably at home.  Head  is normocephalic, atraumatic.  No labored breathing.  Speech is clear and coherent with logical content.  Patient is alert and oriented at baseline.    Assessment and Plan: 1. Generalized postprandial abdominal pain  - Advised patient to seek in person evaluation at a local ER for further evaluation - DDx: PUD, gastritis, cholecystitis/cholelithiasis, mesenteric ischemia - She agrees to proceed to ER  Follow Up Instructions: I discussed the assessment and treatment plan with the patient. The patient was provided an opportunity to ask questions and all were answered. The patient agreed with the plan and demonstrated an understanding of the instructions.  A copy of instructions were sent to the patient via MyChart unless otherwise noted below.    The patient was advised to call back or seek an in-person evaluation if the symptoms worsen or  if the condition fails to improve as anticipated.  Time:  I spent 10 minutes with the patient via telehealth technology discussing the above problems/concerns.    [name], PA-C

## 2022-02-01 NOTE — Patient Instructions (Signed)
Sabrina Brennan, thank you for joining [name], PA-C for today's virtual visit.  While this provider is not your primary care provider (PCP), if your PCP is located in our provider database this encounter information will be shared with them immediately following your visit.  Consent: (Patient) Sabrina Brennan provided verbal consent for this virtual visit at the beginning of the encounter.  Current Medications:  Current Outpatient Medications:    albuterol (VENTOLIN HFA) 108 (90 Base) MCG/ACT inhaler, Inhale 2 puffs into the lungs every 6 (six) hours as needed for wheezing or shortness of breath. (Patient not taking: Reported on 10/29/2021), Disp: 8 g, Rfl: 0   amoxicillin-clavulanate (AUGMENTIN) 875-125 MG tablet, Take 1 tablet by mouth 2 (two) times daily., Disp: 14 tablet, Rfl: 0   benzonatate (TESSALON) 100 MG capsule, Take 1 capsule (100 mg total) by mouth every 8 (eight) hours. (Patient not taking: Reported on 10/29/2021), Disp: 21 capsule, Rfl: 0   cetirizine (ZYRTEC) 10 MG tablet, Take 1 tablet (10 mg total) by mouth daily., Disp: 30 tablet, Rfl: 11   dicyclomine (BENTYL) 10 MG capsule, Take 1 capsule (10 mg total) by mouth 4 (four) times daily -  before meals and at bedtime., Disp: 90 capsule, Rfl: 1   erythromycin ophthalmic ointment, Place 1 application. into the left eye 2 (two) times daily. For 5 days, Disp: 3.5 g, Rfl: 0   famotidine (PEPCID) 20 MG tablet, Take 1 tablet (20 mg total) by mouth 2 (two) times daily., Disp: 30 tablet, Rfl: 1   fluconazole (DIFLUCAN) 150 MG tablet, Take 1 tablet by mouth on Day 1, Day 4 and Day 7., Disp: 3 tablet, Rfl: 0   Galcanezumab-gnlm (EMGALITY) 120 MG/ML SOAJ, Initiate therapy Loading dose of 240mg  then after 30 days 120mg   every 30 days. (Patient not taking: Reported on 10/29/2021), Disp: 2.26 mL, Rfl: 5   HYDROcodone-acetaminophen (NORCO/VICODIN) 5-325 MG tablet, Take 1 tablet by mouth every 4 (four) hours as needed for moderate pain.  (Patient not taking: Reported on 10/29/2021), Disp: 20 tablet, Rfl: 0   ipratropium (ATROVENT) 0.03 % nasal spray, Place 2 sprays into both nostrils every 12 (twelve) hours., Disp: 30 mL, Rfl: 0   ketoconazole (NIZORAL) 2 % cream, Apply 1 application. topically daily. Apply to the small lesions on the back, Disp: 15 g, Rfl: 0   lidocaine (LIDODERM) 5 %, Place 1 patch onto the skin daily. Remove & Discard patch within 12 hours or as directed by MD (Patient not taking: Reported on 10/29/2021), Disp: 10 patch, Rfl: 0   medroxyPROGESTERone (DEPO-PROVERA) 150 MG/ML injection, Inject 150 mg into the muscle every 3 (three) months., Disp: , Rfl:    montelukast (SINGULAIR) 10 MG tablet, Take 1 tablet (10 mg total) by mouth at bedtime. (Patient not taking: Reported on 10/29/2021), Disp: 14 tablet, Rfl: 0   phenol (CHLORASEPTIC) 1.4 % LIQD, Use as directed 1 spray in the mouth or throat as needed for throat irritation / pain. (Patient not taking: Reported on 10/29/2021), Disp: 177 mL, Rfl: 0   predniSONE (DELTASONE) 20 MG tablet, Take 1 tablet (20 mg total) by mouth daily with breakfast., Disp: 5 tablet, Rfl: 0   promethazine-dextromethorphan (PROMETHAZINE-DM) 6.25-15 MG/5ML syrup, Take 2.5 mLs by mouth 3 (three) times daily as needed for cough. (Patient not taking: Reported on 10/29/2021), Disp: 118 mL, Rfl: 0   pyrithione zinc (SELSUN BLUE FULL & THICK) 1 % shampoo, Apply topically daily as needed for itching., Disp: 400 mL, Rfl:  12   Spacer/Aero-Holding [facility], 1 each by Does not apply Brennan as needed. (Patient not taking: Reported on 10/29/2021), Disp: 1 each, Rfl: 0   SUMAtriptan (IMITREX) 100 MG tablet, Take 1 tablet (100 mg total) by mouth every 2 (two) hours as needed for migraine. May repeat in 2 hours if headache persists or recurs., Disp: 30 tablet, Rfl: 3  Current Facility-Administered Medications:    medroxyPROGESTERone (DEPO-PROVERA) injection 150 mg, 150 mg, Intramuscular, Once, Dana Allan, MD    Medications ordered in this encounter:  No orders of the defined types were placed in this encounter.    *If you need refills on other medications prior to your next appointment, please contact your pharmacy*  Follow-Up: Call back or seek an in-person evaluation if the symptoms worsen or if the condition fails to improve as anticipated.  Other Instructions Based on what you shared with me, I feel your condition warrants further evaluation as soon as possible at an Emergency department.    If you are having a true medical emergency please call 911.      Emergency Department-[facility]  Get Driving Directions  [phone]  [address]  [place], [place] [address]  Open 24/7/365      [facility] Emergency Department at [facility]  Get Driving Directions  [address]  [place], [place] [address]  Open 24/7/365    Emergency Department- [facility]  Get Driving Directions  [phone]  [address]  [place], [place] [address]  Open 24/7/365      [facility] Emergency Department at [facility]  Get Driving Directions  [phone]  [address]  [place], [place] [address]  Open 24/7/365    [facility]  Emergency Department- [facility]  Get Driving Directions  [phone]  [address]  [place], [place] [address]  Open 24/7/365    [place]  Emergency Department- [facility]  Get Driving Directions  [address]  [place], [place] [address]  Open 24/7/365    [facility]  Emergency Department- [facility]  Get Driving Directions  [phone]  [address]  [place], [place] [address]  Open 24/7/365      If you have been instructed to have an in-person evaluation today at a local Urgent Care facility, please use the link below. It will take you to a list of all of our available [facility],  including address, phone number and hours of operation. Please do not delay care.  [facility]  If you or a family member do not have a primary care provider, use the link below to schedule a visit and establish care. When you choose a [facility] primary care physician or advanced practice provider, you gain a long-term partner in health. Find a Primary Care Provider  Learn more about [facility]'s in-office and virtual care options: [facility] - Get Care Now

## 2022-02-01 NOTE — ED Triage Notes (Signed)
Pt in with co mid abd pain and lower back pain for 4 weeks. Was dx by pcp with IBS and stomach ulcer. States pain worse after she eats. Pt has had n.v.d. for 4 weeks.

## 2022-02-02 ENCOUNTER — Encounter (HOSPITAL_COMMUNITY): Payer: Self-pay

## 2022-02-02 ENCOUNTER — Emergency Department
Admission: EM | Admit: 2022-02-02 | Discharge: 2022-02-02 | Discharge status: Against Medical Advice | Payer: BC Managed Care – PPO | Attending: Emergency Medicine | Admitting: Emergency Medicine

## 2022-02-02 ENCOUNTER — Emergency Department (HOSPITAL_COMMUNITY)
Admission: EM | Admit: 2022-02-02 | Discharge: 2022-02-02 | Disposition: A | Discharge status: Home / Self Care | Payer: BC Managed Care – PPO | Attending: Emergency Medicine | Admitting: Emergency Medicine

## 2022-02-02 ENCOUNTER — Other Ambulatory Visit: Payer: Self-pay

## 2022-02-02 ENCOUNTER — Emergency Department (HOSPITAL_COMMUNITY): Payer: BC Managed Care – PPO

## 2022-02-02 DIAGNOSIS — R110 Nausea: Secondary | ICD-10-CM | POA: Diagnosis not present

## 2022-02-02 DIAGNOSIS — R1031 Right lower quadrant pain: Secondary | ICD-10-CM | POA: Diagnosis present

## 2022-02-02 DIAGNOSIS — R197 Diarrhea, unspecified: Secondary | ICD-10-CM | POA: Diagnosis not present

## 2022-02-02 DIAGNOSIS — R350 Frequency of micturition: Secondary | ICD-10-CM | POA: Insufficient documentation

## 2022-02-02 DIAGNOSIS — R1013 Epigastric pain: Secondary | ICD-10-CM | POA: Insufficient documentation

## 2022-02-02 LAB — URINALYSIS, ROUTINE W REFLEX MICROSCOPIC
Bacteria, UA: NONE SEEN
Bilirubin Urine: NEGATIVE
Glucose, UA: NEGATIVE mg/dL
Hgb urine dipstick: NEGATIVE
Ketones, ur: NEGATIVE mg/dL
Nitrite: NEGATIVE
Protein, ur: NEGATIVE mg/dL
Specific Gravity, Urine: 1.018 (ref 1.005–1.030)
pH: 5 (ref 5.0–8.0)

## 2022-02-02 LAB — CBC WITH DIFFERENTIAL/PLATELET
Abs Immature Granulocytes: 0.02 10*3/uL (ref 0.00–0.07)
Basophils Absolute: 0 10*3/uL (ref 0.0–0.1)
Basophils Relative: 1 %
Eosinophils Absolute: 0.2 10*3/uL (ref 0.0–0.5)
Eosinophils Relative: 3 %
HCT: 37.8 % (ref 36.0–46.0)
Hemoglobin: 12.3 g/dL (ref 12.0–15.0)
Immature Granulocytes: 0 %
Lymphocytes Relative: 38 %
Lymphs Abs: 2.5 10*3/uL (ref 0.7–4.0)
MCH: 30.9 pg (ref 26.0–34.0)
MCHC: 32.5 g/dL (ref 30.0–36.0)
MCV: 95 fL (ref 80.0–100.0)
Monocytes Absolute: 0.5 10*3/uL (ref 0.1–1.0)
Monocytes Relative: 7 %
Neutro Abs: 3.3 10*3/uL (ref 1.7–7.7)
Neutrophils Relative %: 51 %
Platelets: 390 10*3/uL (ref 150–400)
RBC: 3.98 MIL/uL (ref 3.87–5.11)
RDW: 12.6 % (ref 11.5–15.5)
WBC: 6.6 10*3/uL (ref 4.0–10.5)
nRBC: 0 % (ref 0.0–0.2)

## 2022-02-02 LAB — COMPREHENSIVE METABOLIC PANEL
ALT: 16 U/L (ref 0–44)
AST: 15 U/L (ref 15–41)
Albumin: 3.6 g/dL (ref 3.5–5.0)
Alkaline Phosphatase: 54 U/L (ref 38–126)
Anion gap: 8 (ref 5–15)
BUN: 7 mg/dL (ref 6–20)
CO2: 21 mmol/L — ABNORMAL LOW (ref 22–32)
Calcium: 8.7 mg/dL — ABNORMAL LOW (ref 8.9–10.3)
Chloride: 111 mmol/L (ref 98–111)
Creatinine, Ser: 0.8 mg/dL (ref 0.44–1.00)
GFR, Estimated: >60 mL/min (ref >60)
Glucose, Bld: 101 mg/dL — ABNORMAL HIGH (ref 70–99)
Potassium: 3.8 mmol/L (ref 3.5–5.1)
Sodium: 140 mmol/L (ref 135–145)
Total Bilirubin: 0.7 mg/dL (ref 0.3–1.2)
Total Protein: 6.8 g/dL (ref 6.5–8.1)

## 2022-02-02 LAB — LIPASE, BLOOD: Lipase: 30 U/L (ref 11–51)

## 2022-02-02 LAB — I-STAT BETA HCG BLOOD, ED (MC, WL, AP ONLY): I-stat hCG, quantitative: <5 m[IU]/mL (ref <5)

## 2022-02-02 MED ORDER — IOHEXOL 300 MG/ML  SOLN
100.0000 mL | Freq: Once | INTRAMUSCULAR | Status: AC | PRN
  Administered 2022-02-02: 100 mL via INTRAVENOUS

## 2022-02-02 MED ORDER — ONDANSETRON HCL 4 MG/2ML IJ SOLN
4.0000 mg | Freq: Once | INTRAMUSCULAR | Status: AC
Start: 2022-02-02 — End: 2022-02-02
  Administered 2022-02-02: 4 mg via INTRAVENOUS
  Filled 2022-02-02: qty 2

## 2022-02-02 MED ORDER — ONDANSETRON 4 MG PO TBDP: 4.0000 mg | ORAL_TABLET | Freq: Three times a day (TID) | ORAL | 0 refills | Status: DC | PRN

## 2022-02-02 MED ORDER — SODIUM CHLORIDE 0.9 % IV BOLUS
1000.0000 mL | Freq: Once | INTRAVENOUS | Status: AC
  Administered 2022-02-02: 1000 mL via INTRAVENOUS

## 2022-02-02 MED ORDER — SUCRALFATE 1 G PO TABS: 1.0000 g | ORAL_TABLET | Freq: Three times a day (TID) | ORAL | 0 refills | Status: DC

## 2022-02-02 NOTE — ED Provider Notes (Cosign Needed Addendum)
[facility] EMERGENCY DEPARTMENT Provider Note   CSN: [id number] Arrival date & time: 02/02/22  1059     History  Chief Complaint  Patient presents with   Abdominal Pain    Sabrina Brennan is a 31 y.o. female with medical history of migraines.  Presents emergency department complaint of abdominal pain.  Patient reports that she has been dealing with abdominal pain after p.o. intake for the last 4 months.  Patient states that location of pain varies.  Sometimes pain is located to epigastric area and left upper quadrant.  Patient dates that yesterday after eating a meal she had a sudden onset of right lower quadrant abdominal pain.  This pain has continued to bother her since then.  Patient reports that she has felt nauseous since her episode of pain yesterday.  Patient also reports some urinary frequency however states that she is drinking copious amounts of fluids due to concerns for dehydration.  Patient also reports that over the last 4 months that she has been dealing with frequent bowel movements with p.o. intake.  Patient states that she did see her PCP who prescribed Bentyl and famotidine.  Patient has been taking these medications as prescribed.  Patient denies any fever, chills, vomiting, constipation, blood in stool, melena, dysuria, hematuria, urinary urgency, vaginal pain, vaginal bleeding, vaginal discharge, lightheadedness, syncope.  Patient reports drinking 8-10 alcoholic beverages on Sunday.  Patient endorses alcohol use with approximately 10 drinks per week.  Patient denies any illicit drug use.  Denies any history of abdominal surgery.  LMP 1 week prior   Abdominal Pain Associated symptoms: diarrhea and nausea   Associated symptoms: no chest pain, no chills, no constipation, no dysuria, no fever, no hematuria, no shortness of breath, no vaginal bleeding, no vaginal discharge and no vomiting        Home Medications Prior to Admission medications    Medication Sig Start Date End Date Taking? Authorizing Provider  albuterol (VENTOLIN HFA) 108 (90 Base) MCG/ACT inhaler Inhale 2 puffs into the lungs every 6 (six) hours as needed for wheezing or shortness of breath. Patient not taking: Reported on 10/29/2021 07/06/21   Cathlyn Parsons, NP  amoxicillin-clavulanate (AUGMENTIN) 875-125 MG tablet Take 1 tablet by mouth 2 (two) times daily. 01/03/22   Daphine Deutscher, Mary-Margaret, FNP  benzonatate (TESSALON) 100 MG capsule Take 1 capsule (100 mg total) by mouth every 8 (eight) hours. Patient not taking: Reported on 10/29/2021 06/27/21   Garlon Hatchet, PA-C  cetirizine (ZYRTEC) 10 MG tablet Take 1 tablet (10 mg total) by mouth daily. 12/02/21   Lilland, Alana, DO  dicyclomine (BENTYL) 10 MG capsule Take 1 capsule (10 mg total) by mouth 4 (four) times daily -  before meals and at bedtime. 01/28/22   Shirlean Mylar, MD  erythromycin ophthalmic ointment Place 1 application. into the left eye 2 (two) times daily. For 5 days 12/01/21   Celedonio Savage, MD  famotidine (PEPCID) 20 MG tablet Take 1 tablet (20 mg total) by mouth 2 (two) times daily. 01/28/22   Shirlean Mylar, MD  fluconazole (DIFLUCAN) 150 MG tablet Take 1 tablet by mouth on Day 1, Day 4 and Day 7. 12/03/21   Waldon Merl, PA-C  Galcanezumab-gnlm [facility]) 120 MG/ML SOAJ Initiate therapy Loading dose of 240mg  then after 30 days 120mg   every 30 days. Patient not taking: Reported on 10/29/2021 06/22/21   10/31/2021, MD  HYDROcodone-acetaminophen (NORCO/VICODIN) 5-325 MG tablet Take 1 tablet by mouth  every 4 (four) hours as needed for moderate pain. Patient not taking: Reported on 10/29/2021 09/09/21   Persons, [place], PA  ipratropium (ATROVENT) 0.03 % nasal spray Place 2 sprays into both nostrils every 12 (twelve) hours. 01/04/22   [name], PA-C  ketoconazole (NIZORAL) 2 % cream Apply 1 application. topically daily. Apply to the small lesions on the back 10/29/21   Alfredo Martinez, MD   lidocaine (LIDODERM) 5 % Place 1 patch onto the skin daily. Remove & Discard patch within 12 hours or as directed by MD Patient not taking: Reported on 10/29/2021 09/06/21   Al Decant, PA-C  medroxyPROGESTERone (DEPO-PROVERA) 150 MG/ML injection Inject 150 mg into the muscle every 3 (three) months.    [provider]  montelukast (SINGULAIR) 10 MG tablet Take 1 tablet (10 mg total) by mouth at bedtime. Patient not taking: Reported on 10/29/2021 08/30/21   Caccavale, Sophia, PA-C  phenol (CHLORASEPTIC) 1.4 % LIQD Use as directed 1 spray in the mouth or throat as needed for throat irritation / pain. Patient not taking: Reported on 10/29/2021 08/30/21   Caccavale, Sophia, PA-C  predniSONE (DELTASONE) 20 MG tablet Take 1 tablet (20 mg total) by mouth daily with breakfast. 01/04/22   [name], PA-C  promethazine-dextromethorphan (PROMETHAZINE-DM) 6.25-15 MG/5ML syrup Take 2.5 mLs by mouth 3 (three) times daily as needed for cough. Patient not taking: Reported on 10/29/2021 06/29/21   Freddy Finner, NP  pyrithione zinc [facility] BLUE FULL & THICK) 1 % shampoo Apply topically daily as needed for itching. 10/29/21   Alfredo Martinez, MD  Spacer/Aero-Holding Deretha Emory DEVI 1 each by Does not apply route as needed. Patient not taking: Reported on 10/29/2021 07/06/21   Cathlyn Parsons, NP  SUMAtriptan (IMITREX) 100 MG tablet Take 1 tablet (100 mg total) by mouth every 2 (two) hours as needed for migraine. May repeat in 2 hours if headache persists or recurs. 03/09/21 06/01/21  Towanda Octave, MD      Allergies    Patient has no known allergies.    Review of Systems   Review of Systems  Constitutional:  Negative for chills and fever.  Respiratory:  Negative for shortness of breath.   Cardiovascular:  Negative for chest pain.  Gastrointestinal:  Positive for abdominal pain, diarrhea and nausea. Negative for abdominal distention, anal bleeding, blood in stool, constipation, rectal pain and  vomiting.  Genitourinary:  Positive for frequency. Negative for difficulty urinating, dysuria, flank pain, hematuria, pelvic pain, urgency, vaginal bleeding, vaginal discharge and vaginal pain.  Musculoskeletal:  Negative for back pain and neck pain.  Skin:  Negative for color change and rash.  Neurological:  Negative for dizziness, syncope, light-headedness and headaches.  Psychiatric/Behavioral:  Negative for confusion.     Physical Exam Updated Vital Signs BP 114/80 (BP Location: Right Arm)   Pulse 86   Temp 98.8 F (37.1 C) (Oral)   Resp 16   Ht 4\' 11"  (1.499 m)   Wt 74.8 kg   SpO2 98%   BMI 33.31 kg/m  Physical Exam Vitals and nursing note reviewed.  Constitutional:      General: She is not in acute distress.    Appearance: She is not ill-appearing, toxic-appearing or diaphoretic.  HENT:     Head: Normocephalic.  Eyes:     General: No scleral icterus.       Right eye: No discharge.        Left eye: No discharge.  Cardiovascular:  Rate and Rhythm: Normal rate.  Pulmonary:     Effort: Pulmonary effort is normal.  Abdominal:     General: Abdomen is flat. Bowel sounds are normal. There is no distension. There are no signs of injury.     Palpations: Abdomen is soft. There is no mass or pulsatile mass.     Tenderness: There is abdominal tenderness in the right upper quadrant, right lower quadrant and epigastric area. There is no right CVA tenderness, left CVA tenderness, guarding or rebound. Positive signs include Murphy's sign.     Hernia: There is no hernia in the umbilical area or ventral area.  Skin:    General: Skin is warm and dry.  Neurological:     General: No focal deficit present.     Mental Status: She is alert.  Psychiatric:        Behavior: Behavior is cooperative.     ED Results / Procedures / Treatments   Labs (all labs ordered are listed, but only abnormal results are displayed) Labs Reviewed  COMPREHENSIVE METABOLIC PANEL - Abnormal; Notable  for the following components:      Result Value   CO2 21 (*)    Glucose, Bld 101 (*)    Calcium 8.7 (*)    All other components within normal limits  URINALYSIS, ROUTINE W REFLEX MICROSCOPIC - Abnormal; Notable for the following components:   APPearance HAZY (*)    Leukocytes,Ua TRACE (*)    All other components within normal limits  LIPASE, BLOOD  CBC WITH DIFFERENTIAL/PLATELET  I-STAT BETA HCG BLOOD, ED ([facility], [facility], [facility] ONLY)    EKG None  Radiology CT ABDOMEN PELVIS W CONTRAST  Result Date: 02/02/2022 CLINICAL DATA:  Right lower quadrant pain EXAM: CT ABDOMEN AND PELVIS WITH CONTRAST TECHNIQUE: Multidetector CT imaging of the abdomen and pelvis was performed using the standard protocol following bolus administration of intravenous contrast. RADIATION DOSE REDUCTION: This exam was performed according to the departmental dose-optimization program which includes automated exposure control, adjustment of the mA and/or kV according to patient size and/or use of iterative reconstruction technique. CONTRAST:  OMNIPAQUE IOHEXOL 300 MG/ML  SOLN COMPARISON:  05/15/2020 FINDINGS: Lower chest: No acute abnormality. Hepatobiliary: No focal liver abnormality is seen. No gallstones, gallbladder wall thickening, or biliary dilatation. Pancreas: Unremarkable. No pancreatic ductal dilatation or surrounding inflammatory changes. Spleen: Normal in size without focal abnormality. Adrenals/Urinary Tract: Unremarkable adrenal glands. Kidneys enhance symmetrically without focal lesion, stone, or hydronephrosis. Ureters are nondilated. Urinary bladder appears unremarkable. Stomach/Bowel: Stomach is within normal limits. Appendix appears normal (series 4, image 61). No evidence of bowel wall thickening, distention, or inflammatory changes. Vascular/Lymphatic: No significant vascular findings are present. No enlarged abdominal or pelvic lymph nodes. Reproductive: Uterus and bilateral adnexa are unremarkable. Other: No  free fluid. No abdominopelvic fluid collection. No pneumoperitoneum. No abdominal wall hernia. Musculoskeletal: No acute or significant osseous findings. Appearance of the subcutaneous fat of the lower abdominal wall compatible with reported history of liposuction surgery. IMPRESSION: No acute abdominopelvic findings. Normal appendix. Electronically Signed   By: Duanne Guess D.O.   On: 02/02/2022 15:26    Procedures Procedures    Medications Ordered in ED Medications  ondansetron (ZOFRAN) injection 4 mg (4 mg Intravenous Given 02/02/22 1401)  sodium chloride 0.9 % bolus 1,000 mL (1,000 mLs Intravenous New Bag/Given 02/02/22 1400)  iohexol (OMNIPAQUE) 300 MG/ML solution 100 mL (100 mLs Intravenous Contrast Given 02/02/22 1436)    ED Course/ Medical Decision Making/ A&P Clinical Course  as of 02/02/22 1523  Tue Feb 02, 2022  1514 Abdominal pain post prandial. Sometimes RUQ, sometimes epigastric. RLQ began yesterday. Had tried famotidine, bentyl. Some TTP throughout. Pending CTAP. Got fluids, pepcid. D/C if okay on CTAP to follow up with Laurette Schimke / PCP. PO challenge prior to DC. [CP]    Clinical Course User Index [CP] Olene Floss, PA-C                           Medical Decision Making Amount and/or Complexity of Data Reviewed Labs: ordered. Radiology: ordered.  Risk Prescription drug management.   Alert 31 year old female in no acute distress, nontoxic-appearing.  Presents emerged department complaint of abdominal pain.  Information obtained from patient.  I reviewed patient's past medical records including previous bladder notes, labs, and imaging.  Patient has medical history as outlined in HPI which complicates her care.  Per chart review patient has been seeing PCP plus over the last week due to reports of abdominal pain and diarrhea.  Patient was started on Bentyl and famotidine.  With reports of postprandial epigastric abdominal pain as well as right lower quadrant  abdominal pain differential diagnosis includes but is not limited to acute appendicitis, cholecystitis, cholelithiasis, biliary colic, acute pancreatitis.  Abdominal labs ordered.  CT abdomen pelvis with contrast ordered.  Patient given Zofran and 1 L fluid bolus.  I personally viewed interpret patient's lab results.  Pertinent findings include: -I-STAT beta-hCG within normal limits -UA shows no signs of infection -CBC, CMP, lipase unremarkable  I personally viewed and interpreted patient CT imaging.  Agree with radiology interpretation of no acute intra-abdominal/pelvic abnormality.  Patient symptoms may be secondary to gastritis versus peptic ulcer disease.  Will prescribe patient with Zofran, Carafate and have her follow-up with gastroenterologist in outpatient setting.  Based on patient's chief complaint, I considered admission might be necessary, however after reassuring ED workup feel patient is reasonable for discharge.  Discussed results, findings, treatment and follow up. Patient advised of return precautions. Patient verbalized understanding and agreed with plan.  Portions of this note were generated with Scientist, clinical (histocompatibility and immunogenetics). Dictation errors may occur despite best attempts at proofreading.         Final Clinical Impression(s) / ED Diagnoses Final diagnoses:  Epigastric abdominal pain  RLQ abdominal pain    Rx / DC Orders ED Discharge Orders          Ordered    sucralfate (CARAFATE) 1 g tablet  3 times daily with meals & bedtime        02/02/22 1528              Haskel Schroeder, PA-C 02/02/22 1527    Haskel Schroeder, PA-C 02/02/22 1534    Benjiman Core, MD 02/03/22 [phone]

## 2022-02-02 NOTE — ED Triage Notes (Signed)
Pt c/o mid abdomen to LUQ pain in abdomenx4 wks. Pt states it's getting worse. Pt states she was dx with a stomach ulcer. Pt states soon as she got done eating a hotdog w/ketchup she had to have a BM right after. Pt states an hour later she started having a bad pain in her bladder.

## 2022-02-02 NOTE — Discharge Instructions (Addendum)
You came to the emergency department today to be evaluated for your abdominal pain.  Your CT abdomen pelvis and lab results were reassuring.  Please continue to take your Bentyl and famotidine as prescribed.  Additionally have given you prescription for Carafate.  A referral to [facility] gastroenterology was placed.  If you do not hear from them in 2-3 business days please use the information on his paperwork to call and schedule follow-up appointment.  Get help right away if: Your pain does not go away as soon as your health care provider told you to expect. You cannot stop vomiting. Your pain is only in areas of the abdomen, such as the right side or the left lower portion of the abdomen. Pain on the right side could be caused by appendicitis. You have bloody or black stools, or stools that look like tar. You have severe pain, cramping, or bloating in your abdomen. You have signs of dehydration, such as: Dark urine, very little urine, or no urine. Cracked lips. Dry mouth. Sunken eyes. Sleepiness. Weakness. You have trouble breathing or chest pain.

## 2022-04-06 ENCOUNTER — Telehealth: Payer: BC Managed Care – PPO | Admitting: Physician Assistant

## 2022-04-06 DIAGNOSIS — L578 Other skin changes due to chronic exposure to nonionizing radiation: Secondary | ICD-10-CM

## 2022-04-06 MED ORDER — HYDROXYZINE PAMOATE 25 MG PO CAPS: 25.0000 mg | ORAL_CAPSULE | Freq: Three times a day (TID) | ORAL | 0 refills | Status: DC | PRN

## 2022-04-06 MED ORDER — PREDNISONE 10 MG PO TABS: ORAL_TABLET | ORAL | 0 refills | Status: AC

## 2022-04-06 NOTE — Patient Instructions (Signed)
  Sabrina Brennan, thank you for joining [name], PA-C for today's virtual visit.  While this provider is not your primary care provider (PCP), if your PCP is located in our provider database this encounter information will be shared with them immediately following your visit.  Consent: (Patient) Sabrina Brennan provided verbal consent for this virtual visit at the beginning of the encounter.  Current Medications:  Current Outpatient Medications:    sucralfate (CARAFATE) 1 g tablet, Take 1 tablet (1 g total) by mouth 4 (four) times daily -  with meals and at bedtime., Disp: 120 tablet, Rfl: 0  Current Facility-Administered Medications:    medroxyPROGESTERone (DEPO-PROVERA) injection 150 mg, 150 mg, Intramuscular, Once, Dana Allan, MD   Medications ordered in this encounter:  No orders of the defined types were placed in this encounter.    *If you need refills on other medications prior to your next appointment, please contact your pharmacy*  Follow-Up: Call back or seek an in-person evaluation if the symptoms worsen or if the condition fails to improve as anticipated.  Other Instructions Please keep hydrated and try to stay out of the sun as much as possible.  Cool compresses may be beneficial.  Take the steroid taper as directed to calm this down. The Hydroxyzine is to be used as directed, only when needed, for itch.   If not resolving or you note new/worsening symptoms despite treatment, please seek an in-person evaluation   If you have been instructed to have an in-person evaluation today at a local Urgent Care facility, please use the link below. It will take you to a list of all of our available [facility], including address, phone number and hours of operation. Please do not delay care.  [facility]  If you or a family member do not have a primary care provider, use the link below to schedule a visit and establish care. When you choose  a [facility] primary care physician or advanced practice provider, you gain a long-term partner in health. Find a Primary Care Provider  Learn more about [facility]'s in-office and virtual care options: [facility] - Get Care Now

## 2022-04-06 NOTE — Progress Notes (Signed)
Virtual Visit Consent   Sabrina Brennan, you are scheduled for a virtual visit with a [facility] provider today. Just as with appointments in the office, your consent must be obtained to participate. Your consent will be active for this visit and any virtual visit you may have with one of our providers in the next 365 days. If you have a MyChart account, a copy of this consent can be sent to you electronically.  As this is a virtual visit, video technology does not allow for your provider to perform a traditional examination. This may limit your provider's ability to fully assess your condition. If your provider identifies any concerns that need to be evaluated in person or the need to arrange testing (such as labs, EKG, etc.), we will make arrangements to do so. Although advances in technology are sophisticated, we cannot ensure that it will always work on either your end or our end. If the connection with a video visit is poor, the visit may have to be switched to a telephone visit. With either a video or telephone visit, we are not always able to ensure that we have a secure connection.  By engaging in this virtual visit, you consent to the provision of healthcare and authorize for your insurance to be billed (if applicable) for the services provided during this visit. Depending on your insurance coverage, you may receive a charge related to this service.  I need to obtain your verbal consent now. Are you willing to proceed with your visit today? Sabrina Brennan has provided verbal consent on 04/06/2022 for a virtual visit (video or telephone). [name], [place]  Date: 04/06/2022 4:37 PM  Virtual Visit via Video Note   I, [name], [name]  ([id number], [birth year]) on 04/06/22 at  4:30 PM EDT by a video-enabled telemedicine application and verified that I am speaking with the correct person using two identifiers.  Location: Patient: Virtual Visit Location  Patient: Home Provider: Virtual Visit Location Provider: Home Office   I discussed the limitations of evaluation and management by telemedicine and the availability of in person appointments. The patient expressed understanding and agreed to proceed.    History of Present Illness: Sabrina Brennan is a 31 y.o. who identifies as a female who was assigned female at birth, and is being seen today for sunburn with subsequent itching and uncomfortable rash starting while on vacation in the [place] and worsening over past couple of days. Denies fever, nausea or vomiting. Some chills. Has been trying to apply cream to the area to soothe but not helping.   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   History of abnormal cervical Pap smear 08/28/2020   High grade squamous intraepithelial lesion (HGSIL), grade 3 CIN, 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    Allergies: No Known Allergies Medications:  Current Outpatient Medications:    hydrOXYzine (VISTARIL) 25 MG capsule, Take 1 capsule (25 mg total) by mouth every 8 (eight) hours as needed., Disp: 15 capsule, Rfl: 0   predniSONE (DELTASONE) 10 MG tablet, Take 4 tablets (40 mg total) by mouth daily with breakfast for 4 days, THEN 3 tablets (30 mg total) daily with breakfast for  4 days, THEN 2 tablets (20 mg total) daily with breakfast for 3 days, THEN 1 tablet (10 mg total) daily with breakfast for 3 days., Disp: 37 tablet, Rfl: 0   sucralfate (CARAFATE) 1 g tablet, Take 1 tablet (1 g total) by mouth 4 (four) times daily -  with meals and at bedtime., Disp: 120 tablet, Rfl: 0  Current Facility-Administered Medications:    medroxyPROGESTERone (DEPO-PROVERA) injection 150 mg, 150  mg, Intramuscular, Once, Dana Allan, MD  Observations/Objective: Patient is well-developed, well-nourished in no acute distress.  Resting comfortably at home.  Head is normocephalic, atraumatic.  No labored breathing. Speech is clear and coherent with logical content.  Patient is alert and oriented at baseline.  First degree sunburn noted of face, chest and arms. Shoulders with a couple of areas of small blistering raising concern for 2nd degree sunburn. There is a maculopapular rash on shoulders and extremities.  Assessment and Plan: 1. Dermatitis due to sunburn - predniSONE (DELTASONE) 10 MG tablet; Take 4 tablets (40 mg total) by mouth daily with breakfast for 4 days, THEN 3 tablets (30 mg total) daily with breakfast for 4 days, THEN 2 tablets (20 mg total) daily with breakfast for 3 days, THEN 1 tablet (10 mg total) daily with breakfast for 3 days.  Dispense: 37 tablet; Refill: 0 - hydrOXYzine (VISTARIL) 25 MG capsule; Take 1 capsule (25 mg total) by mouth every 8 (eight) hours as needed.  Dispense: 15 capsule; Refill: 0  Skin care discussed. Start Hydroxyzine and prednisone to each reaction and pain from sunburn itself. Supportive measures and OTC medications reviewed. Follow-up in person for any non-resolving, new or worsening symptoms.   Follow Up Instructions: I discussed the assessment and treatment plan with the patient. The patient was provided an opportunity to ask questions and all were answered. The patient agreed with the plan and demonstrated an understanding of the instructions.  A copy of instructions were sent to the patient via MyChart unless otherwise noted below.   The patient was advised to call back or seek an in-person evaluation if the symptoms worsen or if the condition fails to improve as anticipated.  Time:  I spent 10 minutes with the patient via telehealth technology discussing the above problems/concerns.    [name], PA-C

## 2022-04-12 ENCOUNTER — Ambulatory Visit (INDEPENDENT_AMBULATORY_CARE_PROVIDER_SITE_OTHER): Payer: BC Managed Care – PPO

## 2022-04-12 DIAGNOSIS — Z30019 Encounter for initial prescription of contraceptives, unspecified: Secondary | ICD-10-CM

## 2022-04-12 NOTE — Progress Notes (Signed)
Patient here today for Depo Provera injection and is within her dates.    Last contraceptive appt was 10/29/2021  Depo given in RUOQ today.  Site unremarkable & patient tolerated injection.    Next injection due 06/28/22-07/12/2022.  Reminder card given.    Veronda Prude, RN

## 2022-04-20 ENCOUNTER — Ambulatory Visit: Payer: BC Managed Care – PPO

## 2022-04-20 ENCOUNTER — Telehealth: Payer: BC Managed Care – PPO | Admitting: Physician Assistant

## 2022-04-20 DIAGNOSIS — G43909 Migraine, unspecified, not intractable, without status migrainosus: Secondary | ICD-10-CM | POA: Diagnosis not present

## 2022-04-20 MED ORDER — ZOLMITRIPTAN 5 MG PO TBDP: 5.0000 mg | ORAL_TABLET | Freq: Once | ORAL | 0 refills | Status: DC | PRN

## 2022-04-20 MED ORDER — ZOLMITRIPTAN 5 MG PO TBDP
5.0000 mg | ORAL_TABLET | ORAL | 0 refills | Status: DC | PRN
Start: 2022-04-20 — End: 2022-04-20

## 2022-04-20 NOTE — Patient Instructions (Signed)
Sabrina Brennan, thank you for joining [name], PA-C for today's virtual visit.  While this provider is not your primary care provider (PCP), if your PCP is located in our provider database this encounter information will be shared with them immediately following your visit.  Consent: (Patient) Sabrina Brennan provided verbal consent for this virtual visit at the beginning of the encounter.  Current Medications:  Current Outpatient Medications:    hydrOXYzine (VISTARIL) 25 MG capsule, Take 1 capsule (25 mg total) by mouth every 8 (eight) hours as needed., Disp: 15 capsule, Rfl: 0   sucralfate (CARAFATE) 1 g tablet, Take 1 tablet (1 g total) by mouth 4 (four) times daily -  with meals and at bedtime., Disp: 120 tablet, Rfl: 0   Medications ordered in this encounter:  No orders of the defined types were placed in this encounter.    *If you need refills on other medications prior to your next appointment, please contact your pharmacy*  Follow-Up: Call back or seek an in-person evaluation if the symptoms worsen or if the condition fails to improve as anticipated.  Other Instructions Migraine Headache A migraine headache is a very strong throbbing pain on one side or both sides of your head. This type of headache can also cause other symptoms. It can last from 4 hours to 3 days. Talk with your doctor about what things may bring on (trigger) this condition. What are the causes? The exact cause of this condition is not known. This condition may be triggered or caused by: Drinking alcohol. Smoking. Taking medicines, such as: Medicine used to treat chest pain (nitroglycerin). Birth control pills. Estrogen. Some blood pressure medicines. Eating or drinking certain products. Doing physical activity. Other things that may trigger a migraine headache include: Having a menstrual period. Pregnancy. Hunger. Stress. Not getting enough sleep or getting too much sleep. Weather  changes. Tiredness (fatigue). What increases the risk? Being 38-37 years old. Being female. Having a family history of migraine headaches. Being Caucasian. Having depression or anxiety. Being very overweight. What are the signs or symptoms? A throbbing pain. This pain may: Happen in any area of the head, such as on one side or both sides. Make it hard to do daily activities. Get worse with physical activity. Get worse around bright lights or loud noises. Other symptoms may include: Feeling sick to your stomach (nauseous). Vomiting. Dizziness. Being sensitive to bright lights, loud noises, or smells. Before you get a migraine headache, you may get warning signs (an aura). An aura may include: Seeing flashing lights or having blind spots. Seeing bright spots, halos, or zigzag lines. Having tunnel vision or blurred vision. Having numbness or a tingling feeling. Having trouble talking. Having weak muscles. Some people have symptoms after a migraine headache (postdromal phase), such as: Tiredness. Trouble thinking (concentrating). How is this treated? Taking medicines that: Relieve pain. Relieve the feeling of being sick to your stomach. Prevent migraine headaches. Treatment may also include: Having acupuncture. Avoiding foods that bring on migraine headaches. Learning ways to control your body functions (biofeedback). Therapy to help you know and deal with negative thoughts (cognitive behavioral therapy). Follow these instructions at home: Medicines Take over-the-counter and prescription medicines only as told by your doctor. Ask your doctor if the medicine prescribed to you: Requires you to avoid driving or using heavy machinery. Can cause trouble pooping (constipation). You may need to take these steps to prevent or treat trouble pooping: Drink enough fluid to keep your pee (  urine) pale yellow. Take over-the-counter or prescription medicines. Eat foods that are high in  fiber. These include beans, whole grains, and fresh fruits and vegetables. Limit foods that are high in fat and sugar. These include fried or sweet foods. Lifestyle Do not drink alcohol. Do not use any products that contain nicotine or tobacco, such as cigarettes, e-cigarettes, and chewing tobacco. If you need help quitting, ask your doctor. Get at least 8 hours of sleep every night. Limit and deal with stress. General instructions     Keep a journal to find out what may bring on your migraine headaches. For example, write down: What you eat and drink. How much sleep you get. Any change in what you eat or drink. Any change in your medicines. If you have a migraine headache: Avoid things that make your symptoms worse, such as bright lights. It may help to lie down in a dark, quiet room. Do not drive or use heavy machinery. Ask your doctor what activities are safe for you. Keep all follow-up visits as told by your doctor. This is important. Contact a doctor if: You get a migraine headache that is different or worse than others you have had. You have more than 15 headache days in one month. Get help right away if: Your migraine headache gets very bad. Your migraine headache lasts longer than 72 hours. You have a fever. You have a stiff neck. You have trouble seeing. Your muscles feel weak or like you cannot control them. You start to lose your balance a lot. You start to have trouble walking. You pass out (faint). You have a seizure. Summary A migraine headache is a very strong throbbing pain on one side or both sides of your head. These headaches can also cause other symptoms. This condition may be treated with medicines and changes to your lifestyle. Keep a journal to find out what may bring on your migraine headaches. Contact a doctor if you get a migraine headache that is different or worse than others you have had. Contact your doctor if you have more than 15 headache days  in a month. This information is not intended to replace advice given to you by your health care provider. Make sure you discuss any questions you have with your health care provider. Document Revised: 11/10/2018 Document Reviewed: 08/31/2018 Elsevier Patient Education  2023 Elsevier Inc.    If you have been instructed to have an in-person evaluation today at a local Urgent Care facility, please use the link below. It will take you to a list of all of our available [facility], including address, phone number and hours of operation. Please do not delay care.  [facility]  If you or a family member do not have a primary care provider, use the link below to schedule a visit and establish care. When you choose a [facility] primary care physician or advanced practice provider, you gain a long-term partner in health. Find a Primary Care Provider  Learn more about [facility]'s in-office and virtual care options:  - Get Care Now

## 2022-04-20 NOTE — Progress Notes (Signed)
Virtual Visit Consent   [name], you are scheduled for a virtual visit with a [facility] provider today. Just as with appointments in the office, your consent must be obtained to participate. Your consent will be active for this visit and any virtual visit you may have with one of our providers in the next 365 days. If you have a MyChart account, a copy of this consent can be sent to you electronically.  As this is a virtual visit, video technology does not allow for your provider to perform a traditional examination. This may limit your provider's ability to fully assess your condition. If your provider identifies any concerns that need to be evaluated in person or the need to arrange testing (such as labs, EKG, etc.), we will make arrangements to do so. Although advances in technology are sophisticated, we cannot ensure that it will always work on either your end or our end. If the connection with a video visit is poor, the visit may have to be switched to a telephone visit. With either a video or telephone visit, we are not always able to ensure that we have a secure connection.  By engaging in this virtual visit, you consent to the provision of healthcare and authorize for your insurance to be billed (if applicable) for the services provided during this visit. Depending on your insurance coverage, you may receive a charge related to this service.  I need to obtain your verbal consent now. Are you willing to proceed with your visit today? Sabrina Brennan has provided verbal consent on 04/20/2022 for a virtual visit (video or telephone). [name], [place]  Date: 04/20/2022 4:10 PM  Virtual Visit via Video Note   I, [name], [name]  ([id number], 08-08-1990) on 04/20/22 at  4:00 PM EDT by a video-enabled telemedicine application and verified that I am speaking with the correct person using two identifiers.  Location: Patient: Virtual Visit Location  Patient: Mobile Provider: Virtual Visit Location Provider: Home Office   I discussed the limitations of evaluation and management by telemedicine and the availability of in person appointments. The patient expressed understanding and agreed to proceed.    History of Present Illness: Sabrina Brennan is a 31 y.o. who identifies as a female who was assigned female at birth, and is being seen today for migraine headaches off and on over the past week. Has history of frequent migraines for which she was previously on Ajovy with some success. Has not taken in a while or followed up with Neurology. Thinks current headaches are triggered by stress. Notes associated photophobia without nausea or vomiting. Denies aura. . Has taken Excedrin which helps but does not fully resolve headaches. Usually has to sleep. Denies fever, chills, URI symptoms.     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  Resting comfortably at home.  Head is normocephalic, atraumatic.  No labored breathing. Speech is clear and coherent with  logical content.  Patient is alert and oriented at baseline.   Assessment and Plan: 1. Acute migraine  Supportive measures and OTC medications reviewed. Rx Zomig SL to use as directed for abortive therapy. Keep symptom journal. Contact PCP and Neurologist for follow-up.   Follow Up Instructions: I discussed the assessment and treatment plan with the patient. The patient was provided an opportunity to ask questions and all were answered. The patient agreed with the plan and demonstrated an understanding of the instructions.  A copy of instructions were sent to the patient via MyChart unless otherwise noted below.   The patient was advised to call back or seek an in-person evaluation if the symptoms worsen or if the condition fails to improve as anticipated.  Time:  I spent 10 minutes with the patient via telehealth technology discussing the above problems/concerns.    [name], PA-C

## 2022-04-23 ENCOUNTER — Telehealth: Payer: BC Managed Care – PPO | Admitting: Physician Assistant

## 2022-04-23 ENCOUNTER — Ambulatory Visit: Payer: BC Managed Care – PPO

## 2022-04-23 DIAGNOSIS — B9689 Other specified bacterial agents as the cause of diseases classified elsewhere: Secondary | ICD-10-CM

## 2022-04-23 DIAGNOSIS — J019 Acute sinusitis, unspecified: Secondary | ICD-10-CM | POA: Diagnosis not present

## 2022-04-23 MED ORDER — AMOXICILLIN-POT CLAVULANATE 875-125 MG PO TABS: 1.0000 | ORAL_TABLET | Freq: Two times a day (BID) | ORAL | 0 refills | Status: DC

## 2022-04-23 NOTE — Patient Instructions (Signed)
Sabrina Brennan, thank you for joining [name], PA-C for today's virtual visit.  While this provider is not your primary care provider (PCP), if your PCP is located in our provider database this encounter information will be shared with them immediately following your visit.  Consent: (Patient) Sabrina Brennan provided verbal consent for this virtual visit at the beginning of the encounter.  Current Medications:  Current Outpatient Medications:    amoxicillin-clavulanate (AUGMENTIN) 875-125 MG tablet, Take 1 tablet by mouth 2 (two) times daily., Disp: 14 tablet, Rfl: 0   hydrOXYzine (VISTARIL) 25 MG capsule, Take 1 capsule (25 mg total) by mouth every 8 (eight) hours as needed., Disp: 15 capsule, Rfl: 0   zolmitriptan (ZOMIG-ZMT) 5 MG disintegrating tablet, Take 1 tablet (5 mg total) by mouth once as needed for up to 1 dose for migraine., Disp: 10 tablet, Rfl: 0   Medications ordered in this encounter:  Meds ordered this encounter  Medications   amoxicillin-clavulanate (AUGMENTIN) 875-125 MG tablet    Sig: Take 1 tablet by mouth 2 (two) times daily.    Dispense:  14 tablet    Refill:  0    Order Specific Question:   Supervising Provider    Answer:   Merrilee Jansky [id number]     *If you need refills on other medications prior to your next appointment, please contact your pharmacy*  Follow-Up: Call back or seek an in-person evaluation if the symptoms worsen or if the condition fails to improve as anticipated.  [facility] [phone]  Other Instructions Sinus Infection, Adult A sinus infection, also called sinusitis, is inflammation of your sinuses. Sinuses are hollow spaces in the bones around your face. Your sinuses are located: Around your eyes. In the middle of your forehead. Behind your nose. In your cheekbones. Mucus normally drains out of your sinuses. When your nasal tissues become inflamed or swollen, mucus can become trapped or blocked.  This allows bacteria, viruses, and fungi to grow, which leads to infection. Most infections of the sinuses are caused by a virus. A sinus infection can develop quickly. It can last for up to 4 weeks (acute) or for more than 12 weeks (chronic). A sinus infection often develops after a cold. What are the causes? This condition is caused by anything that creates swelling in the sinuses or stops mucus from draining. This includes: Allergies. Asthma. Infection from bacteria or viruses. Deformities or blockages in your nose or sinuses. Abnormal growths in the nose (nasal polyps). Pollutants, such as chemicals or irritants in the air. Infection from fungi. This is rare. What increases the risk? You are more likely to develop this condition if you: Have a weak body defense system (immune system). Do a lot of swimming or diving. Overuse nasal sprays. Smoke. What are the signs or symptoms? The main symptoms of this condition are pain and a feeling of pressure around the affected sinuses. Other symptoms include: Stuffy nose or congestion that makes it difficult to breathe through your nose. Thick yellow or greenish drainage from your nose. Tenderness, swelling, and warmth over the affected sinuses. A cough that may get worse at night. Decreased sense of smell and taste. Extra mucus that collects in the throat or the back of the nose (postnasal drip) causing a sore throat or bad breath. Tiredness (fatigue). Fever. How is this diagnosed? This condition is diagnosed based on: Your symptoms. Your medical history. A physical exam. Tests to find out if your condition  is acute or chronic. This may include: Checking your nose for nasal polyps. Viewing your sinuses using a device that has a light (endoscope). Testing for allergies or bacteria. Imaging tests, such as an MRI or CT scan. In rare cases, a bone biopsy may be done to rule out more serious types of fungal sinus disease. How is this  treated? Treatment for a sinus infection depends on the cause and whether your condition is chronic or acute. If caused by a virus, your symptoms should go away on their own within 10 days. You may be given medicines to relieve symptoms. They include: Medicines that shrink swollen nasal passages (decongestants). A spray that eases inflammation of the nostrils (topical intranasal corticosteroids). Rinses that help get rid of thick mucus in your nose (nasal saline washes). Medicines that treat allergies (antihistamines). Over-the-counter pain relievers. If caused by bacteria, your health care provider may recommend waiting to see if your symptoms improve. Most bacterial infections will get better without antibiotic medicine. You may be given antibiotics if you have: A severe infection. A weak immune system. If caused by narrow nasal passages or nasal polyps, surgery may be needed. Follow these instructions at home: Medicines Take, use, or apply over-the-counter and prescription medicines only as told by your health care provider. These may include nasal sprays. If you were prescribed an antibiotic medicine, take it as told by your health care provider. Do not stop taking the antibiotic even if you start to feel better. Hydrate and humidify  Drink enough fluid to keep your urine pale yellow. Staying hydrated will help to thin your mucus. Use a cool mist humidifier to keep the humidity level in your home above 50%. Inhale steam for 10-15 minutes, 3-4 times a day, or as told by your health care provider. You can do this in the bathroom while a hot shower is running. Limit your exposure to cool or dry air. Rest Rest as much as possible. Sleep with your head raised (elevated). Make sure you get enough sleep each night. General instructions  Apply a warm, moist washcloth to your face 3-4 times a day or as told by your health care provider. This will help with discomfort. Use nasal saline washes  as often as told by your health care provider. Wash your hands often with soap and water to reduce your exposure to germs. If soap and water are not available, use hand sanitizer. Do not smoke. Avoid being around people who are smoking (secondhand smoke). Keep all follow-up visits. This is important. Contact a health care provider if: You have a fever. Your symptoms get worse. Your symptoms do not improve within 10 days. Get help right away if: You have a severe headache. You have persistent vomiting. You have severe pain or swelling around your face or eyes. You have vision problems. You develop confusion. Your neck is stiff. You have trouble breathing. These symptoms may be an emergency. Get help right away. Call 911. Do not wait to see if the symptoms will go away. Do not drive yourself to the hospital. Summary A sinus infection is soreness and inflammation of your sinuses. Sinuses are hollow spaces in the bones around your face. This condition is caused by nasal tissues that become inflamed or swollen. The swelling traps or blocks the flow of mucus. This allows bacteria, viruses, and fungi to grow, which leads to infection. If you were prescribed an antibiotic medicine, take it as told by your health care provider. Do not stop taking  the antibiotic even if you start to feel better. Keep all follow-up visits. This is important. This information is not intended to replace advice given to you by your health care provider. Make sure you discuss any questions you have with your health care provider. Document Revised: 06/23/2021 Document Reviewed: 06/23/2021 Elsevier Patient Education  2023 Elsevier Inc.    If you have been instructed to have an in-person evaluation today at a local Urgent Care facility, please use the link below. It will take you to a list of all of our available [facility], including address, phone number and hours of operation. Please do not delay care.   [facility]  If you or a family member do not have a primary care provider, use the link below to schedule a visit and establish care. When you choose a [facility] primary care physician or advanced practice provider, you gain a long-term partner in health. Find a Primary Care Provider  Learn more about 's in-office and virtual care options:  - Get Care Now

## 2022-04-23 NOTE — Progress Notes (Signed)
Virtual Visit Consent   Sabrina Brennan, you are scheduled for a virtual visit with a [facility] provider today. Just as with appointments in the office, your consent must be obtained to participate. Your consent will be active for this visit and any virtual visit you may have with one of our providers in the next 365 days. If you have a MyChart account, a copy of this consent can be sent to you electronically.  As this is a virtual visit, video technology does not allow for your provider to perform a traditional examination. This may limit your provider's ability to fully assess your condition. If your provider identifies any concerns that need to be evaluated in person or the need to arrange testing (such as labs, EKG, etc.), we will make arrangements to do so. Although advances in technology are sophisticated, we cannot ensure that it will always work on either your end or our end. If the connection with a video visit is poor, the visit may have to be switched to a telephone visit. With either a video or telephone visit, we are not always able to ensure that we have a secure connection.  By engaging in this virtual visit, you consent to the provision of healthcare and authorize for your insurance to be billed (if applicable) for the services provided during this visit. Depending on your insurance coverage, you may receive a charge related to this service.  I need to obtain your verbal consent now. Are you willing to proceed with your visit today? Sabrina Brennan has provided verbal consent on 04/23/2022 for a virtual visit (video or telephone). [name], PA-C  Date: 04/23/2022 2:18 PM  Virtual Visit via Video Note   I, [name], [name]  ([id number], 1991-07-08) on 04/23/22 at  2:15 PM EDT by a video-enabled telemedicine application and verified that I am speaking with the correct person using two identifiers.  Location: Patient: Virtual Visit Location  Patient: Home Provider: Virtual Visit Location Provider: Home Office   I discussed the limitations of evaluation and management by telemedicine and the availability of in person appointments. The patient expressed understanding and agreed to proceed.    History of Present Illness: Sabrina Brennan is a 31 y.o. who identifies as a female who was assigned female at birth, and is being seen today for possible sinus infection.  HPI: Sinusitis This is a new problem. The current episode started in the past 7 days (Had been having off and on headaches over last week. Was seen on 04/20/22 for migraines, then next day developed head congestion and sinus pain.). The problem has been gradually worsening since onset. There has been no fever. The pain is mild. Associated symptoms include congestion, ear pain (bilateral), headaches, a hoarse voice, sinus pressure and a sore throat. Pertinent negatives include no chills or coughing. Past treatments include acetaminophen (theraflu, nyquil). The treatment provided no relief.  At home covid testing is negative.   Problems:  Patient Active Problem List   Diagnosis Date Noted   Abdominal pain 01/31/2022   Conjunctivitis of left eye 12/01/2021   Vaginal discharge 11/23/2021   Uses birth control 10/29/2021   Pityriasis versicolor 11/26/2020   Hemiplegic migraine without status migrainosus, not intractable 10/29/2020   History of abnormal cervical Pap smear 08/28/2020   High grade squamous intraepithelial lesion (HGSIL), grade 3 CIN, 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    Allergies: No Known Allergies Medications:  Current Outpatient Medications:  amoxicillin-clavulanate (AUGMENTIN) 875-125 MG tablet, Take 1 tablet by mouth 2 (two) times daily., Disp: 14 tablet, Rfl: 0   hydrOXYzine (VISTARIL) 25 MG capsule, Take 1 capsule (25 mg total) by mouth every 8 (eight) hours as needed., Disp: 15 capsule, Rfl: 0   zolmitriptan (ZOMIG-ZMT) 5 MG disintegrating tablet, Take  1 tablet (5 mg total) by mouth once as needed for up to 1 dose for migraine., Disp: 10 tablet, Rfl: 0  Observations/Objective: Patient is well-developed, well-nourished in no acute distress.  Resting comfortably at home.  Head is normocephalic, atraumatic.  No labored breathing.  Speech is clear and coherent with logical content.  Patient is alert and oriented at baseline.    Assessment and Plan: 1. Acute bacterial sinusitis - amoxicillin-clavulanate (AUGMENTIN) 875-125 MG tablet; Take 1 tablet by mouth 2 (two) times daily.  Dispense: 14 tablet; Refill: 0  - Worsening symptoms that have not responded to OTC medications.  - Will give Augmentin - Continue allergy medications.  - Steam and humidifier can help - Stay well hydrated and get plenty of rest.  - Seek in person evaluation if no symptom improvement or if symptoms worsen   Follow Up Instructions: I discussed the assessment and treatment plan with the patient. The patient was provided an opportunity to ask questions and all were answered. The patient agreed with the plan and demonstrated an understanding of the instructions.  A copy of instructions were sent to the patient via MyChart unless otherwise noted below.    The patient was advised to call back or seek an in-person evaluation if the symptoms worsen or if the condition fails to improve as anticipated.  Time:  I spent 10 minutes with the patient via telehealth technology discussing the above problems/concerns.    Mar Daring, PA-C

## 2022-04-30 ENCOUNTER — Ambulatory Visit (INDEPENDENT_AMBULATORY_CARE_PROVIDER_SITE_OTHER): Payer: BC Managed Care – PPO | Admitting: Student

## 2022-04-30 ENCOUNTER — Encounter: Payer: Self-pay | Admitting: Student

## 2022-04-30 VITALS — BP 108/72 | HR 69 | Wt 168.6 lb

## 2022-04-30 DIAGNOSIS — Z01818 Encounter for other preprocedural examination: Secondary | ICD-10-CM | POA: Diagnosis not present

## 2022-04-30 LAB — POCT URINALYSIS DIP (MANUAL ENTRY)
Bilirubin, UA: NEGATIVE
Glucose, UA: NEGATIVE mg/dL
Ketones, POC UA: NEGATIVE mg/dL
Leukocytes, UA: NEGATIVE
Nitrite, UA: NEGATIVE
Protein Ur, POC: NEGATIVE mg/dL
Spec Grav, UA: <=1.005 — AB (ref 1.010–1.025)
Urobilinogen, UA: 0.2 E.U./dL
pH, UA: 6 (ref 5.0–8.0)

## 2022-04-30 LAB — POCT GLYCOSYLATED HEMOGLOBIN (HGB A1C): Hemoglobin A1C: 5.3 % (ref 4.0–5.6)

## 2022-04-30 NOTE — Progress Notes (Signed)
A1c    SUBJECTIVE:   CHIEF COMPLAINT / HPI:   Sabrina Brennan is a 31 year-old here for pre-operative evaluation. She is having lipo 360 with fat transfer to buttocks on 06/08/2022 at [facility] by Dr. Jerrye Beavers. She has had this operation under general anesthesia once already and tolerated well.  Procedure: Lipo 360 Procedural risk: Intermediate-High risk   Anesthesia: general   PMH: Migraines Surgical History: Lipo 360, Cyst removal on thigh The patient denies any complication with anesthesia, bleeding, or post-operative confusion, nausea, or vomiting in prior surgical interventions. The patient denies any  history of spinal surgeries. Medications: Depo-Provera, Zolmitriptan  Family History: The patient denies any family history of complications with anesthesia and VTE.  Social History: Tobacco Use: None- sometimes smokes Hookah  Alcohol Use: Occasional- wine Other substance use: Occasional marijuana use  PERTINENT  PMH / PSH: Migraines  OBJECTIVE:   BP 108/72   Pulse 69   Wt 168 lb 9.6 oz (76.5 kg)   SpO2 100%   BMI 34.05 kg/m   General: Alert and cooperative and appears to be in no acute distress Cardio: Normal S1 and S2, no S3 or S4. Rhythm is regular. No murmurs or rubs.   Pulm: Clear to auscultation bilaterally, no crackles, wheezing, or diminished breath sounds. Normal respiratory effort Abdomen: Bowel sounds normal. Abdomen soft and non-tender.  Extremities: No peripheral edema. Warm/ well perfused.  Strong radial pulses. Neuro: Cranial nerves grossly intact   ASSESSMENT/PLAN:   Risk Stratification Tool: Lyndel Safe Calculator: 0.0 % risk of MI or cardiac event, intraoperatively or up to 30 days post-op  Functional Capacity: > 4 METS  ECG obtained today. NSR with no acute ST/T wave changes.  Final Assessment:  The patient is at low risk of complications from a moderate risk surgery.  I obtained CBC, CMP, PT/INR, Hepatitis panel, UA, Hcg, Hgb A1c,  TSH, HIV ab I recommend the following changes to medications in the perioperative setting: None.   Sabrina Brennan, Sabrina Brennan

## 2022-04-30 NOTE — Patient Instructions (Signed)
It was wonderful to meet you  I will see you on Friday, October 13th at 8:30 AM, arrive 15 minutes early   You are low-risk for your surgery and I will fax the papers over to the surgeon   If you have any questions or concerns, please feel free to call the clinic.    Be well,  Dr. Orvis Brill [facility] [phone]

## 2022-05-01 LAB — TSH RFX ON ABNORMAL TO FREE T4: TSH: 0.348 u[IU]/mL — ABNORMAL LOW (ref 0.450–4.500)

## 2022-05-01 LAB — COMPREHENSIVE METABOLIC PANEL
ALT: 13 IU/L (ref 0–32)
AST: 13 IU/L (ref 0–40)
Albumin/Globulin Ratio: 1.7 (ref 1.2–2.2)
Albumin: 4.5 g/dL (ref 4.0–5.0)
Alkaline Phosphatase: 73 IU/L (ref 44–121)
BUN/Creatinine Ratio: 9 (ref 9–23)
BUN: 8 mg/dL (ref 6–20)
Bilirubin Total: 0.2 mg/dL (ref 0.0–1.2)
CO2: 18 mmol/L — ABNORMAL LOW (ref 20–29)
Calcium: 9.4 mg/dL (ref 8.7–10.2)
Chloride: 104 mmol/L (ref 96–106)
Creatinine, Ser: 0.9 mg/dL (ref 0.57–1.00)
Globulin, Total: 2.7 g/dL (ref 1.5–4.5)
Glucose: 100 mg/dL — ABNORMAL HIGH (ref 70–99)
Potassium: 3.6 mmol/L (ref 3.5–5.2)
Sodium: 141 mmol/L (ref 134–144)
Total Protein: 7.2 g/dL (ref 6.0–8.5)
eGFR: 89 mL/min/{1.73_m2} (ref >59)

## 2022-05-01 LAB — CBC WITH DIFFERENTIAL/PLATELET
Basophils Absolute: 0 10*3/uL (ref 0.0–0.2)
Basos: 0 %
EOS (ABSOLUTE): 0.1 10*3/uL (ref 0.0–0.4)
Eos: 1 %
Hematocrit: 40.5 % (ref 34.0–46.6)
Hemoglobin: 13.9 g/dL (ref 11.1–15.9)
Immature Grans (Abs): 0 10*3/uL (ref 0.0–0.1)
Immature Granulocytes: 0 %
Lymphocytes Absolute: 2.4 10*3/uL (ref 0.7–3.1)
Lymphs: 37 %
MCH: 31.2 pg (ref 26.6–33.0)
MCHC: 34.3 g/dL (ref 31.5–35.7)
MCV: 91 fL (ref 79–97)
Monocytes Absolute: 0.4 10*3/uL (ref 0.1–0.9)
Monocytes: 6 %
Neutrophils Absolute: 3.7 10*3/uL (ref 1.4–7.0)
Neutrophils: 56 %
Platelets: 409 10*3/uL (ref 150–450)
RBC: 4.46 x10E6/uL (ref 3.77–5.28)
RDW: 11.8 % (ref 11.7–15.4)
WBC: 6.6 10*3/uL (ref 3.4–10.8)

## 2022-05-01 LAB — HCV INTERPRETATION

## 2022-05-01 LAB — T4F: T4,Free (Direct): 1.23 ng/dL (ref 0.82–1.77)

## 2022-05-01 LAB — APTT: aPTT: 32 s (ref 24–33)

## 2022-05-01 LAB — PROTIME-INR
INR: 1 (ref 0.9–1.2)
Prothrombin Time: 10.9 s (ref 9.1–12.0)

## 2022-05-01 LAB — HIV ANTIBODY (ROUTINE TESTING W REFLEX): HIV Screen 4th Generation wRfx: NONREACTIVE

## 2022-05-01 LAB — BETA HCG QUANT (REF LAB): hCG Quant: <1 m[IU]/mL

## 2022-05-01 LAB — HCV AB W REFLEX TO QUANT PCR: HCV Ab: NONREACTIVE

## 2022-05-01 LAB — HEPATITIS B SURFACE ANTIGEN: Hepatitis B Surface Ag: NEGATIVE

## 2022-05-13 NOTE — Patient Instructions (Addendum)
So great to see you today.  I will plan to see you shortly after your surgery.   In the meantime, try and get in contact with a therapist and schedule an appointment. As we discussed, this might take some trial and error to find the right therapist for you.  I will work with you on this.  Go to "Psychology Today" and you can find therapists in [place] that take [employer]. They have a bio to read so you can see who you like best.   Be well,  Dr. Darral Dash [facility] [phone]   Therapy and Counseling Resources Most providers on this list will take Medicaid. Patients with commercial insurance or Medicare should contact their insurance company to get a list of in network providers.  [facility] (takes children) Location 1: [address], [address] [place], [place] [address] Location 2: [address] [place], [place] [address] [phone]   [facility] (spanish speaking therapist available)(habla espanol)(take medicare and medicaid)  [address], [place], [place] [address], [place] [email or web address] [phone]  [facility] Counseling [address] [facility] [place] [place], [place] [address] [phone]  [facility]   [address], [address] [place], [place] [address]      [phone]  [facility] (spanish available) [address]  [place], [place] [address] [phone]  [facility] (take [facility] and medicare) [address], [address]  [place], [place] [address]       [phone]     [facility] (virtual only) [phone]  Jovita Kussmaul [facility] [address] [place], [place] [phone]  [facility]:  [address] [place] [phone]  [facility]:  [address] [place]ie [place]iener [phone]  [facility] (under & uninsured) [address]   [place] [phone]     [email or web address]    [facility] [address]  [place]    [phone]  [facility] -[address]     Phone:  [phone]     [facility]  [address] [place]  [phone]   [facility] [address]      [place], [place] [phone]  [facility]: [address] [place], , [place]  [phone]   [facility] (Spanish therapist) [email or web address]  [address] [place]  [address]   [place] [address]    [phone]    [facility]   [address],  [place], [place]  [phone]   [facility]  [address] [place]  [phone]  [facility]  [address] [place], [place]        [phone]  Open Access/Walk In Clinic under & uninsured  [facility]  [address] [place], [place] [phone] Crisis [phone]  [facility] [place],  (Spanish)   [address], [place]: [phone]) 8:30 - 12; 1 - 2:30  [facility] [employer],  [address] [place], [place]    [phone]):8:30 - 12; 2 - 3PM  [facility] [employer],  [address],  [place]; [phone]):   Mon - Fri 8 AM - 5 PM  [facility] [address] [place]  MWF 12:30 to 3:00 or call to schedule an appointment  [phone]  Specific Provider options Psychology Today  [email or web address] click on find a therapist  enter your zip code left side and select or tailor a therapist for your specific need.   [facility] Provider Directory [email or web address]  (Medicaid)   Follow all drop down to find a provider  [place] or [email or web address] [address], [place], [place] Recovery support and educational   24- Hour  Availability:   [facility]  [address] [place], [place] Crisis [phone]  [facility] [employer] [phone]  [place]  [phone]   [place]  [phone] (after hours)  [facility]rapeutic [facility]/Mobile Crisis   [phone]  [place] National Suicide Hotline  [phone] Diamantina Monks)  Call 911 or go to emergency room  [facility]  [phone]);  [place] and [employer]  [phone]); [place], [place], [place], [place], [place], [place], [place]

## 2022-05-13 NOTE — Progress Notes (Addendum)
    SUBJECTIVE:   CHIEF COMPLAINT / HPI:   Sabrina Brennan is a 31 year old female here for follow-up of headaches. 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 She is taking Zolmitriptan as needed for headache. Took 7 Excedrin Migraine medications yesterday.  Headaches: Laterality:Mostly unilateral, but will sometimes go to both sides Nausea/vomiting: No Photophobia: Yes Photophobia: Debilitating: Yes, needs to sleep for days Frequency: Gets them 4 times a week, lasts for 2-3 days Changes disposition: Remedies tried: Ajovy, Excedrin, Topiramate, Propranolol  Per neurology's note, the recommendation was to try some therapy.  I discussed this with Sabrina Brennan today and she is very open to starting psychotherapy.  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  She is also here to get a repeat thyroid panel which her surgeon is specifically requesting prior to her lipo 360 on November 7.  PERTINENT  PMH / PSH: Reviewed  OBJECTIVE:   BP 110/78   Pulse 95   Wt 170 lb 9.6 oz (77.4 kg)   SpO2 99%   BMI 34.46 kg/m   General: Pleasant, alert and cooperative and appears to be in no acute distress Cardio: Normal S1 and S2, no S3 or S4. Rhythm is regular. No murmurs or rubs.   Pulm: Clear to auscultation bilaterally, no crackles, wheezing, or diminished breath sounds. Normal respiratory effort Extremities: No peripheral edema. Warm/ well perfused.  Strong radial pulses. Neuro: Cranial nerves grossly intact.  Speech clear and fluent    ASSESSMENT/PLAN:   Preop examination Prior TSH was low with normal T4.  I did discuss that this is subclinical hyperthyroidism.  Surgeon is still requesting a full thyroid panel which we obtained today.  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  Working closely with neurology.  Has had brain MRI in the past. I discussed mind-body connection with Sabrina Brennan and that her stressors are likely large contributors to her headaches although she does have migraines and that her symptoms are not solely due to mental health. She is amenable to CBT/psychotherapy.  I provided her with a list of therapist and asked that she schedule an appointment. I will plan to see her next month in November to follow-up on this and see how she is doing postop.  I really encouraged her to stop taking too much Excedrin, for concern for potentially causing damage to her liver and kidneys as well as developing ulcers.     Orvis Brill, [place]

## 2022-05-14 ENCOUNTER — Ambulatory Visit (INDEPENDENT_AMBULATORY_CARE_PROVIDER_SITE_OTHER): Payer: BC Managed Care – PPO | Admitting: Student

## 2022-05-14 ENCOUNTER — Encounter: Payer: Self-pay | Admitting: Student

## 2022-05-14 VITALS — BP 110/78 | HR 95 | Wt 170.6 lb

## 2022-05-14 DIAGNOSIS — G43701 Chronic migraine without aura, not intractable, with status migrainosus: Secondary | ICD-10-CM | POA: Insufficient documentation

## 2022-05-14 DIAGNOSIS — Z01818 Encounter for other preprocedural examination: Secondary | ICD-10-CM | POA: Diagnosis not present

## 2022-05-14 NOTE — Assessment & Plan Note (Signed)
Ongoing for years.  Working closely with neurology.  Has had brain MRI in the past. I discussed mind-body connection with Sabrina Brennan and that her stressors are likely large contributors to her headaches although she does have migraines and that her symptoms are not solely due to mental health. She is amenable to CBT/psychotherapy.  I provided her with a list of therapist and asked that she schedule an appointment. I will plan to see her next month in November to follow-up on this and see how she is doing postop.  I really encouraged her to stop taking too much Excedrin, for concern for potentially causing damage to her liver and kidneys as well as developing ulcers.

## 2022-05-14 NOTE — Assessment & Plan Note (Addendum)
Prior TSH was low with normal T4.  I did discuss that this is subclinical hyperthyroidism.  Surgeon is still requesting a full thyroid panel which we obtained today.

## 2022-05-15 LAB — THYROID PANEL WITH TSH
Free Thyroxine Index: 2 (ref 1.2–4.9)
T3 Uptake Ratio: 28 % (ref 24–39)
T4, Total: 7.2 ug/dL (ref 4.5–12.0)
TSH: 0.764 u[IU]/mL (ref 0.450–4.500)

## 2022-05-17 ENCOUNTER — Encounter: Payer: Self-pay | Admitting: Student

## 2022-05-17 NOTE — Telephone Encounter (Signed)
Called patient. She states that she forgot to ask Dr. Owens Shark a question from visit on Friday. She is requesting returned call to discuss. She can be reached at [phone]. States it would be preferred if provider could call her after 4:00 pm.   Forwarding to PCP.   Talbot Grumbling, RN

## 2022-05-24 ENCOUNTER — Encounter: Payer: Self-pay | Admitting: Student

## 2022-05-24 MED ORDER — ZOLMITRIPTAN 5 MG PO TBDP: 5.0000 mg | ORAL_TABLET | Freq: Once | ORAL | 0 refills | Status: DC | PRN

## 2022-06-08 ENCOUNTER — Encounter: Payer: Self-pay | Admitting: Student

## 2022-06-11 ENCOUNTER — Ambulatory Visit (INDEPENDENT_AMBULATORY_CARE_PROVIDER_SITE_OTHER): Payer: BC Managed Care – PPO | Admitting: Student

## 2022-06-11 ENCOUNTER — Encounter: Payer: Self-pay | Admitting: Student

## 2022-06-11 VITALS — BP 118/74 | HR 108 | Ht 59.0 in | Wt 170.0 lb

## 2022-06-11 DIAGNOSIS — Z9889 Other specified postprocedural states: Secondary | ICD-10-CM | POA: Diagnosis not present

## 2022-06-11 MED ORDER — ENOXAPARIN SODIUM 150 MG/ML IJ SOSY: 40.0000 mg | PREFILLED_SYRINGE | Freq: Once | INTRAMUSCULAR | Status: DC

## 2022-06-11 MED ORDER — ENOXAPARIN SODIUM 150 MG/ML IJ SOSY
40.0000 mg | PREFILLED_SYRINGE | Freq: Once | INTRAMUSCULAR | Status: AC
  Administered 2022-06-11: 39 mg via SUBCUTANEOUS

## 2022-06-11 NOTE — Patient Instructions (Signed)
So great seeing you today.  Your incisions are healing great!   Let us know if you become constipated, develop bruising or have any other concerns.  If you are still sore and cannot go back to work, please let me know before November 27th.  Dr. Melissa Noon

## 2022-06-11 NOTE — Addendum Note (Signed)
Addended by: Henri Medal on: 06/11/2022 04:19 PM   Modules accepted: Orders

## 2022-06-11 NOTE — Progress Notes (Signed)
Patient returned at 4pm to get her enoxaparin injection with the help of Dr. Raymondo Band.  Patient tolerated [place] injection into right arm well.  She states that she is unable or willing to let anyone besides our office administer the last dose.  She is aware of the risks and will wait until Monday to get the next dose which she will bring back with her.  Patient is aware that we are not open over the weekend but that she may call the after hours physician with questions.  Trenise Turay,[name]

## 2022-06-11 NOTE — Assessment & Plan Note (Signed)
Feeling well. Encourage frequent ambulation to prevent DVT or PE. Encourage for any questions regarding complications for her to call her surgeon. Advised her that Tylenol extra strength is fine to take for pain. Left before being able to administer Lovenox injection-we will attempt to get in contact with her later. Return precautions discussed should she develop fever, chills, etc. Work note provided to return on 11/27, if she is still feeling sore she will let me know and I will provide a note for 12/4.

## 2022-06-11 NOTE — Progress Notes (Signed)
    SUBJECTIVE:   CHIEF COMPLAINT / HPI:   Sabrina Brennan is a 31 year-old female here to be seen after surgery. She had a lipo 360 and Brazilian butt lift on 06/08/2022 in [place]. They prescribed her Lovenox to be taken the first 3 days after surgery, she took 1 dose, missed yesterday's dose and is here today with her shots because she is too scared to give them to herself.   She was prescribed Percocet 5-325, but it feels too strong for her. She was taking half of it. She is scared to take Tylenol extra strength from what she has heard from stories online.  Drinking water. Eating well. No constipation, having normal bowel movements.  Taking an antibiotic.  Her surgeon recommended returning to work on December 4, she would like a work note to return on November 27 because of not having enough days to cover.  She said she would resume light duties, not full duties.   PERTINENT  PMH / PSH: Reviewed  OBJECTIVE:   BP 118/74   Pulse (!) 108   Ht 4\' 11"  (1.499 m)   Wt 170 lb (77.1 kg)   SpO2 98%   BMI 34.34 kg/m   General: Well-appearing, no distress CV: Regular rate and rhythm Respiratory: Normal work of breathing on room air, no wheezing or crackles. Skin: Well-healing incisions on lower hips and back.  Has bruising over right thoracic area.   ASSESSMENT/PLAN:   Post-operative state Feeling well. Encourage frequent ambulation to prevent DVT or PE. Encourage for any questions regarding complications for her to call her surgeon. Advised her that Tylenol extra strength is fine to take for pain. Left before being able to administer Lovenox injection-we will attempt to get in contact with her later. Return precautions discussed should she develop fever, chills, etc. Work note provided to return on 11/27, if she is still feeling sore she will let me know and I will provide a note for 12/4.     14/4, DO [facility]

## 2022-06-14 ENCOUNTER — Ambulatory Visit (INDEPENDENT_AMBULATORY_CARE_PROVIDER_SITE_OTHER): Payer: BC Managed Care – PPO

## 2022-06-14 DIAGNOSIS — Z9889 Other specified postprocedural states: Secondary | ICD-10-CM

## 2022-06-14 MED ORDER — ENOXAPARIN SODIUM 150 MG/ML IJ SOSY: 40.0000 mg | PREFILLED_SYRINGE | Freq: Once | INTRAMUSCULAR | Status: DC

## 2022-06-14 MED ORDER — ENOXAPARIN SODIUM 150 MG/ML IJ SOSY: 40.0000 mg | PREFILLED_SYRINGE | Freq: Every day | INTRAMUSCULAR | Status: DC

## 2022-06-14 MED ORDER — ENOXAPARIN SODIUM 40 MG/0.4ML IJ SOSY
40.0000 mg | PREFILLED_SYRINGE | Freq: Once | INTRAMUSCULAR | Status: AC
  Administered 2022-06-14: 40 mg via SUBCUTANEOUS

## 2022-06-14 NOTE — Progress Notes (Signed)
Patient presents to nurse clinic for last Lovenox injection. Administered injection SQ in left arm per Dr. Melissa Noon.   Patient states that she is having post operative soreness. She is going to reach out to Careers adviser. She will follow up with our office with any further concerns.   Sabrina Prude, RN

## 2022-06-24 ENCOUNTER — Encounter: Payer: Self-pay | Admitting: Student

## 2022-06-28 ENCOUNTER — Encounter: Payer: Self-pay | Admitting: Specialist

## 2022-07-01 ENCOUNTER — Telehealth: Payer: BC Managed Care – PPO | Admitting: Family Medicine

## 2022-07-01 ENCOUNTER — Ambulatory Visit: Payer: BC Managed Care – PPO

## 2022-07-01 DIAGNOSIS — J019 Acute sinusitis, unspecified: Secondary | ICD-10-CM | POA: Diagnosis not present

## 2022-07-01 DIAGNOSIS — B9789 Other viral agents as the cause of diseases classified elsewhere: Secondary | ICD-10-CM | POA: Diagnosis not present

## 2022-07-01 MED ORDER — PSEUDOEPH-BROMPHEN-DM 30-2-10 MG/5ML PO SYRP: 5.0000 mL | ORAL_SOLUTION | Freq: Four times a day (QID) | ORAL | 0 refills | Status: DC | PRN

## 2022-07-01 MED ORDER — FLUTICASONE PROPIONATE 50 MCG/ACT NA SUSP: 2.0000 | Freq: Every day | NASAL | 0 refills | Status: DC

## 2022-07-01 MED ORDER — BENZONATATE 100 MG PO CAPS: 100.0000 mg | ORAL_CAPSULE | Freq: Two times a day (BID) | ORAL | 0 refills | Status: DC | PRN

## 2022-07-01 NOTE — Progress Notes (Signed)
Virtual Visit Consent   [name], you are scheduled for a virtual visit with a [facility] provider today. Just as with appointments in the office, your consent must be obtained to participate. Your consent will be active for this visit and any virtual visit you may have with one of our providers in the next 365 days. If you have a MyChart account, a copy of this consent can be sent to you electronically.  As this is a virtual visit, video technology does not allow for your provider to perform a traditional examination. This may limit your provider's ability to fully assess your condition. If your provider identifies any concerns that need to be evaluated in person or the need to arrange testing (such as labs, EKG, etc.), we will make arrangements to do so. Although advances in technology are sophisticated, we cannot ensure that it will always work on either your end or our end. If the connection with a video visit is poor, the visit may have to be switched to a telephone visit. With either a video or telephone visit, we are not always able to ensure that we have a secure connection.  By engaging in this virtual visit, you consent to the provision of healthcare and authorize for your insurance to be billed (if applicable) for the services provided during this visit. Depending on your insurance coverage, you may receive a charge related to this service.  I need to obtain your verbal consent now. Are you willing to proceed with your visit today? Sabrina Brennan has provided verbal consent on 07/01/2022 for a virtual visit (video or telephone). Freddy Finner, NP  Date: 07/01/2022 3:02 PM  Virtual Visit via Video Note   I, Freddy Finner, [name]  ([id number], 1991/06/11) on 07/01/22 at  3:00 PM EST by a video-enabled telemedicine application and verified that I am speaking with the correct person using two identifiers.  Location: Patient: Virtual Visit Location Patient:  Home Provider: Virtual Visit Location Provider: Home Office   I discussed the limitations of evaluation and management by telemedicine and the availability of in person appointments. The patient expressed understanding and agreed to proceed.    History of Present Illness: Sabrina Brennan is a 31 y.o. who identifies as a female who was assigned female at birth, and is being seen today for cough and congestion-nasal mucus is clear. Started two days ago. Sore throat. Denies fevers, chills, chest pain, shortness of breath, ear pain.  Tested for COVID. No recent travel. Was about children at holiday a week ago.  Problems:  Patient Active Problem List   Diagnosis Date Noted   Post-operative state 06/11/2022   Preop examination 05/14/2022   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, not intractable, w stat migr 05/14/2022   Vaginal discharge 11/23/2021   Uses birth control 10/29/2021   Pityriasis versicolor 11/26/2020   Hemiplegic migraine without status migrainosus, not intractable 10/29/2020   History of abnormal cervical Pap smear 08/28/2020   High grade squamous intraepithelial lesion (HGSIL), grade 3 CIN, 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    Allergies: No Known Allergies Medications:  Current Outpatient Medications:    amoxicillin-clavulanate (AUGMENTIN) 875-125 MG tablet, Take 1 tablet by mouth 2 (two) times daily., Disp: 14 tablet, Rfl: 0   hydrOXYzine (VISTARIL) 25 MG capsule, Take 1 capsule (25 mg total) by mouth every 8 (eight) hours as needed., Disp: 15 capsule, Rfl: 0   zolmitriptan (ZOMIG-ZMT) 5 MG disintegrating tablet, Take 1  tablet (5 mg total) by mouth once as needed for up to 1 dose for migraine., Disp: 10 tablet, Rfl: 0  Observations/Objective: Patient is well-developed, well-nourished in no acute distress.  Resting comfortably  at home.  Head is normocephalic, atraumatic.  No labored breathing.  Speech is clear and coherent with logical content.  Patient is alert and oriented at  baseline.    Assessment and Plan:  1. Acute viral sinusitis  - benzonatate (TESSALON) 100 MG capsule; Take 1 capsule (100 mg total) by mouth 2 (two) times daily as needed for cough.  Dispense: 20 capsule; Refill: 0 - fluticasone (FLONASE) 50 MCG/ACT nasal spray; Place 2 sprays into both nostrils daily.  Dispense: 16 g; Refill: 0 - brompheniramine-pseudoephedrine-DM 30-2-10 MG/5ML syrup; Take 5 mLs by mouth 4 (four) times daily as needed.  Dispense: 120 mL; Refill: 0  -Take meds as prescribed -Rest -Use a cool mist humidifier especially during the winter months when heat dries out the air. - Use saline nose sprays frequently to help soothe nasal passages and promote drainage. -Saline irrigations of the nose can be very helpful if done frequently.             * 4X daily for 1 week*             * Use of a nettie pot can be helpful with this.  *Follow directions with this* *Boiled or distilled water only -stay hydrated by drinking plenty of fluids - Keep thermostat turn down low to prevent drying out sinuses - For fever or aches or pains- take tylenol or ibuprofen as directed on bottle             * for fevers greater than 101 orally you may alternate ibuprofen and tylenol every 3 hours.  If you do not improve you will need a follow up visit in person.                  Reviewed side effects, risks and benefits of medication.   Patient acknowledged agreement and understanding of the plan.   Past Medical, Surgical, Social History, Allergies, and Medications have been Reviewed.    Follow Up Instructions: I discussed the assessment and treatment plan with the patient. The patient was provided an opportunity to ask questions and all were answered. The patient agreed with the plan and demonstrated an understanding of the instructions.  A copy of instructions were sent to the patient via MyChart unless otherwise noted below.     The patient was advised to call back or seek an in-person  evaluation if the symptoms worsen or if the condition fails to improve as anticipated.  Time:  I spent 10 minutes with the patient via telehealth technology discussing the above problems/concerns.    Freddy Finner, NP

## 2022-07-01 NOTE — Patient Instructions (Addendum)
Sabrina Brennan, thank you for joining Freddy Finner, NP for today's virtual visit.  While this provider is not your primary care provider (PCP), if your PCP is located in our provider database this encounter information will be shared with them immediately following your visit.   A [facility] MyChart account gives you access to today's visit and all your visits, tests, and labs performed at [facility] " click here if you don't have a [facility] MyChart account or go to [email or web address]  Consent: (Patient) Sabrina Brennan provided verbal consent for this virtual visit at the beginning of the encounter.  Current Medications:  Current Outpatient Medications:    benzonatate (TESSALON) 100 MG capsule, Take 1 capsule (100 mg total) by mouth 2 (two) times daily as needed for cough., Disp: 20 capsule, Rfl: 0   brompheniramine-pseudoephedrine-DM 30-2-10 MG/5ML syrup, Take 5 mLs by mouth 4 (four) times daily as needed., Disp: 120 mL, Rfl: 0   fluticasone (FLONASE) 50 MCG/ACT nasal spray, Place 2 sprays into both nostrils daily., Disp: 16 g, Rfl: 0   amoxicillin-clavulanate (AUGMENTIN) 875-125 MG tablet, Take 1 tablet by mouth 2 (two) times daily., Disp: 14 tablet, Rfl: 0   hydrOXYzine (VISTARIL) 25 MG capsule, Take 1 capsule (25 mg total) by mouth every 8 (eight) hours as needed., Disp: 15 capsule, Rfl: 0   zolmitriptan (ZOMIG-ZMT) 5 MG disintegrating tablet, Take 1 tablet (5 mg total) by mouth once as needed for up to 1 dose for migraine., Disp: 10 tablet, Rfl: 0   Medications ordered in this encounter:  Meds ordered this encounter  Medications   benzonatate (TESSALON) 100 MG capsule    Sig: Take 1 capsule (100 mg total) by mouth 2 (two) times daily as needed for cough.    Dispense:  20 capsule    Refill:  0    Order Specific Question:   Supervising Provider    Answer:   Merrilee Jansky [[id number]]   fluticasone (FLONASE) 50 MCG/ACT nasal spray    Sig: Place 2 sprays  into both nostrils daily.    Dispense:  16 g    Refill:  0    Order Specific Question:   Supervising Provider    Answer:   Merrilee Jansky [id number]   brompheniramine-pseudoephedrine-DM 30-2-10 MG/5ML syrup    Sig: Take 5 mLs by mouth 4 (four) times daily as needed.    Dispense:  120 mL    Refill:  0    Order Specific Question:   Supervising Provider    Answer:   Merrilee Jansky [[id number]]     *If you need refills on other medications prior to your next appointment, please contact your pharmacy*  Follow-Up: Call back or seek an in-person evaluation if the symptoms worsen or if the condition fails to improve as anticipated.  [facility] [phone]  Other Instructions   -Take meds as prescribed -Rest -Flonase spray -Use a cool mist humidifier especially during the winter months when heat dries out the air. - Use saline nose sprays frequently to help soothe nasal passages and promote drainage. -Saline irrigations of the nose can be very helpful if done frequently.             * 4X daily for 1 week*             * Use of a nettie pot can be helpful with this.  *Follow directions with this* *Boiled or distilled water only -stay hydrated by  drinking plenty of fluids - Keep thermostat turn down low to prevent drying out sinuses - For fever or aches or pains- take tylenol or ibuprofen as directed on bottle             * for fevers greater than 101 orally you may alternate ibuprofen and tylenol every 3 hours.  If you do not improve you will need a follow up visit in person.                If you have been instructed to have an in-person evaluation today at a local Urgent Care facility, please use the link below. It will take you to a list of all of our available [facility], including address, phone number and hours of operation. Please do not delay care.  [facility]  If you or a family member do not have a primary care provider, use  the link below to schedule a visit and establish care. When you choose a [facility] primary care physician or advanced practice provider, you gain a long-term partner in health. Find a Primary Care Provider  Learn more about [facility]'s in-office and virtual care options:  - Get Care Now

## 2022-07-08 ENCOUNTER — Encounter: Payer: Self-pay | Admitting: Family Medicine

## 2022-07-08 ENCOUNTER — Ambulatory Visit (INDEPENDENT_AMBULATORY_CARE_PROVIDER_SITE_OTHER): Payer: BC Managed Care – PPO | Admitting: Family Medicine

## 2022-07-08 VITALS — BP 114/88 | HR 95 | Ht 59.0 in | Wt 165.8 lb

## 2022-07-08 DIAGNOSIS — M546 Pain in thoracic spine: Secondary | ICD-10-CM | POA: Diagnosis not present

## 2022-07-08 DIAGNOSIS — Z3042 Encounter for surveillance of injectable contraceptive: Secondary | ICD-10-CM | POA: Diagnosis not present

## 2022-07-08 MED ORDER — BACLOFEN 10 MG PO TABS: 5.0000 mg | ORAL_TABLET | Freq: Three times a day (TID) | ORAL | 0 refills | Status: DC

## 2022-07-08 MED ORDER — MEDROXYPROGESTERONE ACETATE 150 MG/ML IM SUSP
150.0000 mg | Freq: Once | INTRAMUSCULAR | Status: AC
  Administered 2022-07-08: 150 mg via INTRAMUSCULAR

## 2022-07-08 NOTE — Patient Instructions (Addendum)
I recommend getting the over the counter Lidocaine 4% patches on the area on her back.  You can use other topicals such as IcyHot or the other patches that may include things like menthol.  I would plan for the next 3 days to go ahead and schedule Tylenol and ibuprofen to try and get on top of some of this pain.  We will try a different muscle relaxer that may not cause as much sedation called baclofen.  I can give you the prescription for the 10 mg but I recommend starting with half the dose at 5 mg and seeing if that helps you.

## 2022-07-08 NOTE — Progress Notes (Signed)
    SUBJECTIVE:   CHIEF COMPLAINT / HPI:   Severe pain - Had liposuction 2nd round beginning of Nov in [place] - Had skin taken from her stomach and placed in her hips - Was given perkoset and robaxin (had constipation, couldn't work on [employer])  - Called her Careers adviser for some of the burning sensation she is having but was told that was normal - Has fluid in her side that is supposed to get better once she gets her custom garment   Depo - Is due for her injection today   PERTINENT  PMH / PSH: Reviewed  OBJECTIVE:   BP 114/88   Pulse 95   Ht 4\' 11"  (1.499 m)   Wt 165 lb 12.8 oz (75.2 kg)   SpO2 100%   BMI 33.49 kg/m   General: NAD, well-appearing, well-nourished Respiratory: No respiratory distress, breathing comfortably, able to speak in full sentences Skin: warm and dry, no rashes noted on exposed skin Psych: Appropriate affect and mood MSK: mid-thoracic and lumbar paraspinal muscles TTP, no obvious deformity. Noted to have scars from recent liposuction procedures.    ASSESSMENT/PLAN:   Back pain Secondary to recent procedures for liposuction.  Patient is unable to take the painkillers and muscle relaxers were previously prescribed due to sedation and she works as a .  Per explanation from patient's surgeon, symptoms patient is experiencing are expected.  She gets her garment for her seroma treatment and pain in tomorrow. - Trial of baclofen 5 mg 3 times daily as needed - Use at night at first to see how she tolerates this - Return and ER precautions given  Depo - Injection done at this visit   Runner, broadcasting/film/video, DO [facility]

## 2022-07-20 ENCOUNTER — Telehealth: Payer: BC Managed Care – PPO | Admitting: Physician Assistant

## 2022-07-20 DIAGNOSIS — A084 Viral intestinal infection, unspecified: Secondary | ICD-10-CM

## 2022-07-20 MED ORDER — ONDANSETRON 4 MG PO TBDP: 4.0000 mg | ORAL_TABLET | Freq: Three times a day (TID) | ORAL | 0 refills | Status: DC | PRN

## 2022-07-20 NOTE — Progress Notes (Signed)
Virtual Visit Consent   [name], you are scheduled for a virtual visit with a  provider today. Just as with appointments in the office, your consent must be obtained to participate. Your consent will be active for this visit and any virtual visit you may have with one of our providers in the next 365 days. If you have a MyChart account, a copy of this consent can be sent to you electronically.  As this is a virtual visit, video technology does not allow for your provider to perform a traditional examination. This may limit your provider's ability to fully assess your condition. If your provider identifies any concerns that need to be evaluated in person or the need to arrange testing (such as labs, EKG, etc.), we will make arrangements to do so. Although advances in technology are sophisticated, we cannot ensure that it will always work on either your end or our end. If the connection with a video visit is poor, the visit may have to be switched to a telephone visit. With either a video or telephone visit, we are not always able to ensure that we have a secure connection.  By engaging in this virtual visit, you consent to the provision of healthcare and authorize for your insurance to be billed (if applicable) for the services provided during this visit. Depending on your insurance coverage, you may receive a charge related to this service.  I need to obtain your verbal consent now. Are you willing to proceed with your visit today? Sabrina Brennan has provided verbal consent on 07/20/2022 for a virtual visit (video or telephone). [name], [place]  Date: 07/20/2022 5:22 PM  Virtual Visit via Video Note   I, [name], [name]  ([id number], 22-Feb-1991) on 07/20/22 at  5:15 PM EST by a video-enabled telemedicine application and verified that I am speaking with the correct person using two identifiers.  Location: Patient: Virtual Visit  Location Patient: Home Provider: Virtual Visit Location Provider: Home Office   I discussed the limitations of evaluation and management by telemedicine and the availability of in person appointments. The patient expressed understanding and agreed to proceed.    History of Present Illness: Sabrina Brennan is a 31 y.o. who identifies as a female who was assigned female at birth, and is being seen today for 1.5 days of cramping, nausea and vomiting. Children with similar symptoms recently. Notes some diarrhea without melena, hematochezia or tenesmus. Is trying to stay hydrated.  HPI: HPI  Problems:  Patient Active Problem List   Diagnosis Date Noted   Post-operative state 06/11/2022   Preop examination 05/14/2022   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, not intractable, w stat migr 05/14/2022   Vaginal discharge 11/23/2021   Uses birth control 10/29/2021   Pityriasis versicolor 11/26/2020   Hemiplegic migraine without status migrainosus, not intractable 10/29/2020   History of abnormal cervical Pap smear 08/28/2020   High grade squamous intraepithelial lesion (HGSIL), grade 3 CIN, 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    Allergies: No Known Allergies Medications:  Current Outpatient Medications:    ondansetron (ZOFRAN-ODT) 4 MG disintegrating tablet, Take 1 tablet (4 mg total) by mouth every 8 (eight) hours as needed for nausea or vomiting., Disp: 20 tablet, Rfl: 0   baclofen (LIORESAL) 10 MG tablet, Take 0.5 tablets (5 mg total) by mouth 3 (three) times daily., Disp: 15 each, Rfl: 0   zolmitriptan (ZOMIG-ZMT) 5 MG disintegrating tablet, Take 1 tablet (  5 mg total) by mouth once as needed for up to 1 dose for migraine., Disp: 10 tablet, Rfl: 0  Observations/Objective: Patient is well-developed, well-nourished in no acute distress.  Resting comfortably at home.  Head is normocephalic, atraumatic.  No labored breathing. Speech is clear and coherent with logical content.  Patient is alert and oriented at  baseline.   Assessment and Plan: 1. Viral gastroenteritis - ondansetron (ZOFRAN-ODT) 4 MG disintegrating tablet; Take 1 tablet (4 mg total) by mouth every 8 (eight) hours as needed for nausea or vomiting.  Dispense: 20 tablet; Refill: 0  Supportive measures and OTC medications reviewed. Start [employer] Zofran per orders. Strict ER precautions discussed. Work note provided.   Follow Up Instructions: I discussed the assessment and treatment plan with the patient. The patient was provided an opportunity to ask questions and all were answered. The patient agreed with the plan and demonstrated an understanding of the instructions.  A copy of instructions were sent to the patient via MyChart unless otherwise noted below.   The patient was advised to call back or seek an in-person evaluation if the symptoms worsen or if the condition fails to improve as anticipated.  Time:  I spent 10 minutes with the patient via telehealth technology discussing the above problems/concerns.    [name], PA-C

## 2022-07-20 NOTE — Patient Instructions (Signed)
  Sabrina Brennan, thank you for joining [name], PA-C for today's virtual visit.  While this provider is not your primary care provider (PCP), if your PCP is located in our provider database this encounter information will be shared with them immediately following your visit.   A [facility] MyChart account gives you access to today's visit and all your visits, tests, and labs performed at [facility] " click here if you don't have a [facility] MyChart account or go to [email or web address]  Consent: (Patient) Sabrina Brennan provided verbal consent for this virtual visit at the beginning of the encounter.  Current Medications:  Current Outpatient Medications:    ondansetron (ZOFRAN-ODT) 4 MG disintegrating tablet, Take 1 tablet (4 mg total) by mouth every 8 (eight) hours as needed for nausea or vomiting., Disp: 20 tablet, Rfl: 0   baclofen (LIORESAL) 10 MG tablet, Take 0.5 tablets (5 mg total) by mouth 3 (three) times daily., Disp: 15 each, Rfl: 0   zolmitriptan (ZOMIG-ZMT) 5 MG disintegrating tablet, Take 1 tablet (5 mg total) by mouth once as needed for up to 1 dose for migraine., Disp: 10 tablet, Rfl: 0   Medications ordered in this encounter:  Meds ordered this encounter  Medications   ondansetron (ZOFRAN-ODT) 4 MG disintegrating tablet    Sig: Take 1 tablet (4 mg total) by mouth every 8 (eight) hours as needed for nausea or vomiting.    Dispense:  20 tablet    Refill:  0    Order Specific Question:   Supervising Provider    Answer:   Merrilee Jansky [id number]     *If you need refills on other medications prior to your next appointment, please contact your pharmacy*  Follow-Up: Call back or seek an in-person evaluation if the symptoms worsen or if the condition fails to improve as anticipated.  [facility] [phone]  Other Instructions Please keep well-hydrated and get plenty of rest. Follow the dietary recommendations I have  attached. You can use Imodium over-the-counter to help slow down the frequency of stools. Start a daily probiotic. Use the Zofran as directed for nausea and vomiting. If symptoms continue to worsen despite treatment, you need to seek an in person evaluation ASAP.  Please do not delay care.   If you have been instructed to have an in-person evaluation today at a local Urgent Care facility, please use the link below. It will take you to a list of all of our available [facility], including address, phone number and hours of operation. Please do not delay care.  [facility]  If you or a family member do not have a primary care provider, use the link below to schedule a visit and establish care. When you choose a [facility] primary care physician or advanced practice provider, you gain a long-term partner in health. Find a Primary Care Provider  Learn more about [facility]'s in-office and virtual care options:  - Get Care Now

## 2022-08-12 ENCOUNTER — Telehealth: Payer: BC Managed Care – PPO | Admitting: Family Medicine

## 2022-08-12 DIAGNOSIS — G43901 Migraine, unspecified, not intractable, with status migrainosus: Secondary | ICD-10-CM

## 2022-08-12 MED ORDER — ZOLMITRIPTAN 5 MG PO TBDP: 5.0000 mg | ORAL_TABLET | Freq: Once | ORAL | 0 refills | Status: DC | PRN

## 2022-08-12 NOTE — Patient Instructions (Addendum)
Sabrina Brennan, thank you for joining Perlie Mayo, NP for today's virtual visit.  While this provider is not your primary care provider (PCP), if your PCP is located in our provider database this encounter information will be shared with them immediately following your visit.   [place] account gives you access to today's visit and all your visits, tests, and labs performed at [facility] " click here if you don't have a [facility] account or go to [email or web address]  Consent: (Patient) Sabrina Brennan provided verbal consent for this virtual visit at the beginning of the encounter.  Current Medications:  Current Outpatient Medications:    baclofen (LIORESAL) 10 MG tablet, Take 0.5 tablets (5 mg total) by mouth 3 (three) times daily., Disp: 15 each, Rfl: 0   ondansetron (ZOFRAN-ODT) 4 MG disintegrating tablet, Take 1 tablet (4 mg total) by mouth every 8 (eight) hours as needed for nausea or vomiting., Disp: 20 tablet, Rfl: 0   zolmitriptan (ZOMIG-ZMT) 5 MG disintegrating tablet, Take 1 tablet (5 mg total) by mouth once as needed for up to 1 dose for migraine., Disp: 10 tablet, Rfl: 0   Medications ordered in this encounter:  Meds ordered this encounter  Medications   zolmitriptan (ZOMIG-ZMT) 5 MG disintegrating tablet    Sig: Take 1 tablet (5 mg total) by mouth once as needed for up to 1 dose for migraine.    Dispense:  10 tablet    Refill:  0    D/C PREVIOUS SCRIPTS FOR THIS MEDICATION    Order Specific Question:   Supervising Provider    Answer:   Chase Picket [[id number]]     *If you need refills on other medications prior to your next appointment, please contact your pharmacy*  Follow-Up: Call back or seek an in-person evaluation if the symptoms worsen or if the condition fails to improve as anticipated.  [place] [phone]  Other Instructions  Migraine Headache A migraine headache is a very strong throbbing  pain on one side or both sides of your head. This type of headache can also cause other symptoms. It can last from 4 hours to 3 days. Talk with your doctor about what things may bring on (trigger) this condition. What are the causes? The exact cause of this condition is not known. This condition may be triggered or caused by: Drinking alcohol. Smoking. Taking medicines, such as: Medicine used to treat chest pain (nitroglycerin). Birth control pills. Estrogen. Some blood pressure medicines. Eating or drinking certain products. Doing physical activity. Other things that may trigger a migraine headache include: Having a menstrual period. Pregnancy. Hunger. Stress. Not getting enough sleep or getting too much sleep. Weather changes. Tiredness (fatigue). What increases the risk? Being 14-60 years old. Being female. Having a family history of migraine headaches. Being Caucasian. Having depression or anxiety. Being very overweight. What are the signs or symptoms? A throbbing pain. This pain may: Happen in any area of the head, such as on one side or both sides. Make it hard to do daily activities. Get worse with physical activity. Get worse around bright lights or loud noises. Other symptoms may include: Feeling sick to your stomach (nauseous). Vomiting. Dizziness. Being sensitive to bright lights, loud noises, or smells. Before you get a migraine headache, you may get warning signs (an aura). An aura may include: Seeing flashing lights or having blind spots. Seeing bright spots, halos, or zigzag lines. Having tunnel vision or  blurred vision. Having numbness or a tingling feeling. Having trouble talking. Having weak muscles. Some people have symptoms after a migraine headache (postdromal phase), such as: Tiredness. Trouble thinking (concentrating). How is this treated? Taking medicines that: Relieve pain. Relieve the feeling of being sick to your stomach. Prevent migraine  headaches. Treatment may also include: Having acupuncture. Avoiding foods that bring on migraine headaches. Learning ways to control your body functions (biofeedback). Therapy to help you know and deal with negative thoughts (cognitive behavioral therapy). Follow these instructions at home: Medicines Take over-the-counter and prescription medicines only as told by your doctor. Ask your doctor if the medicine prescribed to you: Requires you to avoid driving or using heavy machinery. Can cause trouble pooping (constipation). You may need to take these steps to prevent or treat trouble pooping: Drink enough fluid to keep your pee (urine) pale yellow. Take over-the-counter or prescription medicines. Eat foods that are high in fiber. These include beans, whole grains, and fresh fruits and vegetables. Limit foods that are high in fat and sugar. These include fried or sweet foods. Lifestyle Do not drink alcohol. Do not use any products that contain nicotine or tobacco, such as cigarettes, e-cigarettes, and chewing tobacco. If you need help quitting, ask your doctor. Get at least 8 hours of sleep every night. Limit and deal with stress. General instructions Keep a journal to find out what may bring on your migraine headaches. For example, write down: What you eat and drink. How much sleep you get. Any change in what you eat or drink. Any change in your medicines. If you have a migraine headache: Avoid things that make your symptoms worse, such as bright lights. It may help to lie down in a dark, quiet room. Do not drive or use heavy machinery. Ask your doctor what activities are safe for you. Keep all follow-up visits as told by your doctor. This is important. Contact a doctor if: You get a migraine headache that is different or worse than others you have had. You have more than 15 headache days in one month. Get help right away if: Your migraine headache gets very bad. Your migraine  headache lasts longer than 72 hours. You have a fever. You have a stiff neck. You have trouble seeing. Your muscles feel weak or like you cannot control them. You start to lose your balance a lot. You start to have trouble walking. You pass out (faint). You have a seizure. Summary A migraine headache is a very strong throbbing pain on one side or both sides of your head. These headaches can also cause other symptoms. This condition may be treated with medicines and changes to your lifestyle. Keep a journal to find out what may bring on your migraine headaches. Contact a doctor if you get a migraine headache that is different or worse than others you have had. Contact your doctor if you have more than 15 headache days in a month. This information is not intended to replace advice given to you by your health care provider. Make sure you discuss any questions you have with your health care provider. Document Revised: 12/31/2021 Document Reviewed: 08/31/2018 Elsevier Patient Education  [place].    If you have been instructed to have an in-person evaluation today at a local Urgent Care facility, please use the link below. It will take you to a list of all of our available [facility], including address, phone number and hours of operation. Please do not  delay care.  [facility]  If you or a family member do not have a primary care provider, use the link below to schedule a visit and establish care. When you choose a [facility] primary care physician or advanced practice provider, you gain a long-term partner in health. Find a Primary Care Provider  Learn more about [facility]'s in-office and virtual care options: [facility]

## 2022-08-12 NOTE — Progress Notes (Signed)
Virtual Visit Consent   Sabrina Brennan, you are scheduled for a virtual visit with a [facility] provider today. Just as with appointments in the office, your consent must be obtained to participate. Your consent will be active for this visit and any virtual visit you may have with one of our providers in the next 365 days. If you have a MyChart account, a copy of this consent can be sent to you electronically.  As this is a virtual visit, video technology does not allow for your provider to perform a traditional examination. This may limit your provider's ability to fully assess your condition. If your provider identifies any concerns that need to be evaluated in person or the need to arrange testing (such as labs, EKG, etc.), we will make arrangements to do so. Although advances in technology are sophisticated, we cannot ensure that it will always work on either your end or our end. If the connection with a video visit is poor, the visit may have to be switched to a telephone visit. With either a video or telephone visit, we are not always able to ensure that we have a secure connection.  By engaging in this virtual visit, you consent to the provision of healthcare and authorize for your insurance to be billed (if applicable) for the services provided during this visit. Depending on your insurance coverage, you may receive a charge related to this service.  I need to obtain your verbal consent now. Are you willing to proceed with your visit today? JERRY HAUGEN has provided verbal consent on 08/12/2022 for a virtual visit (video or telephone). Perlie Mayo, NP  Date: 08/12/2022 9:58 AM  Virtual Visit via Video Note   I, Perlie Mayo, [name]  ([id number], [birth year]) on 08/12/22 at 10:00 AM EST by a video-enabled telemedicine application and verified that I am speaking with the correct person using two identifiers.  Location: Patient: Virtual Visit Location Patient:  Home Provider: Virtual Visit Location Provider: Home Office   I discussed the limitations of evaluation and management by telemedicine and the availability of in person appointments. The patient expressed understanding and agreed to proceed.    History of Present Illness: Sabrina Brennan is a 32 y.o. who identifies as a female who was assigned female at birth, and is being seen today for migraines.  HPI: Migraine  This is a new problem. The current episode started yesterday (Two days ago- out of medication). The problem occurs constantly. The problem has been gradually worsening. The pain is located in the Occipital and parietal region. The pain does not radiate. The pain quality is similar to prior headaches. The quality of the pain is described as throbbing. The pain is at a severity of 8/10. The pain is moderate. Associated symptoms include nausea, phonophobia and photophobia. Pertinent negatives include no abdominal pain, abnormal behavior, anorexia, back pain, blurred vision, coughing, drainage, ear pain, eye pain, eye redness, eye watering, facial sweating, fever, hearing loss, insomnia, loss of balance, muscle aches, neck pain, numbness, rhinorrhea, scalp tenderness, seizures, sinus pressure, sore throat, swollen glands, tingling, tinnitus, visual change, vomiting, weakness or weight loss. The symptoms are aggravated by bright light and noise. She has tried Excedrin for the symptoms. The treatment provided no relief.    Problems:  Patient Active Problem List   Diagnosis Date Noted   Post-operative state 06/11/2022   Preop examination 05/14/2022   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, not intractable, w stat  migr 05/14/2022   Vaginal discharge 11/23/2021   Uses birth control 10/29/2021   Pityriasis versicolor 11/26/2020   Hemiplegic migraine without status migrainosus, not intractable 10/29/2020   History of abnormal cervical Pap smear 08/28/2020   High grade squamous intraepithelial lesion (HGSIL),  grade 3 CIN, 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    Allergies: No Known Allergies Medications:  Current Outpatient Medications:    baclofen (LIORESAL) 10 MG tablet, Take 0.5 tablets (5 mg total) by mouth 3 (three) times daily., Disp: 15 each, Rfl: 0   ondansetron (ZOFRAN-ODT) 4 MG disintegrating tablet, Take 1 tablet (4 mg total) by mouth every 8 (eight) hours as needed for nausea or vomiting., Disp: 20 tablet, Rfl: 0   zolmitriptan (ZOMIG-ZMT) 5 MG disintegrating tablet, Take 1 tablet (5 mg total) by mouth once as needed for up to 1 dose for migraine., Disp: 10 tablet, Rfl: 0  Observations/Objective: Patient is well-developed, well-nourished in no acute distress.  Resting comfortably  at home.  Head is normocephalic, atraumatic.  No labored breathing.  Speech is clear and coherent with logical content.  Patient is alert and oriented at baseline.    Assessment and Plan:  1. Migraine with status migrainosus, not intractable, unspecified migraine type  - zolmitriptan (ZOMIG-ZMT) 5 MG disintegrating tablet; Take 1 tablet (5 mg total) by mouth once as needed for up to 1 dose for migraine.  Dispense: 10 tablet; Refill: 0  Work note provided Might benefit from daily med  Supportive measures and OTC medications reviewed. Rx Zomig SL to use as directed for abortive therapy. Keep symptom journal. Contact PCP and Neurologist for follow-up.     Reviewed side effects, risks and benefits of medication.    Patient acknowledged agreement and understanding of the plan.   Past Medical, Surgical, Social History, Allergies, and Medications have been Reviewed.   Follow Up Instructions: I discussed the assessment and treatment plan with the patient. The patient was provided an opportunity to ask questions and all were answered. The patient agreed with the plan and demonstrated an understanding of the instructions.  A copy of instructions were sent to the patient via MyChart unless otherwise noted below.     The patient was advised to call back or seek an in-person evaluation if the symptoms worsen or if the condition fails to improve as anticipated.  Time:  I spent 10 minutes with the patient via telehealth technology discussing the above problems/concerns.    Perlie Mayo, NP

## 2022-08-18 ENCOUNTER — Encounter: Payer: Self-pay | Admitting: Student

## 2022-08-19 ENCOUNTER — Other Ambulatory Visit: Payer: Self-pay | Admitting: Student

## 2022-08-19 MED ORDER — SCOPOLAMINE 1 MG/3DAYS TD PT72: 1.0000 | MEDICATED_PATCH | TRANSDERMAL | 0 refills | Status: DC

## 2022-08-24 ENCOUNTER — Telehealth: Payer: BC Managed Care – PPO | Admitting: Family Medicine

## 2022-08-25 NOTE — Progress Notes (Signed)
The patient no-showed for appointment despite this provider sending direct link, reaching out via phone with no response and waiting for at least 10 minutes from appointment time for patient to join. They will be marked as a NS for this appointment/time.   Sabrina Dorfman M Stewart Pimenta, NP    

## 2022-09-20 ENCOUNTER — Ambulatory Visit
Admission: EM | Admit: 2022-09-20 | Discharge: 2022-09-20 | Disposition: A | Discharge status: Home / Self Care | Payer: BC Managed Care – PPO

## 2022-09-20 DIAGNOSIS — R197 Diarrhea, unspecified: Secondary | ICD-10-CM

## 2022-09-20 NOTE — ED Triage Notes (Addendum)
Pt c/o lower back pain, abd pain with diarrhea that is activated after eating.   Started: Friday   Home interventions: hydration

## 2022-09-20 NOTE — ED Provider Notes (Signed)
[facility]    CSN: [id number] Arrival date & time: 09/20/22  1016      History   Chief Complaint Chief Complaint  Patient presents with   Abdominal Pain   Diarrhea    HPI Sabrina Brennan is a 32 y.o. female presents for evaluation of diarrhea.  Patient reports greater than 1 year she has had intermittent spells of abdominal pain with diarrhea that occurs after eating.  She has been unable to find any correlation between what she eats and these episodes.  She has seen her PCP for this and was referred to gastroenterology last year but has not heard anything additional to this.  She did contact her PCP and they stated they would need to put in a new referral.  She still awaiting contact.  She denies any fevers or unexplained weight loss.  Some nausea but no vomiting.  Denies history of IBS, Crohn's, diverticulitis.  No history of abdominal surgeries.  Reports a family history of hernias.  Denies any known food allergies but she has not had any food allergy testing.  She is able to stay hydrated.  No dysuria.  No other concerns at this time.   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   History of abnormal cervical Pap smear 08/28/2020   High grade squamous intraepithelial lesion (HGSIL), grade 3 CIN, 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    Past Surgical History:  Procedure Laterality Date   EXCISION MASS LOWER EXTREMETIES Left 12/11/2019   Procedure: EXCISION LEFT THIGH  CYST;  Surgeon: Erroll Luna, MD;  Location: [facility];  Service: General;  Laterality: Left;   LIPOSUCTION MULTIPLE BODY PARTS      OB History     Gravida  1   Para  0   Term      Preterm      AB  1   Living         SAB      IAB  1   Ectopic      Multiple      Live Births           Obstetric Comments  1st Menstrual Cycle:  14          Home Medications    Prior to Admission medications   Medication Sig Start Date End Date Taking? Authorizing Provider  scopolamine (TRANSDERM-SCOP) 1 MG/3DAYS Place 1 patch (1.5 mg total) onto the skin every 3 (three) days. 08/19/22   Dameron, Luna Fuse, DO  baclofen (LIORESAL) 10 MG tablet Take 0.5 tablets (5 mg total) by mouth 3 (three) times daily. 07/08/22   Lilland, Alana, DO  ondansetron (ZOFRAN-ODT) 4 MG disintegrating tablet Take 1 tablet (4 mg total) by mouth every 8 (eight) hours as needed for nausea or vomiting. 07/20/22   Brunetta Jeans, PA-C  zolmitriptan (ZOMIG-ZMT) 5 MG disintegrating tablet Take 1 tablet (5 mg total) by mouth once as needed for up to 1 dose for migraine. 08/12/22  Perlie Mayo, NP    Family History Family History  Problem Relation Age of Onset   Healthy Mother    Healthy Father    Breast cancer Maternal Grandmother    Hypertension Maternal Grandmother    Pancreatic cancer Maternal Grandfather    Migraines Maternal Grandfather    Breast cancer Other        Maternal aunt    Social History Social History   Tobacco Use   Smoking status: Light Smoker    Types: Cigarettes    Passive exposure: Current   Smokeless tobacco: Never   Tobacco comments:    Hooka 2x monthly  Vaping Use   Vaping Use: Never used  Substance Use Topics   Alcohol use: Not Currently    Comment: occ   Drug use: No     Allergies   Patient has no known allergies.   Review of Systems Review of Systems  Gastrointestinal:  Positive for abdominal pain and diarrhea.     Physical  Exam Triage Vital Signs ED Triage Vitals  Enc Vitals Group     BP 09/20/22 1036 121/83     Pulse Rate 09/20/22 1036 71     Resp 09/20/22 1036 20     Temp 09/20/22 1036 98.2 F (36.8 C)     Temp Source 09/20/22 1036 Oral     SpO2 09/20/22 1036 96 %     Weight --      Height --      Head Circumference --      Peak Flow --      Pain Score 09/20/22 1038 8     Pain Loc --      Pain Edu? --      Excl. in [place]? --    No data found.  Updated Vital Signs BP 121/83 (BP Location: Right Arm)   Pulse 71   Temp 98.2 F (36.8 C) (Oral)   Resp 20   SpO2 96%   Visual Acuity Right Eye Distance:   Left Eye Distance:   Bilateral Distance:    Right Eye Near:   Left Eye Near:    Bilateral Near:     Physical Exam Vitals and nursing note reviewed.  Constitutional:      General: She is not in acute distress.    Appearance: She is well-developed. She is not ill-appearing.  HENT:     Head: Normocephalic and atraumatic.  Eyes:     Pupils: Pupils are equal, round, and reactive to light.  Cardiovascular:     Rate and Rhythm: Normal rate.  Pulmonary:     Effort: Pulmonary effort is normal.  Abdominal:     General: Abdomen is flat. Bowel sounds are normal.     Palpations: Abdomen is soft.     Tenderness: There is generalized abdominal tenderness. Negative signs include Rovsing's sign and McBurney's sign.  Skin:    General: Skin is warm and dry.  Neurological:     General: No focal deficit present.     Mental Status: She is alert and oriented to person, place, and time.  Psychiatric:        Mood and Affect: Mood normal.        Behavior: Behavior normal.      UC Treatments / Results  Labs (all labs ordered are listed, but only abnormal results are displayed) Labs Reviewed - No data to display  EKG   Radiology No results found.  Procedures Procedures (including critical care time)  Medications Ordered in UC Medications - No data to display  Initial Impression /  Assessment and Plan / UC Course  I have reviewed the triage vital signs and the nursing notes.  Pertinent labs & imaging results that were available during my care of the patient were reviewed by me and considered in my medical decision making (see chart for details).     [place] exam and ongoing symptoms with patient.  No red flags on exam.  Advised she will need to follow-up with gastroenterology for further workup of these chronic symptoms.  Instructed to follow-up with her PCP regarding the referral.  She can try to contact [facility] gastroenterology to see if they have any appointments available.  In the meantime advise she keep a food diary to take to GI.  Discussed cutting out food groups such as gluten or dairy to see if symptoms improve Remain hydrated Strict ER precautions reviewed and patient verbalized understanding Final Clinical Impressions(s) / UC Diagnoses   Final diagnoses:  Diarrhea, unspecified type     Discharge Instructions      Keep a food diary to take to gastroenterology Follow-up with your PCP regarding your referral to gastroenterology.  You may try to contact [facility] gastroenterology to see if they have any availability And diet and advance as tolerated Continue hydration with Gatorade, Powerade, Pedialyte, water Please go to the emergency room if you have any worsening symptoms   ED Prescriptions   None    PDMP not reviewed this encounter.   Melynda Ripple, NP 09/20/22 1102

## 2022-09-20 NOTE — Discharge Instructions (Signed)
Keep a food diary to take to gastroenterology Follow-up with your PCP regarding your referral to gastroenterology.  You may try to contact [facility] gastroenterology to see if they have any availability And diet and advance as tolerated Continue hydration with Gatorade, Powerade, Pedialyte, water Please go to the emergency room if you have any worsening symptoms

## 2022-09-20 NOTE — ED Notes (Signed)
Patient on phone with insurance company

## 2022-09-24 ENCOUNTER — Ambulatory Visit (INDEPENDENT_AMBULATORY_CARE_PROVIDER_SITE_OTHER): Payer: BC Managed Care – PPO | Admitting: Student

## 2022-09-24 ENCOUNTER — Encounter: Payer: Self-pay | Admitting: Student

## 2022-09-24 VITALS — BP 116/68 | HR 82 | Wt 164.6 lb

## 2022-09-24 DIAGNOSIS — Z3042 Encounter for surveillance of injectable contraceptive: Secondary | ICD-10-CM | POA: Diagnosis not present

## 2022-09-24 DIAGNOSIS — R197 Diarrhea, unspecified: Secondary | ICD-10-CM | POA: Insufficient documentation

## 2022-09-24 HISTORY — DX: Diarrhea, unspecified: R19.7

## 2022-09-24 MED ORDER — HYDROCORTISONE 1 % EX OINT: 1.0000 | TOPICAL_OINTMENT | Freq: Two times a day (BID) | CUTANEOUS | 0 refills | Status: DC

## 2022-09-24 MED ORDER — LOPERAMIDE HCL 2 MG PO CAPS: 4.0000 mg | ORAL_CAPSULE | ORAL | 0 refills | Status: DC | PRN

## 2022-09-24 MED ORDER — MEDROXYPROGESTERONE ACETATE 150 MG/ML IM SUSP
150.0000 mg | Freq: Once | INTRAMUSCULAR | Status: AC
  Administered 2022-09-24: 150 mg via INTRAMUSCULAR

## 2022-09-24 NOTE — Assessment & Plan Note (Signed)
Chronic-has been ongoing for over a year.   She is well-appearing and well-hydrated on exam.  Abdominal exam is benign. Encouraged her to avoid food triggers including fatty/fast foods, sugary foods, excessive alcohol use. Prescribed loperamide to be used as needed for diarrhea. Not concern today for infectious cause. Likely functional- consider IBS-D.  Has upcoming appointment with you will GI-she may have workup at that time for celiac disease, other potential causes.

## 2022-09-24 NOTE — Progress Notes (Signed)
Patient here today for Depo Provera injection and is within her dates.    Last contraceptive appt was 07/08/2022  Depo given in [place] today.  Site unremarkable & patient tolerated injection.    Next injection due 05/11/-12-25-2022.  Reminder card given.    Ottis Stain, [name]

## 2022-09-24 NOTE — Patient Instructions (Addendum)
It was great seeing you today.  For your abdominal pain, please go to your [facility] GI appointment in April. I do not think it is anything serious If you get fever or chills, or your worsening pain please seek more immediate care Take Loperamide when you have diarrhea as instructed   If you have any questions or concerns, please feel free to call the clinic.   Have a wonderful day,  Dr. Orvis Brill [facility] [phone]

## 2022-09-24 NOTE — Progress Notes (Signed)
    SUBJECTIVE:   CHIEF COMPLAINT / HPI:   Sabrina Brennan is a 32 year-old female here with abdominal pain and to receive Depo injection.  Abdominal pain Chronic. Was seen at Urgent Care on 09/20/2022 for abdominal pain Has appointment with [facility] April 16th Has diarrhea 75% of the time when she has bowel movements Had Chick fil a this morning and her stomach started hurting Not drinking as much alcohol No fevers or chills.  Contraceptive management On Depo.  No issues.  She is within her dates to get her next injection LMP: 08/09/2022 Sexual activity: Yes.  Reports always using protection  Healthcare maintenance Last Pap ASCUS on 10/29/2021; prior pap in 2021 with high-risk HPV but normal cytology.  PERTINENT  PMH / PSH: Reviewed  OBJECTIVE:   BP 116/68   Pulse 82   Wt 164 lb 9.6 oz (74.7 kg)   SpO2 98%   BMI 33.25 kg/m   General: Alert and cooperative and appears to be in no acute distress Cardio: Normal S1 and S2, no S3 or S4. Rhythm is regular. No murmurs or rubs.   Pulm: Clear to auscultation bilaterally, no crackles, wheezing, or diminished breath sounds. Normal respiratory effort Abdomen: Bowel sounds normal. Abdomen soft and non-tender.  Well-healed surgical incision scars. Extremities: No peripheral edema. Warm/ well perfused.  Strong radial pulses. Neuro: Cranial nerves grossly intact  ASSESSMENT/PLAN:   Diarrhea Chronic-has been ongoing for over a year.   She is well-appearing and well-hydrated on exam.  Abdominal exam is benign. Encouraged her to avoid food triggers including fatty/fast foods, sugary foods, excessive alcohol use. Prescribed loperamide to be used as needed for diarrhea. Not concern today for infectious cause. Likely functional- consider IBS-D.  Has upcoming appointment with you will GI-she may have workup at that time for celiac disease, other potential causes.   Received Depo today.  See [name] note.  Appointment scheduled next month for repeat  Pap.  Per ASCCP Guidelines, she needs 1 year repeat Pap due to prior high risk HPV and ASCUS on most recent Pap smear.  Orvis Brill, [place]

## 2022-10-11 NOTE — Progress Notes (Unsigned)
    SUBJECTIVE:   CHIEF COMPLAINT / HPI:   Sabrina Brennan is a 32 year-old female here for pap smear. Denies any concerns today specifically.  In 2021, HGSIL with subsequent LEEP. Pap cytology in 2021 high-risk HPV but negative 16/18/45., and cytology in 2023 with ASCUS. With these prior pap results, per ASCCP guidelines recommendation is for 1 year follow-up (2024) and HPV based-screening as 5 year risk of CIN3 is 2.6%.  LMP: Sexual activity: Contraception:  PERTINENT  PMH / PSH: ***  OBJECTIVE:   There were no vitals taken for this visit. ***   ASSESSMENT/PLAN:   No problem-specific Assessment & Plan notes found for this encounter.     Orvis Brill, [place]    {    This will disappear when note is signed, click to select method of visit    :1}

## 2022-10-12 ENCOUNTER — Encounter: Payer: Self-pay | Admitting: Student

## 2022-10-12 ENCOUNTER — Ambulatory Visit (INDEPENDENT_AMBULATORY_CARE_PROVIDER_SITE_OTHER): Payer: BC Managed Care – PPO | Admitting: Student

## 2022-10-12 ENCOUNTER — Other Ambulatory Visit (HOSPITAL_COMMUNITY)
Admission: RE | Admit: 2022-10-12 | Discharge: 2022-10-12 | Disposition: A | Discharge status: Home / Self Care | Payer: BC Managed Care – PPO | Source: Ambulatory Visit | Attending: Family Medicine | Admitting: Family Medicine

## 2022-10-12 VITALS — BP 112/74 | HR 81 | Wt 167.4 lb

## 2022-10-12 DIAGNOSIS — N898 Other specified noninflammatory disorders of vagina: Secondary | ICD-10-CM | POA: Diagnosis not present

## 2022-10-12 DIAGNOSIS — Z8742 Personal history of other diseases of the female genital tract: Secondary | ICD-10-CM

## 2022-10-12 DIAGNOSIS — Z124 Encounter for screening for malignant neoplasm of cervix: Secondary | ICD-10-CM | POA: Insufficient documentation

## 2022-10-12 LAB — POCT WET PREP (WET MOUNT)
Clue Cells Wet Prep Whiff POC: POSITIVE
Trichomonas Wet Prep HPF POC: ABSENT

## 2022-10-12 NOTE — Assessment & Plan Note (Signed)
History of HGSIL requiring LEEP procedure, high risk HPV but not 16/18/45 Pap smear performed today per ASCCP guidelines STI screening obtained as well Will follow-up on these results Encouraged safe sex practices

## 2022-10-12 NOTE — Patient Instructions (Addendum)
So great seeing you today!  We completed your pap smear today, I will call with the results.  We will see you next in May 11th-25th 2024 for your Depo shot/ follow up.   Dr. Orvis Brill, [place]

## 2022-10-18 LAB — CYTOLOGY - PAP
Chlamydia: NEGATIVE
Comment: NEGATIVE
Comment: NEGATIVE
Comment: NORMAL
Diagnosis: NEGATIVE
Neisseria Gonorrhea: NEGATIVE
Trichomonas: NEGATIVE

## 2022-10-21 ENCOUNTER — Telehealth: Payer: BC Managed Care – PPO | Admitting: Nurse Practitioner

## 2022-10-21 ENCOUNTER — Encounter: Payer: Self-pay | Admitting: Student

## 2022-10-21 DIAGNOSIS — B350 Tinea barbae and tinea capitis: Secondary | ICD-10-CM | POA: Diagnosis not present

## 2022-10-21 DIAGNOSIS — B354 Tinea corporis: Secondary | ICD-10-CM

## 2022-10-21 MED ORDER — KETOCONAZOLE 2 % EX SHAM: 1.0000 | MEDICATED_SHAMPOO | CUTANEOUS | 2 refills | Status: DC

## 2022-10-21 MED ORDER — FLUCONAZOLE 150 MG PO TABS: 150.0000 mg | ORAL_TABLET | Freq: Once | ORAL | 0 refills | Status: AC

## 2022-10-21 MED ORDER — TERBINAFINE HCL 1 % EX CREA: 1.0000 | TOPICAL_CREAM | Freq: Two times a day (BID) | CUTANEOUS | 2 refills | Status: DC

## 2022-10-21 NOTE — Progress Notes (Signed)
Virtual Visit Consent   Sabrina Brennan, you are scheduled for a virtual visit with a [facility] provider today. Just as with appointments in the office, your consent must be obtained to participate. Your consent will be active for this visit and any virtual visit you may have with one of our providers in the next 365 days. If you have a MyChart account, a copy of this consent can be sent to you electronically.  As this is a virtual visit, video technology does not allow for your provider to perform a traditional examination. This may limit your provider's ability to fully assess your condition. If your provider identifies any concerns that need to be evaluated in person or the need to arrange testing (such as labs, EKG, etc.), we will make arrangements to do so. Although advances in technology are sophisticated, we cannot ensure that it will always work on either your end or our end. If the connection with a video visit is poor, the visit may have to be switched to a telephone visit. With either a video or telephone visit, we are not always able to ensure that we have a secure connection.  By engaging in this virtual visit, you consent to the provision of healthcare and authorize for your insurance to be billed (if applicable) for the services provided during this visit. Depending on your insurance coverage, you may receive a charge related to this service.  I need to obtain your verbal consent now. Are you willing to proceed with your visit today? Sabrina Brennan has provided verbal consent on 10/21/2022 for a virtual visit (video or telephone). Apolonio Schneiders, FNP  Date: 10/21/2022 10:42 AM  Virtual Visit via Video Note   I, Apolonio Schneiders, [name]  ([id number], 22-Jun-1991) on 10/20/28 at 10:45 AM EDT by a video-enabled telemedicine application and verified that I am speaking with the correct person using two identifiers.  Location: Patient: Virtual Visit Location Patient:  Home Provider: Virtual Visit Location Provider: Home Office   I discussed the limitations of evaluation and management by telemedicine and the availability of in person appointments. The patient expressed understanding and agreed to proceed.    History of Present Illness: Sabrina Brennan is a 32 y.o. who identifies as a female who was assigned female at birth, and is being seen today for a recurrent rash  She has noted this over the past 5 years Skin will get dry and itchy and her skin will get darker in those areas  Her PCP gave her hydrocortisone recently and she has not had any improvement - that was started 09/24/22. She has been applying that to the back of her legs  The rash extends from back of legs to lower back and she has recently noted involvement of her chin  She has tried Eucerin  An Rx Shampoo   She has had a skin scraping and was told it was yeast- she was started on Diflucan at that time  No topical anti-fungal   She has itching in her scalp as well and her hairdresser has noted patches in her scalp  She has been on ketoconazole shampoo in the past with good outcomes  Notes her symptoms always worsen in the summer months    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, not intractable, w stat migr 05/14/2022   Uses birth control  10/29/2021   Hemiplegic migraine without status migrainosus, not intractable 10/29/2020   History of abnormal cervical Pap smear 08/28/2020   High grade squamous intraepithelial lesion (HGSIL), grade 3 CIN, 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    Allergies: No Known Allergies Medications:  Current Outpatient Medications:    scopolamine (TRANSDERM-SCOP) 1 MG/3DAYS, Place 1 patch (1.5 mg total) onto the skin every 3 (three) days., Disp: 4 patch, Rfl: 0   baclofen (LIORESAL) 10 MG tablet, Take 0.5 tablets (5 mg total) by mouth 3 (three) times  daily., Disp: 15 each, Rfl: 0   hydrocortisone 1 % ointment, Apply 1 Application topically 2 (two) times daily., Disp: 30 g, Rfl: 0   loperamide (IMODIUM) 2 MG capsule, Take 2 capsules (4 mg total) by mouth as needed for diarrhea or loose stools., Disp: 30 capsule, Rfl: 0   ondansetron (ZOFRAN-ODT) 4 MG disintegrating tablet, Take 1 tablet (4 mg total) by mouth every 8 (eight) hours as needed for nausea or vomiting., Disp: 20 tablet, Rfl: 0   zolmitriptan (ZOMIG-ZMT) 5 MG disintegrating tablet, Take 1 tablet (5 mg total) by mouth once as needed for up to 1 dose for migraine., Disp: 10 tablet, Rfl: 0  Observations/Objective: Patient is well-developed, well-nourished in no acute distress.  Resting comfortably  at home.  Head is normocephalic, atraumatic.  No labored breathing.  Speech is clear and coherent with logical content.  Patient is alert and oriented at baseline.    Assessment and Plan:  1. Tinea capitis  - ketoconazole (NIZORAL) 2 % shampoo; Apply 1 Application topically 2 (two) times a week.  Dispense: 120 mL; Refill: 2 - fluconazole (DIFLUCAN) 150 MG tablet; Take 1 tablet (150 mg total) by mouth once for 1 dose.  Dispense: 1 tablet; Refill: 0  2. Tinea corporis  - terbinafine (LAMISIL) 1 % cream; Apply 1 Application topically 2 (two) times daily.  Dispense: 30 g; Refill: 2 - fluconazole (DIFLUCAN) 150 MG tablet; Take 1 tablet (150 mg total) by mouth once for 1 dose.  Dispense: 1 tablet; Refill: 0    Follow Up Instructions: I discussed the assessment and treatment plan with the patient. The patient was provided an opportunity to ask questions and all were answered. The patient agreed with the plan and demonstrated an understanding of the instructions.  A copy of instructions were sent to the patient via MyChart unless otherwise noted below.    The patient was advised to call back or seek an in-person evaluation if the symptoms worsen or if the condition fails to improve as  anticipated.  Time:  I spent 15 minutes with the patient via telehealth technology discussing the above problems/concerns.    Apolonio Schneiders, FNP

## 2022-10-22 ENCOUNTER — Other Ambulatory Visit: Payer: Self-pay | Admitting: Student

## 2022-10-22 DIAGNOSIS — L989 Disorder of the skin and subcutaneous tissue, unspecified: Secondary | ICD-10-CM

## 2022-10-25 ENCOUNTER — Ambulatory Visit: Payer: BC Managed Care – PPO | Admitting: Family Medicine

## 2022-12-05 ENCOUNTER — Telehealth: Payer: BC Managed Care – PPO | Admitting: Nurse Practitioner

## 2022-12-05 ENCOUNTER — Emergency Department (HOSPITAL_COMMUNITY)
Admission: EM | Admit: 2022-12-05 | Discharge: 2022-12-05 | Disposition: A | Discharge status: Home / Self Care | Payer: BC Managed Care – PPO | Attending: Emergency Medicine | Admitting: Emergency Medicine

## 2022-12-05 ENCOUNTER — Encounter (HOSPITAL_COMMUNITY): Payer: Self-pay | Admitting: Emergency Medicine

## 2022-12-05 DIAGNOSIS — B9689 Other specified bacterial agents as the cause of diseases classified elsewhere: Secondary | ICD-10-CM

## 2022-12-05 DIAGNOSIS — J019 Acute sinusitis, unspecified: Secondary | ICD-10-CM

## 2022-12-05 DIAGNOSIS — Z1152 Encounter for screening for COVID-19: Secondary | ICD-10-CM | POA: Insufficient documentation

## 2022-12-05 DIAGNOSIS — R0981 Nasal congestion: Secondary | ICD-10-CM | POA: Diagnosis present

## 2022-12-05 LAB — RESP PANEL BY RT-PCR (RSV, FLU A&B, COVID)  RVPGX2
Influenza A by PCR: NEGATIVE
Influenza B by PCR: NEGATIVE
Resp Syncytial Virus by PCR: NEGATIVE
SARS Coronavirus 2 by RT PCR: NEGATIVE

## 2022-12-05 LAB — GROUP A STREP BY PCR: Group A Strep by PCR: NOT DETECTED

## 2022-12-05 MED ORDER — LORATADINE 10 MG PO TABS
10.0000 mg | ORAL_TABLET | Freq: Once | ORAL | Status: AC
  Administered 2022-12-05: 10 mg via ORAL
  Filled 2022-12-05: qty 1

## 2022-12-05 MED ORDER — AZELASTINE-FLUTICASONE 137-50 MCG/ACT NA SUSP: 1.0000 | Freq: Two times a day (BID) | NASAL | 0 refills | Status: DC

## 2022-12-05 MED ORDER — AMOXICILLIN-POT CLAVULANATE 875-125 MG PO TABS: 1.0000 | ORAL_TABLET | Freq: Two times a day (BID) | ORAL | 0 refills | Status: AC

## 2022-12-05 MED ORDER — PROMETHAZINE-DM 6.25-15 MG/5ML PO SYRP: 5.0000 mL | ORAL_SOLUTION | Freq: Four times a day (QID) | ORAL | 0 refills | Status: DC | PRN

## 2022-12-05 MED ORDER — NAPROXEN 250 MG PO TABS
500.0000 mg | ORAL_TABLET | Freq: Once | ORAL | Status: AC
  Administered 2022-12-05: 500 mg via ORAL
  Filled 2022-12-05: qty 2

## 2022-12-05 NOTE — ED Triage Notes (Signed)
Pt here from home with cough and congestion and sore throat

## 2022-12-05 NOTE — Progress Notes (Signed)
Virtual Visit Consent   Sabrina Brennan, you are scheduled for a virtual visit with a [facility] provider today. Just as with appointments in the office, your consent must be obtained to participate. Your consent will be active for this visit and any virtual visit you may have with one of our providers in the next 365 days. If you have a MyChart account, a copy of this consent can be sent to you electronically.  As this is a virtual visit, video technology does not allow for your provider to perform a traditional examination. This may limit your provider's ability to fully assess your condition. If your provider identifies any concerns that need to be evaluated in person or the need to arrange testing (such as labs, EKG, etc.), we will make arrangements to do so. Although advances in technology are sophisticated, we cannot ensure that it will always work on either your end or our end. If the connection with a video visit is poor, the visit may have to be switched to a telephone visit. With either a video or telephone visit, we are not always able to ensure that we have a secure connection.  By engaging in this virtual visit, you consent to the provision of healthcare and authorize for your insurance to be billed (if applicable) for the services provided during this visit. Depending on your insurance coverage, you may receive a charge related to this service.  I need to obtain your verbal consent now. Are you willing to proceed with your visit today? Sabrina Brennan has provided verbal consent on 12/05/2022 for a virtual visit (video or telephone). Claiborne Rigg, NP  Date: 12/05/2022 9:12 AM  Virtual Visit via Video Note   I, Claiborne Rigg, [name]  ([id number], 09/14/1990) on 12/05/22 at  9:00 AM EDT by a video-enabled telemedicine application and verified that I am speaking with the correct person using two identifiers.  Location: Patient: Virtual Visit Location Patient:  Home Provider: Virtual Visit Location Provider: Home Office   I discussed the limitations of evaluation and management by telemedicine and the availability of in person appointments. The patient expressed understanding and agreed to proceed.    History of Present Illness: Sabrina Brennan is a 32 y.o. who identifies as a female who was assigned female at birth, and is being seen today for bacterial sinusitis.  [name] is experiencing the following symptoms since 5 days ago: chest congestion, maxillary anf frontal sinus pressure, dry hacking cough, nasal congestion, PND and headache. She has had a sinus infection in the past however this feels worse. States COVID test was negative. 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/29/2020   History of abnormal cervical Pap smear 08/28/2020   High grade squamous intraepithelial lesion (HGSIL), grade 3 CIN, 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    Allergies: No Known Allergies Medications:  Current Outpatient Medications:    amoxicillin-clavulanate (AUGMENTIN) 875-125 MG tablet, Take 1 tablet by mouth 2 (two) times daily for 7 days., Disp: 14 tablet, Rfl: 0   Azelastine-Fluticasone 137-50 MCG/ACT SUSP, Place 1 spray into the nose every 12 (twelve) hours., Disp: 23  g, Rfl: 0   promethazine-dextromethorphan (PROMETHAZINE-DM) 6.25-15 MG/5ML syrup, Take 5 mLs by mouth 4 (four) times daily as needed for cough., Disp: 120 mL, Rfl: 0   scopolamine (TRANSDERM-SCOP) 1 MG/3DAYS, Place 1 patch (1.5 mg total) onto the skin every 3 (three) days., Disp: 4 patch, Rfl: 0   baclofen (LIORESAL) 10 MG tablet, Take 0.5 tablets (5 mg total) by  mouth 3 (three) times daily., Disp: 15 each, Rfl: 0   hydrocortisone 1 % ointment, Apply 1 Application topically 2 (two) times daily., Disp: 30 g, Rfl: 0   ketoconazole (NIZORAL) 2 % shampoo, Apply 1 Application topically 2 (two) times a week., Disp: 120 mL, Rfl: 2   loperamide (IMODIUM) 2 MG capsule, Take 2 capsules (4 mg total) by mouth as needed for diarrhea or loose stools., Disp: 30 capsule, Rfl: 0   ondansetron (ZOFRAN-ODT) 4 MG disintegrating tablet, Take 1 tablet (4 mg total) by mouth every 8 (eight) hours as needed for nausea or vomiting., Disp: 20 tablet, Rfl: 0   terbinafine (LAMISIL) 1 % cream, Apply 1 Application topically 2 (two) times daily., Disp: 30 g, Rfl: 2   zolmitriptan (ZOMIG-ZMT) 5 MG disintegrating tablet, Take 1 tablet (5 mg total) by mouth once as needed for up to 1 dose for migraine., Disp: 10 tablet, Rfl: 0  Observations/Objective: Patient is well-developed, well-nourished in no acute distress.  Resting comfortably  at home.  Head is normocephalic, atraumatic.  No labored breathing.  Speech is clear and coherent with logical content.  Patient is alert and oriented at baseline.  Dry hacking cough  Assessment and Plan: 1. Acute bacterial sinusitis - Azelastine-Fluticasone 137-50 MCG/ACT SUSP; Place 1 spray into the nose every 12 (twelve) hours.  Dispense: 23 g; Refill: 0 - promethazine-dextromethorphan (PROMETHAZINE-DM) 6.25-15 MG/5ML syrup; Take 5 mLs by mouth 4 (four) times daily as needed for cough.  Dispense: 120 mL; Refill: 0 - amoxicillin-clavulanate (AUGMENTIN) 875-125 MG tablet; Take 1 tablet by mouth 2 (two) times daily for 7 days.  Dispense: 14 tablet; Refill: 0   Follow Up Instructions: I discussed the assessment and treatment plan with the patient. The patient was provided an opportunity to ask questions and all were answered. The patient agreed with the plan and demonstrated an understanding of the instructions.  A copy of instructions were sent to the  patient via MyChart unless otherwise noted below.   The patient was advised to call back or seek an in-person evaluation if the symptoms worsen or if the condition fails to improve as anticipated.  Time:  I spent 11 minutes with the patient via telehealth technology discussing the above problems/concerns.    Claiborne Rigg, NP

## 2022-12-05 NOTE — Patient Instructions (Signed)
Sabrina Brennan, thank you for joining Claiborne Rigg, NP for today's virtual visit.  While this provider is not your primary care provider (PCP), if your PCP is located in our provider database this encounter information will be shared with them immediately following your visit.   A [facility] MyChart account gives you access to today's visit and all your visits, tests, and labs performed at [facility] " click here if you don't have a [facility] MyChart account or go to [email or web address]  Consent: (Patient) Sabrina Brennan provided verbal consent for this virtual visit at the beginning of the encounter.  Current Medications:  Current Outpatient Medications:    amoxicillin-clavulanate (AUGMENTIN) 875-125 MG tablet, Take 1 tablet by mouth 2 (two) times daily for 7 days., Disp: 14 tablet, Rfl: 0   Azelastine-Fluticasone 137-50 MCG/ACT SUSP, Place 1 spray into the nose every 12 (twelve) hours., Disp: 23 g, Rfl: 0   promethazine-dextromethorphan (PROMETHAZINE-DM) 6.25-15 MG/5ML syrup, Take 5 mLs by mouth 4 (four) times daily as needed for cough., Disp: 120 mL, Rfl: 0   scopolamine (TRANSDERM-SCOP) 1 MG/3DAYS, Place 1 patch (1.5 mg total) onto the skin every 3 (three) days., Disp: 4 patch, Rfl: 0   baclofen (LIORESAL) 10 MG tablet, Take 0.5 tablets (5 mg total) by mouth 3 (three) times daily., Disp: 15 each, Rfl: 0   hydrocortisone 1 % ointment, Apply 1 Application topically 2 (two) times daily., Disp: 30 g, Rfl: 0   ketoconazole (NIZORAL) 2 % shampoo, Apply 1 Application topically 2 (two) times a week., Disp: 120 mL, Rfl: 2   loperamide (IMODIUM) 2 MG capsule, Take 2 capsules (4 mg total) by mouth as needed for diarrhea or loose stools., Disp: 30 capsule, Rfl: 0   ondansetron (ZOFRAN-ODT) 4 MG disintegrating tablet, Take 1 tablet (4 mg total) by mouth every 8 (eight) hours as needed for nausea or vomiting., Disp: 20 tablet, Rfl: 0   terbinafine (LAMISIL) 1 % cream, Apply 1  Application topically 2 (two) times daily., Disp: 30 g, Rfl: 2   zolmitriptan (ZOMIG-ZMT) 5 MG disintegrating tablet, Take 1 tablet (5 mg total) by mouth once as needed for up to 1 dose for migraine., Disp: 10 tablet, Rfl: 0   Medications ordered in this encounter:  Meds ordered this encounter  Medications   Azelastine-Fluticasone 137-50 MCG/ACT SUSP    Sig: Place 1 spray into the nose every 12 (twelve) hours.    Dispense:  23 g    Refill:  0    Order Specific Question:   Supervising Provider    Answer:   Merrilee Jansky [id number]   promethazine-dextromethorphan (PROMETHAZINE-DM) 6.25-15 MG/5ML syrup    Sig: Take 5 mLs by mouth 4 (four) times daily as needed for cough.    Dispense:  120 mL    Refill:  0    Order Specific Question:   Supervising Provider    Answer:   Merrilee Jansky [id number]   amoxicillin-clavulanate (AUGMENTIN) 875-125 MG tablet    Sig: Take 1 tablet by mouth 2 (two) times daily for 7 days.    Dispense:  14 tablet    Refill:  0    Order Specific Question:   Supervising Provider    Answer:   Merrilee Jansky [id number]     *If you need refills on other medications prior to your next appointment, please contact your pharmacy*  Follow-Up: Call back or seek an in-person evaluation if the symptoms worsen or if the condition fails  to improve as anticipated.  [facility] [phone]  Other Instructions INSTRUCTIONS: use a humidifier for nasal congestion Drink plenty of fluids, rest and wash hands frequently to avoid the spread of infection Alternate tylenol and Motrin for relief of fever    If you have been instructed to have an in-person evaluation today at a local Urgent Care facility, please use the link below. It will take you to a list of all of our available [facility], including address, phone number and hours of operation. Please do not delay care.  [facility]  If you or a family member do not have a primary  care provider, use the link below to schedule a visit and establish care. When you choose a [facility] primary care physician or advanced practice provider, you gain a long-term partner in health. Find a Primary Care Provider  Learn more about [facility] in-office and virtual care options: [facility] - Get Care Now

## 2022-12-05 NOTE — ED Provider Notes (Signed)
[place] EMERGENCY DEPARTMENT AT [facility] Provider Note   CSN: [id number] Arrival date & time: 12/05/22  1933     History  Chief Complaint  Patient presents with   URI    Sabrina Brennan is a 32 y.o. female.  32 year old female presents to the emergency department for evaluation of sinus congestion, sore throat, cough.  She has had symptoms for the past 4 days.  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  She was seen by a family practice physician this morning and given a prescription for Augmentin as well as Promethazine DM.  She has been using these medications; complains that the promethazine made her sleepy.  Developed myalgias tonight, and felt she needed to be evaluated in the ED.  She has not had any documented fevers.  No shortness of breath, vomiting.  The history is provided by the patient. No language interpreter was used.  URI      Home Medications Prior to Admission medications   Medication Sig Start Date End Date Taking? Authorizing Provider  scopolamine (TRANSDERM-SCOP) 1 MG/3DAYS Place 1 patch (1.5 mg total) onto the skin every 3 (three) days. 08/19/22   Dameron, Nolberto Hanlon, DO  amoxicillin-clavulanate (AUGMENTIN) 875-125 MG tablet Take 1 tablet by mouth 2 (two) times daily for 7 days. 12/05/22 12/12/22  Claiborne Rigg, NP  Azelastine-Fluticasone 137-50 MCG/ACT SUSP Place 1 spray into the nose every 12 (twelve) hours. 12/05/22   Claiborne Rigg, NP  baclofen (LIORESAL) 10 MG tablet Take 0.5 tablets (5 mg total) by mouth 3 (three) times daily. 07/08/22   Lilland, Alana, DO  hydrocortisone 1 % ointment Apply 1 Application topically 2 (two) times daily. 09/24/22   Dameron, Nolberto Hanlon, DO  ketoconazole (NIZORAL) 2 % shampoo Apply 1 Application topically 2 (two) times a week. 10/21/22   Viviano Simas, FNP  loperamide (IMODIUM) 2 MG capsule Take 2 capsules (4 mg total) by mouth as needed for diarrhea or loose stools. 09/24/22    Dameron, Nolberto Hanlon, DO  ondansetron (ZOFRAN-ODT) 4 MG disintegrating tablet Take 1 tablet (4 mg total) by mouth every 8 (eight) hours as needed for nausea or vomiting. 07/20/22   Waldon Merl, PA-C  promethazine-dextromethorphan (PROMETHAZINE-DM) 6.25-15 MG/5ML syrup Take 5 mLs by mouth 4 (four) times daily as needed for cough. 12/05/22   Claiborne Rigg, NP  terbinafine (LAMISIL) 1 % cream Apply 1 Application topically 2 (two) times daily. 10/21/22   Viviano Simas, FNP  zolmitriptan (ZOMIG-ZMT) 5 MG disintegrating tablet Take 1 tablet (5 mg total) by mouth once as needed for up to 1 dose for migraine. 08/12/22   Freddy Finner, NP      Allergies    Patient has no known allergies.    Review of Systems   Review of Systems Ten systems reviewed and are negative for acute change, except as noted in the HPI.    Physical Exam Updated Vital Signs BP 112/85   Pulse 88   Temp 98.2 F (36.8 C)   Resp 18   SpO2 100%   Physical Exam Vitals and nursing note reviewed.  Constitutional:      General: She is not in acute distress.    Appearance: She is well-developed. She is not diaphoretic.     Comments: Nontoxic appearing and in NAD  HENT:     Head: Normocephalic and atraumatic.     Nose: Congestion present. No rhinorrhea.  Eyes:  General: No scleral icterus.    Conjunctiva/sclera: Conjunctivae normal.  Cardiovascular:     Rate and Rhythm: Normal rate and regular rhythm.     Pulses: Normal pulses.  Pulmonary:     Effort: Pulmonary effort is normal. No respiratory distress.     Breath sounds: No stridor.     Comments: Respirations even and unlabored Musculoskeletal:        General: Normal range of motion.     Cervical back: Normal range of motion.  Skin:    General: Skin is warm and dry.     Coloration: Skin is not pale.     Findings: No erythema or rash.  Neurological:     Mental Status: She is alert and oriented to person, place, and time.     Coordination: Coordination  normal.  Psychiatric:        Behavior: Behavior normal.     ED Results / Procedures / Treatments   Labs (all labs ordered are listed, but only abnormal results are displayed) Labs Reviewed  GROUP A STREP BY PCR  RESP PANEL BY RT-PCR (RSV, FLU A&B, COVID)  [id number]    EKG None  Radiology No results found.  Procedures Procedures    Medications Ordered in ED Medications  naproxen (NAPROSYN) tablet 500 mg (has no administration in time range)  loratadine (CLARITIN) tablet 10 mg (has no administration in time range)    ED Course/ Medical Decision Making/ A&P                             Medical Decision Making Risk OTC drugs. Prescription drug management.   This patient presents to the ED for concern of URI symptoms, this involves an extensive number of treatment options, and is a complaint that carries with it a high risk of complications and morbidity.  The differential diagnosis includes viral URI vs sinusitis vs PNA vs strep pharyngitis   Co morbidities that complicate the patient evaluation  Migraine    Additional history obtained:  External records from outside source obtained and reviewed including outpatient family medicine note from earlier today   Lab Tests:  I Ordered, and personally interpreted labs.  The pertinent results include:  Negative strep screen. Negative respiratory panel.   Cardiac Monitoring:  The patient was maintained on a cardiac monitor.  I personally viewed and interpreted the cardiac monitored which showed an underlying rhythm of: NSR   Medicines ordered and prescription drug management:  I ordered medication including Naproxen for headache and myalgias, Claritin for congestion  I have reviewed the patients home medicines and have made adjustments as needed   Test Considered:  CXR - however, no SOB, tachypnea, dyspnea, hypoxia.   Problem List / ED Course:  Sinusitis symptoms, already on Augmentin course. Discussed that  symptoms may also be viral. Afebrile in the ED. No concern for SIRS/sepsis. Tolerating secretions. Normal phonation, no tripoding. No concern for PTA, epiglottitis. No nuchal rigidity or meningismus to suggest meningitis; likely sinus headache. Stable for d/c and outpatient supportive care.    Reevaluation:  After the interventions noted above, I reevaluated the patient and found that they have :stayed the same   Social Determinants of Health:  Lives independently   Dispostion:  After consideration of the diagnostic results and the patients response to treatment, I feel that the patent would benefit from completion of Augmentin course, PCP f/u for recheck. Return precautions discussed and provided. Patient discharged in  stable condition with no unaddressed concerns.          Final Clinical Impression(s) / ED Diagnoses Final diagnoses:  Acute sinusitis, recurrence not specified, unspecified location    Rx / DC Orders ED Discharge Orders     None         Antony Madura, PA-C 12/05/22 2257    Pricilla Loveless, MD 12/08/22 [phone]

## 2022-12-05 NOTE — Discharge Instructions (Signed)
Continue your prescribed medications and antibiotic. Take 600mg  ibuprofen every 6 hours for body aches and headache. We also advise daily Zyrtec or Claritin for management of congestion. You may try saline sinus rinses if congestion persists.

## 2022-12-16 ENCOUNTER — Ambulatory Visit: Payer: BC Managed Care – PPO

## 2022-12-16 ENCOUNTER — Ambulatory Visit (INDEPENDENT_AMBULATORY_CARE_PROVIDER_SITE_OTHER): Payer: BC Managed Care – PPO

## 2022-12-16 DIAGNOSIS — Z789 Other specified health status: Secondary | ICD-10-CM

## 2022-12-16 MED ORDER — MEDROXYPROGESTERONE ACETATE 150 MG/ML IM SUSY
150.0000 mg | PREFILLED_SYRINGE | Freq: Once | INTRAMUSCULAR | Status: AC
Start: 2022-12-16 — End: 2022-12-16
  Administered 2022-12-16: 150 mg via INTRAMUSCULAR

## 2022-12-16 NOTE — Progress Notes (Signed)
Patient here today for Depo Provera injection and is within her dates.    Last contraceptive appt was 09/24/2022.  Depo given in RUOQ today.  Site unremarkable & patient tolerated injection.    Next injection due 03/03/2023-03/17/2023.  Reminder card given.

## 2022-12-31 ENCOUNTER — Encounter: Payer: Self-pay | Admitting: Student

## 2023-01-17 ENCOUNTER — Ambulatory Visit (INDEPENDENT_AMBULATORY_CARE_PROVIDER_SITE_OTHER): Payer: BC Managed Care – PPO

## 2023-01-17 ENCOUNTER — Ambulatory Visit
Admission: RE | Admit: 2023-01-17 | Discharge: 2023-01-17 | Disposition: A | Discharge status: Home / Self Care | Payer: BC Managed Care – PPO | Source: Ambulatory Visit | Attending: Internal Medicine | Admitting: Internal Medicine

## 2023-01-17 ENCOUNTER — Ambulatory Visit: Payer: BC Managed Care – PPO | Admitting: Student

## 2023-01-17 VITALS — BP 131/94 | HR 85 | Temp 98.3°F | Resp 16

## 2023-01-17 DIAGNOSIS — R109 Unspecified abdominal pain: Secondary | ICD-10-CM | POA: Diagnosis present

## 2023-01-17 DIAGNOSIS — R102 Pelvic and perineal pain: Secondary | ICD-10-CM | POA: Diagnosis present

## 2023-01-17 DIAGNOSIS — K5900 Constipation, unspecified: Secondary | ICD-10-CM | POA: Diagnosis present

## 2023-01-17 LAB — POCT URINALYSIS DIP (MANUAL ENTRY)
Bilirubin, UA: NEGATIVE
Glucose, UA: NEGATIVE mg/dL
Ketones, POC UA: NEGATIVE mg/dL
Leukocytes, UA: NEGATIVE
Nitrite, UA: NEGATIVE
Protein Ur, POC: NEGATIVE mg/dL
Spec Grav, UA: >=1.03 — AB (ref 1.010–1.025)
Urobilinogen, UA: 1 E.U./dL
pH, UA: 6 (ref 5.0–8.0)

## 2023-01-17 LAB — POCT URINE PREGNANCY: Preg Test, Ur: NEGATIVE

## 2023-01-17 MED ORDER — NAPROXEN 375 MG PO TABS: 375.0000 mg | ORAL_TABLET | Freq: Two times a day (BID) | ORAL | 0 refills | Status: DC

## 2023-01-17 MED ORDER — DOCUSATE SODIUM 100 MG PO CAPS: 100.0000 mg | ORAL_CAPSULE | Freq: Two times a day (BID) | ORAL | 0 refills | Status: AC

## 2023-01-17 NOTE — Discharge Instructions (Addendum)
For moderate to severe constipation (not having a bowel movement in more than 3 days) then try to use an enema or Miralax once daily until you have a good bowel movement.  It is not a good idea to use an enema or laxatives daily. If you find you are doing this, then please follow up with a gastroenterologist. Otherwise, a medication you could use daily to help with promoting bowel movements is docusate (Colace) 100mg. It is okay to use this 1-2 times daily as a stool softener.  Try to stay active physically including regular exercise 2-3 times a week.  Make sure you hydrate well every day with about 80 ounces of water daily (that is ~2 liters).  Try to avoid carb heavy foods, dairy. This includes cutting out breads, pasta, pizza, pastries, potatoes, rice, starchy foods in general. Eat more fiber as listed below:  Salads - kale, spinach, cabbage, spring mix, arugula Fruits - avocadoes, berries (blueberries, raspberries, blackberries), apples, oranges, pomegranate, grapefruit, kiwi Vegetables - asparagus, cauliflower, broccoli, green beans, brussel sprouts, bell peppers, beets; stay away from or limit starchy vegetables like potatoes, carrots, peas Other general foods - kidney beans, egg whites, almonds, walnuts, sunflower seeds, pumpkin seeds, fat free yogurt, almond milk, flax seeds, quinoa, oats  Meat - It is better to eat lean meats and limit your red meat including pork to once a week.  Wild caught fish, chicken breast are good options as they tend to be leaner sources of good protein. Still be mindful of the sodium labels for the meats you buy.  DO NOT EAT ANY FOODS ON THIS LIST THAT YOU ARE ALLERGIC TO. For more specific needs, I highly recommend consulting a dietician or nutritionist but this can definitely be a good starting point.  

## 2023-01-17 NOTE — ED Provider Notes (Signed)
[facility] - URGENT CARE CENTER  Note:  This document was prepared using Conservation officer, historic buildings and may include unintentional dictation errors.  MRN: [id number] DOB: Mar 28, 1991  Subjective:   Sabrina Brennan is a 32 y.o. female presenting for 3-day history of left-sided abdominal/flank pain, knot-like sensation to the area.  Denies fever, nausea, vomiting, diarrhea, bloody stools.  No dysuria, urinary frequency.  No changes to her vaginal discharge.  She does admit longstanding history of constipation.  Last bowel movement was Saturday.  She does have unprotected sex with 1 female partner but just found out that he is cheating on her.  No current facility-administered medications for this encounter.  Current Outpatient Medications:    scopolamine (TRANSDERM-SCOP) 1 MG/3DAYS, Place 1 patch (1.5 mg total) onto the skin every 3 (three) days., Disp: 4 patch, Rfl: 0   Azelastine-Fluticasone 137-50 MCG/ACT SUSP, Place 1 spray into the nose every 12 (twelve) hours., Disp: 23 g, Rfl: 0   baclofen (LIORESAL) 10 MG tablet, Take 0.5 tablets (5 mg total) by mouth 3 (three) times daily., Disp: 15 each, Rfl: 0   hydrocortisone 1 % ointment, Apply 1 Application topically 2 (two) times daily., Disp: 30 g, Rfl: 0   ketoconazole (NIZORAL) 2 % shampoo, Apply 1 Application topically 2 (two) times a week., Disp: 120 mL, Rfl: 2   loperamide (IMODIUM) 2 MG capsule, Take 2 capsules (4 mg total) by mouth as needed for diarrhea or loose stools., Disp: 30 capsule, Rfl: 0   ondansetron (ZOFRAN-ODT) 4 MG disintegrating tablet, Take 1 tablet (4 mg total) by mouth every 8 (eight) hours as needed for nausea or vomiting., Disp: 20 tablet, Rfl: 0   promethazine-dextromethorphan (PROMETHAZINE-DM) 6.25-15 MG/5ML syrup, Take 5 mLs by mouth 4 (four) times daily as needed for cough., Disp: 120 mL, Rfl: 0   terbinafine (LAMISIL) 1 % cream, Apply 1 Application topically 2 (two) times daily., Disp: 30 g, Rfl: 2    zolmitriptan (ZOMIG-ZMT) 5 MG disintegrating tablet, Take 1 tablet (5 mg total) by mouth once as needed for up to 1 dose for migraine., Disp: 10 tablet, Rfl: 0   No Known Allergies  Past Medical History:  Diagnosis Date   Medical history non-contributory    Migraine      Past Surgical History:  Procedure Laterality Date   EXCISION MASS LOWER EXTREMETIES Left 12/11/2019   Procedure: EXCISION LEFT THIGH CYST;  Surgeon: Harriette Bouillon, MD;  Location: [facility];  Service: General;  Laterality: Left;   LIPOSUCTION MULTIPLE BODY PARTS      Family History  Problem Relation Age of Onset   Healthy Mother    Healthy Father    Breast cancer Maternal Grandmother    Hypertension Maternal Grandmother    Pancreatic cancer Maternal Grandfather    Migraines Maternal Grandfather    Breast cancer Other        Maternal aunt    Social History   Tobacco Use   Smoking status: Former    Passive exposure: Never   Smokeless tobacco: Never   Tobacco comments:    Hooka 2x monthly  Vaping Use   Vaping Use: Never used  Substance Use Topics   Alcohol use: Yes    Comment: occ   Drug use: No    ROS   Objective:   Vitals: BP (!) 131/94 (BP Location: Right Arm)   Pulse 85   Temp 98.3 F (36.8 C) (Oral)   Resp 16   SpO2 98%  Physical Exam Constitutional:      General: She is not in acute distress.    Appearance: Normal appearance. She is well-developed. She is not ill-appearing, toxic-appearing or diaphoretic.  HENT:     Head: Normocephalic and atraumatic.     Nose: Nose normal.     Mouth/Throat:     Mouth: Mucous membranes are moist.     Pharynx: Oropharynx is clear.  Eyes:     General: No scleral icterus.       Right eye: No discharge.        Left eye: No discharge.     Extraocular Movements: Extraocular movements intact.     Conjunctiva/sclera: Conjunctivae normal.  Cardiovascular:     Rate and Rhythm: Normal rate.  Pulmonary:     Effort: Pulmonary effort  is normal.  Abdominal:     General: Bowel sounds are normal. There is no distension.     Palpations: Abdomen is soft. There is no mass.     Tenderness: There is abdominal tenderness (left mid abdomen) in the periumbilical area and left lower quadrant. There is guarding. There is no right CVA tenderness, left CVA tenderness or rebound.  Skin:    General: Skin is warm and dry.  Neurological:     General: No focal deficit present.     Mental Status: She is alert and oriented to person, place, and time.  Psychiatric:        Mood and Affect: Mood normal.        Behavior: Behavior normal.        Thought Content: Thought content normal.        Judgment: Judgment normal.      Results for orders placed or performed during the hospital encounter of 01/17/23 (from the past 24 hour(s))  POCT urinalysis dipstick     Status: Abnormal   Collection Time: 01/17/23  5:20 PM  Result Value Ref Range   Color, UA yellow yellow   Clarity, UA clear clear   Glucose, UA negative negative mg/dL   Bilirubin, UA negative negative   Ketones, POC UA negative negative mg/dL   Spec Grav, UA >=2.229 (A) 1.010 - 1.025   Blood, UA trace-intact (A) negative   pH, UA 6.0 5.0 - 8.0   Protein Ur, POC negative negative mg/dL   Urobilinogen, UA 1.0 0.2 or 1.0 E.U./dL   Nitrite, UA Negative Negative   Leukocytes, UA Negative Negative  POCT urine pregnancy     Status: None   Collection Time: 01/17/23  5:20 PM  Result Value Ref Range   Preg Test, Ur Negative Negative    Assessment and Plan :   PDMP not reviewed this encounter.  1. Left sided abdominal pain   2. Pelvic pain   3. Constipation, unspecified constipation type    X-ray over-read was pending at time of discharge, recommended follow up with only abnormal results. Otherwise will not call for negative over-read. Patient was in agreement.  Will defer urine culture given lack of urinary symptoms.  Vaginal cytology pending.  Low suspicion for an acute  abdomen, acute gynecologic process.  Recommended managing conservatively with naproxen for her pain, emphasized need to hydrate much more consistently and will manage for constipation as well.  Start docusate, increase fiber intake.  Will update diagnoses and treatment plan following the overread as deemed necessary.  Counseled patient on potential for adverse effects with medications prescribed/recommended today, ER and return-to-clinic precautions discussed, patient verbalized understanding.    Wallis Bamberg,  PA-C 01/17/23 1806

## 2023-01-17 NOTE — ED Triage Notes (Addendum)
Pt c/o left side abd pain pain that feels like a knot x 3 days-no relief with tylenol-pain worse with palpation-denies n/v/d, urinary sx and bowel changes-NAD-steady gait

## 2023-01-18 LAB — CERVICOVAGINAL ANCILLARY ONLY
Bacterial Vaginitis (gardnerella): POSITIVE — AB
Chlamydia: NEGATIVE
Comment: NEGATIVE
Comment: NEGATIVE
Comment: NEGATIVE
Comment: NORMAL
Neisseria Gonorrhea: NEGATIVE
Trichomonas: NEGATIVE

## 2023-01-19 ENCOUNTER — Telehealth (HOSPITAL_COMMUNITY): Payer: Self-pay | Admitting: Emergency Medicine

## 2023-01-19 MED ORDER — METRONIDAZOLE 500 MG PO TABS: 500.0000 mg | ORAL_TABLET | Freq: Two times a day (BID) | ORAL | 0 refills | Status: DC

## 2023-02-28 ENCOUNTER — Telehealth: Payer: BC Managed Care – PPO | Admitting: Physician Assistant

## 2023-02-28 DIAGNOSIS — L551 Sunburn of second degree: Secondary | ICD-10-CM

## 2023-02-28 MED ORDER — PREDNISONE 20 MG PO TABS
40.0000 mg | ORAL_TABLET | Freq: Every day | ORAL | 0 refills | Status: DC
Start: 2023-02-28 — End: 2023-04-28

## 2023-02-28 NOTE — Progress Notes (Signed)
Virtual Visit Consent   Sabrina Brennan, you are scheduled for a virtual visit with a [facility] provider today. Just as with appointments in the office, your consent must be obtained to participate. Your consent will be active for this visit and any virtual visit you may have with one of our providers in the next 365 days. If you have a MyChart account, a copy of this consent can be sent to you electronically.  As this is a virtual visit, video technology does not allow for your provider to perform a traditional examination. This may limit your provider's ability to fully assess your condition. If your provider identifies any concerns that need to be evaluated in person or the need to arrange testing (such as labs, EKG, etc.), we will make arrangements to do so. Although advances in technology are sophisticated, we cannot ensure that it will always work on either your end or our end. If the connection with a video visit is poor, the visit may have to be switched to a telephone visit. With either a video or telephone visit, we are not always able to ensure that we have a secure connection.  By engaging in this virtual visit, you consent to the provision of healthcare and authorize for your insurance to be billed (if applicable) for the services provided during this visit. Depending on your insurance coverage, you may receive a charge related to this service.  I need to obtain your verbal consent now. Are you willing to proceed with your visit today? KEALOHILANI HERMANNS has provided verbal consent on 02/28/2023 for a virtual visit (video or telephone). [name], PA-C  Date: 02/28/2023 4:03 PM  Virtual Visit via Video Note   I, [name], [name]  ([id number], May 16, 1991) on 02/28/23 at  4:00 PM EDT by a video-enabled telemedicine application and verified that I am speaking with the correct person using two identifiers.  Location: Patient: Virtual Visit Location  Patient: Mobile Provider: Virtual Visit Location Provider: Home Office   I discussed the limitations of evaluation and management by telemedicine and the availability of in person appointments. The patient expressed understanding and agreed to proceed.    History of Present Illness: Sabrina Brennan is a 32 y.o. who identifies as a female who was assigned female at birth, and is being seen today for sunburn.  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   History of abnormal cervical Pap smear 08/28/2020   High grade squamous intraepithelial lesion (HGSIL), grade 3 CIN, 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    Allergies: No Known Allergies Medications:  Current Outpatient Medications:    predniSONE (DELTASONE) 20 MG tablet, Take 2 tablets (40 mg total) by mouth daily with breakfast., Disp: 10 tablet, Rfl: 0   scopolamine (TRANSDERM-SCOP) 1 MG/3DAYS, Place 1 patch (1.5 mg total) onto the skin every 3 (three) days., Disp: 4 patch, Rfl: 0   Azelastine-Fluticasone 137-50 MCG/ACT SUSP, Place 1 spray into the nose every 12 (twelve) hours., Disp: 23 g, Rfl: 0   baclofen (LIORESAL) 10 MG tablet, Take 0.5 tablets (5 mg total) by mouth 3 (three) times daily., Disp: 15 each, Rfl: 0   docusate sodium (COLACE) 100 MG capsule, Take  1 capsule (100 mg total) by mouth every 12 (twelve) hours., Disp: 60 capsule, Rfl: 0   hydrocortisone 1 % ointment, Apply 1 Application topically 2 (two) times daily., Disp: 30 g, Rfl: 0   ketoconazole (NIZORAL) 2 % shampoo, Apply 1 Application topically 2 (two) times a week., Disp: 120 mL, Rfl: 2   loperamide (IMODIUM) 2 MG capsule, Take 2 capsules (4 mg total) by mouth as needed for diarrhea or loose stools., Disp: 30  capsule, Rfl: 0   metroNIDAZOLE (FLAGYL) 500 MG tablet, Take 1 tablet (500 mg total) by mouth 2 (two) times daily., Disp: 14 tablet, Rfl: 0   naproxen (NAPROSYN) 375 MG tablet, Take 1 tablet (375 mg total) by mouth 2 (two) times daily with a meal., Disp: 30 tablet, Rfl: 0   ondansetron (ZOFRAN-ODT) 4 MG disintegrating tablet, Take 1 tablet (4 mg total) by mouth every 8 (eight) hours as needed for nausea or vomiting., Disp: 20 tablet, Rfl: 0   promethazine-dextromethorphan (PROMETHAZINE-DM) 6.25-15 MG/5ML syrup, Take 5 mLs by mouth 4 (four) times daily as needed for cough., Disp: 120 mL, Rfl: 0   terbinafine (LAMISIL) 1 % cream, Apply 1 Application topically 2 (two) times daily., Disp: 30 g, Rfl: 2   zolmitriptan (ZOMIG-ZMT) 5 MG disintegrating tablet, Take 1 tablet (5 mg total) by mouth once as needed for up to 1 dose for migraine., Disp: 10 tablet, Rfl: 0  Observations/Objective: Patient is well-developed, well-nourished in no acute distress.  Resting comfortably  Head is normocephalic, atraumatic.  No labored breathing.  Speech is clear and coherent with logical content.  Patient is alert and oriented at baseline.  Sunburn with blisters noted most severe on left forearm (dorsal) and right shoulder  Assessment and Plan: 1. Sunburn of second degree - predniSONE (DELTASONE) 20 MG tablet; Take 2 tablets (40 mg total) by mouth daily with breakfast.  Dispense: 10 tablet; Refill: 0  - Prednisone for swelling and itching - Use aloe with lidocaine to numb pain if needed - Moisturize skin well - Tylenol if fevers develop - Cool showers - Cold compresses - Push fluids - Try not to scratch, pick, peel skin - Seek in person evaluation if worsening  Follow Up Instructions: I discussed the assessment and treatment plan with the patient. The patient was provided an opportunity to ask questions and all were answered. The patient agreed with the plan and demonstrated an understanding of the  instructions.  A copy of instructions were sent to the patient via MyChart unless otherwise noted below.    The patient was advised to call back or seek an in-person evaluation if the symptoms worsen or if the condition fails to improve as anticipated.  Time:  I spent 8 minutes with the patient via telehealth technology discussing the above problems/concerns.    [name], PA-C

## 2023-02-28 NOTE — Patient Instructions (Signed)
Sabrina Brennan, thank you for joining [name], PA-C for today's virtual visit.  While this provider is not your primary care provider (PCP), if your PCP is located in our provider database this encounter information will be shared with them immediately following your visit.   A [facility] MyChart account gives you access to today's visit and all your visits, tests, and labs performed at [facility] " click here if you don't have a [facility] MyChart account or go to [email or web address]  Consent: (Patient) Sabrina Brennan provided verbal consent for this virtual visit at the beginning of the encounter.  Current Medications:  Current Outpatient Medications:    predniSONE (DELTASONE) 20 MG tablet, Take 2 tablets (40 mg total) by mouth daily with breakfast., Disp: 10 tablet, Rfl: 0   scopolamine (TRANSDERM-SCOP) 1 MG/3DAYS, Place 1 patch (1.5 mg total) onto the skin every 3 (three) days., Disp: 4 patch, Rfl: 0   Azelastine-Fluticasone 137-50 MCG/ACT SUSP, Place 1 spray into the nose every 12 (twelve) hours., Disp: 23 g, Rfl: 0   baclofen (LIORESAL) 10 MG tablet, Take 0.5 tablets (5 mg total) by mouth 3 (three) times daily., Disp: 15 each, Rfl: 0   docusate sodium (COLACE) 100 MG capsule, Take 1 capsule (100 mg total) by mouth every 12 (twelve) hours., Disp: 60 capsule, Rfl: 0   hydrocortisone 1 % ointment, Apply 1 Application topically 2 (two) times daily., Disp: 30 g, Rfl: 0   ketoconazole (NIZORAL) 2 % shampoo, Apply 1 Application topically 2 (two) times a week., Disp: 120 mL, Rfl: 2   loperamide (IMODIUM) 2 MG capsule, Take 2 capsules (4 mg total) by mouth as needed for diarrhea or loose stools., Disp: 30 capsule, Rfl: 0   metroNIDAZOLE (FLAGYL) 500 MG tablet, Take 1 tablet (500 mg total) by mouth 2 (two) times daily., Disp: 14 tablet, Rfl: 0   naproxen (NAPROSYN) 375 MG tablet, Take 1 tablet (375 mg total) by mouth 2 (two) times daily with a meal., Disp: 30  tablet, Rfl: 0   ondansetron (ZOFRAN-ODT) 4 MG disintegrating tablet, Take 1 tablet (4 mg total) by mouth every 8 (eight) hours as needed for nausea or vomiting., Disp: 20 tablet, Rfl: 0   promethazine-dextromethorphan (PROMETHAZINE-DM) 6.25-15 MG/5ML syrup, Take 5 mLs by mouth 4 (four) times daily as needed for cough., Disp: 120 mL, Rfl: 0   terbinafine (LAMISIL) 1 % cream, Apply 1 Application topically 2 (two) times daily., Disp: 30 g, Rfl: 2   zolmitriptan (ZOMIG-ZMT) 5 MG disintegrating tablet, Take 1 tablet (5 mg total) by mouth once as needed for up to 1 dose for migraine., Disp: 10 tablet, Rfl: 0   Medications ordered in this encounter:  Meds ordered this encounter  Medications   predniSONE (DELTASONE) 20 MG tablet    Sig: Take 2 tablets (40 mg total) by mouth daily with breakfast.    Dispense:  10 tablet    Refill:  0    Order Specific Question:   Supervising Provider    Answer:   Merrilee Jansky [id number]     *If you need refills on other medications prior to your next appointment, please contact your pharmacy*  Follow-Up: Call back or seek an in-person evaluation if the symptoms worsen or if the condition fails to improve as anticipated.  [facility] [phone]  Other Instructions Sunburn, Adult  Sunburn is damage to the skin that is caused by too much exposure to ultraviolet (UV) rays. Repeated, prolonged  sun exposure causes signs of early skin aging, such as wrinkles and sun spots. It also increases the risk of skin cancer. What are the causes? Sunburn is caused by getting too much UV radiation from the sun, sunlamps, or tanning beds. What increases the risk? The following factors may make you more likely to develop this condition: Having light-colored skin (fair complexion), skin with many freckles or moles, or skin that tends to burn instead of tan. Having fair or red hair. Having blue or green eyes. Other factors include: Living in an area with  strong sun exposure. Having a family history of sensitivity to the sun or a family history of skin cancer. Having a body defense system (immune system) that does not work properly because of certain diseases (such as lupus) or certain drugs. Taking certain medicines that cause you to be sensitive to sunlight (have photosensitivity). What are the signs or symptoms? Symptoms of this condition include: Red or pink skin. Soreness and swelling of the skin in the affected areas. Pain. Blisters. Peeling skin. If the sunburn is severe, you may also have a headache, fever, nausea, dizziness, or fatigue. How is this diagnosed? This condition is diagnosed with a medical history and physical exam. How is this treated? Mild or moderate sunburns can often be managed with self-care strategies, including: Cool baths or cool, wet cloths (cool compresses). Moisturizer or aloe for pain relief. Over-the-counter pain relievers. Drinking extra water to replace lost fluids and to prevent dehydration. A severe sunburn may require: Antibiotic medicines if there is an associated infection. IV fluids. Follow these instructions at home: Medicines Take or apply over-the-counter and prescription medicines only as told by your health care provider. If you were prescribed an antibiotic medicine, use it as told by your health care provider. Do not stop using the antibiotic even if your condition improves. General instructions Avoid further exposure to the sun. Protect sunburned skin by wearing clothing that covers the injured skin. Do not put ice on your sunburn. This can cause further damage. Try taking a cool bath or applying a cool compress to your skin. This may help with pain. Drink enough fluid to keep your urine pale yellow. Try applying aloe vera or a moisturizer that has soy in it to your sunburn. This may help. Do not apply aloe vera or moisturizer with soy if your sunburn has blisters. Do not break any  blisters that you may have. Keep all follow-up visits. This is important. How is this prevented?  Try to avoid the sun between 10 a.m. and 4 p.m. The sun is strongest during those hours. Apply sunscreen 15-30 minutes before you will be out in the sun. Apply a sunscreen with an SPF of 30 or higher. Consider using an SPF of 30 or higher if you will be exposed to the sun for prolonged periods of time. Use a sunscreen that protects against all of the sun's rays (broad-spectrum) and is water-resistant. Reapply sunscreen: About every 2 hours during sun exposure. More often when sweating a lot while out in the sun. After getting wet from swimming or playing in water. When you are outside, wear long sleeves, a hat, and sunglasses that block UV light. Talk with your health care provider about medicines, herbs, and foods that can make you more sensitive to light. Avoid these, if possible. Do not use tanning beds. Contact a health care provider if: You have a fever or chills. Your symptoms do not improve with treatment. Your pain is  not controlled with medicine. Your burn becomes more painful or swollen. You develop open blisters. Get help right away if: You are dizzy or you pass out. You have a severe headache or you feel confused. You vomit or have diarrhea. You develop severe blistering. You have pus or fluid coming from the blisters. These symptoms may represent a serious problem that is an emergency. Do not wait to see if the symptoms will go away. Get medical help right away. Call your local emergency services (911 in the [place] Do not drive yourself to the hospital. Summary Sunburn is caused by getting too much ultraviolet (UV) radiation from the sun, sunlamps, or tanning beds. People with light-colored skin (fair complexion) have an increased risk of sunburn. Mild or moderate sunburns can often be managed with self-care strategies, including cool baths or cool cloths (compresses). To help  prevent sunburn, apply sunscreen 15-30 minutes or more before you will be exposed to the sun. This information is not intended to replace advice given to you by your health care provider. Make sure you discuss any questions you have with your health care provider. Document Revised: 10/22/2020 Document Reviewed: 10/22/2020 Elsevier Patient Education  2024 Elsevier Inc.    If you have been instructed to have an in-person evaluation today at a local Urgent Care facility, please use the link below. It will take you to a list of all of our available [facility], including address, phone number and hours of operation. Please do not delay care.  [facility]  If you or a family member do not have a primary care provider, use the link below to schedule a visit and establish care. When you choose a [facility] primary care physician or advanced practice provider, you gain a long-term partner in health. Find a Primary Care Provider  Learn more about [facility] in-office and virtual care options: [facility] - Get Care Now

## 2023-03-07 ENCOUNTER — Ambulatory Visit: Payer: BC Managed Care – PPO

## 2023-03-07 DIAGNOSIS — Z3042 Encounter for surveillance of injectable contraceptive: Secondary | ICD-10-CM | POA: Diagnosis not present

## 2023-03-07 MED ORDER — MEDROXYPROGESTERONE ACETATE 150 MG/ML IM SUSY
150.0000 mg | PREFILLED_SYRINGE | Freq: Once | INTRAMUSCULAR | Status: AC
Start: 2023-03-07 — End: 2023-03-07
  Administered 2023-03-07: 150 mg via INTRAMUSCULAR

## 2023-03-07 NOTE — Progress Notes (Signed)
Patient here today for Depo Provera injection and is within her dates.    Last contraceptive appt was 09/24/22  Depo given in [facility]  today.  Site unremarkable & patient tolerated injection.    Next injection due 05/23/2023-06/06/2023.  Reminder card given.    Veronda Prude, RN

## 2023-03-17 ENCOUNTER — Telehealth: Payer: BC Managed Care – PPO | Admitting: Nurse Practitioner

## 2023-03-17 DIAGNOSIS — J069 Acute upper respiratory infection, unspecified: Secondary | ICD-10-CM

## 2023-03-17 NOTE — Progress Notes (Signed)
Virtual Visit Consent   Sabrina Brennan, you are scheduled for a virtual visit with a Portal provider today. Just as with appointments in the office, your consent must be obtained to participate. Your consent will be active for this visit and any virtual visit you may have with one of our providers in the next 365 days. If you have a MyChart account, a copy of this consent can be sent to you electronically.  As this is a virtual visit, video technology does not allow for your provider to perform a traditional examination. This may limit your provider's ability to fully assess your condition. If your provider identifies any concerns that need to be evaluated in person or the need to arrange testing (such as labs, EKG, etc.), we will make arrangements to do so. Although advances in technology are sophisticated, we cannot ensure that it will always work on either your end or our end. If the connection with a video visit is poor, the visit may have to be switched to a telephone visit. With either a video or telephone visit, we are not always able to ensure that we have a secure connection.  By engaging in this virtual visit, you consent to the provision of healthcare and authorize for your insurance to be billed (if applicable) for the services provided during this visit. Depending on your insurance coverage, you may receive a charge related to this service.  I need to obtain your verbal consent now. Are you willing to proceed with your visit today? Sabrina Brennan has provided verbal consent on 03/17/2023 for a virtual visit (video or telephone). Viviano Simas, FNP  Date: 03/17/2023 8:22 AM  Virtual Visit via Video Note   I, Viviano Simas, [name]  ([id number], June 18, 1991) on 03/17/23 at  8:30 AM EDT by a video-enabled telemedicine application and verified that I am speaking with the correct person using two identifiers.  Location: Patient: Virtual Visit Location Patient:  Home Provider: Virtual Visit Location Provider: Home Office   I discussed the limitations of evaluation and management by telemedicine and the availability of in person appointments. The patient expressed understanding and agreed to proceed.    History of Present Illness: Sabrina Brennan is a 32 y.o. 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/29/2020   History of abnormal cervical Pap smear 08/28/2020   High grade squamous intraepithelial lesion (HGSIL), grade 3 CIN, 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    Allergies: No Known Allergies Medications:  Current Outpatient Medications:    scopolamine (TRANSDERM-SCOP) 1 MG/3DAYS, Place 1 patch (1.5 mg total) onto the skin every 3 (three) days., Disp: 4 patch, Rfl: 0   Azelastine-Fluticasone 137-50 MCG/ACT SUSP, Place 1 spray into the nose every 12 (twelve) hours., Disp: 23 g, Rfl: 0   baclofen (  LIORESAL) 10 MG tablet, Take 0.5 tablets (5 mg total) by mouth 3 (three) times daily., Disp: 15 each, Rfl: 0   docusate sodium (COLACE) 100 MG capsule, Take 1 capsule (100 mg total) by mouth every 12 (twelve) hours., Disp: 60 capsule, Rfl: 0   hydrocortisone 1 % ointment, Apply 1 Application topically 2 (two) times daily., Disp: 30 g, Rfl: 0   ketoconazole (NIZORAL) 2 % shampoo, Apply 1 Application topically 2 (two) times a week., Disp: 120 mL, Rfl: 2    loperamide (IMODIUM) 2 MG capsule, Take 2 capsules (4 mg total) by mouth as needed for diarrhea or loose stools., Disp: 30 capsule, Rfl: 0   metroNIDAZOLE (FLAGYL) 500 MG tablet, Take 1 tablet (500 mg total) by mouth 2 (two) times daily., Disp: 14 tablet, Rfl: 0   naproxen (NAPROSYN) 375 MG tablet, Take 1 tablet (375 mg total) by mouth 2 (two) times daily with a meal., Disp: 30 tablet, Rfl: 0   ondansetron (ZOFRAN-ODT) 4 MG disintegrating tablet, Take 1 tablet (4 mg total) by mouth every 8 (eight) hours as needed for nausea or vomiting., Disp: 20 tablet, Rfl: 0   predniSONE (DELTASONE) 20 MG tablet, Take 2 tablets (40 mg total) by mouth daily with breakfast., Disp: 10 tablet, Rfl: 0   promethazine-dextromethorphan (PROMETHAZINE-DM) 6.25-15 MG/5ML syrup, Take 5 mLs by mouth 4 (four) times daily as needed for cough., Disp: 120 mL, Rfl: 0   terbinafine (LAMISIL) 1 % cream, Apply 1 Application topically 2 (two) times daily., Disp: 30 g, Rfl: 2   zolmitriptan (ZOMIG-ZMT) 5 MG disintegrating tablet, Take 1 tablet (5 mg total) by mouth once as needed for up to 1 dose for migraine., Disp: 10 tablet, Rfl: 0  Observations/Objective: Patient is well-developed, well-nourished in no acute distress.  Resting comfortably  at home.  Head is normocephalic, atraumatic.  No labored breathing.  Speech is clear and coherent with logical content.  Patient is alert and oriented at baseline.    Assessment and Plan:  1. Viral upper respiratory tract infection  Continue to manage with over the counter medications  Cold/flu medications  Ibuprofen for pain relief  Sore throat lozenges   May consider taking a home COVID test   Follow up if symptoms persist or with new concerns      Follow Up Instructions: I discussed the assessment and treatment plan with the patient. The patient was provided an opportunity to ask questions and all were answered. The patient agreed with the plan and demonstrated an  understanding of the instructions.  A copy of instructions were sent to the patient via MyChart unless otherwise noted below.    The patient was advised to call back or seek an in-person evaluation if the symptoms worsen or if the condition fails to improve as anticipated.  Time:  I spent 12 minutes with the patient via telehealth technology discussing the above problems/concerns.    Viviano Simas, FNP

## 2023-03-19 ENCOUNTER — Ambulatory Visit
Admission: EM | Admit: 2023-03-19 | Discharge: 2023-03-19 | Disposition: A | Discharge status: Home / Self Care | Payer: BC Managed Care – PPO | Attending: Internal Medicine | Admitting: Internal Medicine

## 2023-03-19 DIAGNOSIS — B349 Viral infection, unspecified: Secondary | ICD-10-CM | POA: Insufficient documentation

## 2023-03-19 DIAGNOSIS — H109 Unspecified conjunctivitis: Secondary | ICD-10-CM | POA: Insufficient documentation

## 2023-03-19 DIAGNOSIS — R070 Pain in throat: Secondary | ICD-10-CM | POA: Diagnosis not present

## 2023-03-19 LAB — POCT RAPID STREP A (OFFICE): Rapid Strep A Screen: NEGATIVE

## 2023-03-19 MED ORDER — CETIRIZINE HCL 10 MG PO TABS: 10.0000 mg | ORAL_TABLET | Freq: Every day | ORAL | 0 refills | Status: DC

## 2023-03-19 MED ORDER — TOBRAMYCIN 0.3 % OP SOLN: 1.0000 [drp] | OPHTHALMIC | 0 refills | Status: DC

## 2023-03-19 MED ORDER — PROMETHAZINE-DM 6.25-15 MG/5ML PO SYRP: 5.0000 mL | ORAL_SOLUTION | Freq: Three times a day (TID) | ORAL | 0 refills | Status: DC | PRN

## 2023-03-19 MED ORDER — PSEUDOEPHEDRINE HCL 60 MG PO TABS: 60.0000 mg | ORAL_TABLET | Freq: Three times a day (TID) | ORAL | 0 refills | Status: DC | PRN

## 2023-03-19 NOTE — Discharge Instructions (Signed)
We will notify you of your test results as they arrive and may take between about 24 hours.  I encourage you to sign up for MyChart if you have not already done so as this can be the easiest way for [place] to communicate results to you online or through a phone app.  Generally, we only contact you if it is a positive test result.  In the meantime, if you develop worsening symptoms including fever, chest pain, shortness of breath despite our current treatment plan then please report to the emergency room as this may be a sign of worsening status from possible viral infection.  Otherwise, we will manage this as a viral syndrome. For sore throat or cough try using a honey-based tea. Use 3 teaspoons of honey with juice squeezed from half lemon. Place shaved pieces of ginger into 1/2-1 cup of water and warm over stove top. Then mix the ingredients and repeat every 4 hours as needed. Please take Tylenol 500mg -650mg  every 6 hours for aches and pains, fevers. Hydrate very well with at least 2 liters of water. Eat light meals such as soups to replenish electrolytes and soft fruits, veggies. Start an antihistamine like Zyrtec (10mg  daily) for postnasal drainage, sinus congestion.  You can take this together with pseudoephedrine (Sudafed) at a dose of 60 mg 2-3 times a day as needed for the same kind of congestion.  Use the cough medications as needed. Use tobramycin eye drops for bacterial conjunctivitis (pink eye) of the right eye.

## 2023-03-19 NOTE — ED Triage Notes (Signed)
Pt c/o head/chest congestion, cough x 4 days-woke this am with redness, burning to right eye-NAD-steady gait

## 2023-03-19 NOTE — ED Provider Notes (Signed)
[facility]  Note:  This document was prepared using Conservation officer, historic buildings and may include unintentional dictation errors.  MRN: [id number] DOB: 03/25/1991  Subjective:   Sabrina Brennan is a 32 y.o. female presenting for 4 day history of acute onset head congestion, chest congestion, productive cough, now having difficulty with right eye redness, irritation and drainage.  No chest pain, shortness of breath or wheezing.  No history of respiratory disorders.  No smoking of any kind including cigarettes, cigars, vaping, marijuana use.  Patient has done 2 COVID test at home and both were negative.  Declines a PCR test through our clinic.   No chronic medications.    No Known Allergies  Past Medical History:  Diagnosis Date   Medical history non-contributory    Migraine      Past Surgical History:  Procedure Laterality Date   EXCISION MASS LOWER EXTREMETIES Left 12/11/2019   Procedure: EXCISION LEFT THIGH CYST;  Surgeon: Harriette Bouillon, MD;  Location: [facility];  Service: General;  Laterality: Left;   LIPOSUCTION MULTIPLE BODY PARTS      Family History  Problem Relation Age of Onset   Healthy Mother    Healthy Father    Breast cancer Maternal Grandmother    Hypertension Maternal Grandmother    Pancreatic cancer Maternal Grandfather    Migraines Maternal Grandfather    Breast cancer Other        Maternal aunt    Social History   Tobacco Use   Smoking status: Never    Passive exposure: Never   Smokeless tobacco: Never   Tobacco comments:    Hooka 2x monthly  Vaping Use   Vaping status: Never Used  Substance Use Topics   Alcohol use: Yes    Comment: occ   Drug use: No    ROS   Objective:   Vitals: BP 119/87 (BP Location: Right Arm)   Pulse 82   Temp 98 F (36.7 C) (Oral)   Resp 20   SpO2 96%   Physical Exam Constitutional:      General: She is not in acute distress.    Appearance: Normal appearance.  She is well-developed and normal weight. She is not ill-appearing, toxic-appearing or diaphoretic.  HENT:     Head: Normocephalic and atraumatic.     Right Ear: Tympanic membrane, ear canal and external ear normal. No drainage or tenderness. No middle ear effusion. There is no impacted cerumen. Tympanic membrane is not erythematous or bulging.     Left Ear: Tympanic membrane, ear canal and external ear normal. No drainage or tenderness.  No middle ear effusion. There is no impacted cerumen. Tympanic membrane is not erythematous or bulging.     Nose: Congestion present. No rhinorrhea.     Mouth/Throat:     Mouth: Mucous membranes are moist. No oral lesions.     Pharynx: No pharyngeal swelling, oropharyngeal exudate, posterior oropharyngeal erythema or uvula swelling.     Tonsils: No tonsillar exudate or tonsillar abscesses.  Eyes:     General: Lids are normal. Lids are everted, no foreign bodies appreciated. Vision grossly intact. No scleral icterus.       Right eye: No foreign body, discharge or hordeolum.        Left eye: No foreign body, discharge or hordeolum.     Extraocular Movements: Extraocular movements intact.     Right eye: Normal extraocular motion.     Left eye: Normal extraocular motion  and no nystagmus.     Conjunctiva/sclera:     Right eye: Right conjunctiva is injected (with associated drainage medially). No chemosis, exudate or hemorrhage.    Left eye: Left conjunctiva is not injected. No chemosis, exudate or hemorrhage. Cardiovascular:     Rate and Rhythm: Normal rate and regular rhythm.     Heart sounds: Normal heart sounds. No murmur heard.    No friction rub. No gallop.  Pulmonary:     Effort: Pulmonary effort is normal. No respiratory distress.     Breath sounds: No stridor. No wheezing, rhonchi or rales.  Chest:     Chest wall: No tenderness.  Musculoskeletal:     Cervical back: Normal range of motion and neck supple.  Lymphadenopathy:     Cervical: No cervical  adenopathy.  Skin:    General: Skin is warm and dry.  Neurological:     General: No focal deficit present.     Mental Status: She is alert and oriented to person, place, and time.  Psychiatric:        Mood and Affect: Mood normal.        Behavior: Behavior normal.    Results for orders placed or performed during the hospital encounter of 03/19/23 (from the past 24 hour(s))  POCT rapid strep A     Status: None   Collection Time: 03/19/23 10:07 AM  Result Value Ref Range   Rapid Strep A Screen Negative Negative    Assessment and Plan :   PDMP not reviewed this encounter.  1. Acute viral syndrome   2. Throat pain   3. Bacterial conjunctivitis of right eye    Deferred imaging given clear cardiopulmonary exam, hemodynamically stable vital signs. Will start tobramycin to address bacterial conjunctivitis of the right eye.  Strep culture pending, suspect viral URI, viral syndrome. Physical exam findings reassuring and vital signs stable for discharge. Advised supportive care, offered symptomatic relief. Counseled patient on potential for adverse effects with medications prescribed/recommended today, ER and return-to-clinic precautions discussed, patient verbalized understanding.     Wallis Bamberg, [place] 03/19/23 1015

## 2023-03-22 LAB — CULTURE, GROUP A STREP (THRC)

## 2023-04-28 ENCOUNTER — Telehealth: Payer: BC Managed Care – PPO | Admitting: Physician Assistant

## 2023-04-28 DIAGNOSIS — Z77120 Contact with and (suspected) exposure to mold (toxic): Secondary | ICD-10-CM

## 2023-04-28 DIAGNOSIS — R051 Acute cough: Secondary | ICD-10-CM | POA: Diagnosis not present

## 2023-04-28 MED ORDER — PREDNISONE 20 MG PO TABS
40.0000 mg | ORAL_TABLET | Freq: Every day | ORAL | 0 refills | Status: DC
Start: 2023-04-28 — End: 2023-07-04

## 2023-04-28 MED ORDER — PROMETHAZINE-DM 6.25-15 MG/5ML PO SYRP
5.0000 mL | ORAL_SOLUTION | Freq: Four times a day (QID) | ORAL | 0 refills | Status: DC | PRN
Start: 2023-04-28 — End: 2023-07-04

## 2023-04-28 NOTE — Patient Instructions (Signed)
Sabrina Brennan, thank you for joining [name], PA-C for today's virtual visit.  While this provider is not your primary care provider (PCP), if your PCP is located in our provider database this encounter information will be shared with them immediately following your visit.   A [facility] MyChart account gives you access to today's visit and all your visits, tests, and labs performed at [facility] " click here if you don't have a [facility] MyChart account or go to [email or web address]  Consent: (Patient) Sabrina Brennan provided verbal consent for this virtual visit at the beginning of the encounter.  Current Medications:  Current Outpatient Medications:    promethazine-dextromethorphan (PROMETHAZINE-DM) 6.25-15 MG/5ML syrup, Take 5 mLs by mouth 4 (four) times daily as needed., Disp: 118 mL, Rfl: 0   scopolamine (TRANSDERM-SCOP) 1 MG/3DAYS, Place 1 patch (1.5 mg total) onto the skin every 3 (three) days., Disp: 4 patch, Rfl: 0   Azelastine-Fluticasone 137-50 MCG/ACT SUSP, Place 1 spray into the nose every 12 (twelve) hours., Disp: 23 g, Rfl: 0   baclofen (LIORESAL) 10 MG tablet, Take 0.5 tablets (5 mg total) by mouth 3 (three) times daily., Disp: 15 each, Rfl: 0   cetirizine (ZYRTEC ALLERGY) 10 MG tablet, Take 1 tablet (10 mg total) by mouth daily., Disp: 30 tablet, Rfl: 0   docusate sodium (COLACE) 100 MG capsule, Take 1 capsule (100 mg total) by mouth every 12 (twelve) hours., Disp: 60 capsule, Rfl: 0   hydrocortisone 1 % ointment, Apply 1 Application topically 2 (two) times daily., Disp: 30 g, Rfl: 0   ketoconazole (NIZORAL) 2 % shampoo, Apply 1 Application topically 2 (two) times a week., Disp: 120 mL, Rfl: 2   loperamide (IMODIUM) 2 MG capsule, Take 2 capsules (4 mg total) by mouth as needed for diarrhea or loose stools., Disp: 30 capsule, Rfl: 0   metroNIDAZOLE (FLAGYL) 500 MG tablet, Take 1 tablet (500 mg total) by mouth 2 (two) times daily., Disp: 14  tablet, Rfl: 0   naproxen (NAPROSYN) 375 MG tablet, Take 1 tablet (375 mg total) by mouth 2 (two) times daily with a meal., Disp: 30 tablet, Rfl: 0   ondansetron (ZOFRAN-ODT) 4 MG disintegrating tablet, Take 1 tablet (4 mg total) by mouth every 8 (eight) hours as needed for nausea or vomiting., Disp: 20 tablet, Rfl: 0   predniSONE (DELTASONE) 20 MG tablet, Take 2 tablets (40 mg total) by mouth daily with breakfast., Disp: 10 tablet, Rfl: 0   pseudoephedrine (SUDAFED) 60 MG tablet, Take 1 tablet (60 mg total) by mouth every 8 (eight) hours as needed for congestion., Disp: 30 tablet, Rfl: 0   terbinafine (LAMISIL) 1 % cream, Apply 1 Application topically 2 (two) times daily., Disp: 30 g, Rfl: 2   tobramycin (TOBREX) 0.3 % ophthalmic solution, Place 1 drop into the right eye every 4 (four) hours., Disp: 5 mL, Rfl: 0   zolmitriptan (ZOMIG-ZMT) 5 MG disintegrating tablet, Take 1 tablet (5 mg total) by mouth once as needed for up to 1 dose for migraine., Disp: 10 tablet, Rfl: 0   Medications ordered in this encounter:  Meds ordered this encounter  Medications   predniSONE (DELTASONE) 20 MG tablet    Sig: Take 2 tablets (40 mg total) by mouth daily with breakfast.    Dispense:  10 tablet    Refill:  0    Order Specific Question:   Supervising Provider    Answer:   Merrilee Jansky [[id number]]  promethazine-dextromethorphan (PROMETHAZINE-DM) 6.25-15 MG/5ML syrup    Sig: Take 5 mLs by mouth 4 (four) times daily as needed.    Dispense:  118 mL    Refill:  0    Order Specific Question:   Supervising Provider    Answer:   Merrilee Jansky [[id number]]     *If you need refills on other medications prior to your next appointment, please contact your pharmacy*  Follow-Up: Call back or seek an in-person evaluation if the symptoms worsen or if the condition fails to improve as anticipated.  [facility] [phone]  Other Instructions Cough, Adult Coughing is a reflex that clears your  throat and airways (respiratory system). It helps heal and protect your lungs. It is normal to cough from time to time. A cough that happens with other symptoms or that lasts a long time may be a sign of a condition that needs treatment. A short-term (acute) cough may only last 2-3 weeks. A long-term (chronic) cough may last 8 or more weeks. Coughing is often caused by: Diseases, such as: An infection of the respiratory system. Asthma or other heart or lung diseases. Gastroesophageal reflux. This is when acid comes back up from the stomach. Breathing in things that irritate your lungs. Allergies. Postnasal drip. This is when mucus runs down the back of your throat. Smoking. Some medicines. Follow these instructions at home: Medicines Take over-the-counter and prescription medicines only as told by your health care provider. Talk with your provider before you take cough medicine (cough suppressants). Eating and drinking Do not drink alcohol. Avoid caffeine. Drink enough fluid to keep your pee (urine) pale yellow. Lifestyle Avoid cigarette smoke. Do not use any products that contain nicotine or tobacco. These products include cigarettes, chewing tobacco, and vaping devices, such as e-cigarettes. If you need help quitting, ask your provider. Avoid things that make you cough. These may include perfumes, candles, cleaning products, or campfire smoke. General instructions  Watch for any changes to your cough. Tell your provider about them. Always cover your mouth when you cough. If the air is dry in your bedroom or home, use a cool mist vaporizer or humidifier. If your cough is worse at night, try to sleep in a semi-upright position. Rest as needed. Contact a health care provider if: You have new symptoms, or your symptoms get worse. You cough up pus. You have a fever that does not go away or a cough that does not get better after 2-3 weeks. You cannot control your cough with medicine,  and you are losing sleep. You have pain that gets worse or is not helped with medicine. You lose weight for no clear reason. You have night sweats. Get help right away if: You cough up blood. You have trouble breathing. Your heart is beating very fast. These symptoms may be an emergency. Get help right away. Call 911. Do not wait to see if the symptoms will go away. Do not drive yourself to the hospital. This information is not intended to replace advice given to you by your health care provider. Make sure you discuss any questions you have with your health care provider. Document Revised: 03/19/2022 Document Reviewed: 03/19/2022 Elsevier Patient Education  2024 Elsevier Inc.    If you have been instructed to have an in-person evaluation today at a local Urgent Care facility, please use the link below. It will take you to a list of all of our available [facility], including address, phone  number and hours of operation. Please do not delay care.  [facility]  If you or a family member do not have a primary care provider, use the link below to schedule a visit and establish care. When you choose a [facility] primary care physician or advanced practice provider, you gain a long-term partner in health. Find a Primary Care Provider  Learn more about [facility]'s in-office and virtual care options: [facility] - Get Care Now

## 2023-04-28 NOTE — Progress Notes (Signed)
Virtual Visit Consent   [name], you are scheduled for a virtual visit with a [facility] provider today. Just as with appointments in the office, your consent must be obtained to participate. Your consent will be active for this visit and any virtual visit you may have with one of our providers in the next 365 days. If you have a MyChart account, a copy of this consent can be sent to you electronically.  As this is a virtual visit, video technology does not allow for your provider to perform a traditional examination. This may limit your provider's ability to fully assess your condition. If your provider identifies any concerns that need to be evaluated in person or the need to arrange testing (such as labs, EKG, etc.), we will make arrangements to do so. Although advances in technology are sophisticated, we cannot ensure that it will always work on either your end or our end. If the connection with a video visit is poor, the visit may have to be switched to a telephone visit. With either a video or telephone visit, we are not always able to ensure that we have a secure connection.  By engaging in this virtual visit, you consent to the provision of healthcare and authorize for your insurance to be billed (if applicable) for the services provided during this visit. Depending on your insurance coverage, you may receive a charge related to this service.  I need to obtain your verbal consent now. Are you willing to proceed with your visit today? Sabrina Brennan has provided verbal consent on 04/28/2023 for a virtual visit (video or telephone). [name], PA-C  Date: 04/28/2023 6:19 PM  Virtual Visit via Video Note   I, [name], [name]  ([id number], 02-Aug-1991) on 04/28/23 at  6:15 PM EDT by a video-enabled telemedicine application and verified that I am speaking with the correct person using two identifiers.  Location: Patient: Virtual Visit Location  Patient: Home Provider: Virtual Visit Location Provider: Home Office   I discussed the limitations of evaluation and management by telemedicine and the availability of in person appointments. The patient expressed understanding and agreed to proceed.    History of Present Illness: Sabrina Brennan is a 32 y.o. who identifies as a female who was assigned female at birth, and is being seen today for cough.  HPI: Cough This is a new problem. The current episode started 1 to 4 weeks ago (Symptoms intermittent off and on since August 2024; found mold in new apartment and have been told to not stay there). The problem has been gradually worsening. The problem occurs constantly. The cough is Productive of sputum and productive of purulent sputum. Associated symptoms include headaches, nasal congestion, postnasal drip, rhinorrhea and shortness of breath. Pertinent negatives include no chills, sore throat or wheezing. The symptoms are aggravated by other (mold). She has tried rest for the symptoms. The treatment provided no relief.     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/29/2020   History of abnormal cervical Pap smear 08/28/2020   High grade squamous intraepithelial lesion (HGSIL), grade 3 CIN, 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    Allergies: No Known Allergies Medications:  Current Outpatient Medications:  promethazine-dextromethorphan (PROMETHAZINE-DM) 6.25-15 MG/5ML syrup, Take 5 mLs by mouth 4 (four) times daily as needed., Disp: 118 mL, Rfl: 0   scopolamine (TRANSDERM-SCOP) 1 MG/3DAYS, Place 1 patch (1.5 mg total) onto the skin every 3 (three) days., Disp: 4 patch, Rfl: 0   Azelastine-Fluticasone 137-50 MCG/ACT SUSP, Place 1 spray into the nose  every 12 (twelve) hours., Disp: 23 g, Rfl: 0   baclofen (LIORESAL) 10 MG tablet, Take 0.5 tablets (5 mg total) by mouth 3 (three) times daily., Disp: 15 each, Rfl: 0   cetirizine (ZYRTEC ALLERGY) 10 MG tablet, Take 1 tablet (10 mg total) by mouth daily., Disp: 30 tablet, Rfl: 0   docusate sodium (COLACE) 100 MG capsule, Take 1 capsule (100 mg total) by mouth every 12 (twelve) hours., Disp: 60 capsule, Rfl: 0   hydrocortisone 1 % ointment, Apply 1 Application topically 2 (two) times daily., Disp: 30 g, Rfl: 0   ketoconazole (NIZORAL) 2 % shampoo, Apply 1 Application topically 2 (two) times a week., Disp: 120 mL, Rfl: 2   loperamide (IMODIUM) 2 MG capsule, Take 2 capsules (4 mg total) by mouth as needed for diarrhea or loose stools., Disp: 30 capsule, Rfl: 0   metroNIDAZOLE (FLAGYL) 500 MG tablet, Take 1 tablet (500 mg total) by mouth 2 (two) times daily., Disp: 14 tablet, Rfl: 0   naproxen (NAPROSYN) 375 MG tablet, Take 1 tablet (375 mg total) by mouth 2 (two) times daily with a meal., Disp: 30 tablet, Rfl: 0   ondansetron (ZOFRAN-ODT) 4 MG disintegrating tablet, Take 1 tablet (4 mg total) by mouth every 8 (eight) hours as needed for nausea or vomiting., Disp: 20 tablet, Rfl: 0   predniSONE (DELTASONE) 20 MG tablet, Take 2 tablets (40 mg total) by mouth daily with breakfast., Disp: 10 tablet, Rfl: 0   pseudoephedrine (SUDAFED) 60 MG tablet, Take 1 tablet (60 mg total) by mouth every 8 (eight) hours as needed for congestion., Disp: 30 tablet, Rfl: 0   terbinafine (LAMISIL) 1 % cream, Apply 1 Application topically 2 (two) times daily., Disp: 30 g, Rfl: 2   tobramycin (TOBREX) 0.3 % ophthalmic solution, Place 1 drop into the right eye every 4 (four) hours., Disp: 5 mL, Rfl: 0   zolmitriptan (ZOMIG-ZMT) 5 MG disintegrating tablet, Take 1 tablet (5 mg total) by mouth once as needed for up to 1 dose for migraine., Disp: 10 tablet, Rfl: 0  Observations/Objective: Patient is well-developed, well-nourished in  no acute distress.  Resting comfortably at home.  Head is normocephalic, atraumatic.  No labored breathing.  Speech is clear and coherent with logical content.  Patient is alert and oriented at baseline.    Assessment and Plan: 1. Mold exposure - predniSONE (DELTASONE) 20 MG tablet; Take 2 tablets (40 mg total) by mouth daily with breakfast.  Dispense: 10 tablet; Refill: 0 - promethazine-dextromethorphan (PROMETHAZINE-DM) 6.25-15 MG/5ML syrup; Take 5 mLs by mouth 4 (four) times daily as needed.  Dispense: 118 mL; Refill: 0  - Mold found in new apartment, she has been able to get out of the apartment at this time - Prednisone for acute respiratory symptoms - Promethazine DM for cough - Continue Cetirizine - Push fluids - Rest - Seek further evaluation if symptoms change or worsen   Follow Up Instructions: I discussed the assessment and treatment plan with the patient. The patient was provided an opportunity to ask questions and all were answered. The patient agreed with the plan and demonstrated an understanding of the instructions.  A copy of instructions were sent to the patient via MyChart unless otherwise noted below.    The patient was advised to call back or seek an in-person evaluation if the symptoms worsen or if the condition fails to improve as anticipated.   [name], PA-C

## 2023-05-03 ENCOUNTER — Telehealth: Payer: BC Managed Care – PPO | Admitting: Physician Assistant

## 2023-05-03 DIAGNOSIS — Z77120 Contact with and (suspected) exposure to mold (toxic): Secondary | ICD-10-CM | POA: Diagnosis not present

## 2023-05-03 DIAGNOSIS — R051 Acute cough: Secondary | ICD-10-CM

## 2023-05-03 MED ORDER — CETIRIZINE HCL 10 MG PO TABS
10.0000 mg | ORAL_TABLET | Freq: Every day | ORAL | 0 refills | Status: AC
Start: 2023-05-03 — End: ?

## 2023-05-03 MED ORDER — FLUTICASONE PROPIONATE 50 MCG/ACT NA SUSP
2.0000 | Freq: Every day | NASAL | 0 refills | Status: AC
Start: 2023-05-03 — End: ?

## 2023-05-03 NOTE — Progress Notes (Signed)
E-Visit for Cough  We are sorry that you are not feeling well.  Here is how we plan to help!  Based on your presentation I believe you most likely have  A cough due to mold exposure.  Mold exposure is best treated with antihistamines and intranasal steroid nasal sprays. I will prescribe an allergy medication, Zyrtec 10 mg daily and Fluticasone nasal spray 2 sprays in each nostril daily.    In addition you may use A non-prescription cough medication called Mucinex DM: take 2 tablets every 12 hours. and A prescription cough medication called Tessalon Perles 100mg . You may take 1-2 capsules every 8 hours as needed for your cough.   From your responses in the eVisit questionnaire you describe inflammation in the upper respiratory tract which is causing a significant cough.  This is commonly called Bronchitis and has four common causes:   Allergies Viral Infections Acid Reflux Bacterial Infection Allergies, viruses and acid reflux are treated by controlling symptoms or eliminating the cause. An example might be a cough caused by taking certain blood pressure medications. You stop the cough by changing the medication. Another example might be a cough caused by acid reflux. Controlling the reflux helps control the cough.  If symptoms fail to improve please be seen in person for a chest xray for prolonged cough. They may also require sputum culture testing as well.    HOME CARE Only take medications as instructed by your medical team. Complete the entire course of an antibiotic. Drink plenty of fluids and get plenty of rest. Avoid close contacts especially the very young and the elderly Cover your mouth if you cough or cough into your sleeve. Always remember to wash your hands A steam or ultrasonic humidifier can help congestion.   GET HELP RIGHT AWAY IF: You develop worsening fever. You become short of breath You cough up blood. Your symptoms persist after you have completed your treatment  plan MAKE SURE YOU  Understand these instructions. Will watch your condition. Will get help right away if you are not doing well or get worse.    Thank you for choosing an e-visit.  Your e-visit answers were reviewed by a board certified advanced clinical practitioner to complete your personal care plan. Depending upon the condition, your plan could have included both over the counter or prescription medications.  Please review your pharmacy choice. Make sure the pharmacy is open so you can pick up prescription now. If there is a problem, you may contact your provider through [employer] and have the prescription routed to another pharmacy.  Your safety is important to [place]. If you have drug allergies check your prescription carefully.   For the next 24 hours you can use MyChart to ask questions about today's visit, request a non-urgent call back, or ask for a work or school excuse. You will get an email in the next two days asking about your experience. I hope that your e-visit has been valuable and will speed your recovery.   I have spent 5 minutes in review of e-visit questionnaire, review and updating patient chart, medical decision making and response to patient.   [name], PA-C

## 2023-06-06 ENCOUNTER — Ambulatory Visit (INDEPENDENT_AMBULATORY_CARE_PROVIDER_SITE_OTHER): Payer: BC Managed Care – PPO

## 2023-06-06 DIAGNOSIS — Z3042 Encounter for surveillance of injectable contraceptive: Secondary | ICD-10-CM

## 2023-06-06 MED ORDER — MEDROXYPROGESTERONE ACETATE 150 MG/ML IM SUSP
150.0000 mg | Freq: Once | INTRAMUSCULAR | Status: AC
Start: 2023-06-06 — End: 2023-06-06
  Administered 2023-06-06: 150 mg via INTRAMUSCULAR

## 2023-06-06 NOTE — Progress Notes (Signed)
Patient here today for Depo Provera injection and is within her dates.    Last contraceptive appt was 09/24/2022  Depo given in RUOQ today.  Site unremarkable & patient tolerated injection.    Next injection due 08/22/23-09/05/23.  Reminder card given.    Veronda Prude, RN

## 2023-07-04 ENCOUNTER — Telehealth: Payer: Self-pay

## 2023-07-04 ENCOUNTER — Ambulatory Visit
Admission: EM | Admit: 2023-07-04 | Discharge: 2023-07-04 | Disposition: A | Discharge status: Home / Self Care | Payer: BC Managed Care – PPO | Attending: Family Medicine | Admitting: Family Medicine

## 2023-07-04 ENCOUNTER — Telehealth: Payer: BC Managed Care – PPO | Admitting: Physician Assistant

## 2023-07-04 ENCOUNTER — Ambulatory Visit (INDEPENDENT_AMBULATORY_CARE_PROVIDER_SITE_OTHER): Payer: BC Managed Care – PPO

## 2023-07-04 ENCOUNTER — Encounter: Payer: Self-pay | Admitting: Physician Assistant

## 2023-07-04 ENCOUNTER — Telehealth: Payer: Self-pay | Admitting: Urgent Care

## 2023-07-04 DIAGNOSIS — B9689 Other specified bacterial agents as the cause of diseases classified elsewhere: Secondary | ICD-10-CM

## 2023-07-04 DIAGNOSIS — J209 Acute bronchitis, unspecified: Secondary | ICD-10-CM

## 2023-07-04 DIAGNOSIS — J208 Acute bronchitis due to other specified organisms: Secondary | ICD-10-CM

## 2023-07-04 MED ORDER — AZITHROMYCIN 250 MG PO TABS
ORAL_TABLET | ORAL | 0 refills | Status: AC
Start: 2023-07-04 — End: 2023-07-09

## 2023-07-04 MED ORDER — BENZONATATE 100 MG PO CAPS: 100.0000 mg | ORAL_CAPSULE | Freq: Three times a day (TID) | ORAL | 0 refills | Status: DC | PRN

## 2023-07-04 MED ORDER — PSEUDOEPH-BROMPHEN-DM 30-2-10 MG/5ML PO SYRP
5.0000 mL | ORAL_SOLUTION | Freq: Four times a day (QID) | ORAL | 0 refills | Status: DC | PRN
Start: 2023-07-04 — End: 2023-07-04

## 2023-07-04 MED ORDER — PREDNISONE 20 MG PO TABS: ORAL_TABLET | ORAL | 0 refills | Status: DC

## 2023-07-04 MED ORDER — PROMETHAZINE-DM 6.25-15 MG/5ML PO SYRP: 5.0000 mL | ORAL_SOLUTION | Freq: Three times a day (TID) | ORAL | 0 refills | Status: DC | PRN

## 2023-07-04 MED ORDER — ALBUTEROL SULFATE HFA 108 (90 BASE) MCG/ACT IN AERS
1.0000 | INHALATION_SPRAY | Freq: Four times a day (QID) | RESPIRATORY_TRACT | 0 refills | Status: AC | PRN
Start: 2023-07-04 — End: ?

## 2023-07-04 NOTE — Telephone Encounter (Signed)
Scripts sent for prednisone and promethazine cough syrup.

## 2023-07-04 NOTE — Progress Notes (Signed)
Virtual Visit Consent   [name], you are scheduled for a virtual visit with a [facility] provider today. Just as with appointments in the office, your consent must be obtained to participate. Your consent will be active for this visit and any virtual visit you may have with one of our providers in the next 365 days. If you have a MyChart account, a copy of this consent can be sent to you electronically.  As this is a virtual visit, video technology does not allow for your provider to perform a traditional examination. This may limit your provider's ability to fully assess your condition. If your provider identifies any concerns that need to be evaluated in person or the need to arrange testing (such as labs, EKG, etc.), we will make arrangements to do so. Although advances in technology are sophisticated, we cannot ensure that it will always work on either your end or our end. If the connection with a video visit is poor, the visit may have to be switched to a telephone visit. With either a video or telephone visit, we are not always able to ensure that we have a secure connection.  By engaging in this virtual visit, you consent to the provision of healthcare and authorize for your insurance to be billed (if applicable) for the services provided during this visit. Depending on your insurance coverage, you may receive a charge related to this service.  I need to obtain your verbal consent now. Are you willing to proceed with your visit today? MONIKA BORROWMAN has provided verbal consent on 07/04/2023 for a virtual visit (video or telephone). [name], PA-C  Date: 07/04/2023 1:07 PM  Virtual Visit via Video Note   I, [name], connected with  KEYSHAWN LARRISON  ([id number], 06-16-1991) on 07/04/23 at  1:00 PM EST by a video-enabled telemedicine application and verified that I am speaking with the correct person using two identifiers.  Location: Patient: Virtual Visit Location  Patient: Home Provider: Virtual Visit Location Provider: Home Office   I discussed the limitations of evaluation and management by telemedicine and the availability of in person appointments. The patient expressed understanding and agreed to proceed.    History of Present Illness: JAMAREA TIGNER is a 32 y.o. who identifies as a female who was assigned female at birth, and is being seen today for URI symptoms.  HPI: URI  This is a new problem. The current episode started in the past 7 days. The problem has been gradually worsening. There has been no fever. Associated symptoms include chest pain (tightness), congestion, coughing, headaches, rhinorrhea, sinus pain and a sore throat (initially lasted for one day). Pertinent negatives include no diarrhea, ear pain, nausea, plugged ear sensation or wheezing. Treatments tried: theraflu daytime and nighttime. The treatment provided no relief.     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/29/2020   History of abnormal cervical Pap smear 08/28/2020   High grade squamous intraepithelial lesion (HGSIL), grade 3 CIN, 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    Allergies: No Known Allergies Medications:  Current Outpatient Medications:    albuterol (VENTOLIN HFA) 108 (90 Base) MCG/ACT inhaler, Inhale 1-2 puffs into the lungs every 6 (six) hours as needed., Disp: 8 g, Rfl:  0   azithromycin (ZITHROMAX) 250 MG tablet, Take 2 tablets on day 1, then 1 tablet daily on days 2 through 5, Disp: 6 tablet, Rfl: 0   benzonatate (TESSALON) 100 MG capsule, Take 1-2 capsules (100-200 mg total) by mouth 3 (three) times daily as needed., Disp: 30 capsule, Rfl: 0   brompheniramine-pseudoephedrine-DM 30-2-10 MG/5ML syrup, Take 5 mLs by  mouth 4 (four) times daily as needed., Disp: 120 mL, Rfl: 0   scopolamine (TRANSDERM-SCOP) 1 MG/3DAYS, Place 1 patch (1.5 mg total) onto the skin every 3 (three) days., Disp: 4 patch, Rfl: 0   baclofen (LIORESAL) 10 MG tablet, Take 0.5 tablets (5 mg total) by mouth 3 (three) times daily., Disp: 15 each, Rfl: 0   cetirizine (ZYRTEC) 10 MG tablet, Take 1 tablet (10 mg total) by mouth daily., Disp: 30 tablet, Rfl: 0   docusate sodium (COLACE) 100 MG capsule, Take 1 capsule (100 mg total) by mouth every 12 (twelve) hours., Disp: 60 capsule, Rfl: 0   fluticasone (FLONASE) 50 MCG/ACT nasal spray, Place 2 sprays into both nostrils daily., Disp: 16 g, Rfl: 0   hydrocortisone 1 % ointment, Apply 1 Application topically 2 (two) times daily., Disp: 30 g, Rfl: 0   ketoconazole (NIZORAL) 2 % shampoo, Apply 1 Application topically 2 (two) times a week., Disp: 120 mL, Rfl: 2   loperamide (IMODIUM) 2 MG capsule, Take 2 capsules (4 mg total) by mouth as needed for diarrhea or loose stools., Disp: 30 capsule, Rfl: 0   naproxen (NAPROSYN) 375 MG tablet, Take 1 tablet (375 mg total) by mouth 2 (two) times daily with a meal., Disp: 30 tablet, Rfl: 0   ondansetron (ZOFRAN-ODT) 4 MG disintegrating tablet, Take 1 tablet (4 mg total) by mouth every 8 (eight) hours as needed for nausea or vomiting., Disp: 20 tablet, Rfl: 0   terbinafine (LAMISIL) 1 % cream, Apply 1 Application topically 2 (two) times daily., Disp: 30 g, Rfl: 2   zolmitriptan (ZOMIG-ZMT) 5 MG disintegrating tablet, Take 1 tablet (5 mg total) by mouth once as needed for up to 1 dose for migraine., Disp: 10 tablet, Rfl: 0  Observations/Objective: Patient is well-developed, well-nourished in no acute distress.  Resting comfortably at home.  Head is normocephalic, atraumatic.  No labored breathing.  Speech is clear and coherent with logical content.  Patient is alert and oriented at baseline.    Assessment and Plan: 1. Acute bacterial bronchitis -  azithromycin (ZITHROMAX) 250 MG tablet; Take 2 tablets on day 1, then 1 tablet daily on days 2 through 5  Dispense: 6 tablet; Refill: 0 - brompheniramine-pseudoephedrine-DM 30-2-10 MG/5ML syrup; Take 5 mLs by mouth 4 (four) times daily as needed.  Dispense: 120 mL; Refill: 0 - albuterol (VENTOLIN HFA) 108 (90 Base) MCG/ACT inhaler; Inhale 1-2 puffs into the lungs every 6 (six) hours as needed.  Dispense: 8 g; Refill: 0 - benzonatate (TESSALON) 100 MG capsule; Take 1-2 capsules (100-200 mg total) by mouth 3 (three) times daily as needed.  Dispense: 30 capsule; Refill: 0  - Worsening symptoms - Will treat with Z-pack, Albuterol, Bromfed-DM, and tessalon perles - Can continue Mucinex  - Push fluids.  - Rest.  - Steam and humidifier can help - Seek in person evaluation if worsening or symptoms fail to improve    Follow Up Instructions: I discussed the assessment and treatment plan with the patient. The patient was provided an opportunity to ask questions and all were answered. The patient agreed with the plan  and demonstrated an understanding of the instructions.  A copy of instructions were sent to the patient via MyChart unless otherwise noted below.    The patient was advised to call back or seek an in-person evaluation if the symptoms worsen or if the condition fails to improve as anticipated.    [name], PA-C

## 2023-07-04 NOTE — Patient Instructions (Signed)
Sabrina Brennan, thank you for joining [name], PA-C for today's virtual visit.  While this provider is not your primary care provider (PCP), if your PCP is located in our provider database this encounter information will be shared with them immediately following your visit.   A [facility] MyChart account gives you access to today's visit and all your visits, tests, and labs performed at [facility] " click here if you don't have a [facility] MyChart account or go to [email or web address]  Consent: (Patient) Sabrina Brennan provided verbal consent for this virtual visit at the beginning of the encounter.  Current Medications:  Current Outpatient Medications:    albuterol (VENTOLIN HFA) 108 (90 Base) MCG/ACT inhaler, Inhale 1-2 puffs into the lungs every 6 (six) hours as needed., Disp: 8 g, Rfl: 0   azithromycin (ZITHROMAX) 250 MG tablet, Take 2 tablets on day 1, then 1 tablet daily on days 2 through 5, Disp: 6 tablet, Rfl: 0   benzonatate (TESSALON) 100 MG capsule, Take 1-2 capsules (100-200 mg total) by mouth 3 (three) times daily as needed., Disp: 30 capsule, Rfl: 0   brompheniramine-pseudoephedrine-DM 30-2-10 MG/5ML syrup, Take 5 mLs by mouth 4 (four) times daily as needed., Disp: 120 mL, Rfl: 0   scopolamine (TRANSDERM-SCOP) 1 MG/3DAYS, Place 1 patch (1.5 mg total) onto the skin every 3 (three) days., Disp: 4 patch, Rfl: 0   baclofen (LIORESAL) 10 MG tablet, Take 0.5 tablets (5 mg total) by mouth 3 (three) times daily., Disp: 15 each, Rfl: 0   cetirizine (ZYRTEC) 10 MG tablet, Take 1 tablet (10 mg total) by mouth daily., Disp: 30 tablet, Rfl: 0   docusate sodium (COLACE) 100 MG capsule, Take 1 capsule (100 mg total) by mouth every 12 (twelve) hours., Disp: 60 capsule, Rfl: 0   fluticasone (FLONASE) 50 MCG/ACT nasal spray, Place 2 sprays into both nostrils daily., Disp: 16 g, Rfl: 0   hydrocortisone 1 % ointment, Apply 1 Application topically 2 (two) times  daily., Disp: 30 g, Rfl: 0   ketoconazole (NIZORAL) 2 % shampoo, Apply 1 Application topically 2 (two) times a week., Disp: 120 mL, Rfl: 2   loperamide (IMODIUM) 2 MG capsule, Take 2 capsules (4 mg total) by mouth as needed for diarrhea or loose stools., Disp: 30 capsule, Rfl: 0   naproxen (NAPROSYN) 375 MG tablet, Take 1 tablet (375 mg total) by mouth 2 (two) times daily with a meal., Disp: 30 tablet, Rfl: 0   ondansetron (ZOFRAN-ODT) 4 MG disintegrating tablet, Take 1 tablet (4 mg total) by mouth every 8 (eight) hours as needed for nausea or vomiting., Disp: 20 tablet, Rfl: 0   terbinafine (LAMISIL) 1 % cream, Apply 1 Application topically 2 (two) times daily., Disp: 30 g, Rfl: 2   zolmitriptan (ZOMIG-ZMT) 5 MG disintegrating tablet, Take 1 tablet (5 mg total) by mouth once as needed for up to 1 dose for migraine., Disp: 10 tablet, Rfl: 0   Medications ordered in this encounter:  Meds ordered this encounter  Medications   azithromycin (ZITHROMAX) 250 MG tablet    Sig: Take 2 tablets on day 1, then 1 tablet daily on days 2 through 5    Dispense:  6 tablet    Refill:  0    Order Specific Question:   Supervising Provider    Answer:   Merrilee Jansky [[id number]]   brompheniramine-pseudoephedrine-DM 30-2-10 MG/5ML syrup    Sig: Take 5 mLs by mouth 4 (four) times daily as  needed.    Dispense:  120 mL    Refill:  0    Order Specific Question:   Supervising Provider    Answer:   Merrilee Jansky [[id number]]   albuterol (VENTOLIN HFA) 108 (90 Base) MCG/ACT inhaler    Sig: Inhale 1-2 puffs into the lungs every 6 (six) hours as needed.    Dispense:  8 g    Refill:  0    Order Specific Question:   Supervising Provider    Answer:   Merrilee Jansky [[id number]]   benzonatate (TESSALON) 100 MG capsule    Sig: Take 1-2 capsules (100-200 mg total) by mouth 3 (three) times daily as needed.    Dispense:  30 capsule    Refill:  0    Order Specific Question:   Supervising Provider    Answer:   Merrilee Jansky [id number]     *If you need refills on other medications prior to your next appointment, please contact your pharmacy*  Follow-Up: Call back or seek an in-person evaluation if the symptoms worsen or if the condition fails to improve as anticipated.  [facility] [phone]  Other Instructions Acute Bronchitis, Adult  Acute bronchitis is sudden inflammation of the main airways (bronchi) that come off the windpipe (trachea) in the lungs. The swelling causes the airways to get smaller and make more mucus than normal. This can make it hard to breathe and can cause coughing or noisy breathing (wheezing). Acute bronchitis may last several weeks. The cough may last longer. Allergies, asthma, and exposure to smoke may make the condition worse. What are the causes? This condition can be caused by germs and by substances that irritate the lungs, including: Cold and flu viruses. The most common cause of this condition is the virus that causes the common cold. Bacteria. This is less common. Breathing in substances that irritate the lungs, including: Smoke from cigarettes and other forms of tobacco. Dust and pollen. Fumes from household cleaning products, gases, or burned fuel. Indoor or outdoor air pollution. What increases the risk? The following factors may make you more likely to develop this condition: A weak body's defense system, also called the immune system. A condition that affects your lungs and breathing, such as asthma. What are the signs or symptoms? Common symptoms of this condition include: Coughing. This may bring up clear, yellow, or green mucus from your lungs (sputum). Wheezing. Runny or stuffy nose. Having too much mucus in your lungs (chest congestion). Shortness of breath. Aches and pains, including sore throat or chest. How is this diagnosed? This condition is usually diagnosed based on: Your symptoms and medical history. A physical exam. You  may also have other tests, including tests to rule out other conditions, such as pneumonia. These tests include: A test of lung function. Test of a mucus sample to look for the presence of bacteria. Tests to check the oxygen level in your blood. Blood tests. Chest X-ray. How is this treated? Most cases of acute bronchitis clear up over time without treatment. Your health care provider may recommend: Drinking more fluids to help thin your mucus so it is easier to cough up. Taking inhaled medicine (inhaler) to improve air flow in and out of your lungs. Using a vaporizer or a humidifier. These are machines that add water to the air to help you breathe better. Taking a medicine that thins mucus and clears congestion (expectorant). Taking a medicine that prevents or stops coughing (cough  suppressant). It is not common to take an antibiotic medicine for this condition. Follow these instructions at home:  Take over-the-counter and prescription medicines only as told by your health care provider. Use an inhaler, vaporizer, or humidifier as told by your health care provider. Take two teaspoons (10 mL) of honey at bedtime to lessen coughing at night. Drink enough fluid to keep your urine pale yellow. Do not use any products that contain nicotine or tobacco. These products include cigarettes, chewing tobacco, and vaping devices, such as e-cigarettes. If you need help quitting, ask your health care provider. Get plenty of rest. Return to your normal activities as told by your health care provider. Ask your health care provider what activities are safe for you. Keep all follow-up visits. This is important. How is this prevented? To lower your risk of getting this condition again: Wash your hands often with soap and water for at least 20 seconds. If soap and water are not available, use hand sanitizer. Avoid contact with people who have cold symptoms. Try not to touch your mouth, nose, or eyes with  your hands. Avoid breathing in smoke or chemical fumes. Breathing smoke or chemical fumes will make your condition worse. Get the flu shot every year. Contact a health care provider if: Your symptoms do not improve after 2 weeks. You have trouble coughing up the mucus. Your cough keeps you awake at night. You have a fever. Get help right away if you: Cough up blood. Feel pain in your chest. Have severe shortness of breath. Faint or keep feeling like you are going to faint. Have a severe headache. Have a fever or chills that get worse. These symptoms may represent a serious problem that is an emergency. Do not wait to see if the symptoms will go away. Get medical help right away. Call your local emergency services (911 in the [place] Do not drive yourself to the hospital. Summary Acute bronchitis is inflammation of the main airways (bronchi) that come off the windpipe (trachea) in the lungs. The swelling causes the airways to get smaller and make more mucus than normal. Drinking more fluids can help thin your mucus so it is easier to cough up. Take over-the-counter and prescription medicines only as told by your health care provider. Do not use any products that contain nicotine or tobacco. These products include cigarettes, chewing tobacco, and vaping devices, such as e-cigarettes. If you need help quitting, ask your health care provider. Contact a health care provider if your symptoms do not improve after 2 weeks. This information is not intended to replace advice given to you by your health care provider. Make sure you discuss any questions you have with your health care provider. Document Revised: 10/29/2021 Document Reviewed: 11/19/2020 Elsevier Patient Education  2024 [employer]    If you have been instructed to have an in-person evaluation today at a local Urgent Care facility, please use the link below. It will take you to a list of all of our available [facility],  including address, phone number and hours of operation. Please do not delay care.  [facility]  If you or a family member do not have a primary care provider, use the link below to schedule a visit and establish care. When you choose a [facility] primary care physician or advanced practice provider, you gain a long-term partner in health. Find a Primary Care Provider  Learn more about [facility]'s in-office and virtual care options:  [place] [facility] [place] - Get Care Now

## 2023-07-04 NOTE — Telephone Encounter (Signed)
Pt was notified today rx x 2 sent to [facility] on [address] by PA-C and that he will call her with pending CXR results

## 2023-07-04 NOTE — Discharge Instructions (Signed)
I will prescribe a different cough syrup that you can take together with prednisone. This is a steroid (prednisone) and taking it with a Bromfed cough syrup is not recommended. I will instead prescribe promethazine-dextromethorphan to use as a cough syrup. I will call with your x-ray results later. Please take azithromycin, an antibiotic, that was prescribed by your [facility] provider. You can use benzonatate cough capsules, Zyrtec as well.

## 2023-07-04 NOTE — ED Triage Notes (Signed)
Pt reports she had a virtual visit today and was prescribed med, pt has not started yet.  Reports shortness of breast, chest pain when coughing, chest dry cough x 2 days.

## 2023-07-04 NOTE — ED Provider Notes (Signed)
[facility]  Note:  This document was prepared using Conservation officer, historic buildings and may include unintentional dictation errors.  MRN: [id number] DOB: [birth year]  Subjective:   Sabrina Brennan is a 32 y.o. female presenting for 2-day history of acute onset shortness of breath, chest pain with coughing, chest tightness, malaise, fatigue, body pains.  Patient had a virtual visit and was assumed to have bacterial bronchitis, prescribed supportive care medications as well as an antibiotic with azithromycin.  Was also advised to come in for an evaluation given her chest pain.  No smoking of any kind including cigarettes, cigars, vaping, marijuana use.  No history of asthma.  Patient has not yet picked up her medications from her virtual visit.  No current facility-administered medications for this encounter.  Current Outpatient Medications:    scopolamine (TRANSDERM-SCOP) 1 MG/3DAYS, Place 1 patch (1.5 mg total) onto the skin every 3 (three) days., Disp: 4 patch, Rfl: 0   albuterol (VENTOLIN HFA) 108 (90 Base) MCG/ACT inhaler, Inhale 1-2 puffs into the lungs every 6 (six) hours as needed., Disp: 8 g, Rfl: 0   azithromycin (ZITHROMAX) 250 MG tablet, Take 2 tablets on day 1, then 1 tablet daily on days 2 through 5, Disp: 6 tablet, Rfl: 0   baclofen (LIORESAL) 10 MG tablet, Take 0.5 tablets (5 mg total) by mouth 3 (three) times daily., Disp: 15 each, Rfl: 0   benzonatate (TESSALON) 100 MG capsule, Take 1-2 capsules (100-200 mg total) by mouth 3 (three) times daily as needed., Disp: 30 capsule, Rfl: 0   brompheniramine-pseudoephedrine-DM 30-2-10 MG/5ML syrup, Take 5 mLs by mouth 4 (four) times daily as needed., Disp: 120 mL, Rfl: 0   cetirizine (ZYRTEC) 10 MG tablet, Take 1 tablet (10 mg total) by mouth daily., Disp: 30 tablet, Rfl: 0   docusate sodium (COLACE) 100 MG capsule, Take 1 capsule (100 mg total) by mouth every 12 (twelve) hours., Disp: 60 capsule, Rfl: 0    fluticasone (FLONASE) 50 MCG/ACT nasal spray, Place 2 sprays into both nostrils daily., Disp: 16 g, Rfl: 0   hydrocortisone 1 % ointment, Apply 1 Application topically 2 (two) times daily., Disp: 30 g, Rfl: 0   ketoconazole (NIZORAL) 2 % shampoo, Apply 1 Application topically 2 (two) times a week., Disp: 120 mL, Rfl: 2   loperamide (IMODIUM) 2 MG capsule, Take 2 capsules (4 mg total) by mouth as needed for diarrhea or loose stools., Disp: 30 capsule, Rfl: 0   naproxen (NAPROSYN) 375 MG tablet, Take 1 tablet (375 mg total) by mouth 2 (two) times daily with a meal., Disp: 30 tablet, Rfl: 0   ondansetron (ZOFRAN-ODT) 4 MG disintegrating tablet, Take 1 tablet (4 mg total) by mouth every 8 (eight) hours as needed for nausea or vomiting., Disp: 20 tablet, Rfl: 0   terbinafine (LAMISIL) 1 % cream, Apply 1 Application topically 2 (two) times daily., Disp: 30 g, Rfl: 2   zolmitriptan (ZOMIG-ZMT) 5 MG disintegrating tablet, Take 1 tablet (5 mg total) by mouth once as needed for up to 1 dose for migraine., Disp: 10 tablet, Rfl: 0   No Known Allergies  Past Medical History:  Diagnosis Date   Medical history non-contributory    Migraine      Past Surgical History:  Procedure Laterality Date   EXCISION MASS LOWER EXTREMETIES Left 12/11/2019   Procedure: EXCISION LEFT THIGH CYST;  Surgeon: Harriette Bouillon, MD;  Location: [facility];  Service: General;  Laterality: Left;  LIPOSUCTION MULTIPLE BODY PARTS      Family History  Problem Relation Age of Onset   Healthy Mother    Healthy Father    Breast cancer Maternal Grandmother    Hypertension Maternal Grandmother    Pancreatic cancer Maternal Grandfather    Migraines Maternal Grandfather    Breast cancer Other        Maternal aunt    Social History   Tobacco Use   Smoking status: Never    Passive exposure: Never   Smokeless tobacco: Never   Tobacco comments:    Hooka 2x monthly  Vaping Use   Vaping status: Never Used   Substance Use Topics   Alcohol use: Yes    Comment: occ   Drug use: No    ROS   Objective:   Vitals: BP 133/86 (BP Location: Left Arm)   Pulse 78   Temp 98.5 F (36.9 C) (Oral)   Resp 18   SpO2 96%   Physical Exam Constitutional:      General: She is not in acute distress.    Appearance: Normal appearance. She is well-developed and normal weight. She is not ill-appearing, toxic-appearing or diaphoretic.  HENT:     Head: Normocephalic and atraumatic.     Right Ear: Tympanic membrane, ear canal and external ear normal. No drainage or tenderness. No middle ear effusion. There is no impacted cerumen. Tympanic membrane is not erythematous or bulging.     Left Ear: Tympanic membrane, ear canal and external ear normal. No drainage or tenderness.  No middle ear effusion. There is no impacted cerumen. Tympanic membrane is not erythematous or bulging.     Nose: Nose normal. No congestion or rhinorrhea.     Mouth/Throat:     Mouth: Mucous membranes are moist. No oral lesions.     Pharynx: No pharyngeal swelling, oropharyngeal exudate, posterior oropharyngeal erythema or uvula swelling.     Tonsils: No tonsillar exudate or tonsillar abscesses.  Eyes:     General: No scleral icterus.       Right eye: No discharge.        Left eye: No discharge.     Extraocular Movements: Extraocular movements intact.     Right eye: Normal extraocular motion.     Left eye: Normal extraocular motion.     Conjunctiva/sclera: Conjunctivae normal.  Cardiovascular:     Rate and Rhythm: Normal rate and regular rhythm.     Heart sounds: Normal heart sounds. No murmur heard.    No friction rub. No gallop.  Pulmonary:     Effort: Pulmonary effort is normal. No respiratory distress.     Breath sounds: No stridor. Examination of the right-middle field reveals rhonchi. Examination of the left-middle field reveals rhonchi. Rhonchi present. No wheezing or rales.  Chest:     Chest wall: No tenderness.   Musculoskeletal:     Cervical back: Normal range of motion and neck supple.  Lymphadenopathy:     Cervical: No cervical adenopathy.  Skin:    General: Skin is warm and dry.  Neurological:     General: No focal deficit present.     Mental Status: She is alert and oriented to person, place, and time.  Psychiatric:        Mood and Affect: Mood normal.        Behavior: Behavior normal.    DG Chest 2 View  Result Date: 07/04/2023 CLINICAL DATA:  Chest pain and cough. EXAM: CHEST - 2 VIEW COMPARISON:  Chest radiograph dated 08/17/2018. FINDINGS: The heart size and mediastinal contours are within normal limits. Both lungs are clear. The visualized skeletal structures are unremarkable. IMPRESSION: No active cardiopulmonary disease. Electronically Signed   By: Elgie Collard M.D.   On: 07/04/2023 18:00     Assessment and Plan :   PDMP not reviewed this encounter.  1. Acute bronchitis, unspecified organism    Recommended patient follow the treatment plan as discussed through the virtual visit.  I will additionally prescribe an oral prednisone course given her significant respiratory symptoms, chest pain.  This is all an effort to manage her acute bronchitis.  Counseled patient on potential for adverse effects with medications prescribed/recommended today, ER and return-to-clinic precautions discussed, patient verbalized understanding.    Wallis Bamberg, [place] 07/05/23 [phone]

## 2023-07-07 ENCOUNTER — Telehealth: Payer: Self-pay

## 2023-07-07 MED ORDER — PREDNISONE 20 MG PO TABS: ORAL_TABLET | ORAL | 0 refills | Status: DC

## 2023-07-07 MED ORDER — PROMETHAZINE-DM 6.25-15 MG/5ML PO SYRP: 5.0000 mL | ORAL_SOLUTION | Freq: Three times a day (TID) | ORAL | 0 refills | Status: DC | PRN

## 2023-07-08 ENCOUNTER — Encounter: Payer: Self-pay | Admitting: Student

## 2023-07-13 ENCOUNTER — Telehealth: Payer: BC Managed Care – PPO

## 2023-07-13 ENCOUNTER — Telehealth: Payer: BC Managed Care – PPO | Admitting: Physician Assistant

## 2023-07-13 DIAGNOSIS — R0789 Other chest pain: Secondary | ICD-10-CM

## 2023-07-13 NOTE — Progress Notes (Signed)
Virtual Visit Consent   [name], you are scheduled for a virtual visit with a [facility] provider today. Just as with appointments in the office, your consent must be obtained to participate. Your consent will be active for this visit and any virtual visit you may have with one of our providers in the next 365 days. If you have a MyChart account, a copy of this consent can be sent to you electronically.  As this is a virtual visit, video technology does not allow for your provider to perform a traditional examination. This may limit your provider's ability to fully assess your condition. If your provider identifies any concerns that need to be evaluated in person or the need to arrange testing (such as labs, EKG, etc.), we will make arrangements to do so. Although advances in technology are sophisticated, we cannot ensure that it will always work on either your end or our end. If the connection with a video visit is poor, the visit may have to be switched to a telephone visit. With either a video or telephone visit, we are not always able to ensure that we have a secure connection.  By engaging in this virtual visit, you consent to the provision of healthcare and authorize for your insurance to be billed (if applicable) for the services provided during this visit. Depending on your insurance coverage, you may receive a charge related to this service.  I need to obtain your verbal consent now. Are you willing to proceed with your visit today? Sabrina Brennan has provided verbal consent on 07/13/2023 for a virtual visit (video or telephone). [name], [place]  Date: 07/13/2023 10:09 AM  Virtual Visit via Video Note   I, [name], [name]  ([id number], [birth year]) on 07/13/23 at 10:00 AM EST by a video-enabled telemedicine application and verified that I am speaking with the correct person using two identifiers.  Location: Patient: Virtual Visit  Location Patient: Home Provider: Virtual Visit Location Provider: Home Office   I discussed the limitations of evaluation and management by telemedicine and the availability of in person appointments. The patient expressed understanding and agreed to proceed.    History of Present Illness: Sabrina Brennan is a 32 y.o. who identifies as a female who was assigned female at birth, and is being seen today for some continued pain in her sternal area over the past week after treatment for acute bacterial bronchitis. Notes completing treatment with improvement in her respiratory symptoms, just having a residual dry cough. Due to noting chest pain was evaluated at outside [facility] on 12/6 at which time she was examined and CXR obtained (negative). Was started on prednisone for suspected inflammation. Notes taking as directed with no improvement. Notes pain is persistent and moves to her back. Denies vomiting. Denies fever, chills. Some increased stressors recently so unsure if could be related. Does note feeling fatigued and some SOB with exertion.      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   History of abnormal cervical Pap smear 08/28/2020   High grade squamous intraepithelial lesion (HGSIL), grade 3 CIN, 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    Allergies: No Known Allergies Medications:  Current Outpatient  Medications:    scopolamine (TRANSDERM-SCOP) 1 MG/3DAYS, Place 1 patch (1.5 mg total) onto the skin every 3 (three) days., Disp: 4 patch, Rfl: 0   albuterol (VENTOLIN HFA) 108 (90 Base) MCG/ACT inhaler, Inhale 1-2 puffs into the lungs every 6 (six) hours as needed., Disp: 8 g, Rfl: 0   baclofen (LIORESAL) 10 MG tablet, Take 0.5 tablets (5 mg  total) by mouth 3 (three) times daily., Disp: 15 each, Rfl: 0   benzonatate (TESSALON) 100 MG capsule, Take 1-2 capsules (100-200 mg total) by mouth 3 (three) times daily as needed., Disp: 30 capsule, Rfl: 0   cetirizine (ZYRTEC) 10 MG tablet, Take 1 tablet (10 mg total) by mouth daily., Disp: 30 tablet, Rfl: 0   docusate sodium (COLACE) 100 MG capsule, Take 1 capsule (100 mg total) by mouth every 12 (twelve) hours., Disp: 60 capsule, Rfl: 0   fluticasone (FLONASE) 50 MCG/ACT nasal spray, Place 2 sprays into both nostrils daily., Disp: 16 g, Rfl: 0   hydrocortisone 1 % ointment, Apply 1 Application topically 2 (two) times daily., Disp: 30 g, Rfl: 0   ketoconazole (NIZORAL) 2 % shampoo, Apply 1 Application topically 2 (two) times a week., Disp: 120 mL, Rfl: 2   loperamide (IMODIUM) 2 MG capsule, Take 2 capsules (4 mg total) by mouth as needed for diarrhea or loose stools., Disp: 30 capsule, Rfl: 0   naproxen (NAPROSYN) 375 MG tablet, Take 1 tablet (375 mg total) by mouth 2 (two) times daily with a meal., Disp: 30 tablet, Rfl: 0   ondansetron (ZOFRAN-ODT) 4 MG disintegrating tablet, Take 1 tablet (4 mg total) by mouth every 8 (eight) hours as needed for nausea or vomiting., Disp: 20 tablet, Rfl: 0   predniSONE (DELTASONE) 20 MG tablet, Take 2 tablets daily with breakfast., Disp: 10 tablet, Rfl: 0   promethazine-dextromethorphan (PROMETHAZINE-DM) 6.25-15 MG/5ML syrup, Take 5 mLs by mouth 3 (three) times daily as needed for cough., Disp: 200 mL, Rfl: 0   terbinafine (LAMISIL) 1 % cream, Apply 1 Application topically 2 (two) times daily., Disp: 30 g, Rfl: 2   zolmitriptan (ZOMIG-ZMT) 5 MG disintegrating tablet, Take 1 tablet (5 mg total) by mouth once as needed for up to 1 dose for migraine., Disp: 10 tablet, Rfl: 0  Observations/Objective: Patient is well-developed, well-nourished in no acute distress.  Resting comfortably  at home.  Head is normocephalic, atraumatic.  No labored breathing.  Speech  is clear and coherent with logical content.  Patient is alert and oriented at baseline.   Assessment and Plan: 1. Atypical chest pain  Discussed need for further evaluation giving ongoing and persistent symptoms. Giving duration, not concerned for ACS. PCP unavailable. Send her for in-person evaluation this morning within [facility], recommending ER for any worsening symptoms.   Follow Up Instructions: I discussed the assessment and treatment plan with the patient. The patient was provided an opportunity to ask questions and all were answered. The patient agreed with the plan and demonstrated an understanding of the instructions.  A copy of instructions were sent to the patient via MyChart unless otherwise noted below.   The patient was advised to call back or seek an in-person evaluation if the symptoms worsen or if the condition fails to improve as anticipated.    [name], PA-C

## 2023-07-13 NOTE — Patient Instructions (Signed)
[name], thank you for joining [name], PA-C for today's virtual visit.  While this provider is not your primary care provider (PCP), if your PCP is located in our provider database this encounter information will be shared with them immediately following your visit.   A [facility] MyChart account gives you access to today's visit and all your visits, tests, and labs performed at [facility] " click here if you don't have a [facility] MyChart account or go to [email or web address]  Consent: (Patient) [name] provided verbal consent for this virtual visit at the beginning of the encounter.  Current Medications:  Current Outpatient Medications:    scopolamine (TRANSDERM-SCOP) 1 MG/3DAYS, Place 1 patch (1.5 mg total) onto the skin every 3 (three) days., Disp: 4 patch, Rfl: 0   albuterol (VENTOLIN HFA) 108 (90 Base) MCG/ACT inhaler, Inhale 1-2 puffs into the lungs every 6 (six) hours as needed., Disp: 8 g, Rfl: 0   baclofen (LIORESAL) 10 MG tablet, Take 0.5 tablets (5 mg total) by mouth 3 (three) times daily., Disp: 15 each, Rfl: 0   benzonatate (TESSALON) 100 MG capsule, Take 1-2 capsules (100-200 mg total) by mouth 3 (three) times daily as needed., Disp: 30 capsule, Rfl: 0   cetirizine (ZYRTEC) 10 MG tablet, Take 1 tablet (10 mg total) by mouth daily., Disp: 30 tablet, Rfl: 0   docusate sodium (COLACE) 100 MG capsule, Take 1 capsule (100 mg total) by mouth every 12 (twelve) hours., Disp: 60 capsule, Rfl: 0   fluticasone (FLONASE) 50 MCG/ACT nasal spray, Place 2 sprays into both nostrils daily., Disp: 16 g, Rfl: 0   hydrocortisone 1 % ointment, Apply 1 Application topically 2 (two) times daily., Disp: 30 g, Rfl: 0   ketoconazole (NIZORAL) 2 % shampoo, Apply 1 Application topically 2 (two) times a week., Disp: 120 mL, Rfl: 2   loperamide (IMODIUM) 2 MG capsule, Take 2 capsules (4 mg total) by mouth as needed for diarrhea or loose stools., Disp: 30  capsule, Rfl: 0   naproxen (NAPROSYN) 375 MG tablet, Take 1 tablet (375 mg total) by mouth 2 (two) times daily with a meal., Disp: 30 tablet, Rfl: 0   ondansetron (ZOFRAN-ODT) 4 MG disintegrating tablet, Take 1 tablet (4 mg total) by mouth every 8 (eight) hours as needed for nausea or vomiting., Disp: 20 tablet, Rfl: 0   predniSONE (DELTASONE) 20 MG tablet, Take 2 tablets daily with breakfast., Disp: 10 tablet, Rfl: 0   promethazine-dextromethorphan (PROMETHAZINE-DM) 6.25-15 MG/5ML syrup, Take 5 mLs by mouth 3 (three) times daily as needed for cough., Disp: 200 mL, Rfl: 0   terbinafine (LAMISIL) 1 % cream, Apply 1 Application topically 2 (two) times daily., Disp: 30 g, Rfl: 2   zolmitriptan (ZOMIG-ZMT) 5 MG disintegrating tablet, Take 1 tablet (5 mg total) by mouth once as needed for up to 1 dose for migraine., Disp: 10 tablet, Rfl: 0   Medications ordered in this encounter:  No orders of the defined types were placed in this encounter.    *If you need refills on other medications prior to your next appointment, please contact your pharmacy*  Follow-Up: Call back or seek an in-person evaluation if the symptoms worsen or if the condition fails to improve as anticipated.   Virtual Care [phone]  Other Instructions   If you have been instructed to have an in-person evaluation today at a local Urgent Care facility, please use the link below. It will take you to  a list of all of our available [facility], including address, phone number and hours of operation. Please do not delay care.  [facility]  If you or a family member do not have a primary care provider, use the link below to schedule a visit and establish care. When you choose a [facility] primary care physician or advanced practice provider, you gain a long-term partner in health. Find a Primary Care Provider  Learn more about [facility] in-office and virtual care options: [facility] -  Get Care Now

## 2023-07-14 ENCOUNTER — Ambulatory Visit
Admission: RE | Admit: 2023-07-14 | Discharge: 2023-07-14 | Disposition: A | Discharge status: Home / Self Care | Payer: BC Managed Care – PPO | Source: Ambulatory Visit | Attending: Internal Medicine | Admitting: Internal Medicine

## 2023-07-14 VITALS — BP 115/80 | HR 85 | Temp 98.7°F | Resp 16

## 2023-07-14 DIAGNOSIS — R0789 Other chest pain: Secondary | ICD-10-CM | POA: Diagnosis not present

## 2023-07-14 DIAGNOSIS — J209 Acute bronchitis, unspecified: Secondary | ICD-10-CM

## 2023-07-14 MED ORDER — IBUPROFEN 600 MG PO TABS: 600.0000 mg | ORAL_TABLET | Freq: Four times a day (QID) | ORAL | 0 refills | Status: DC | PRN

## 2023-07-14 MED ORDER — CYCLOBENZAPRINE HCL 5 MG PO TABS: 5.0000 mg | ORAL_TABLET | Freq: Every evening | ORAL | 0 refills | Status: AC | PRN

## 2023-07-14 NOTE — ED Triage Notes (Signed)
Pt c/o central CP x 1.5 weeks-states she was seen here for same/completed-feels no better-NAD-steady gait

## 2023-07-14 NOTE — ED Provider Notes (Signed)
[facility]  Note:  This document was prepared using Conservation officer, historic buildings and may include unintentional dictation errors.  MRN: [id number] DOB: [birth year]  Subjective:   Sabrina Brennan is a 32 y.o. female presenting for 1.5 week history of persistent mid-sternal chest pain.  Was seen 07/04/2023.  Was manage for acute bronchitis with azithromycin, prednisone course.  The pharmacy delayed her prednisone course.  She just finished it a day ago.  Continues to have pain.  All respiratory symptoms are improved however.  No history of respiratory disorders.  No history of cardiac conditions.  No smoking.  No drug use.  No diabetes.  No current facility-administered medications for this encounter.  Current Outpatient Medications:    scopolamine (TRANSDERM-SCOP) 1 MG/3DAYS, Place 1 patch (1.5 mg total) onto the skin every 3 (three) days., Disp: 4 patch, Rfl: 0   albuterol (VENTOLIN HFA) 108 (90 Base) MCG/ACT inhaler, Inhale 1-2 puffs into the lungs every 6 (six) hours as needed., Disp: 8 g, Rfl: 0   baclofen (LIORESAL) 10 MG tablet, Take 0.5 tablets (5 mg total) by mouth 3 (three) times daily., Disp: 15 each, Rfl: 0   benzonatate (TESSALON) 100 MG capsule, Take 1-2 capsules (100-200 mg total) by mouth 3 (three) times daily as needed., Disp: 30 capsule, Rfl: 0   cetirizine (ZYRTEC) 10 MG tablet, Take 1 tablet (10 mg total) by mouth daily., Disp: 30 tablet, Rfl: 0   docusate sodium (COLACE) 100 MG capsule, Take 1 capsule (100 mg total) by mouth every 12 (twelve) hours., Disp: 60 capsule, Rfl: 0   fluticasone (FLONASE) 50 MCG/ACT nasal spray, Place 2 sprays into both nostrils daily., Disp: 16 g, Rfl: 0   hydrocortisone 1 % ointment, Apply 1 Application topically 2 (two) times daily., Disp: 30 g, Rfl: 0   ketoconazole (NIZORAL) 2 % shampoo, Apply 1 Application topically 2 (two) times a week., Disp: 120 mL, Rfl: 2   loperamide (IMODIUM) 2 MG capsule, Take 2 capsules (4  mg total) by mouth as needed for diarrhea or loose stools., Disp: 30 capsule, Rfl: 0   naproxen (NAPROSYN) 375 MG tablet, Take 1 tablet (375 mg total) by mouth 2 (two) times daily with a meal., Disp: 30 tablet, Rfl: 0   ondansetron (ZOFRAN-ODT) 4 MG disintegrating tablet, Take 1 tablet (4 mg total) by mouth every 8 (eight) hours as needed for nausea or vomiting., Disp: 20 tablet, Rfl: 0   predniSONE (DELTASONE) 20 MG tablet, Take 2 tablets daily with breakfast., Disp: 10 tablet, Rfl: 0   promethazine-dextromethorphan (PROMETHAZINE-DM) 6.25-15 MG/5ML syrup, Take 5 mLs by mouth 3 (three) times daily as needed for cough., Disp: 200 mL, Rfl: 0   terbinafine (LAMISIL) 1 % cream, Apply 1 Application topically 2 (two) times daily., Disp: 30 g, Rfl: 2   zolmitriptan (ZOMIG-ZMT) 5 MG disintegrating tablet, Take 1 tablet (5 mg total) by mouth once as needed for up to 1 dose for migraine., Disp: 10 tablet, Rfl: 0   No Known Allergies  Past Medical History:  Diagnosis Date   Medical history non-contributory    Migraine      Past Surgical History:  Procedure Laterality Date   EXCISION MASS LOWER EXTREMETIES Left 12/11/2019   Procedure: EXCISION LEFT THIGH CYST;  Surgeon: Harriette Bouillon, MD;  Location: [facility];  Service: General;  Laterality: Left;   LIPOSUCTION MULTIPLE BODY PARTS      Family History  Problem Relation Age of Onset  Healthy Mother    Healthy Father    Breast cancer Maternal Grandmother    Hypertension Maternal Grandmother    Pancreatic cancer Maternal Grandfather    Migraines Maternal Grandfather    Breast cancer Other        Maternal aunt    Social History   Tobacco Use   Smoking status: Never    Passive exposure: Never   Smokeless tobacco: Never   Tobacco comments:    Hooka 2x monthly  Vaping Use   Vaping status: Never Used  Substance Use Topics   Alcohol use: Yes    Comment: occ   Drug use: No    ROS   Objective:   Vitals: There were  no vitals taken for this visit.  Physical Exam Constitutional:      General: She is not in acute distress.    Appearance: Normal appearance. She is well-developed. She is not ill-appearing, toxic-appearing or diaphoretic.  HENT:     Head: Normocephalic and atraumatic.     Nose: Nose normal.     Mouth/Throat:     Mouth: Mucous membranes are moist.  Eyes:     General: No scleral icterus.       Right eye: No discharge.        Left eye: No discharge.     Extraocular Movements: Extraocular movements intact.  Cardiovascular:     Rate and Rhythm: Normal rate and regular rhythm.     Heart sounds: Normal heart sounds. No murmur heard.    No friction rub. No gallop.  Pulmonary:     Effort: Pulmonary effort is normal. No respiratory distress.     Breath sounds: No stridor. No wheezing, rhonchi or rales.  Chest:     Chest wall: Tenderness (mid-lower sternum) present.  Skin:    General: Skin is warm and dry.  Neurological:     General: No focal deficit present.     Mental Status: She is alert and oriented to person, place, and time.  Psychiatric:        Mood and Affect: Mood normal.        Behavior: Behavior normal.    ED ECG REPORT   Date: 07/14/2023  EKG Time: 11:38 AM  Rate: 71bpm  Rhythm: normal sinus rhythm,  unchanged from previous tracings  Axis: normal  Intervals:none  ST&T Change: Nonspecific T wave flattening in lead III  Narrative Interpretation: Sinus rhythm at 71 bpm with nonspecific T wave change.  Very comparable to previous EKG.   Assessment and Plan :   PDMP not reviewed this encounter.  1. Acute bronchitis, unspecified organism   2. Atypical chest pain    Suspect atypical chest pain is related to ongoing bronchitis.  Patient has a low PERC score, low heart score.  Will defer ER visit for further workup.  Vital signs hemodynamically stable for outpatient management with conservative management.  Counseled patient on potential for adverse effects with  medications prescribed/recommended today, ER and return-to-clinic precautions discussed, patient verbalized understanding.    Wallis Bamberg, [place] 07/14/23 [id number]

## 2023-07-29 ENCOUNTER — Telehealth: Payer: BC Managed Care – PPO | Admitting: Physician Assistant

## 2023-07-29 DIAGNOSIS — T304 Corrosion of unspecified body region, unspecified degree: Secondary | ICD-10-CM

## 2023-07-29 MED ORDER — SILVER SULFADIAZINE 1 % EX CREA
1.0000 | TOPICAL_CREAM | Freq: Two times a day (BID) | CUTANEOUS | 0 refills | Status: DC
Start: 2023-07-29 — End: 2024-04-29
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
## 2023-07-29 NOTE — Patient Instructions (Signed)
Sabrina Brennan, thank you for joining [name], PA-C for today's virtual visit.  While this provider is not your primary care provider (PCP), if your PCP is located in our provider database this encounter information will be shared with them immediately following your visit.   A [facility] MyChart account gives you access to today's visit and all your visits, tests, and labs performed at [facility] " click here if you don't have a [facility] MyChart account or go to [email or web address]  Consent: (Patient) Sabrina Brennan provided verbal consent for this virtual visit at the beginning of the encounter.  Current Medications:  Current Outpatient Medications:    scopolamine (TRANSDERM-SCOP) 1 MG/3DAYS, Place 1 patch (1.5 mg total) onto the skin every 3 (three) days., Disp: 4 patch, Rfl: 0   silver sulfADIAZINE (SILVADENE) 1 % cream, Apply 1 Application topically 2 (two) times daily., Disp: 50 g, Rfl: 0   albuterol (VENTOLIN HFA) 108 (90 Base) MCG/ACT inhaler, Inhale 1-2 puffs into the lungs every 6 (six) hours as needed., Disp: 8 g, Rfl: 0   baclofen (LIORESAL) 10 MG tablet, Take 0.5 tablets (5 mg total) by mouth 3 (three) times daily., Disp: 15 each, Rfl: 0   benzonatate (TESSALON) 100 MG capsule, Take 1-2 capsules (100-200 mg total) by mouth 3 (three) times daily as needed., Disp: 30 capsule, Rfl: 0   cetirizine (ZYRTEC) 10 MG tablet, Take 1 tablet (10 mg total) by mouth daily., Disp: 30 tablet, Rfl: 0   cyclobenzaprine (FLEXERIL) 5 MG tablet, Take 1 tablet (5 mg total) by mouth at bedtime as needed., Disp: 30 tablet, Rfl: 0   docusate sodium (COLACE) 100 MG capsule, Take 1 capsule (100 mg total) by mouth every 12 (twelve) hours., Disp: 60 capsule, Rfl: 0   fluticasone (FLONASE) 50 MCG/ACT nasal spray, Place 2 sprays into both nostrils daily., Disp: 16 g, Rfl: 0   hydrocortisone 1 % ointment, Apply 1 Application topically 2 (two) times daily., Disp: 30 g, Rfl: 0    ibuprofen (ADVIL) 600 MG tablet, Take 1 tablet (600 mg total) by mouth every 6 (six) hours as needed., Disp: 30 tablet, Rfl: 0   ketoconazole (NIZORAL) 2 % shampoo, Apply 1 Application topically 2 (two) times a week., Disp: 120 mL, Rfl: 2   loperamide (IMODIUM) 2 MG capsule, Take 2 capsules (4 mg total) by mouth as needed for diarrhea or loose stools., Disp: 30 capsule, Rfl: 0   naproxen (NAPROSYN) 375 MG tablet, Take 1 tablet (375 mg total) by mouth 2 (two) times daily with a meal., Disp: 30 tablet, Rfl: 0   ondansetron (ZOFRAN-ODT) 4 MG disintegrating tablet, Take 1 tablet (4 mg total) by mouth every 8 (eight) hours as needed for nausea or vomiting., Disp: 20 tablet, Rfl: 0   predniSONE (DELTASONE) 20 MG tablet, Take 2 tablets daily with breakfast., Disp: 10 tablet, Rfl: 0   promethazine-dextromethorphan (PROMETHAZINE-DM) 6.25-15 MG/5ML syrup, Take 5 mLs by mouth 3 (three) times daily as needed for cough., Disp: 200 mL, Rfl: 0   terbinafine (LAMISIL) 1 % cream, Apply 1 Application topically 2 (two) times daily., Disp: 30 g, Rfl: 2   zolmitriptan (ZOMIG-ZMT) 5 MG disintegrating tablet, Take 1 tablet (5 mg total) by mouth once as needed for up to 1 dose for migraine., Disp: 10 tablet, Rfl: 0   Medications ordered in this encounter:  Meds ordered this encounter  Medications   silver sulfADIAZINE (SILVADENE) 1 % cream    Sig: Apply 1  Application topically 2 (two) times daily.    Dispense:  50 g    Refill:  0    Supervising Provider:   Merrilee Jansky [[id number]]     *If you need refills on other medications prior to your next appointment, please contact your pharmacy*  Follow-Up: Call back or seek an in-person evaluation if the symptoms worsen or if the condition fails to improve as anticipated.  [facility] [phone]  Other Instructions Burn Care, Adult A burn is an injury to the skin or the tissues under the skin. It may be caused by a fire, hot liquid or steam,  chemicals, electricity, or the sun. There are three types of burns: First degree. These burns are similar to a sunburn. They may cause your skin to be red, slightly swollen, and tender. They may be treated at home. Second degree. These burns are very painful. They may cause your skin to turn very red, swell, leak fluid, look shiny, and blister. In many cases, these burns may be treated at home. If they cover your hands, feet, face, or genitals, get help from a health care provider. Third degree. These burns are the most severe. They may not be painful, but you may feel pain around the edges of them. Your skin may turn white or black and may look charred, dry, and leathery. These burns cause lasting damage. If you get a third-degree burn, get help right away. Treatment will depend on the type of burn you have. Taking care of your burn can help to prevent pain and infection. It can also help the burn heal more quickly. How to care for a first-degree burn Right after the burn: Rinse or soak the burn under cool water for 5 minutes or more. Put a cool, wet cloth (cool compress) on your skin. This may help with pain. Do not put ice on your burn. This can cause more damage. Caring for the burn Clean and care for the burn as told by your provider. You may be told to: Use mild soap and water to clean the area. Use a clean cloth to pat the burned area dry after cleaning it. Do not rub or scrub the burn. Put lotion or aloe vera gel on your skin. How to care for a second-degree burn Right after the burn: Rinse or soak the burn under cool water. Do this for 5-10 minutes. Do not put ice on your burn. This can cause more damage. Take off any jewelry or clothing near the burn. Lightly cover the burn with a clean cloth. Caring for the burn Clean and care for the burn as told by your provider. You may be told to: Clean or rinse out the burned area. Put a cream or ointment on the burn. You may need to use an  antibiotic cream that has silver in it. This can kill bacteria. Place a germ-free (sterile) dressing over the burn. A dressing is a bandage that is put over a burn to help it heal. Raise (elevate) the injured area above the level of your heart while you are sitting or lying down. How to care for a third-degree burn Right after the burn: Lightly cover the burn with a clean, dry cloth. Get help right away. You may need to: Stay in the hospital. Have surgery to remove burned tissue or get a skin graft. Get fluids through an IV. Caring for the burn Clean and care for the burn as told by your provider.  You may be told to: Clean or rinse out the burn. Put a cream or ointment on the burn. Put a sterile dressing in the burned area (packing). Put a sterile dressing over the burn. Use pressure (compression) dressings. Elevate the injured area above the level of your heart while you are sitting or lying down. Wear splints or immobilizers as told by your provider. Do exercises as told by your provider. Rest as told by your provider. Do not do sports or other physical activities until your provider says that you can. How to prevent infection when caring for a burn  Take these steps to prevent infection and more damage to the tissue. Make sure you: Wash your hands with soap and water for at least 20 seconds before and after you care for your burn. If soap and water are not available, use hand sanitizer. Wear clean gloves as told by your provider. Do not put butter, oil, toothpaste, or other home remedies on the burn. Do not scratch or pick at the burn. Do not break any blisters. Do not peel the skin. Do not rub your burn, even when cleaning it. Check your burn every day for signs of infection. Check for: More redness, swelling, or pain. Warmth. Pus or a bad smell. Red streaks around the burn. Follow these instructions at home Medicines Take over-the-counter and prescription medicines only as  told by your provider. If you were prescribed antibiotics, take or apply them as told by your provider. Do not stop using the antibiotic even if you start to feel better. General instructions Do not use any products that contain nicotine or tobacco. These products include cigarettes, chewing tobacco, and vaping devices, such as e-cigarettes. If you need help quitting, ask your provider. Drink enough fluid to keep your pee (urine) pale yellow. Protect your burn from the sun. Contact a health care provider if: Your burn does not get better, or it gets worse. You have any signs of infection. Your burn starts to look different or gets black or red spots. Your pain does not get better with medicine. You have anxiety or depression after the injury. Get help right away if: You have red streaks near the burn. You are in severe pain. This information is not intended to replace advice given to you by your health care provider. Make sure you discuss any questions you have with your health care provider. Document Revised: 08/05/2022 Document Reviewed: 08/04/2022 Elsevier Patient Education  2024 Elsevier Inc.    If you have been instructed to have an in-person evaluation today at a local Urgent Care facility, please use the link below. It will take you to a list of all of our available [facility], including address, phone number and hours of operation. Please do not delay care.  [facility]  If you or a family member do not have a primary care provider, use the link below to schedule a visit and establish care. When you choose a [facility] primary care physician or advanced practice provider, you gain a long-term partner in health. Find a Primary Care Provider  Learn more about [facility]'s in-office and virtual care options: [place] - Get Care Now

## 2023-08-01 ENCOUNTER — Ambulatory Visit: Payer: BC Managed Care – PPO | Admitting: Student

## 2023-08-22 ENCOUNTER — Encounter: Payer: Self-pay | Admitting: Student

## 2023-08-22 ENCOUNTER — Ambulatory Visit (INDEPENDENT_AMBULATORY_CARE_PROVIDER_SITE_OTHER): Payer: 59 | Admitting: Student

## 2023-08-22 VITALS — BP 121/96 | HR 93 | Ht 59.0 in | Wt 183.0 lb

## 2023-08-22 DIAGNOSIS — Z789 Other specified health status: Secondary | ICD-10-CM

## 2023-08-22 DIAGNOSIS — R41840 Attention and concentration deficit: Secondary | ICD-10-CM

## 2023-08-22 DIAGNOSIS — E669 Obesity, unspecified: Secondary | ICD-10-CM | POA: Insufficient documentation

## 2023-08-22 DIAGNOSIS — Z1339 Encounter for screening examination for other mental health and behavioral disorders: Secondary | ICD-10-CM | POA: Diagnosis not present

## 2023-08-22 MED ORDER — MEDROXYPROGESTERONE ACETATE 150 MG/ML IM SUSP
150.0000 mg | Freq: Once | INTRAMUSCULAR | Status: AC
Start: 2023-08-22 — End: 2023-08-22
  Administered 2023-08-22: 150 mg via INTRAMUSCULAR

## 2023-08-22 MED ORDER — KETOCONAZOLE 2 % EX SHAM: 1.0000 | MEDICATED_SHAMPOO | CUTANEOUS | 0 refills | Status: AC

## 2023-08-22 NOTE — Progress Notes (Unsigned)
    SUBJECTIVE:   CHIEF COMPLAINT / HPI:   Sabrina Brennan is a 33 year-old female here for Depo injection and discuss ADHD/anxiety.  Depo injection Last injection was 06/06/2023, she is within her dates to receive injection today Does not get menses on depo Sexually active, uses condoms always  Weight management She has had about a 20 pound weight gain in the past year, BMI 36.96. 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 She had to sit at a question for 25 minutes, had a really hard time focusing.   OBJECTIVE:   BP (!) 121/96   Pulse 93   Ht 4\' 11"  (1.499 m)   Wt 183 lb (83 kg)   SpO2 100%   BMI 36.96 kg/m   General: NAD, well appearing Cardiac: RRR Neuro: A&O Respiratory: normal WOB on RA. No wheezing or crackles on auscultation, good lung sounds throughout Extremities: Moving all 4 extremities equally  ASSESSMENT/PLAN:   Uses birth control Depo given in office today 08/22/2023 without complication Given card for next injection date  Obesity BMI 36.96, weight today 183 pounds, up from 167 pounds in March 2024. Says that she is not really sure where to start in terms of weight loss, she was previously seen by Dr. In [facility] who performed aesthetic surgery. She is reasonably concerned about her family history of comorbidities and wanting to recheck a healthier weight to reduce her risk of diabetes, hypertension, hyperlipidemia We discussed referral to healthy weight and wellness for comprehensive guidance and healthy patterns to maintain weight loss, which was placed  today.  Difficulty concentrating Referral to psychiatry sent today for difficulty concentrating, concern for ADHD     Darral Dash, DO  [facility]    {    This will disappear when note is signed, click to select method of visit    :1}

## 2023-08-22 NOTE — Patient Instructions (Addendum)
It was great seeing you today.  As we discussed, - I placed a referral to psychiatry for ADHD/anxiety evaluation - We gave you your Depot injection  Eczema Action Plan  Daily skin care routine  Bath: Use a gentle cleanser for dirty areas only at the need of the bath After the bath: Pat the skin dry, leaving it damp to the touch Moisturizer: Apply moisturizer to your entire body 2 times a day and immediately after bath. Use generously! This is the most important part!  Recommended moisturizers: Eucerin (rich moisture), Vaseline or Aquaphor. APPLY LIBERALLY.  If you have any questions or concerns, please feel free to call the clinic.   Have a wonderful day,  Dr. Darral Dash [facility] [phone]

## 2023-08-22 NOTE — Assessment & Plan Note (Signed)
Referral to psychiatry sent today for difficulty concentrating, concern for ADHD

## 2023-08-22 NOTE — Assessment & Plan Note (Addendum)
BMI 36.96, weight today 183 pounds, up from 167 pounds in March 2024. Says that she is not really sure where to start in terms of weight loss, she was previously seen by Dr. In [facility] who performed aesthetic surgery. She is reasonably concerned about her family history of comorbidities and wanting to recheck a healthier weight to reduce her risk of diabetes, hypertension, hyperlipidemia We discussed referral to healthy weight and wellness for comprehensive guidance and healthy patterns to maintain weight loss, which was placed today.

## 2023-08-22 NOTE — Assessment & Plan Note (Signed)
Depo given in office today 08/22/2023 without complication Given card for next injection date

## 2023-09-08 ENCOUNTER — Encounter: Payer: Self-pay | Admitting: Student

## 2023-09-09 ENCOUNTER — Encounter: Payer: Self-pay | Admitting: Student

## 2023-09-09 ENCOUNTER — Ambulatory Visit (INDEPENDENT_AMBULATORY_CARE_PROVIDER_SITE_OTHER): Payer: 59 | Admitting: Student

## 2023-09-09 VITALS — BP 109/80 | HR 78 | Ht 59.0 in | Wt 183.5 lb

## 2023-09-09 DIAGNOSIS — R519 Headache, unspecified: Secondary | ICD-10-CM | POA: Diagnosis not present

## 2023-09-09 NOTE — Progress Notes (Signed)
    SUBJECTIVE:   CHIEF COMPLAINT / HPI:   Migraine headaches Patient has chronic migraine disorder.  Was followed by neurology in 2022, had head imaging that was benign.  She has failed many prophylactic therapies including: Butobarbital, Ajovy , propranolol , topiramate , Nurtec.  Sumatriptan  and Excedrin are not successful abortion medications.    Reports that for the past 3 days she has had a headache that has been unrelenting, she has taken Excedrin daily.  Headache is bilateral, but worse on the left side.  Additionally, on further questioning patient has been taking Excedrin at least 4-5 days/week since December for headaches.  She does report stress is a big contributing to her headaches.  She denies any neurologic symptoms.  No nausea, vomiting.  Photophobia present.  Sleeping and dark room helps.  On chart review appears she was referred to CBT/psychotherapy to assist with stress reduction.  OBJECTIVE:   BP 109/80   Pulse 78   Ht 4' 11 (1.499 m)   Wt 183 lb 8 oz (83.2 kg)   SpO2 100%   BMI 37.06 kg/m    General: NAD, pleasant Cardio: RRR, no MRG. Cap Refill <2s. Respiratory: normal wob on RA Skin: Warm and dry Neuro: CN II: PERRL CN III, IV,VI: EOMI CV V: Normal sensation in V1, V2, V3 CVII: Symmetric smile and brow raise CN VIII: Normal hearing CN IX,X: Symmetric palate raise  CN XI: 5/5 shoulder shrug CN XII: Symmetric tongue protrusion  UE and LE strength 5/5 2+ LE reflexes  Normal sensation in UE and LE bilaterally  No ataxia with finger to nose, normal heel to shin  A&Ox4   ASSESSMENT/PLAN:   Assessment & Plan Acute intractable headache, unspecified headache type Acute intractable headache.  Normal neurologic exam.  No red flag symptoms, stable vitals. Differential includes migraine, tension headache, medication overuse headache.  Given excessive use of Excedrin, counseled to discontinue medication.  Patient did not want sedating medications today, so did  not give Benadryl/Compazine .  Given her use of Excedrin with aspirin, deferred Toradol  shot today.  Headaches historically unresponsive to many modalities-but does appear to respond well with rest and dark rooms. Patient request work note. - Will write out of work until Monday - Counseled to avoid Excedrin or other medications at this time, opt for rest - Consider 25 mg of Benadryl if headache unrelenting - Recommend follow-up with Dr. Damron in the next several weeks to assess for efficacy of discontinuation of medications - Number provided for her neurologist to call and schedule appointment, recommend follow-up  Gladis Church, DO [facility]

## 2023-09-09 NOTE — Patient Instructions (Addendum)
 It was great to see you! Thank you for allowing me to participate in your care!   I recommend that you always bring your medications to each appointment as this makes it easy to ensure we are on the correct medications and helps us  not miss when refills are needed.  Our plans for today:  - I think you may be getting rebound headaches from medication over-use. I recommend not taking any Excedrin for the next few days. - Please take time off from work and rest in a dark room. You can try 25 mg of benadryl if absolutely necessary. - Please call your neurologist for an appointment. [phone]  Take care and seek immediate care sooner if you develop any concerns. Please remember to show up 15 minutes before your scheduled appointment time!  Gladis Church, DO [facility]

## 2023-10-14 ENCOUNTER — Encounter: Payer: Self-pay | Admitting: Student

## 2023-10-14 DIAGNOSIS — R41840 Attention and concentration deficit: Secondary | ICD-10-CM

## 2023-11-10 ENCOUNTER — Encounter: Payer: Self-pay | Admitting: Student

## 2023-11-14 ENCOUNTER — Encounter (HOSPITAL_COMMUNITY): Payer: Self-pay | Admitting: Family

## 2023-11-14 ENCOUNTER — Ambulatory Visit (HOSPITAL_BASED_OUTPATIENT_CLINIC_OR_DEPARTMENT_OTHER): Payer: Self-pay | Admitting: Family

## 2023-11-14 ENCOUNTER — Ambulatory Visit

## 2023-11-14 ENCOUNTER — Other Ambulatory Visit: Payer: Self-pay

## 2023-11-14 ENCOUNTER — Other Ambulatory Visit (HOSPITAL_COMMUNITY): Payer: Self-pay

## 2023-11-14 VITALS — BP 125/88 | HR 78 | Ht 59.0 in | Wt 183.0 lb

## 2023-11-14 DIAGNOSIS — Z789 Other specified health status: Secondary | ICD-10-CM | POA: Diagnosis not present

## 2023-11-14 DIAGNOSIS — F4323 Adjustment disorder with mixed anxiety and depressed mood: Secondary | ICD-10-CM | POA: Diagnosis not present

## 2023-11-14 MED ORDER — ESCITALOPRAM OXALATE 5 MG PO TABS: ORAL_TABLET | ORAL | 0 refills | Status: DC

## 2023-11-14 MED ORDER — MEDROXYPROGESTERONE ACETATE 150 MG/ML IM SUSP
150.0000 mg | Freq: Once | INTRAMUSCULAR | Status: AC
  Administered 2023-11-14: 150 mg via INTRAMUSCULAR

## 2023-11-14 MED ORDER — ESCITALOPRAM OXALATE 10 MG PO TABS: 10.0000 mg | ORAL_TABLET | Freq: Every day | ORAL | 0 refills | Status: DC

## 2023-11-14 MED ORDER — HYDROXYZINE HCL 10 MG PO TABS: 10.0000 mg | ORAL_TABLET | Freq: Three times a day (TID) | ORAL | 0 refills | Status: DC | PRN

## 2023-11-14 NOTE — Progress Notes (Signed)
 Patient here today for Depo Provera injection and is within her dates.    Last contraceptive appt was 08/22/2023.  Depo given in RUOQ today.  Site unremarkable & patient tolerated injection.    Next injection due 01/30/2024-02/13/2024.  Reminder card given.

## 2023-11-14 NOTE — Progress Notes (Signed)
 Notice received from pharmacy that the Lexapro needed a prior auth, it did not - I resent the Lexapro as 10 mg, take 1/2 tab daily for 5 days then increase to 1 tablet a day

## 2023-11-14 NOTE — Progress Notes (Addendum)
 Psychiatric Initial Adult Assessment   Patient Identification: ROSEBUD KOENEN MRN:  [id number] Date of Evaluation:  11/14/2023 Referral Source: Melissa Noon, DO  Chief Complaint:  " I feel overstimulated at times."  Visit Diagnosis:    ICD-10-CM   1. Adjustment disorder with mixed anxiety and depressed mood  F43.23       History of Present Illness: Ny'asia Sprick 33 year old African-American female presents to establish care.  Reports she was referred by her primary care provider Dr. Melissa Noon.  Reports dealing with multiple psychosocial stressors currently.  Reports her most pressing concern is related to her recent diagnosis that her grandmother received.  States her grandmother was diagnosed with cancer.  Reports her grandmother resides in [place] and she has feelings related to not being able to feed their for her grandmother.  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  States often times she gets overwhelmed and overstimulated due to behaviors related to her students.  States that administrative has not been supportive.  For often times she "zone out" when conflict arise in her classroom.  She reports ongoing rumination which is worse at night.  She reports restless night and has been able to fall asleep.  Reports poor concentration and ability to focus.-Consideration for adult ADHD testing.   Ny'asia denies previous inpatient admissions.  Denies previous suicide attempts.  Denies history related to substance abuse.  Denies that she is ever been prescribed any psychotropic medications.  Stated her symptoms started roughly 1 year ago.  Reports she is currently in a relationship states her partner is very supportive.  She reports a family history related to substance abuse.  Paternal grandfather: Alcohol/drug abuse.  States her younger brother  and her sister both attended rehabilitation program for substance abuse.  Her mother struggled with alcohol abuse.   She reports her symptoms include depression, anxiety, mood swings, sleep disturbance, poor concentration and weight gain.  PHQ-9 13 GAD-7 15  Adjustment disorder: Mixed mood anxiety depression Initiated Lexapro 5 mg x 3 days increase to Lexapro 10 mg thereafter Initiated hydroxyzine 10 to 20 mg p.o. as needed - Follow-up for consideration with propranolol-Test anxiety Patient reports she has to complete this test about April 30th - Schedule outpatient follow-up with therapy services Cyril Loosen. 5/5  Ladonna A Corso is sitting; tearful throughout this assessment.  She presents with a bright,  pleasant and affect. she is alert/oriented x 4; calm/cooperative; and mood congruent with affect.  Patient is speaking in a clear tone at moderate volume, and normal pace; with good eye contact.   Her thought process is coherent and relevant; There is no indication that she is currently responding to internal/external stimuli or experiencing delusional thought content.  Patient denies suicidal/self-harm/homicidal ideation, psychosis, and paranoia.  Patient has remained calm throughout assessment and has answered questions appropriately.   Associated Signs/Symptoms: Depression Symptoms:  difficulty concentrating, anxiety, (Hypo) Manic Symptoms:  Distractibility, Anxiety Symptoms:  Excessive Worry, Psychotic Symptoms:  Hallucinations: None PTSD Symptoms: NA  Past Psychiatric History:   Previous Psychotropic Medications: No   Substance Abuse History in the last 12 months:  No.  Consequences of Substance Abuse: NA  Past Medical History:  Past Medical History:  Diagnosis Date   Diarrhea 09/24/2022   Medical history non-contributory    Migraine     Past Surgical History:  Procedure Laterality Date  EXCISION MASS LOWER EXTREMETIES Left 12/11/2019   Procedure: EXCISION LEFT  THIGH CYST;  Surgeon: Harriette Bouillon, MD;  Location: [facility];  Service: General;  Laterality: Left;   LIPOSUCTION MULTIPLE BODY PARTS      Family Psychiatric History:   Family History:  Family History  Problem Relation Age of Onset   Healthy Mother    Healthy Father    Breast cancer Maternal Grandmother    Hypertension Maternal Grandmother    Pancreatic cancer Maternal Grandfather    Migraines Maternal Grandfather    Breast cancer Other        Maternal aunt    Social History:   Social History   Socioeconomic History   Marital status: Single    Spouse name: Not on file   Number of children: Not on file   Years of education: Not on file   Highest education level: Bachelor's degree (e.g., BA, AB, BS)  Occupational History   Not on file  Tobacco Use   Smoking status: Never    Passive exposure: Never   Smokeless tobacco: Never   Tobacco comments:    Hooka 2x monthly  Vaping Use   Vaping status: Never Used  Substance and Sexual Activity   Alcohol use: Yes    Comment: occ   Drug use: No   Sexual activity: Yes    Birth control/protection: Injection  Other Topics Concern   Not on file  Social History Narrative   Lives with her mom   Right handed   Caffeine: 2 liter pepsi/day      Patient reports she is under family stress. Will be seeking help from counselor.    Social Drivers of Corporate investment banker Strain: Low Risk  (07/28/2023)   Overall Financial Resource Strain (CARDIA)    Difficulty of Paying Living Expenses: Not very hard  Food Insecurity: No Food Insecurity (07/28/2023)   Hunger Vital Sign    Worried About Running Out of Food in the Last Year: Never true    Ran Out of Food in the Last Year: Never true  Transportation Needs: No Transportation Needs (07/28/2023)   PRAPARE - Administrator, Civil Service (Medical): No    Lack of Transportation (Non-Medical): No  Physical Activity: Unknown (07/28/2023)   Exercise Vital  Sign    Days of Exercise per Week: 0 days    Minutes of Exercise per Session: Not on file  Stress: Stress Concern Present (07/28/2023)   [employer] of Occupational Health - Occupational Stress Questionnaire    Feeling of Stress : Very much  Social Connections: Socially Isolated (07/28/2023)   Social Connection and Isolation Panel [NHANES]    Frequency of Communication with Friends and Family: More than three times a week    Frequency of Social Gatherings with Friends and Family: Twice a week    Attends Religious Services: Never    Database administrator or Organizations: No    Attends Engineer, structural: Not on file    Marital Status: Never married    Additional Social History:   Allergies:  No Known Allergies  Metabolic Disorder Labs: Lab Results  Component Value Date   HGBA1C 5.3 04/30/2022   No results found for: "PROLACTIN" No results found for: "CHOL", "TRIG", "HDL", "CHOLHDL", "VLDL", "LDLCALC" Lab Results  Component Value Date   TSH 0.764 05/14/2022    Therapeutic Level Labs: No results found for: "LITHIUM" No results found for: "CBMZ" No results found for: "  VALPROATE"  Current Medications: Current Outpatient Medications  Medication Sig Dispense Refill   baclofen (LIORESAL) 10 MG tablet Take 0.5 tablets (5 mg total) by mouth 3 (three) times daily. 15 each 0   benzonatate (TESSALON) 100 MG capsule Take 1-2 capsules (100-200 mg total) by mouth 3 (three) times daily as needed. 30 capsule 0   cetirizine (ZYRTEC) 10 MG tablet Take 1 tablet (10 mg total) by mouth daily. 30 tablet 0   escitalopram (LEXAPRO) 5 MG tablet Take 1 tablet (5 mg total) by mouth daily for 5 days, THEN 2 tablets (10 mg total) daily. 48 tablet 0   fluticasone (FLONASE) 50 MCG/ACT nasal spray Place 2 sprays into both nostrils daily. 16 g 0   hydrOXYzine (ATARAX) 10 MG tablet Take 1 tablet (10 mg total) by mouth 3 (three) times daily as needed. May take 2 tablets ( 20mg  total) at  night for sleep disturbance 30 tablet 0   ibuprofen (ADVIL) 600 MG tablet Take 1 tablet (600 mg total) by mouth every 6 (six) hours as needed. 30 tablet 0   ketoconazole (NIZORAL) 2 % shampoo Apply 1 Application topically 2 (two) times a week. 120 mL 2   ketoconazole (NIZORAL) 2 % shampoo Apply 1 Application topically 2 (two) times a week. 120 mL 0   promethazine-dextromethorphan (PROMETHAZINE-DM) 6.25-15 MG/5ML syrup Take 5 mLs by mouth 3 (three) times daily as needed for cough. 200 mL 0   scopolamine (TRANSDERM-SCOP) 1 MG/3DAYS Place 1 patch (1.5 mg total) onto the skin every 3 (three) days. 4 patch 0   albuterol (VENTOLIN HFA) 108 (90 Base) MCG/ACT inhaler Inhale 1-2 puffs into the lungs every 6 (six) hours as needed. (Patient not taking: Reported on 11/14/2023) 8 g 0   cyclobenzaprine (FLEXERIL) 5 MG tablet Take 1 tablet (5 mg total) by mouth at bedtime as needed. (Patient not taking: Reported on 11/14/2023) 30 tablet 0   docusate sodium (COLACE) 100 MG capsule Take 1 capsule (100 mg total) by mouth every 12 (twelve) hours. (Patient not taking: Reported on 11/14/2023) 60 capsule 0   hydrocortisone 1 % ointment Apply 1 Application topically 2 (two) times daily. (Patient not taking: Reported on 11/14/2023) 30 g 0   loperamide (IMODIUM) 2 MG capsule Take 2 capsules (4 mg total) by mouth as needed for diarrhea or loose stools. (Patient not taking: Reported on 11/14/2023) 30 capsule 0   naproxen (NAPROSYN) 375 MG tablet Take 1 tablet (375 mg total) by mouth 2 (two) times daily with a meal. (Patient not taking: Reported on 11/14/2023) 30 tablet 0   ondansetron (ZOFRAN-ODT) 4 MG disintegrating tablet Take 1 tablet (4 mg total) by mouth every 8 (eight) hours as needed for nausea or vomiting. (Patient not taking: Reported on 11/14/2023) 20 tablet 0   predniSONE (DELTASONE) 20 MG tablet Take 2 tablets daily with breakfast. (Patient not taking: Reported on 11/14/2023) 10 tablet 0   silver sulfADIAZINE (SILVADENE) 1 %  cream Apply 1 Application topically 2 (two) times daily. (Patient not taking: Reported on 11/14/2023) 50 g 0   terbinafine (LAMISIL) 1 % cream Apply 1 Application topically 2 (two) times daily. (Patient not taking: Reported on 11/14/2023) 30 g 2   zolmitriptan (ZOMIG-ZMT) 5 MG disintegrating tablet Take 1 tablet (5 mg total) by mouth once as needed for up to 1 dose for migraine. (Patient not taking: Reported on 11/14/2023) 10 tablet 0   No current facility-administered medications for this visit.    Musculoskeletal: Strength & Muscle Tone:  within normal limits Gait & Station: normal Patient leans: N/A  Psychiatric Specialty Exam: Review of Systems  Blood pressure 125/88, pulse 78, height 4\' 11"  (1.499 m), weight 183 lb (83 kg).Body mass index is 36.96 kg/m.  General Appearance: Casual  Eye Contact:  Good  Speech:  Clear and Coherent  Volume:  Normal  Mood:  Anxious and Depressed  Affect:  Congruent  Thought Process:  Coherent  Orientation:  Full (Time, Place, and Person)  Thought Content:  Logical  Suicidal Thoughts:  No  Homicidal Thoughts:  No  Memory:  Immediate;   Good Recent;   Good  Judgement:  Good  Insight:  Good  Psychomotor Activity:  Normal  Concentration:  Concentration: Fair  Recall:  Good  Fund of Knowledge:Good  Language: Good  Akathisia:  No  Handed:  Right  AIMS (if indicated):  not done  Assets:  Communication Skills Desire for Improvement  ADL's:  Intact  Cognition: WNL  Sleep:  Good   Screenings: AUDIT    Flowsheet Row Appointment from 08/01/2023 in [facility] - [facility] Dept Of [place]. [facility] Appointment from 10/25/2022 in [facility] - A Dept Of Eligha Bridegroom. [facility]  Alcohol Use Disorder Identification Test Final Score (AUDIT) 14  3      GAD-7    Flowsheet Row Procedure visit from 05/28/2019 in Center for [facility]  Total GAD-7 Score 0      PHQ2-9    Flowsheet Row  [facility] from [facility]/14/2023 in [facility] Office Visit from 09/09/2023 in [facility] - A Dept Of [place]. [facility] Office Visit from 08/22/2023 in [facility] - A Dept Of [place]. [facility] Office Visit from 10/12/2022 in [facility] - A Dept Of [facility]ideg[facility]. [facility] Office Visit from 09/24/2022 in [facility] - A Dept Of [facility]ideg[facility]. [facility]  PHQ-2 Total Score 2 0 0 0 0  PHQ-9 Total Score 13 0 5 0 0      Flowsheet Row ED from 07/14/2023 in [facility] at [facility] ED from 07/04/2023 in [facility]) ED from 03/19/2023 in [facility] at [facility])  C-SSRS RISK CATEGORY No Risk No Risk Error: Question 1 not populated       Assessment and Plan: Rylyn Zawistowski 33 year old African-American female presents to establish care.  Was referred by her primary care provider due to multiple stressors related to depression and anxiety.  She denies that she is ever been followed by psychiatry or therapy services.  She states she feels that she is at her breaking point and is amendable to starting medication for mood stabilization.  Discussed initiating Lexapro and hydroxyzine.  Will follow-up in 3 weeks.  Consideration for initiating propranolol for test anxiety.  She was amendable to plan.  Support encouragement reassurance was provided.   Adjustment disorder: Mixed mood anxiety depression: Initiated Lexapro 5 mg x 3 days increase to Lexapro 10 mg thereafter Initiated hydroxyzine 10 to 20 mg p.o. as needed - Follow-up for consideration with propranolol-Test anxiety Patient reports she has to complete this test about April 30th - Schedule outpatient follow-up with therapy services Cyril Loosen. 5/5  Collaboration of Care: Medication Management AEB initiated Lexapro  and hydroxyzine  Patient/Guardian was advised  Release of Information must be obtained prior to any record release in order to collaborate their care with an outside provider. Patient/Guardian was advised if they have not already done so to contact the registration department to sign all necessary forms in order for us  to release information regarding their care.   Consent: Patient/Guardian gives verbal consent for treatment and assignment of benefits for services provided during this visit. Patient/Guardian expressed understanding and agreed to proceed.   Levester Reagin, NP 4/14/20259:05 AM

## 2023-11-21 ENCOUNTER — Telehealth

## 2023-11-21 ENCOUNTER — Ambulatory Visit
Admission: EM | Admit: 2023-11-21 | Discharge: 2023-11-21 | Disposition: A | Discharge status: Home / Self Care | Attending: Family Medicine | Admitting: Family Medicine

## 2023-11-21 DIAGNOSIS — L568 Other specified acute skin changes due to ultraviolet radiation: Secondary | ICD-10-CM

## 2023-11-21 MED ORDER — PREDNISONE 20 MG PO TABS: ORAL_TABLET | ORAL | 0 refills | Status: DC

## 2023-11-21 NOTE — Discharge Instructions (Signed)
 Please start an oral prednisone  course for the photodermatitis. Take luke warm baths this week. Use cold aloe vera for soothing of your skin OR Aquaphor OR Lubriderm for extra dry skin.

## 2023-11-21 NOTE — ED Provider Notes (Signed)
 [facility]  Note:  This document was prepared using Conservation officer, historic buildings and may include unintentional dictation errors.  MRN: [id number] DOB: 1990/08/29  Subjective:   Sabrina Brennan is a 33 y.o. female presenting for 2-day history of persistent rash over the back, shoulders and flexor surfaces of her forearms.  Symptoms started from spending a lot of time outdoors while traveling in [place].  Reports that she had a lot of sun exposure.  She did wear sunblock consistently.  No drainage of pus or bleeding.  Has had some tenderness of the skin mostly of her back and shoulders.  No current facility-administered medications for this encounter.  Current Outpatient Medications:    albuterol  (VENTOLIN  HFA) 108 (90 Base) MCG/ACT inhaler, Inhale 1-2 puffs into the lungs every 6 (six) hours as needed. (Patient not taking: Reported on 11/14/2023), Disp: 8 g, Rfl: 0   baclofen  (LIORESAL ) 10 MG tablet, Take 0.5 tablets (5 mg total) by mouth 3 (three) times daily., Disp: 15 each, Rfl: 0   benzonatate  (TESSALON ) 100 MG capsule, Take 1-2 capsules (100-200 mg total) by mouth 3 (three) times daily as needed., Disp: 30 capsule, Rfl: 0   cetirizine  (ZYRTEC ) 10 MG tablet, Take 1 tablet (10 mg total) by mouth daily., Disp: 30 tablet, Rfl: 0   cyclobenzaprine  (FLEXERIL ) 5 MG tablet, Take 1 tablet (5 mg total) by mouth at bedtime as needed. (Patient not taking: Reported on 11/14/2023), Disp: 30 tablet, Rfl: 0   docusate sodium  (COLACE) 100 MG capsule, Take 1 capsule (100 mg total) by mouth every 12 (twelve) hours. (Patient not taking: Reported on 11/14/2023), Disp: 60 capsule, Rfl: 0   escitalopram  (LEXAPRO ) 10 MG tablet, Take 1 tablet (10 mg total) by mouth daily. Take 1/2 tab (5 mg) by mouth once daily for 5 days then increase to 1 tablet by mouth daily, Disp: 90 tablet, Rfl: 0   fluticasone  (FLONASE ) 50 MCG/ACT nasal spray, Place 2 sprays into both nostrils daily., Disp: 16 g,  Rfl: 0   hydrocortisone  1 % ointment, Apply 1 Application topically 2 (two) times daily. (Patient not taking: Reported on 11/14/2023), Disp: 30 g, Rfl: 0   hydrOXYzine  (ATARAX ) 10 MG tablet, Take 1 tablet (10 mg total) by mouth 3 (three) times daily as needed. May take 2 tablets ( 20mg  total) at night for sleep disturbance, Disp: 30 tablet, Rfl: 0   ibuprofen  (ADVIL ) 600 MG tablet, Take 1 tablet (600 mg total) by mouth every 6 (six) hours as needed., Disp: 30 tablet, Rfl: 0   ketoconazole  (NIZORAL ) 2 % shampoo, Apply 1 Application topically 2 (two) times a week., Disp: 120 mL, Rfl: 2   ketoconazole  (NIZORAL ) 2 % shampoo, Apply 1 Application topically 2 (two) times a week., Disp: 120 mL, Rfl: 0   loperamide  (IMODIUM ) 2 MG capsule, Take 2 capsules (4 mg total) by mouth as needed for diarrhea or loose stools. (Patient not taking: Reported on 11/14/2023), Disp: 30 capsule, Rfl: 0   naproxen  (NAPROSYN ) 375 MG tablet, Take 1 tablet (375 mg total) by mouth 2 (two) times daily with a meal. (Patient not taking: Reported on 11/14/2023), Disp: 30 tablet, Rfl: 0   ondansetron  (ZOFRAN -ODT) 4 MG disintegrating tablet, Take 1 tablet (4 mg total) by mouth every 8 (eight) hours as needed for nausea or vomiting. (Patient not taking: Reported on 11/14/2023), Disp: 20 tablet, Rfl: 0   predniSONE  (DELTASONE ) 20 MG tablet, Take 2 tablets daily with breakfast. (Patient not taking: Reported  on 11/14/2023), Disp: 10 tablet, Rfl: 0   promethazine -dextromethorphan  (PROMETHAZINE -DM) 6.25-15 MG/5ML syrup, Take 5 mLs by mouth 3 (three) times daily as needed for cough., Disp: 200 mL, Rfl: 0   scopolamine  (TRANSDERM-SCOP) 1 MG/3DAYS, Place 1 patch (1.5 mg total) onto the skin every 3 (three) days., Disp: 4 patch, Rfl: 0   silver  sulfADIAZINE  (SILVADENE ) 1 % cream, Apply 1 Application topically 2 (two) times daily. (Patient not taking: Reported on 11/14/2023), Disp: 50 g, Rfl: 0   terbinafine  (LAMISIL ) 1 % cream, Apply 1 Application  topically 2 (two) times daily. (Patient not taking: Reported on 11/14/2023), Disp: 30 g, Rfl: 2   zolmitriptan  (ZOMIG -ZMT) 5 MG disintegrating tablet, Take 1 tablet (5 mg total) by mouth once as needed for up to 1 dose for migraine. (Patient not taking: Reported on 11/14/2023), Disp: 10 tablet, Rfl: 0   No Known Allergies  Past Medical History:  Diagnosis Date   Diarrhea 09/24/2022   Medical history non-contributory    Migraine      Past Surgical History:  Procedure Laterality Date   EXCISION MASS LOWER EXTREMETIES Left 12/11/2019   Procedure: EXCISION LEFT THIGH CYST;  Surgeon: Sim Dryer, MD;  Location:  SURGERY CENTER;  Service: General;  Laterality: Left;   LIPOSUCTION MULTIPLE BODY PARTS      Family History  Problem Relation Age of Onset   Healthy Mother    Healthy Father    Breast cancer Maternal Grandmother    Hypertension Maternal Grandmother    Pancreatic cancer Maternal Grandfather    Migraines Maternal Grandfather    Breast cancer Other        Maternal aunt    Social History   Tobacco Use   Smoking status: Never    Passive exposure: Never   Smokeless tobacco: Never   Tobacco comments:    Hooka 2x monthly  Vaping Use   Vaping status: Never Used  Substance Use Topics   Alcohol use: Yes    Comment: occ   Drug use: No    ROS   Objective:   Vitals: BP (P) 118/88 (BP Location: Left Arm)   Pulse (P) 84   Temp (P) 98.3 F (36.8 C) (Oral)   Resp (P) 20   Physical Exam Constitutional:      General: She is not in acute distress.    Appearance: Normal appearance. She is well-developed. She is not ill-appearing, toxic-appearing or diaphoretic.  HENT:     Head: Normocephalic and atraumatic.     Nose: Nose normal.     Mouth/Throat:     Mouth: Mucous membranes are moist.  Eyes:     General: No scleral icterus.       Right eye: No discharge.        Left eye: No discharge.     Extraocular Movements: Extraocular movements intact.   Cardiovascular:     Rate and Rhythm: Normal rate.  Pulmonary:     Effort: Pulmonary effort is normal.  Skin:    General: Skin is warm and dry.     Findings: Rash (Extensive papular patches over the upper back extending toward the shoulder area and also on the flexural surfaces of her forearms; no signs of superficial partial-thickness burns) present.  Neurological:     General: No focal deficit present.     Mental Status: She is alert and oriented to person, place, and time.  Psychiatric:        Mood and Affect: Mood normal.  Behavior: Behavior normal.     Assessment and Plan :   PDMP not reviewed this encounter.  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  Counseled patient on potential for adverse effects with medications prescribed/recommended today, ER and return-to-clinic precautions discussed, patient verbalized understanding.    Adolph Hoop, [place] 11/21/23 1513

## 2023-11-21 NOTE — ED Triage Notes (Signed)
 Pt c/o sunburn all over 2 days ago-taking ibuprofen -NAD-steady gait

## 2023-11-25 ENCOUNTER — Telehealth

## 2023-11-25 ENCOUNTER — Telehealth: Admitting: Family Medicine

## 2023-11-25 DIAGNOSIS — J208 Acute bronchitis due to other specified organisms: Secondary | ICD-10-CM

## 2023-11-25 DIAGNOSIS — J301 Allergic rhinitis due to pollen: Secondary | ICD-10-CM | POA: Diagnosis not present

## 2023-11-25 DIAGNOSIS — R062 Wheezing: Secondary | ICD-10-CM | POA: Diagnosis not present

## 2023-11-25 DIAGNOSIS — J019 Acute sinusitis, unspecified: Secondary | ICD-10-CM

## 2023-11-25 DIAGNOSIS — B9689 Other specified bacterial agents as the cause of diseases classified elsewhere: Secondary | ICD-10-CM

## 2023-11-25 MED ORDER — ALBUTEROL SULFATE HFA 108 (90 BASE) MCG/ACT IN AERS: 2.0000 | INHALATION_SPRAY | Freq: Four times a day (QID) | RESPIRATORY_TRACT | 0 refills | Status: AC | PRN

## 2023-11-25 MED ORDER — AMOXICILLIN 875 MG PO TABS: 875.0000 mg | ORAL_TABLET | Freq: Two times a day (BID) | ORAL | 0 refills | Status: AC

## 2023-11-25 NOTE — Patient Instructions (Signed)

## 2023-11-25 NOTE — Progress Notes (Signed)
 Virtual Visit Consent   [name], you are scheduled for a virtual visit with a [facility] provider today. Just as with appointments in the office, your consent must be obtained to participate. Your consent will be active for this visit and any virtual visit you may have with one of our providers in the next 365 days. If you have a MyChart account, a copy of this consent can be sent to you electronically.  As this is a virtual visit, video technology does not allow for your provider to perform a traditional examination. This may limit your provider's ability to fully assess your condition. If your provider identifies any concerns that need to be evaluated in person or the need to arrange testing (such as labs, EKG, etc.), we will make arrangements to do so. Although advances in technology are sophisticated, we cannot ensure that it will always work on either your end or our end. If the connection with a video visit is poor, the visit may have to be switched to a telephone visit. With either a video or telephone visit, we are not always able to ensure that we have a secure connection.  By engaging in this virtual visit, you consent to the provision of healthcare and authorize for your insurance to be billed (if applicable) for the services provided during this visit. Depending on your insurance coverage, you may receive a charge related to this service.  I need to obtain your verbal consent now. Are you willing to proceed with your visit today? [name] has provided verbal consent on 11/25/2023 for a virtual visit (video or telephone). Albertha Huger, FNP  Date: 11/25/2023 6:19 PM   Virtual Visit via Video Note   I, Albertha Huger, [name]  ([id number], 1990/09/18) on 11/25/23 at  6:15 PM EDT by a video-enabled telemedicine application and verified that I am speaking with the correct person using two identifiers.  Location: Patient: Virtual Visit Location Patient:  Home Provider: Virtual Visit Location Provider: Home Office   I discussed the limitations of evaluation and management by telemedicine and the availability of in person appointments. The patient expressed understanding and agreed to proceed.    History of Present Illness: Sabrina Brennan is a 33 y.o. who identifies as a female who was assigned female at birth, and is being seen today for sinus pressure and pain for over a week, green mucus, cough, wheezing, fever, on allegra and flonase  for allergies, HA, foul taste to mucus. 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   History of abnormal cervical Pap smear 08/28/2020   High grade squamous intraepithelial lesion (HGSIL), grade 3 CIN, 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    Allergies: No Known Allergies Medications:  Current Outpatient Medications:    albuterol  (VENTOLIN  HFA) 108 (90 Base) MCG/ACT inhaler, Inhale 2 puffs into the lungs every 6 (six) hours as needed for wheezing or shortness of breath., Disp: 8 g, Rfl: 0   amoxicillin  (AMOXIL ) 875 MG tablet, Take 1 tablet (875 mg total) by mouth 2 (two) times daily for 10 days., Disp: 20 tablet, Rfl: 0   albuterol  (VENTOLIN  HFA) 108 (90 Base) MCG/ACT inhaler, Inhale 1-2 puffs  into the lungs every 6 (six) hours as needed. (Patient not taking: Reported on 11/14/2023), Disp: 8 g, Rfl: 0   baclofen  (LIORESAL ) 10 MG tablet, Take 0.5 tablets (5 mg total) by mouth 3 (three) times daily., Disp: 15 each, Rfl: 0   benzonatate  (TESSALON ) 100 MG capsule, Take 1-2 capsules (100-200 mg total) by mouth 3 (three) times daily as needed., Disp: 30 capsule, Rfl: 0   cetirizine  (ZYRTEC ) 10 MG tablet, Take 1 tablet (10  mg total) by mouth daily., Disp: 30 tablet, Rfl: 0   cyclobenzaprine  (FLEXERIL ) 5 MG tablet, Take 1 tablet (5 mg total) by mouth at bedtime as needed. (Patient not taking: Reported on 11/14/2023), Disp: 30 tablet, Rfl: 0   docusate sodium  (COLACE) 100 MG capsule, Take 1 capsule (100 mg total) by mouth every 12 (twelve) hours. (Patient not taking: Reported on 11/14/2023), Disp: 60 capsule, Rfl: 0   escitalopram  (LEXAPRO ) 10 MG tablet, Take 1 tablet (10 mg total) by mouth daily. Take 1/2 tab (5 mg) by mouth once daily for 5 days then increase to 1 tablet by mouth daily, Disp: 90 tablet, Rfl: 0   fluticasone  (FLONASE ) 50 MCG/ACT nasal spray, Place 2 sprays into both nostrils daily., Disp: 16 g, Rfl: 0   hydrocortisone  1 % ointment, Apply 1 Application topically 2 (two) times daily. (Patient not taking: Reported on 11/14/2023), Disp: 30 g, Rfl: 0   hydrOXYzine  (ATARAX ) 10 MG tablet, Take 1 tablet (10 mg total) by mouth 3 (three) times daily as needed. May take 2 tablets ( 20mg  total) at night for sleep disturbance, Disp: 30 tablet, Rfl: 0   ibuprofen  (ADVIL ) 600 MG tablet, Take 1 tablet (600 mg total) by mouth every 6 (six) hours as needed., Disp: 30 tablet, Rfl: 0   ketoconazole  (NIZORAL ) 2 % shampoo, Apply 1 Application topically 2 (two) times a week., Disp: 120 mL, Rfl: 2   ketoconazole  (NIZORAL ) 2 % shampoo, Apply 1 Application topically 2 (two) times a week., Disp: 120 mL, Rfl: 0   loperamide  (IMODIUM ) 2 MG capsule, Take 2 capsules (4 mg total) by mouth as needed for diarrhea or loose stools. (Patient not taking: Reported on 11/14/2023), Disp: 30 capsule, Rfl: 0   naproxen  (NAPROSYN ) 375 MG tablet, Take 1 tablet (375 mg total) by mouth 2 (two) times daily with a meal. (Patient not taking: Reported on 11/14/2023), Disp: 30 tablet, Rfl: 0   ondansetron  (ZOFRAN -ODT) 4 MG disintegrating tablet, Take 1 tablet (4 mg total) by mouth every 8 (eight) hours as needed for nausea or vomiting. (Patient not taking:  Reported on 11/14/2023), Disp: 20 tablet, Rfl: 0   predniSONE  (DELTASONE ) 20 MG tablet, Take 2 tablets daily with breakfast., Disp: 10 tablet, Rfl: 0   promethazine -dextromethorphan  (PROMETHAZINE -DM) 6.25-15 MG/5ML syrup, Take 5 mLs by mouth 3 (three) times daily as needed for cough., Disp: 200 mL, Rfl: 0   scopolamine  (TRANSDERM-SCOP) 1 MG/3DAYS, Place 1 patch (1.5 mg total) onto the skin every 3 (three) days., Disp: 4 patch, Rfl: 0   silver  sulfADIAZINE  (SILVADENE ) 1 % cream, Apply 1 Application topically 2 (two) times daily. (Patient not taking: Reported on 11/14/2023), Disp: 50 g, Rfl: 0   terbinafine  (LAMISIL ) 1 % cream, Apply 1 Application topically 2 (two) times daily. (Patient not taking: Reported on 11/14/2023), Disp: 30 g, Rfl: 2   zolmitriptan  (ZOMIG -ZMT) 5 MG disintegrating tablet, Take 1 tablet (5 mg total) by mouth once as needed for up to 1 dose for migraine. (Patient not taking: Reported on  11/14/2023), Disp: 10 tablet, Rfl: 0  Observations/Objective: Patient is well-developed, well-nourished in no acute distress.  Resting comfortably  at home.  Head is normocephalic, atraumatic.  No labored breathing.  Speech is clear and coherent with logical content.  Patient is alert and oriented at baseline.    Assessment and Plan: 1. Acute bacterial sinusitis (Primary)  2. Allergic rhinitis due to pollen, unspecified seasonality  3. Wheezing  4. Acute bacterial bronchitis  Increase fluids, humidifier at night, ibuprofen  as directed, UC if sx worsen. Continue allergy meds.   Follow Up Instructions: I discussed the assessment and treatment plan with the patient. The patient was provided an opportunity to ask questions and all were answered. The patient agreed with the plan and demonstrated an understanding of the instructions.  A copy of instructions were sent to the patient via MyChart unless otherwise noted below.     The patient was advised to call back or seek an in-person  evaluation if the symptoms worsen or if the condition fails to improve as anticipated.    Shaunda Tipping, FNP

## 2023-11-28 ENCOUNTER — Ambulatory Visit (HOSPITAL_BASED_OUTPATIENT_CLINIC_OR_DEPARTMENT_OTHER): Admitting: Family

## 2023-11-28 VITALS — BP 129/86 | HR 76 | Ht 59.0 in | Wt 180.0 lb

## 2023-11-28 DIAGNOSIS — F4323 Adjustment disorder with mixed anxiety and depressed mood: Secondary | ICD-10-CM | POA: Diagnosis not present

## 2023-11-28 MED ORDER — ESCITALOPRAM OXALATE 10 MG PO TABS: 10.0000 mg | ORAL_TABLET | Freq: Every day | ORAL | 0 refills | Status: DC

## 2023-11-28 MED ORDER — PROPRANOLOL HCL ER 60 MG PO CP24: 60.0000 mg | ORAL_CAPSULE | Freq: Once | ORAL | 0 refills | Status: DC | PRN

## 2023-11-28 MED ORDER — PROPRANOLOL HCL 10 MG PO TABS: 10.0000 mg | ORAL_TABLET | Freq: Three times a day (TID) | ORAL | 0 refills | Status: DC

## 2023-11-28 NOTE — Progress Notes (Signed)
 [facility] MD/PA/NP OP Progress Note  11/28/2023 4:25 PM Sabrina Brennan  MRN:  [id number]  Chief Complaint: Medication management  HPI: Sabrina Brennan 33 year old African-American female presents for follow-up open medication management.  She was initiated on Lexapro  and hydroxyzine  for mood stabilization.  She continues to report depression symptoms mainly related to diagnosis with her grandmother.  States they have found out the the cancer spread to her grandmother's liver.  States she has ongoing symptoms of worry related to her grandmother's health. Stated that she hasn't rested well for the past 3 nights.   Sabrina Brennan states overall she has been taking and tolerating medications well.  States she has noticed a change in her mood.  States her family has also commented on how she has been "calm and relaxed" over the past few weeks.  Discussed initiating propranolol  for anxiety as she reports upcoming test that she needs to complete by Wednesday of this week.  States this is the third time she has attempted take this exam.  She attributes her symptoms to increased stress and anxiety.  Will make 10 tablets available.  Patient has upcoming therapy appointment with Rosalynd Combs.  No other documented concerns at this visit.  Support encouragement reassurance was provided.  Patient to follow-up 3 months.   Sabrina Brennan is sitting flat, but pleasant; she denies that she has noticed any personality changes. she is alert/oriented x 4; calm/cooperative; and mood congruent with affect.  Patient is speaking in a clear tone at moderate volume, and normal pace; no concerns related to suicidal or homicidal ideations.  Patient has remained calm throughout assessment and has answered questions appropriately.   Visit Diagnosis:    ICD-10-CM   1. Adjustment disorder with mixed anxiety and depressed mood  F43.23       Past Psychiatric History:   Past Medical History:  Past Medical History:  Diagnosis Date   Diarrhea  09/24/2022   Medical history non-contributory    Migraine     Past Surgical History:  Procedure Laterality Date   EXCISION MASS LOWER EXTREMETIES Left 12/11/2019   Procedure: EXCISION LEFT THIGH CYST;  Surgeon: Sim Dryer, MD;  Location: [facility];  Service: General;  Laterality: Left;   LIPOSUCTION MULTIPLE BODY PARTS      Family Psychiatric History:   Family History:  Family History  Problem Relation Age of Onset   Healthy Mother    Healthy Father    Breast cancer Maternal Grandmother    Hypertension Maternal Grandmother    Pancreatic cancer Maternal Grandfather    Migraines Maternal Grandfather    Breast cancer Other        Maternal aunt    Social History:  Social History   Socioeconomic History   Marital status: Single    Spouse name: Not on file   Number of children: Not on file   Years of education: Not on file   Highest education level: Bachelor's degree (e.g., BA, AB, BS)  Occupational History   Not on file  Tobacco Use   Smoking status: Never    Passive exposure: Never   Smokeless tobacco: Never   Tobacco comments:    Hooka 2x monthly  Vaping Use   Vaping status: Never Used  Substance and Sexual Activity   Alcohol use: Yes    Comment: occ   Drug use: No   Sexual activity: Yes    Birth control/protection: Injection  Other Topics Concern   Not on file  Social History  Narrative   Lives with her mom   Right handed   Caffeine : 2 liter pepsi/day      Patient reports she is under family stress. Will be seeking help from counselor.    Social Drivers of Corporate investment banker Strain: Low Risk  (07/28/2023)   Overall Financial Resource Strain (CARDIA)    Difficulty of Paying Living Expenses: Not very hard  Food Insecurity: No Food Insecurity (07/28/2023)   Hunger Vital Sign    Worried About Running Out of Food in the Last Year: Never true    Ran Out of Food in the Last Year: Never true  Transportation Needs: No  Transportation Needs (07/28/2023)   PRAPARE - Administrator, Civil Service (Medical): No    Lack of Transportation (Non-Medical): No  Physical Activity: Unknown (07/28/2023)   Exercise Vital Sign    Days of Exercise per Week: 0 days    Minutes of Exercise per Session: Not on file  Stress: Stress Concern Present (07/28/2023)   [employer] of Occupational Health - Occupational Stress Questionnaire    Feeling of Stress : Very much  Social Connections: Socially Isolated (07/28/2023)   Social Connection and Isolation Panel [NHANES]    Frequency of Communication with Friends and Family: More than three times a week    Frequency of Social Gatherings with Friends and Family: Twice a week    Attends Religious Services: Never    Database administrator or Organizations: No    Attends Engineer, structural: Not on file    Marital Status: Never married    Allergies: No Known Allergies  Metabolic Disorder Labs: Lab Results  Component Value Date   HGBA1C 5.3 04/30/2022   No results found for: "PROLACTIN" No results found for: "CHOL", "TRIG", "HDL", "CHOLHDL", "VLDL", "LDLCALC" Lab Results  Component Value Date   TSH 0.764 05/14/2022   TSH 0.348 (L) 04/30/2022    Therapeutic Level Labs: No results found for: "LITHIUM" No results found for: "VALPROATE" No results found for: "CBMZ"  Current Medications: Current Outpatient Medications  Medication Sig Dispense Refill   escitalopram  (LEXAPRO ) 10 MG tablet Take 1 tablet (10 mg total) by mouth daily. 60 tablet 0   propranolol  (INDERAL ) 10 MG tablet Take 1 tablet (10 mg total) by mouth 3 (three) times daily. 10 tablet 0   albuterol  (VENTOLIN  HFA) 108 (90 Base) MCG/ACT inhaler Inhale 1-2 puffs into the lungs every 6 (six) hours as needed. (Patient not taking: Reported on 11/14/2023) 8 g 0   albuterol  (VENTOLIN  HFA) 108 (90 Base) MCG/ACT inhaler Inhale 2 puffs into the lungs every 6 (six) hours as needed for wheezing or  shortness of breath. 8 g 0   amoxicillin  (AMOXIL ) 875 MG tablet Take 1 tablet (875 mg total) by mouth 2 (two) times daily for 10 days. 20 tablet 0   baclofen  (LIORESAL ) 10 MG tablet Take 0.5 tablets (5 mg total) by mouth 3 (three) times daily. 15 each 0   benzonatate  (TESSALON ) 100 MG capsule Take 1-2 capsules (100-200 mg total) by mouth 3 (three) times daily as needed. 30 capsule 0   cetirizine  (ZYRTEC ) 10 MG tablet Take 1 tablet (10 mg total) by mouth daily. 30 tablet 0   cyclobenzaprine  (FLEXERIL ) 5 MG tablet Take 1 tablet (5 mg total) by mouth at bedtime as needed. (Patient not taking: Reported on 11/14/2023) 30 tablet 0   docusate sodium  (COLACE) 100 MG capsule Take 1 capsule (100 mg total) by mouth  every 12 (twelve) hours. (Patient not taking: Reported on 11/14/2023) 60 capsule 0   fluticasone  (FLONASE ) 50 MCG/ACT nasal spray Place 2 sprays into both nostrils daily. 16 g 0   hydrocortisone  1 % ointment Apply 1 Application topically 2 (two) times daily. (Patient not taking: Reported on 11/14/2023) 30 g 0   hydrOXYzine  (ATARAX ) 10 MG tablet Take 1 tablet (10 mg total) by mouth 3 (three) times daily as needed. May take 2 tablets ( 20mg  total) at night for sleep disturbance 30 tablet 0   ibuprofen  (ADVIL ) 600 MG tablet Take 1 tablet (600 mg total) by mouth every 6 (six) hours as needed. 30 tablet 0   ketoconazole  (NIZORAL ) 2 % shampoo Apply 1 Application topically 2 (two) times a week. 120 mL 2   ketoconazole  (NIZORAL ) 2 % shampoo Apply 1 Application topically 2 (two) times a week. 120 mL 0   loperamide  (IMODIUM ) 2 MG capsule Take 2 capsules (4 mg total) by mouth as needed for diarrhea or loose stools. (Patient not taking: Reported on 11/14/2023) 30 capsule 0   naproxen  (NAPROSYN ) 375 MG tablet Take 1 tablet (375 mg total) by mouth 2 (two) times daily with a meal. (Patient not taking: Reported on 11/14/2023) 30 tablet 0   ondansetron  (ZOFRAN -ODT) 4 MG disintegrating tablet Take 1 tablet (4 mg total) by  mouth every 8 (eight) hours as needed for nausea or vomiting. (Patient not taking: Reported on 11/14/2023) 20 tablet 0   predniSONE  (DELTASONE ) 20 MG tablet Take 2 tablets daily with breakfast. 10 tablet 0   promethazine -dextromethorphan  (PROMETHAZINE -DM) 6.25-15 MG/5ML syrup Take 5 mLs by mouth 3 (three) times daily as needed for cough. 200 mL 0   scopolamine  (TRANSDERM-SCOP) 1 MG/3DAYS Place 1 patch (1.5 mg total) onto the skin every 3 (three) days. 4 patch 0   silver  sulfADIAZINE  (SILVADENE ) 1 % cream Apply 1 Application topically 2 (two) times daily. (Patient not taking: Reported on 11/14/2023) 50 g 0   terbinafine  (LAMISIL ) 1 % cream Apply 1 Application topically 2 (two) times daily. (Patient not taking: Reported on 11/14/2023) 30 g 2   zolmitriptan  (ZOMIG -ZMT) 5 MG disintegrating tablet Take 1 tablet (5 mg total) by mouth once as needed for up to 1 dose for migraine. (Patient not taking: Reported on 11/14/2023) 10 tablet 0   No current facility-administered medications for this visit.     Musculoskeletal: Strength & Muscle Tone: within normal limits Gait & Station: normal Patient leans: N/A  Psychiatric Specialty Exam: Review of Systems  Psychiatric/Behavioral:  Negative for decreased concentration and sleep disturbance. Agitation: improved.The patient is nervous/anxious.   All other systems reviewed and are negative.   Blood pressure 129/86, pulse 76, height 4\' 11"  (1.499 m), weight 180 lb (81.6 kg).Body mass index is 36.36 kg/m.  General Appearance: Casual  Eye Contact:  Good  Speech:  Clear and Coherent  Volume:  Normal  Mood:  Anxious and Depressed  Affect:  Congruent  Thought Process:  Coherent  Orientation:  Full (Time, Place, and Person)  Thought Content: Logical   Suicidal Thoughts:  No  Homicidal Thoughts:  No  Memory:  Immediate;   Good Recent;   Good  Judgement:  Good  Insight:  Good  Psychomotor Activity:  Normal  Concentration:  Concentration: Good  Recall:   Good  Fund of Knowledge: Good  Language: Good  Akathisia:  No  Handed:  Right  AIMS (if indicated): not done  Assets:  Communication Skills Desire for Improvement Resilience Social Support  ADL's:  Intact  Cognition: WNL  Sleep:  Good   Screenings: AUDIT    Flowsheet Row Appointment from 08/01/2023 in [facility] Of [place]. [facility] Appointment from 10/25/2022 in [facility] Of [facility]. [facility]  Alcohol Use Disorder Identification Test Final Score (AUDIT) 14  3      GAD-7    Flowsheet Row [facility]  Total GAD-7 Score 0      PHQ2-9    Flowsheet Row Office Visit from [facility]/14/2023 in [facility] Office Visit from 09/09/2023 in [facility] Of [place]. [facility] Office Visit from 08/22/2023 in [facility] - [facility] Of [place]. [facility] Office Visit from 10/12/2022 in [facility] - [facility] Of [facility]. [facility] Office Visit from 09/24/2022 in [facility] Of [facility]. [facility]  PHQ-2 Total Score 2 0 0 0 0  PHQ-9 Total Score 13 0 5 0 0      Flowsheet Row ED from 11/21/2023 in [facility] ED from 07/14/2023 in [facility] at [facility] ED from 07[facility]04/2023 in [facility])  C-SSRS RISK CATEGORY No Risk No Risk No Risk        Assessment and Plan: Keyira Stahl 33 year old African-American female presents for medication management follow-up appointment.  She was initiated on Lexapro  and hydroxyzine  for mood stabilization.  States she has been taking and tolerating medications well.  Denying any medication side effects at this time.  Discussed continue medications as  indicated.  Follow-up 3 months for medication follow-up appointment.  Initiated propranolol  10 mg p.o. as needed for reported test anxiety.  Patient is amendable to taking medications at this time.  Patient to keep follow-up appointment with therapist Rosalynd Combs on 11/29/2023.  No other concerns noted at this visit.  Collaboration of Care: Collaboration of Care: Medication Management AEB Continue Lexapro  10 mg daily, started Propanolol 10 mg PRN   Patient/Guardian was advised Release of Information must be obtained prior to any record release in order to collaborate their care with an outside provider. Patient/Guardian was advised if they have not already done so to contact the registration department to sign all necessary forms in order for us  to release information regarding their care.   Consent: Patient/Guardian gives verbal consent for treatment and assignment of benefits for services provided during this visit. Patient/Guardian expressed understanding and agreed to proceed.    Levester Reagin, NP 11/28/2023, 4:25 PM

## 2023-12-05 ENCOUNTER — Ambulatory Visit (INDEPENDENT_AMBULATORY_CARE_PROVIDER_SITE_OTHER): Admitting: Licensed Clinical Social Worker

## 2023-12-05 DIAGNOSIS — F4323 Adjustment disorder with mixed anxiety and depressed mood: Secondary | ICD-10-CM
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
## 2023-12-06 ENCOUNTER — Encounter (HOSPITAL_COMMUNITY): Payer: Self-pay

## 2023-12-06 ENCOUNTER — Ambulatory Visit (HOSPITAL_COMMUNITY): Admitting: Licensed Clinical Social Worker

## 2023-12-19 ENCOUNTER — Encounter (HOSPITAL_COMMUNITY): Payer: Self-pay

## 2023-12-19 ENCOUNTER — Ambulatory Visit (INDEPENDENT_AMBULATORY_CARE_PROVIDER_SITE_OTHER): Admitting: Licensed Clinical Social Worker

## 2023-12-19 DIAGNOSIS — F4323 Adjustment disorder with mixed anxiety and depressed mood: Secondary | ICD-10-CM | POA: Diagnosis not present

## 2023-12-19 NOTE — Progress Notes (Signed)
 THERAPIST PROGRESS NOTE   Session Date: 12/19/2023  Session Time: 1603 - 1657  Virtual Visit via Video Note  I connected with Sabrina Brennan on 12/19/23 at  4:00 PM EDT by a video enabled telemedicine application and verified that I am speaking with the correct person using two identifiers.  Location: Patient: Home Provider: Home Office   I discussed the limitations of evaluation and management by telemedicine and the availability of in person appointments. The patient expressed understanding and agreed to proceed.  The patient was advised to call back or seek an in-person evaluation if the symptoms worsen or if the condition fails to improve as anticipated.  I provided 53 minutes of non-face-to-face time during this encounter.  Participation Level: Active  Behavioral Response: CasualAlertAnxious and Depressed  Type of Therapy: Individual Therapy  Treatment Goals addressed:  - Sabrina Brennan will reduce the amount of anger-related incidents/outbursts by 50% as evidenced by self-report (Anger Management) - Sabrina Brennan will identify situations, thoughts, and feelings that trigger internal anger, and/or angry/aggressive actions as evidenced by self-report (Anger Management) - Reduce frequency, intensity, and duration of depression symptoms so that daily functioning is improved (OP Depression) - Increase coping skills to manage depression and improve ability to perform daily activities (OP Depression) - "Try to improve management of moods, and how I project them" (OP Depression)  Progress Towards Goals: Initial  Interventions: CBT, Motivational Interviewing, Solution Focused, and Supportive  Summary: Sabrina Brennan is a 33 y.o. female with past psych history of Adjustment disorder with mixed anxiety and depressed mood, presenting for follow-up therapy session in efforts to improve management of presenting depressive and anxious symptoms.  Patient actively engaged in session, presenting in variable  moods, appearing pleasant overall, with increased irritability and flat affect when processing recent events. Pt actively engaged in introductory check-in, providing reflections of recent events and interactions between she and siblings, proving to be increasingly conflictual, leading to being argumentative and aggressive with siblings, and then blocking them. Pt engaged in processing purpose of engaging in such behaviors and exploring continued impact on relationships. Pt expressed trying to work on challenges, by trying to not be so angry, talking to grandparents and finding self noticing improved moods. Reports recent conflict with boyfriend also surrounding lack of communication, leading to being passive aggressive towards him. Actively engaged in reassessing presenting depressive and anxious sxs via PHQ-9 and GAD-7, further exploring variances in scores, and developing of individualized goals to be included in tx plan towards management of presenting depressive and anxious sxs.  Patient responded well to interventions. Patient continues to meet criteria for Adjustment disorder with mixed anxiety and depressed mood. Patient will continue to benefit from engagement in outpatient therapy due to being the least restrictive service to meet presenting needs.      12/19/2023    4:07 PM 12/05/2023    8:11 AM 05/28/2019    9:31 AM  GAD 7 : Generalized Anxiety Score  Nervous, Anxious, on Edge 0 1 0  Control/stop worrying 1 1 0  Worry too much - different things 0 1 0  Trouble relaxing 1 1 0  Restless 1 0 0  Easily annoyed or irritable 3 1 0  Afraid - awful might happen 0 0 0  Total GAD 7 Score 6 5 0  Anxiety Difficulty Very difficult Somewhat difficult       12/19/2023    4:29 PM 12/05/2023    8:13 AM 11/14/2023    9:04 AM 09/09/2023    3:53 PM  08/22/2023    2:28 PM  Depression screen PHQ 2/9  Decreased Interest 1 1 1  0 0  Down, Depressed, Hopeless 1 2 1  0 0  PHQ - 2 Score 2 3 2  0 0  Altered sleeping  0 2 2 0 2  Tired, decreased energy 2 1 2  0 2  Change in appetite 2 3 3  0 0  Feeling bad or failure about yourself  0 0 0 0 0  Trouble concentrating 1 2 3  0 1  Moving slowly or fidgety/restless 0 0 1 0 0  Suicidal thoughts 0 0 0 0 0  PHQ-9 Score 7 11 13  0 5  Difficult doing work/chores Somewhat difficult Somewhat difficult Very difficult     Flowsheet Row UC from 11/21/2023 in [facility] at [employer]) UC from 07/14/2023 in [facility] at [employer]) UC from 07/04/2023 in [facility])  C-SSRS RISK CATEGORY No Risk No Risk No Risk        Suicidal/Homicidal: Nowithout intent/plan  Therapist Response:    Clinician utilized CBT, MI, Solution Focused, and supportive reflection interventions to support patient in efforts to address presenting sxs and challenges surrounding presenting stressors.  Clinician actively greeted pt upon presenting for today's virtual visit, engaging pt in introductory check-in, assessing presenting moods and affect, and further prompting patient's recounts of daily and recent events, and factors contributing to presenting moods. Actively listened to patient's reflections of recent events, exploring recent challenges and stressors as well as pt's individual efforts at improving management of symptoms, utilizing open-ended questions to further elicit patient's thoughts and feelings surrounding attempts and observed improvements. Actively engaged pt in re-administering of PHQ-9 and GAD-7, further involving in processing of scores, noted trends, and exploration of variances in reported sxs. Utilized socratic questioning in engaging pt in exploration of areas for continued work in Counsellor of individualized tx goals to be contained within tx plan.  Clinician reassessed severity of presenting sxs, and presence of any safety concerns. Clinician provided support and empathy  to patient during session.  Plan: Return again in 2 weeks.  Diagnosis:  Encounter Diagnosis  Name Primary?   Adjustment disorder with mixed anxiety and depressed mood Yes    Collaboration of Care: Psychiatrist AEB provider documentation available in EHR.  Patient/Guardian was advised Release of Information must be obtained prior to any record release in order to collaborate their care with an outside provider. Patient/Guardian was advised if they have not already done so to contact the registration department to sign all necessary forms in order for us  to release information regarding their care.   Consent: Patient/Guardian gives verbal consent for treatment and assignment of benefits for services provided during this visit. Patient/Guardian expressed understanding and agreed to proceed.   Patsi Boots, MSW, LCSW 12/19/2023,  4:32 PM

## 2023-12-30 ENCOUNTER — Telehealth: Admitting: Family Medicine

## 2023-12-30 DIAGNOSIS — T304 Corrosion of unspecified body region, unspecified degree: Secondary | ICD-10-CM | POA: Diagnosis not present

## 2023-12-30 MED ORDER — IBUPROFEN 600 MG PO TABS: 600.0000 mg | ORAL_TABLET | Freq: Four times a day (QID) | ORAL | 0 refills | Status: DC | PRN

## 2023-12-30 MED ORDER — PREDNISONE 20 MG PO TABS: 20.0000 mg | ORAL_TABLET | Freq: Two times a day (BID) | ORAL | 0 refills | Status: AC
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
## 2023-12-30 NOTE — Patient Instructions (Signed)
 Chemical Burn, Adult A chemical burn is a burn caused by chemicals that damage and kill tissue (caustic chemicals). These may be found in fertilizers, household cleaners, hair relaxers, battery acid, paint remover, and drain cleaners. If you get a chemical burn, get help right away. The chemicals can keep causing damage even after they have been removed from your skin. What are the causes? You can get a chemical burn if you swallow, breathe in, touch, or are touched by caustic chemicals. What increases the risk? You are more likely to get a chemical burn if you work in: Set designer. Medicine. Farming. Mining. These places are more likely to make, store, and use caustic chemicals. What are the signs or symptoms?  Common symptoms of a chemical burn include: Your skin changing color. It may lose color (blanch), turn red, or turn darker. Blisters on your skin. Rash. Dry, flaky skin. A type of acne called chloracne. Burning or aching pain. Itching. If the chemicals were breathed in (inhaled), you may have: Eye or nose irritation. Sore throat. Coughing. If the chemicals were swallowed or went into your body through a wound, they can damage: Organs, such as the liver, kidneys, or bladder. Body systems. These include the body's defense system (immune system) and the nose, throat, windpipe, and lungs (respiratory system). Later symptoms may include: Scarring. Shrinking of the skin. A long-term change in skin color. How is this diagnosed? A chemical burn may be diagnosed with a medical history and physical exam. Your health care provider will check if you have a first-degree, second-degree, or third-degree burn.  You may also have tests done. These may include: Blood tests. Pee (urine) tests. An electrocardiogram (ECG). This checks your heart. A chest X-ray. If you got chemicals in your eyes, you may need to go to an expert in treating eyes (ophthalmologist or optometrist). If you  swallowed chemicals, you may need an endoscopy. This is a procedure that checks inside your body for problems. How is this treated? Treatment for a chemical burn starts by removing the caustic chemicals. Your skin will be washed or brushed. Your clothes may need to be removed. After the chemicals are gone, you may need: Oxygen to help you breathe. Antibiotics. Pain medicine. Fluids through an IV. Bandages (dressings). Debridement. This is a procedure to remove dead tissue. A tetanus shot. Long-term burn care may include: Breathing support. You may be given oxygen using a machine (ventilator). Wound dressing changes. Antibiotics. Surgery. Physical therapy. Certain chemicals should not be rinsed with water. These include some acids, dry powders, and metallic compounds. Make sure you know what chemicals you came into contact with. This can help you get the right treatment. Follow these instructions at home: Medicines Take and apply over-the-counter and prescription medicines only as told by your provider. If you were prescribed antibiotics, take or apply them as told by your provider. Do not stop using the antibiotic even if you start to feel better. Bathing Do not take baths, swim, or use a hot tub until your provider approves. Ask your provider if you may take showers. You may only be allowed to take sponge baths. Keep all dressings dry until your provider says they can be removed. Burn care Follow instructions from your provider about how to take care of your burn. Make sure you: Wash your hands with soap and water for at least 20 seconds before and after you change your dressings. If soap and water are not available, use hand sanitizer. Change or remove your  dressings as told by your provider. Clean your burn as told by your provider. Check your burn every day for signs of infection. Check for: More redness, swelling, or pain. Fluid or blood. Warmth. Pus or a bad smell. Do not put  ice on your burn. This can cause more damage. Do not put butter, oil, or other home remedies on your burn. Do not scratch or pick at the burn. Do not break any blisters you may have. Do not peel any skin. Protect your burn from the sun. Activity Rest as told by your provider. Do not exercise until your provider says that you can. Do range-of-motion movements, if told by your provider. General instructions Drink enough fluid to keep your pee pale yellow. Do not use any products that contain nicotine or tobacco. These products include cigarettes, chewing tobacco, and vaping devices, such as e-cigarettes. These can delay healing. If you need help quitting, ask your provider. Raise (elevate) the injured area above the level of your heart while you are sitting or lying down. How is this prevented? Stay away from caustic chemicals. If you must use or be near caustic chemicals: Wear protective gloves and equipment. Make sure the chemicals are labeled. Make sure there is proper airflow in the area. Keep your skin clean and moisturized. If you use caustic chemicals at work: Ask your employer if the chemicals can be replaced with others that are less harmful. Look at the safety data sheets (SDS). These can tell you what type of personal protective equipment you need and what to do if you are exposed to the chemicals. Contact a health care provider if: You get a tetanus shot and have: Swelling. Severe pain. Redness. Bleeding. Your symptoms do not get better with treatment. Your pain does not get better with medicine. You have any signs of infection. Your arms or legs feel numb or tingle. The burned area feels numb or tingles. Get help right away if: You have severe swelling. You have severe pain. You have trouble breathing or start coughing. You have chest pain. These symptoms may be an emergency. Get help right away. Call 911. Do not wait to see if the symptoms will go away. Do not  drive yourself to the hospital. This information is not intended to replace advice given to you by your health care provider. Make sure you discuss any questions you have with your health care provider. Document Revised: 08/04/2022 Document Reviewed: 08/04/2022 Elsevier Patient Education  2024 [employer].

## 2024-01-05 ENCOUNTER — Encounter (HOSPITAL_COMMUNITY): Payer: Self-pay

## 2024-01-05 ENCOUNTER — Ambulatory Visit (HOSPITAL_COMMUNITY): Admitting: Licensed Clinical Social Worker

## 2024-01-06 ENCOUNTER — Telehealth: Admitting: Physician Assistant

## 2024-01-06 DIAGNOSIS — M546 Pain in thoracic spine: Secondary | ICD-10-CM | POA: Diagnosis not present

## 2024-01-06 MED ORDER — METHOCARBAMOL 500 MG PO TABS: 500.0000 mg | ORAL_TABLET | Freq: Four times a day (QID) | ORAL | 0 refills | Status: AC

## 2024-01-06 NOTE — Progress Notes (Signed)
 Virtual Visit Consent   [name], you are scheduled for a virtual visit with a [facility] provider today. Just as with appointments in the office, your consent must be obtained to participate. Your consent will be active for this visit and any virtual visit you may have with one of our providers in the next 365 days. If you have a MyChart account, a copy of this consent can be sent to you electronically.  As this is a virtual visit, video technology does not allow for your provider to perform a traditional examination. This may limit your provider's ability to fully assess your condition. If your provider identifies any concerns that need to be evaluated in person or the need to arrange testing (such as labs, EKG, etc.), we will make arrangements to do so. Although advances in technology are sophisticated, we cannot ensure that it will always work on either your end or our end. If the connection with a video visit is poor, the visit may have to be switched to a telephone visit. With either a video or telephone visit, we are not always able to ensure that we have a secure connection.  By engaging in this virtual visit, you consent to the provision of healthcare and authorize for your insurance to be billed (if applicable) for the services provided during this visit. Depending on your insurance coverage, you may receive a charge related to this service.  I need to obtain your verbal consent now. Are you willing to proceed with your visit today? Sabrina Brennan has provided verbal consent on 01/06/2024 for a virtual visit (video or telephone). Marciana Settle, [place]  Date: 01/06/2024 7:44 PM   Virtual Visit via Video Note   I, Marciana Settle, [name]  ([id number], 25-Jun-1991) on 01/06/24 at  7:45 PM EDT by a video-enabled telemedicine application and verified that I am speaking with the correct person using two identifiers.  Location: Patient: Virtual Visit Location Patient:  Home Provider: Virtual Visit Location Provider: Home Office   I discussed the limitations of evaluation and management by telemedicine and the availability of in person appointments. The patient expressed understanding and agreed to proceed.    History of Present Illness: Sabrina Brennan is a 33 y.o. who identifies as a female who was assigned female at birth, and is being seen today for mid and lower back pain. [name]  HPI: Back Pain This is a new problem. The current episode started yesterday. The problem occurs constantly. The problem is unchanged. The pain is present in the lumbar spine and thoracic spine. The pain is at a severity of 10/10. The pain is moderate. The pain is The same all the time. The symptoms are aggravated by bending. Pertinent negatives include no abdominal pain, bladder incontinence, bowel incontinence, chest pain, dysuria, fever, headaches, leg pain, numbness, paresis, paresthesias, pelvic pain, perianal numbness, tingling, weakness or weight loss. She has tried NSAIDs for the symptoms. The treatment provided moderate relief.    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 (Patient not taking: Reported on 11/14/2023), Disp: 8 g, Rfl: 0   albuterol  (VENTOLIN  HFA) 108 (90 Base) MCG/ACT inhaler, Inhale 2 puffs into the lungs every 6 (six) hours  as needed for wheezing or shortness of breath., Disp: 8 g, Rfl: 0   baclofen  (LIORESAL ) 10 MG tablet, Take 0.5 tablets (5 mg total) by mouth 3 (three) times daily., Disp: 15 each, Rfl: 0   benzonatate  (TESSALON ) 100 MG capsule, Take 1-2 capsules (100-200 mg total) by mouth 3 (three) times daily as needed., Disp: 30 capsule, Rfl: 0   cetirizine  (ZYRTEC ) 10 MG tablet, Take 1 tablet (10 mg total) by mouth daily., Disp: 30 tablet, Rfl: 0   cyclobenzaprine  (FLEXERIL ) 5 MG tablet, Take 1 tablet (5 mg total) by mouth at bedtime as needed. (Patient not taking: Reported on 11/14/2023), Disp: 30 tablet, Rfl: 0   docusate sodium  (COLACE) 100 MG capsule, Take 1 capsule (100 mg total) by mouth every 12 (twelve) hours. (Patient not taking: Reported on 11/14/2023), Disp: 60 capsule, Rfl: 0   escitalopram  (LEXAPRO ) 10 MG tablet, Take 1 tablet (10 mg total) by mouth daily., Disp: 60 tablet, Rfl: 0   fluticasone  (FLONASE ) 50 MCG/ACT nasal spray, Place 2 sprays into both nostrils daily., Disp: 16 g, Rfl: 0   hydrocortisone  1 % ointment, Apply 1 Application topically 2 (two) times daily. (Patient not taking: Reported on 11/14/2023), Disp: 30 g, Rfl: 0   hydrOXYzine  (ATARAX ) 10 MG tablet, Take 1 tablet (10 mg total) by mouth 3 (three) times daily as needed. May take 2 tablets ( 20mg  total) at night for sleep disturbance, Disp: 30 tablet, Rfl: 0   ibuprofen  (ADVIL ) 600 MG tablet, Take 1 tablet (600 mg total) by mouth every 6 (six) hours as needed., Disp: 30 tablet, Rfl: 0   ketoconazole  (NIZORAL ) 2 % shampoo, Apply 1 Application topically 2 (two) times a week., Disp: 120 mL, Rfl: 2   ketoconazole  (NIZORAL ) 2 % shampoo, Apply 1 Application topically 2 (two) times a week., Disp: 120 mL, Rfl: 0   loperamide  (IMODIUM ) 2 MG capsule, Take 2 capsules (4 mg total) by mouth as needed for diarrhea or loose stools. (Patient not taking: Reported on 11/14/2023), Disp: 30 capsule, Rfl: 0   naproxen  (NAPROSYN ) 375 MG tablet, Take 1 tablet  (375 mg total) by mouth 2 (two) times daily with a meal. (Patient not taking: Reported on 11/14/2023), Disp: 30 tablet, Rfl: 0   ondansetron  (ZOFRAN -ODT) 4 MG disintegrating tablet, Take 1 tablet (4 mg total) by mouth every 8 (eight) hours as needed for nausea or vomiting. (Patient not taking: Reported on 11/14/2023), Disp: 20 tablet, Rfl: 0   predniSONE  (DELTASONE ) 20 MG tablet, Take 2 tablets daily with breakfast., Disp: 10 tablet, Rfl: 0   promethazine -dextromethorphan  (PROMETHAZINE -DM) 6.25-15 MG/5ML syrup, Take 5 mLs by mouth 3 (three) times daily as needed for cough., Disp: 200 mL, Rfl: 0   propranolol  (INDERAL ) 10 MG tablet, Take 1 tablet (10 mg total) by mouth 3 (three) times daily., Disp: 10 tablet, Rfl: 0   scopolamine  (TRANSDERM-SCOP) 1 MG/3DAYS, Place 1 patch (1.5 mg total) onto the skin every 3 (three) days., Disp: 4 patch, Rfl: 0   silver  sulfADIAZINE  (SILVADENE ) 1 % cream, Apply 1 Application topically 2 (two) times daily. (Patient not taking: Reported on 11/14/2023), Disp: 50 g, Rfl: 0  terbinafine  (LAMISIL ) 1 % cream, Apply 1 Application topically 2 (two) times daily. (Patient not taking: Reported on 11/14/2023), Disp: 30 g, Rfl: 2   zolmitriptan  (ZOMIG -ZMT) 5 MG disintegrating tablet, Take 1 tablet (5 mg total) by mouth once as needed for up to 1 dose for migraine. (Patient not taking: Reported on 11/14/2023), Disp: 10 tablet, Rfl: 0  Observations/Objective: Patient is well-developed, well-nourished in no acute distress.  Resting comfortably  at home.  Head is normocephalic, atraumatic.  No labored breathing.  Speech is clear and coherent with logical content.  Patient is alert and oriented at baseline.    Assessment and Plan: 1. Acute bilateral thoracic back pain (Primary)  Patient presenting with acute onset back pain.  No evidence for cauda equina, spinal infection, bony injury, other significant pathology.  The patient did not have any urinary incontinence, bowel incontinence,  saddle anesthesia, fever, or weight loss that would advanced warrant imaging. Patient was advised to followup with primary provider 3-5 days if continuing to have back pain. Advised to present to ER if signs of cauda equina or spinal infection develop including loss of bowel or bladder function, peripheral numbness/weakness/tingling, significant fevers, or other concerns.     Advised Ibuprofen  for pain and inflammation. Advised patient on supportive therapies, including rest, relaxation techniques, heat application, weight loss, ergonomic therapies, and refraining from lifting heavy objects. Instructed patient on low back pain ROM exercises. Warning symptoms discussed which would prompt return.  Follow Up Instructions: I discussed the assessment and treatment plan with the patient. The patient was provided an opportunity to ask questions and all were answered. The patient agreed with the plan and demonstrated an understanding of the instructions.  A copy of instructions were sent to the patient via MyChart unless otherwise noted below.     The patient was advised to call back or seek an in-person evaluation if the symptoms worsen or if the condition fails to improve as anticipated.    Marciana Settle, PA-C

## 2024-01-06 NOTE — Patient Instructions (Signed)
 Sabrina Brennan, thank you for joining Marciana Settle, PA-C for today's virtual visit.  While this provider is not your primary care provider (PCP), if your PCP is located in our provider database this encounter information will be shared with them immediately following your visit.   A [place] MyChart account gives you access to today's visit and all your visits, tests, and labs performed at [facility] " click here if you don't have a [place] MyChart account or go to [email or web address]  Consent: (Patient) Sabrina Brennan provided verbal consent for this virtual visit at the beginning of the encounter.  Current Medications:  Current Outpatient Medications:    albuterol  (VENTOLIN  HFA) 108 (90 Base) MCG/ACT inhaler, Inhale 1-2 puffs into the lungs every 6 (six) hours as needed. (Patient not taking: Reported on 11/14/2023), Disp: 8 g, Rfl: 0   albuterol  (VENTOLIN  HFA) 108 (90 Base) MCG/ACT inhaler, Inhale 2 puffs into the lungs every 6 (six) hours as needed for wheezing or shortness of breath., Disp: 8 g, Rfl: 0   baclofen  (LIORESAL ) 10 MG tablet, Take 0.5 tablets (5 mg total) by mouth 3 (three) times daily., Disp: 15 each, Rfl: 0   benzonatate  (TESSALON ) 100 MG capsule, Take 1-2 capsules (100-200 mg total) by mouth 3 (three) times daily as needed., Disp: 30 capsule, Rfl: 0   cetirizine  (ZYRTEC ) 10 MG tablet, Take 1 tablet (10 mg total) by mouth daily., Disp: 30 tablet, Rfl: 0   cyclobenzaprine  (FLEXERIL ) 5 MG tablet, Take 1 tablet (5 mg total) by mouth at bedtime as needed. (Patient not taking: Reported on 11/14/2023), Disp: 30 tablet, Rfl: 0   docusate sodium  (COLACE) 100 MG capsule, Take 1 capsule (100 mg total) by mouth every 12 (twelve) hours. (Patient not taking: Reported on 11/14/2023), Disp: 60 capsule, Rfl: 0   escitalopram  (LEXAPRO ) 10 MG tablet, Take 1 tablet (10 mg total) by mouth daily., Disp: 60 tablet, Rfl: 0   fluticasone  (FLONASE ) 50 MCG/ACT nasal spray, Place  2 sprays into both nostrils daily., Disp: 16 g, Rfl: 0   hydrocortisone  1 % ointment, Apply 1 Application topically 2 (two) times daily. (Patient not taking: Reported on 11/14/2023), Disp: 30 g, Rfl: 0   hydrOXYzine  (ATARAX ) 10 MG tablet, Take 1 tablet (10 mg total) by mouth 3 (three) times daily as needed. May take 2 tablets ( 20mg  total) at night for sleep disturbance, Disp: 30 tablet, Rfl: 0   ibuprofen  (ADVIL ) 600 MG tablet, Take 1 tablet (600 mg total) by mouth every 6 (six) hours as needed., Disp: 30 tablet, Rfl: 0   ketoconazole  (NIZORAL ) 2 % shampoo, Apply 1 Application topically 2 (two) times a week., Disp: 120 mL, Rfl: 2   ketoconazole  (NIZORAL ) 2 % shampoo, Apply 1 Application topically 2 (two) times a week., Disp: 120 mL, Rfl: 0   loperamide  (IMODIUM ) 2 MG capsule, Take 2 capsules (4 mg total) by mouth as needed for diarrhea or loose stools. (Patient not taking: Reported on 11/14/2023), Disp: 30 capsule, Rfl: 0   naproxen  (NAPROSYN ) 375 MG tablet, Take 1 tablet (375 mg total) by mouth 2 (two) times daily with a meal. (Patient not taking: Reported on 11/14/2023), Disp: 30 tablet, Rfl: 0   ondansetron  (ZOFRAN -ODT) 4 MG disintegrating tablet, Take 1 tablet (4 mg total) by mouth every 8 (eight) hours as needed for nausea or vomiting. (Patient not taking: Reported on 11/14/2023), Disp: 20 tablet, Rfl: 0   predniSONE  (DELTASONE ) 20 MG tablet, Take 2 tablets daily with breakfast.,  Disp: 10 tablet, Rfl: 0   promethazine -dextromethorphan  (PROMETHAZINE -DM) 6.25-15 MG/5ML syrup, Take 5 mLs by mouth 3 (three) times daily as needed for cough., Disp: 200 mL, Rfl: 0   propranolol  (INDERAL ) 10 MG tablet, Take 1 tablet (10 mg total) by mouth 3 (three) times daily., Disp: 10 tablet, Rfl: 0   scopolamine  (TRANSDERM-SCOP) 1 MG/3DAYS, Place 1 patch (1.5 mg total) onto the skin every 3 (three) days., Disp: 4 patch, Rfl: 0   silver  sulfADIAZINE  (SILVADENE ) 1 % cream, Apply 1 Application topically 2 (two) times daily.  (Patient not taking: Reported on 11/14/2023), Disp: 50 g, Rfl: 0   terbinafine  (LAMISIL ) 1 % cream, Apply 1 Application topically 2 (two) times daily. (Patient not taking: Reported on 11/14/2023), Disp: 30 g, Rfl: 2   zolmitriptan  (ZOMIG -ZMT) 5 MG disintegrating tablet, Take 1 tablet (5 mg total) by mouth once as needed for up to 1 dose for migraine. (Patient not taking: Reported on 11/14/2023), Disp: 10 tablet, Rfl: 0   Medications ordered in this encounter:  No orders of the defined types were placed in this encounter.    *If you need refills on other medications prior to your next appointment, please contact your pharmacy*  Follow-Up: Call back or seek an in-person evaluation if the symptoms worsen or if the condition fails to improve as anticipated.  [facility] [phone]  Other Instructions Please report to the nearest Emergency room with any worsening symptoms. Follow up with primary care provider (PCP) in 2 -3 days.    If you have been instructed to have an in-person evaluation today at a local Urgent Care facility, please use the link below. It will take you to a list of all of our available [place] [facility], including address, phone number and hours of operation. Please do not delay care.  [facility]  If you or a family member do not have a primary care provider, use the link below to schedule a visit and establish care. When you choose a [facility] primary care physician or advanced practice provider, you gain a long-term partner in health. Find a Primary Care Provider  Learn more about [facility]'s in-office and virtual care options: [place] - Get Care Now

## 2024-01-10 ENCOUNTER — Encounter: Payer: Self-pay | Admitting: *Deleted

## 2024-01-19 ENCOUNTER — Ambulatory Visit (HOSPITAL_COMMUNITY): Admitting: Licensed Clinical Social Worker

## 2024-01-19 DIAGNOSIS — F4323 Adjustment disorder with mixed anxiety and depressed mood: Secondary | ICD-10-CM | POA: Diagnosis not present

## 2024-01-19 NOTE — Progress Notes (Unsigned)
 THERAPIST PROGRESS NOTE   Session Date: 01/19/2024  Session Time: 1605 - 1708  Virtual Visit via Video Note  I connected with Sabrina Brennan on 01/19/24 at  4:00 PM EDT by a video enabled telemedicine application and verified that I am speaking with the correct person using two identifiers.  Location: Patient: Home Provider: [facility]   I discussed the limitations of evaluation and management by telemedicine and the availability of in person appointments. The patient expressed understanding and agreed to proceed.  The patient was advised to call back or seek an in-person evaluation if the symptoms worsen or if the condition fails to improve as anticipated.  I provided 63 minutes of non-face-to-face time during this encounter.  Participation Level: Active  Behavioral Response: CasualAlertAnxious and Depressed  Type of Therapy: Individual Therapy  Treatment Goals addressed:  - Sabrina Brennan will reduce the amount of anger-related incidents/outbursts by 50% as evidenced by self-report (Anger Management) - Sabrina Brennan will identify situations, thoughts, and feelings that trigger internal anger, and/or angry/aggressive actions as evidenced by self-report (Anger Management) - Reduce frequency, intensity, and duration of depression symptoms so that daily functioning is improved (OP Depression) - Increase coping skills to manage depression and improve ability to perform daily activities (OP Depression) - Try to improve management of moods, and how I project them (OP Depression)  Progress Towards Goals: Progressing  Interventions: CBT, Motivational Interviewing, Solution Focused, and Supportive  Summary: Sabrina Brennan is a 33 y.o. female with past psych history of Adjustment disorder with mixed anxiety and depressed mood, presenting for follow-up therapy session in efforts to improve management of presenting depressive and anxious symptoms.  Patient actively engaged in session, presenting in  variable moods, appearing pleasant overall, with increased irritability and flat affect when processing recent events. Pt actively engaged in introductory check-in, sharing of having been sick during last visit, being unable to talk resulting in prior cancellation. Pt further shared of things ***. Actively engaged in reassessing presenting depressive and anxious sxs via PHQ-9 and GAD-9, reflecting on variances in score, further detailing of having been increasingly irritated lately, getting into conflict with father surrounding his upcoming wedding to someone pt is unfamiliar with. Further processed hx of relationship ***       providing reflections of recent events and interactions between she and siblings, proving to be increasingly conflictual, leading to being argumentative and aggressive with siblings, and then blocking them. Pt engaged in processing purpose of engaging in such behaviors and exploring continued impact on relationships. Pt expressed trying to work on challenges, by trying to not be so angry, talking to grandparents and finding self noticing improved moods. Reports recent conflict with boyfriend also surrounding lack of communication, leading to being passive aggressive towards him. Actively engaged in reassessing presenting depressive and anxious sxs via PHQ-9 and GAD-7, further exploring variances in scores, and developing of individualized goals to be included in tx plan towards management of presenting depressive and anxious sxs.  Patient responded well to interventions. Patient continues to meet criteria for Adjustment disorder with mixed anxiety and depressed mood. Patient will continue to benefit from engagement in outpatient therapy due to being the least restrictive service to meet presenting needs.      01/19/2024    4:10 PM 12/19/2023    4:07 PM 12/05/2023    8:11 AM 05/28/2019    9:31 AM  GAD 7 : Generalized Anxiety Score  Nervous, Anxious, on Edge 0 0 1 0  Control/stop  worrying 2  1 1 0  Worry too much - different things 2 0 1 0  Trouble relaxing 2 1 1  0  Restless 0 1 0 0  Easily annoyed or irritable 3 3 1  0  Afraid - awful might happen 1 0 0 0  Total GAD 7 Score 10 6 5  0  Anxiety Difficulty Somewhat difficult Very difficult Somewhat difficult       01/19/2024    4:13 PM 12/19/2023    4:29 PM 12/05/2023    8:13 AM 11/14/2023    9:04 AM 09/09/2023    3:53 PM  Depression screen PHQ 2/9  Decreased Interest 1 1 1 1  0  Down, Depressed, Hopeless 0 1 2 1  0  PHQ - 2 Score 1 2 3 2  0  Altered sleeping 2 0 2 2 0  Tired, decreased energy 1 2 1 2  0  Change in appetite 2 2 3 3  0  Feeling bad or failure about yourself  0 0 0 0 0  Trouble concentrating 2 1 2 3  0  Moving slowly or fidgety/restless 0 0 0 1 0  Suicidal thoughts 0 0 0 0 0  PHQ-9 Score 8 7 11 13  0  Difficult doing work/chores Very difficult Somewhat difficult Somewhat difficult Very difficult    Flowsheet Row UC from 11/21/2023 in [facility] at [employer] [facility]) UC from 07/14/2023 in [facility] [employer]) UC from 07/04/2023 in [facility])  C-SSRS RISK CATEGORY No Risk No Risk No Risk     Suicidal/Homicidal: Nowithout intent/plan  Therapist Response:    Clinician utilized CBT, MI, Solution Focused, and supportive reflection interventions to support patient in efforts to address presenting sxs and challenges surrounding presenting stressors.  Clinician actively greeted pt upon presenting for today's virtual visit, engaging pt in introductory check-in, assessing presenting moods and affect, and further prompting patient's recounts of daily and recent events, and factors contributing to presenting moods. Actively listened to patient's reflections of recent events, exploring recent challenges and stressors as well as pt's individual efforts at improving management of symptoms, utilizing open-ended  questions to further elicit patient's thoughts and feelings surrounding attempts and observed improvements. Actively engaged pt in re-administering of PHQ-9 and GAD-7, further involving in processing of scores, noted trends, and exploration of variances in reported sxs. Utilized socratic questioning in engaging pt in exploration of areas for continued work in Counsellor of individualized tx goals to be contained within tx plan.  Clinician reassessed severity of presenting sxs, and presence of any safety concerns. Clinician provided support and empathy to patient during session.  Plan: Return again in 2 weeks.  Diagnosis:  Encounter Diagnosis  Name Primary?   Adjustment disorder with mixed anxiety and depressed mood Yes     Collaboration of Care: Psychiatrist AEB provider documentation available in EHR.  Patient/Guardian was advised Release of Information must be obtained prior to any record release in order to collaborate their care with an outside provider. Patient/Guardian was advised if they have not already done so to contact the registration department to sign all necessary forms in order for us  to release information regarding their care.   Consent: Patient/Guardian gives verbal consent for treatment and assignment of benefits for services provided during this visit. Patient/Guardian expressed understanding and agreed to proceed.   Patsi Boots, MSW, LCSW 01/19/2024,  4:47 PM

## 2024-01-23 ENCOUNTER — Ambulatory Visit (HOSPITAL_COMMUNITY): Admitting: Licensed Clinical Social Worker

## 2024-01-30 ENCOUNTER — Ambulatory Visit (INDEPENDENT_AMBULATORY_CARE_PROVIDER_SITE_OTHER)

## 2024-01-30 DIAGNOSIS — Z30019 Encounter for initial prescription of contraceptives, unspecified: Secondary | ICD-10-CM | POA: Diagnosis not present

## 2024-01-30 MED ORDER — MEDROXYPROGESTERONE ACETATE 150 MG/ML IM SUSP
150.0000 mg | Freq: Once | INTRAMUSCULAR | Status: AC
  Administered 2024-01-30: 150 mg via INTRAMUSCULAR

## 2024-01-30 NOTE — Progress Notes (Signed)
 Patient here today for Depo Provera  injection and is within her dates.     Last contraceptive appt was 08/22/2023.   Depo given in [facility]OQ today.  Site unremarkable & patient tolerated injection.     Next injection due 04/16/2024-04/30/2024.    Reminder card given.

## 2024-01-31 ENCOUNTER — Ambulatory Visit (INDEPENDENT_AMBULATORY_CARE_PROVIDER_SITE_OTHER): Admitting: Licensed Clinical Social Worker

## 2024-01-31 DIAGNOSIS — F4323 Adjustment disorder with mixed anxiety and depressed mood: Secondary | ICD-10-CM

## 2024-01-31 NOTE — Progress Notes (Signed)
 THERAPIST PROGRESS NOTE   Session Date: 01/31/2024  Session Time: 1307 - 1358  Virtual Visit via Video Note  I connected with Sabrina Brennan on 01/31/24 at  1:00 PM EDT by a video enabled telemedicine application and verified that I am speaking with the correct person using two identifiers.  Location: Patient: Home Provider: Home Office   I discussed the limitations of evaluation and management by telemedicine and the availability of in person appointments. The patient expressed understanding and agreed to proceed.  The patient was advised to call back or seek an in-person evaluation if the symptoms worsen or if the condition fails to improve as anticipated.  I provided 51 minutes of non-face-to-face time during this encounter.  Participation Level: Active  Behavioral Response: CasualAlertEuthymic  Type of Therapy: Individual Therapy  Treatment Goals addressed:  - Matsue will reduce the amount of anger-related incidents/outbursts by 50% as evidenced by self-report (Anger Management) - [name] will identify situations, thoughts, and feelings that trigger internal anger, and/or angry/aggressive actions as evidenced by self-report (Anger Management) - Reduce frequency, intensity, and duration of depression symptoms so that daily functioning is improved (OP Depression) - Increase coping skills to manage depression and improve ability to perform daily activities (OP Depression) - Try to improve management of moods, and how I project them (OP Depression)  Progress Towards Goals: Progressing  Interventions: CBT, Motivational Interviewing, Solution Focused, and Supportive  Summary: Sabrina Brennan is a 33 y.o. female with past psych history of Adjustment disorder with mixed anxiety and depressed mood, presenting for follow-up therapy session in efforts to improve management of presenting depressive and anxious symptoms.  Patient actively engaged in session, presenting in pleasant moods, and  congruent affect when processing recent events. 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 Engaged in reassessing presenting depressive and anxious sxs via PHQ-9 and GAD-7, noting of reduction in both depressive and anxious sxs, attributing to having started taking medication consistently, however finding s/e's of being tired. Pt further shared of having difficulties sleeping last night due to recent breakup, sharing of factors contributing to breakup, acknowledging of having been mad for approx. 3 days, and  coming to terms with needs to focus on own needs and challenges within relationship over the past year. Further processed experienced challenges over the past 2 years of relationship, areas of difficulties in connection, and factors contributing to separation. Briefly processed support gained from beginning journaling, and continued exploration of practice.  Patient responded well to interventions. Patient continues to meet criteria for Adjustment disorder with mixed anxiety and depressed mood. Patient will continue to benefit from engagement in outpatient therapy due to being the least restrictive service to meet presenting needs.      01/31/2024    1:10 PM 01/19/2024    4:10 PM 12/19/2023    4:07 PM 12/05/2023    8:11 AM  GAD 7 : Generalized Anxiety Score  Nervous, Anxious, on Edge 1 0 0 1  Control/stop worrying 0 2 1 1   Worry too much - different things 0 2 0 1  Trouble relaxing 1 2 1 1   Restless 1 0 1 0  Easily annoyed or irritable 2 3 3 1   Afraid - awful might happen 0 1 0 0  Total GAD 7 Score 5 10 6 5   Anxiety Difficulty Somewhat difficult Somewhat difficult Very difficult Somewhat difficult      01/31/2024  1:12 PM 01/19/2024    4:13 PM 12/19/2023    4:29 PM 12/05/2023    8:13 AM 11/14/2023    9:04 AM  Depression screen PHQ 2/9  Decreased Interest 1 1 1 1 1   Down,  Depressed, Hopeless 1 0 1 2 1   PHQ - 2 Score 2 1 2 3 2   Altered sleeping 0 2 0 2 2  Tired, decreased energy 0 1 2 1 2   Change in appetite 2 2 2 3 3   Feeling bad or failure about yourself  0 0 0 0 0  Trouble concentrating 1 2 1 2 3   Moving slowly or fidgety/restless 0 0 0 0 1  Suicidal thoughts 0 0 0 0 0  PHQ-9 Score 5 8 7 11 13   Difficult doing work/chores Somewhat difficult Very difficult Somewhat difficult Somewhat difficult Very difficult   Flowsheet Row UC from [facility]21/2023 in [facility] [employer] [facility] UC from 07/14/2023 in [facility] [employer] [facility]) UC from 07/04/2023 in [facility])  C-SSRS RISK CATEGORY No Risk No Risk No Risk     Suicidal/Homicidal: Nowithout intent/plan  Therapist Response: Clinician utilized CBT, MI, Solution Focused, and supportive reflection interventions to support patient in efforts to address presenting sxs and challenges surrounding presenting stressors.  Clinician actively greeted pt upon joining today's visit, engaging pt in introductory check-in, assessing presenting moods and affect, prompting pt's reflection on daily events and factors contributing to presentation. Actively listened to pt's reflections of having worked this morning and having break between jobs until this evening. Actively engaged pt in reassessing observed depressive and anxious sxs via PHQ-9 and GAD-7, further eliciting pt's thoughts and perspectives in relation to reduction in scores and sxs. Actively listened to pt's recounts of events of the past week, providing support and validation of expressed thoughts and feelings surrounding recent break up and difficulties navigating feelings. Utilized open ended questions to further support pt in prompting exploration of thoughts, feelings, and perspectives surrounding challenges observed within relationship, and individual efforts at navigating  such. Utilizing socratic questions to further prompt critical processing of thoughts and feelings, exploring understanding of cause vs. Effect relationships, and challenging negative, irrational thoughts, to support pt in determining more realistic, rational thoughts.  Clinician reassessed severity of presenting sxs, and presence of any safety concerns. Clinician provided support and empathy to patient during session.  Homework: Continue increasing frequency of journaling in efforts to support in reflection of thoughts, feelings, and moods throughout the day, and exploration of noted trends.   Plan: Return again in 2 weeks.  Diagnosis:  Encounter Diagnosis  Name Primary?   Adjustment disorder with mixed anxiety and depressed mood Yes     Collaboration of Care: Psychiatrist AEB provider documentation available in EHR.  Patient/Guardian was advised Release of Information must be obtained prior to any record release in order to collaborate their care with an outside provider. Patient/Guardian was advised if they have not already done so to contact the registration department to sign all necessary forms in order for us  to release information regarding their care.   Consent: Patient/Guardian gives verbal consent for treatment and assignment of benefits for services provided during this visit. Patient/Guardian expressed understanding and agreed to proceed.   [place] JONETTA Maris, MSW, LCSW 01/31/2024,  1:14 PM

## 2024-02-07 ENCOUNTER — Ambulatory Visit (INDEPENDENT_AMBULATORY_CARE_PROVIDER_SITE_OTHER): Admitting: Licensed Clinical Social Worker

## 2024-02-07 DIAGNOSIS — F4323 Adjustment disorder with mixed anxiety and depressed mood: Secondary | ICD-10-CM
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
## 2024-02-08 ENCOUNTER — Telehealth: Admitting: Physician Assistant

## 2024-02-08 ENCOUNTER — Emergency Department (HOSPITAL_COMMUNITY)

## 2024-02-08 ENCOUNTER — Other Ambulatory Visit: Payer: Self-pay

## 2024-02-08 ENCOUNTER — Encounter (HOSPITAL_COMMUNITY): Payer: Self-pay

## 2024-02-08 ENCOUNTER — Emergency Department (HOSPITAL_COMMUNITY)
Admission: EM | Admit: 2024-02-08 | Discharge: 2024-02-08 | Disposition: A | Discharge status: Home / Self Care | Attending: Emergency Medicine | Admitting: Emergency Medicine

## 2024-02-08 DIAGNOSIS — S93401A Sprain of unspecified ligament of right ankle, initial encounter: Secondary | ICD-10-CM | POA: Insufficient documentation

## 2024-02-08 DIAGNOSIS — X500XXA Overexertion from strenuous movement or load, initial encounter: Secondary | ICD-10-CM | POA: Diagnosis not present

## 2024-02-08 DIAGNOSIS — S99911A Unspecified injury of right ankle, initial encounter: Secondary | ICD-10-CM | POA: Diagnosis present

## 2024-02-08 MED ORDER — HYDROCODONE-ACETAMINOPHEN 5-325 MG PO TABS
1.0000 | ORAL_TABLET | Freq: Once | ORAL | Status: AC
  Administered 2024-02-08: 1 via ORAL
  Filled 2024-02-08: qty 1

## 2024-02-08 MED ORDER — IBUPROFEN 200 MG PO TABS
400.0000 mg | ORAL_TABLET | Freq: Once | ORAL | Status: AC | PRN
  Administered 2024-02-08: 400 mg via ORAL
  Filled 2024-02-08: qty 2

## 2024-02-08 MED ORDER — HYDROCODONE-ACETAMINOPHEN 5-325 MG PO TABS: 1.0000 | ORAL_TABLET | ORAL | 0 refills | Status: AC | PRN

## 2024-02-08 MED ORDER — NAPROXEN 500 MG PO TABS
500.0000 mg | ORAL_TABLET | Freq: Two times a day (BID) | ORAL | 0 refills | Status: DC
Start: 2024-02-08 — End: 2024-03-10

## 2024-02-08 NOTE — ED Triage Notes (Signed)
 Pt rolled her ankle on Saturday going down steps. Now the right ankle is painful to walk on and is swollen some.

## 2024-02-08 NOTE — ED Provider Notes (Signed)
 [facility] AT [facility] Provider Note   CSN: [id number] Arrival date & time: 02/08/24  9955     Patient presents with: Foot Pain   Sabrina Brennan is a 33 y.o. female.  Patient with noncontributory past medical history reports to emergency room with complaint of inversion type ankle sprain approximately 4 days ago.  Patient reports that she was walking on the steps and twisted her ankle.  Notes right lateral ankle and foot pain.  Able to ambulate but painful when weightbearing.  Has noted some swelling.  Has been trying an ankle brace and over-the-counter medications without significant improvement.    Foot Pain       Prior to Admission medications   Medication Sig Start Date End Date Taking? Authorizing Provider  albuterol  (VENTOLIN  HFA) 108 (90 Base) MCG/ACT inhaler Inhale 1-2 puffs into the lungs every 6 (six) hours as needed. Patient not taking: Reported on 11/14/2023 07/04/23   Vivienne Delon HERO, PA-C  albuterol  (VENTOLIN  HFA) 108 (90 Base) MCG/ACT inhaler Inhale 2 puffs into the lungs every 6 (six) hours as needed for wheezing or shortness of breath. 11/25/23   Blair, Diane W, FNP  benzonatate  (TESSALON ) 100 MG capsule Take 1-2 capsules (100-200 mg total) by mouth 3 (three) times daily as needed. 07/04/23   Vivienne Delon HERO, PA-C  cetirizine  (ZYRTEC ) 10 MG tablet Take 1 tablet (10 mg total) by mouth daily. 05/03/23   Vivienne Delon HERO, PA-C  cyclobenzaprine  (FLEXERIL ) 5 MG tablet Take 1 tablet (5 mg total) by mouth at bedtime as needed. Patient not taking: Reported on 11/14/2023 07/14/23   Christopher Savannah, PA-C  docusate sodium  (COLACE) 100 MG capsule Take 1 capsule (100 mg total) by mouth every 12 (twelve) hours. Patient not taking: Reported on 11/14/2023 01/17/23   Christopher Savannah, PA-C  escitalopram  (LEXAPRO ) 10 MG tablet Take 1 tablet (10 mg total) by mouth daily. 11/28/23   Ezzard Staci SAILOR, NP  fluticasone  (FLONASE ) 50 MCG/ACT nasal spray Place 2  sprays into both nostrils daily. 05/03/23   Vivienne Delon HERO, PA-C  hydrocortisone  1 % ointment Apply 1 Application topically 2 (two) times daily. Patient not taking: Reported on 11/14/2023 09/24/22   Dameron, Marisa, DO  hydrOXYzine  (ATARAX ) 10 MG tablet Take 1 tablet (10 mg total) by mouth 3 (three) times daily as needed. May take 2 tablets ( 20mg  total) at night for sleep disturbance 11/14/23   Ezzard Staci SAILOR, NP  ibuprofen  (ADVIL ) 600 MG tablet Take 1 tablet (600 mg total) by mouth every 6 (six) hours as needed. 12/30/23   Blair, Diane W, FNP  ketoconazole  (NIZORAL ) 2 % shampoo Apply 1 Application topically 2 (two) times a week. 10/21/22   Kennyth Domino, FNP  ketoconazole  (NIZORAL ) 2 % shampoo Apply 1 Application topically 2 (two) times a week. 08/22/23   Dameron, Marisa, DO  loperamide  (IMODIUM ) 2 MG capsule Take 2 capsules (4 mg total) by mouth as needed for diarrhea or loose stools. Patient not taking: Reported on 11/14/2023 09/24/22   Dameron, Marisa, DO  naproxen  (NAPROSYN ) 375 MG tablet Take 1 tablet (375 mg total) by mouth 2 (two) times daily with a meal. Patient not taking: Reported on 11/14/2023 01/17/23   Christopher Savannah, PA-C  ondansetron  (ZOFRAN -ODT) 4 MG disintegrating tablet Take 1 tablet (4 mg total) by mouth every 8 (eight) hours as needed for nausea or vomiting. Patient not taking: Reported on 11/14/2023 07/20/22   Gladis Elsie BROCKS, PA-C  predniSONE  (DELTASONE ) 20 MG  tablet Take 2 tablets daily with breakfast. 11/21/23   Christopher Savannah, PA-C  promethazine -dextromethorphan  (PROMETHAZINE -DM) 6.25-15 MG/5ML syrup Take 5 mLs by mouth 3 (three) times daily as needed for cough. 07/07/23   LampteyAleene KIDD, MD  propranolol  (INDERAL ) 10 MG tablet Take 1 tablet (10 mg total) by mouth 3 (three) times daily. 11/28/23   Ezzard Staci SAILOR, NP  scopolamine  (TRANSDERM-SCOP) 1 MG/3DAYS Place 1 patch (1.5 mg total) onto the skin every 3 (three) days. 08/19/22   Dameron, Marisa, DO  silver  sulfADIAZINE  (SILVADENE )  1 % cream Apply 1 Application topically 2 (two) times daily. Patient not taking: Reported on 11/14/2023 07/29/23   Vivienne Delon HERO, PA-C  terbinafine  (LAMISIL ) 1 % cream Apply 1 Application topically 2 (two) times daily. Patient not taking: Reported on 11/14/2023 10/21/22   Kennyth Domino, FNP  zolmitriptan  (ZOMIG -ZMT) 5 MG disintegrating tablet Take 1 tablet (5 mg total) by mouth once as needed for up to 1 dose for migraine. Patient not taking: Reported on 11/14/2023 08/12/22   Moishe Chiquita HERO, NP    Allergies: Patient has no known allergies.    Review of Systems  Musculoskeletal:  Positive for arthralgias.    Updated Vital Signs BP (!) 135/90   Pulse 84   Temp 98 F (36.7 C)   Resp 17   Ht 4' 11 (1.499 m)   Wt 83 kg   SpO2 99%   BMI 36.96 kg/m   Physical Exam Vitals and nursing note reviewed.  Constitutional:      General: She is not in acute distress.    Appearance: She is not toxic-appearing.  HENT:     Head: Normocephalic and atraumatic.  Eyes:     General: No scleral icterus.    Conjunctiva/sclera: Conjunctivae normal.  Cardiovascular:     Rate and Rhythm: Normal rate and regular rhythm.     Pulses: Normal pulses.     Heart sounds: Normal heart sounds.  Pulmonary:     Effort: Pulmonary effort is normal. No respiratory distress.     Breath sounds: Normal breath sounds.  Abdominal:     General: Abdomen is flat. Bowel sounds are normal.     Palpations: Abdomen is soft.     Tenderness: There is no abdominal tenderness.  Musculoskeletal:     Right lower leg: Edema present.     Left lower leg: No edema.     Comments: Tenderness to palpation over lateral malleolus and anterior aspect of foot.  Strong dorsal pedal pulse.  Good cap refill.  Normal sensation.  Achilles tendon intact.  No calf tenderness or swelling.  Skin:    General: Skin is warm and dry.     Findings: No lesion.  Neurological:     General: No focal deficit present.     Mental Status: She is alert  and oriented to person, place, and time. Mental status is at baseline.     (all labs ordered are listed, but only abnormal results are displayed) Labs Reviewed - No data to display  EKG: None  Radiology: DG Foot Complete Right Result Date: 02/08/2024 CLINICAL DATA:  Trip and fall injury with lateral foot and ankle pain and swelling. Twisting injury. EXAM: RIGHT FOOT COMPLETE - 3+ VIEW; PORTABLE RIGHT ANKLE - 2 VIEW COMPARISON:  None Available. FINDINGS: Three views of the right foot and three views of the right ankle are obtained. Right foot and right ankle appear intact. No evidence of acute fracture or subluxation. No focal bone lesion  or bone destruction. Bone cortex and trabecular architecture appear intact. No radiopaque soft tissue foreign bodies. IMPRESSION: No acute fracture or dislocation demonstrated in the right foot or ankle. Electronically Signed   By: Elsie Gravely M.D.   On: 02/08/2024 01:39   DG Ankle Right Port Result Date: 02/08/2024 CLINICAL DATA:  Trip and fall injury with lateral foot and ankle pain and swelling. Twisting injury. EXAM: RIGHT FOOT COMPLETE - 3+ VIEW; PORTABLE RIGHT ANKLE - 2 VIEW COMPARISON:  None Available. FINDINGS: Three views of the right foot and three views of the right ankle are obtained. Right foot and right ankle appear intact. No evidence of acute fracture or subluxation. No focal bone lesion or bone destruction. Bone cortex and trabecular architecture appear intact. No radiopaque soft tissue foreign bodies. IMPRESSION: No acute fracture or dislocation demonstrated in the right foot or ankle. Electronically Signed   By: Elsie Gravely M.D.   On: 02/08/2024 01:39     Procedures   Medications Ordered in the ED  ibuprofen  (ADVIL ) tablet 400 mg (has no administration in time range)  HYDROcodone -acetaminophen  (NORCO/VICODIN) 5-325 MG per tablet 1 tablet (has no administration in time range)                                    Medical Decision  Making Amount and/or Complexity of Data Reviewed Radiology: ordered.  Risk OTC drugs. Prescription drug management.   This patient presents to the ED for concern of ankle pain, this involves an extensive number of treatment options, and is a complaint that carries with it a high risk of complications and morbidity.  The differential diagnosis includes sprain, septic joint, gout, DVT   Imaging Studies ordered:  I ordered imaging studies including right of right foot and ankle I independently visualized and interpreted imaging which showed no acute findings I agree with the radiologist interpretation    Problem List / ED Course / Critical interventions / Medication management  Presents to emergency room with ankle pain.  She had inversion type injury.  She has good cap refill.  Neurovascularly intact.  Ambulatory with steady gait.  No acute fracture on x-ray.  Exam seems consistent with ankle sprain.  Pain is controlled.  She was given ankle brace.  Offered crutches to use as needed however patient reports she does not need crutches and is doing okay walking around.  Hemodynamically stable and well-appearing.  No other complaints. I ordered medication including Norco for pain control Reevaluation of the patient after these medicines showed that the patient improved I have reviewed the patients home medicines and have made adjustments as needed   Plan F/u w/ PCP in 2-3d to ensure resolution of sx.  Patient was given return precautions. Patient stable for discharge at this time.  Patient educated on sx/dx and verbalized understanding of plan. Return to ER w/ new or worsening sx.       Final diagnoses:  Sprain of right ankle, unspecified ligament, initial encounter    ED Discharge Orders          Ordered    naproxen  (NAPROSYN ) 500 MG tablet  2 times daily        02/08/24 0338    HYDROcodone -acetaminophen  (NORCO/VICODIN) 5-325 MG tablet  Every 4 hours PRN        02/08/24 0338                Hinley Brimage,  Warren SAILOR, PA-C 02/08/24 0340    Griselda Norris, MD 02/08/24 [phone]

## 2024-02-08 NOTE — Discharge Instructions (Addendum)
 Please take naproxen  twice daily as prescribed.  You can also take 1000 mg of Tylenol  every 8 hours.  Use ice, Ace bandage and elevation.  Put weight on the foot as tolerated.  For breakthrough pain use Norco.  When you are on your feet walking around I would recommend wearing the ankle brace.  If symptoms are not starting to improve please follow-up with orthopedics for recheck.

## 2024-02-08 NOTE — Patient Instructions (Signed)
 Sabrina Brennan, thank you for joining Delon CHRISTELLA Dickinson, PA-C for today's virtual visit.  While this provider is not your primary care provider (PCP), if your PCP is located in our provider database this encounter information will be shared with them immediately following your visit.   [name] account gives you access to today's visit and all your visits, tests, and labs performed at [facility]  click here if you don't have a [facility] MyChart account or go to [email or web address]  Consent: (Patient) Sabrina Brennan provided verbal consent for this virtual visit at the beginning of the encounter.  Current Medications:  Current Outpatient Medications:    albuterol  (VENTOLIN  HFA) 108 (90 Base) MCG/ACT inhaler, Inhale 1-2 puffs into the lungs every 6 (six) hours as needed. (Patient not taking: Reported on 11/14/2023), Disp: 8 g, Rfl: 0   albuterol  (VENTOLIN  HFA) 108 (90 Base) MCG/ACT inhaler, Inhale 2 puffs into the lungs every 6 (six) hours as needed for wheezing or shortness of breath., Disp: 8 g, Rfl: 0   benzonatate  (TESSALON ) 100 MG capsule, Take 1-2 capsules (100-200 mg total) by mouth 3 (three) times daily as needed., Disp: 30 capsule, Rfl: 0   cetirizine  (ZYRTEC ) 10 MG tablet, Take 1 tablet (10 mg total) by mouth daily., Disp: 30 tablet, Rfl: 0   cyclobenzaprine  (FLEXERIL ) 5 MG tablet, Take 1 tablet (5 mg total) by mouth at bedtime as needed. (Patient not taking: Reported on 11/14/2023), Disp: 30 tablet, Rfl: 0   docusate sodium  (COLACE) 100 MG capsule, Take 1 capsule (100 mg total) by mouth every 12 (twelve) hours. (Patient not taking: Reported on 11/14/2023), Disp: 60 capsule, Rfl: 0   escitalopram  (LEXAPRO ) 10 MG tablet, Take 1 tablet (10 mg total) by mouth daily., Disp: 60 tablet, Rfl: 0   fluticasone  (FLONASE ) 50 MCG/ACT nasal spray, Place 2 sprays into both nostrils daily., Disp: 16 g, Rfl: 0   HYDROcodone -acetaminophen  (NORCO/VICODIN) 5-325 MG  tablet, Take 1 tablet by mouth every 4 (four) hours as needed for severe pain (pain score 7-10)., Disp: 5 tablet, Rfl: 0   hydrocortisone  1 % ointment, Apply 1 Application topically 2 (two) times daily. (Patient not taking: Reported on 11/14/2023), Disp: 30 g, Rfl: 0   hydrOXYzine  (ATARAX ) 10 MG tablet, Take 1 tablet (10 mg total) by mouth 3 (three) times daily as needed. May take 2 tablets ( 20mg  total) at night for sleep disturbance, Disp: 30 tablet, Rfl: 0   ibuprofen  (ADVIL ) 600 MG tablet, Take 1 tablet (600 mg total) by mouth every 6 (six) hours as needed., Disp: 30 tablet, Rfl: 0   ketoconazole  (NIZORAL ) 2 % shampoo, Apply 1 Application topically 2 (two) times a week., Disp: 120 mL, Rfl: 2   ketoconazole  (NIZORAL ) 2 % shampoo, Apply 1 Application topically 2 (two) times a week., Disp: 120 mL, Rfl: 0   loperamide  (IMODIUM ) 2 MG capsule, Take 2 capsules (4 mg total) by mouth as needed for diarrhea or loose stools. (Patient not taking: Reported on 11/14/2023), Disp: 30 capsule, Rfl: 0   naproxen  (NAPROSYN ) 500 MG tablet, Take 1 tablet (500 mg total) by mouth 2 (two) times daily., Disp: 30 tablet, Rfl: 0   ondansetron  (ZOFRAN -ODT) 4 MG disintegrating tablet, Take 1 tablet (4 mg total) by mouth every 8 (eight) hours as needed for nausea or vomiting. (Patient not taking: Reported on 11/14/2023), Disp: 20 tablet, Rfl: 0   predniSONE  (DELTASONE ) 20 MG tablet, Take 2 tablets daily with breakfast., Disp: 10 tablet,  Rfl: 0   promethazine -dextromethorphan  (PROMETHAZINE -DM) 6.25-15 MG/5ML syrup, Take 5 mLs by mouth 3 (three) times daily as needed for cough., Disp: 200 mL, Rfl: 0   propranolol  (INDERAL ) 10 MG tablet, Take 1 tablet (10 mg total) by mouth 3 (three) times daily., Disp: 10 tablet, Rfl: 0   scopolamine  (TRANSDERM-SCOP) 1 MG/3DAYS, Place 1 patch (1.5 mg total) onto the skin every 3 (three) days., Disp: 4 patch, Rfl: 0   silver  sulfADIAZINE  (SILVADENE ) 1 % cream, Apply 1 Application topically 2 (two)  times daily. (Patient not taking: Reported on 11/14/2023), Disp: 50 g, Rfl: 0   terbinafine  (LAMISIL ) 1 % cream, Apply 1 Application topically 2 (two) times daily. (Patient not taking: Reported on 11/14/2023), Disp: 30 g, Rfl: 2   zolmitriptan  (ZOMIG -ZMT) 5 MG disintegrating tablet, Take 1 tablet (5 mg total) by mouth once as needed for up to 1 dose for migraine. (Patient not taking: Reported on 11/14/2023), Disp: 10 tablet, Rfl: 0   Medications ordered in this encounter:  No orders of the defined types were placed in this encounter.    *If you need refills on other medications prior to your next appointment, please contact your pharmacy*  Follow-Up: Call back or seek an in-person evaluation if the symptoms worsen or if the condition fails to improve as anticipated.  [facility] [phone]  Other Instructions Ankle Sprain, Phase I Rehab An ankle sprain is an injury to the tissues that connect bone to bone (ligaments) in your ankle. Ankle sprains can cause stiffness, loss of motion, and loss of strength. Ask your health care provider which exercises are safe for you. Do exercises exactly as told by your provider and adjust them as directed. It is normal to feel mild stretching, pulling, tightness, or discomfort as you do these exercises. Stop right away if you feel sudden pain or your pain gets worse. Do not begin these exercises until told by your provider. Stretching and range-of-motion exercises These exercises warm up your muscles and joints. They can improve the movement and flexibility of your lower leg and ankle. They also help to relieve pain and stiffness. Gastroc and soleus stretch This exercise is also called a calf stretch. It stretches the muscles in the back of the lower leg. These muscles are the gastrocnemius, or gastroc, and the soleus. Sit on the floor with your left / right leg extended. Loop a belt or towel around the ball of your left / right foot. The ball of  your foot is on the walking surface, right under your toes. Keep your left / right ankle and foot relaxed and keep your knee straight. Use the belt or towel to pull your foot toward you. You should feel a gentle stretch behind your calf or knee in your gastroc muscle. Hold this position for __________ seconds, then release to the starting position. Repeat the exercise with your knee bent. You can put a pillow or a rolled bath towel under your knee to support it. You should feel a stretch deep in your calf in the soleus muscle or at your Achilles tendon. Repeat __________ times. Complete this exercise __________ times a day. Ankle alphabet  Sit with your left / right leg supported at the lower leg. Do not rest your foot on anything. Make sure your foot has room to move freely. Think of your left / right foot as a paintbrush. Move your foot to trace each letter of the alphabet in the air. Keep your hip  and knee still while you trace. Make the letters as large as you can without feeling discomfort. Trace every letter from A to Z. Repeat __________ times. Complete this exercise __________ times a day. Strengthening exercises These exercises build strength and endurance in your ankle and lower leg. Endurance is the ability to use your muscles for a long time, even after they get tired. Ankle dorsiflexion  Secure a rubber exercise band or tube to an object, such as a table leg, that will stay still when the band is pulled. Secure the other end around your left / right foot. Sit on the floor facing the object, with your left / right leg extended. The band or tube should be slightly tense when your foot is relaxed. Slowly bring your foot toward you, bringing the top of your foot toward your shin (dorsiflexion), and pulling the band tighter. Hold this position for __________ seconds. Slowly return your foot to the starting position. Repeat __________ times. Complete this exercise __________ times a  day. Ankle plantar flexion  Sit on the floor with your left / right leg extended. Loop a rubber exercise tube or band around the ball of your left / right foot. The ball of your foot is on the walking surface, right under your toes. Hold the ends of the band or tube in your hands. The band or tube should be slightly tense when your foot is relaxed. Slowly point your foot and toes downward to tilt the top of your foot away from your shin (plantar flexion). Hold this position for __________ seconds. Slowly return your foot to the starting position. Repeat __________ times. Complete this exercise __________ times a day. Ankle eversion  Sit on the floor with your legs straight out in front of you. Loop a rubber exercise band or tube around the ball of your left / right foot. The ball of your foot is on the walking surface, right under your toes. Hold the ends of the band in your hands or secure the band to a stable object. The band or tube should be slightly tense when your foot is relaxed. Slowly push your foot outward, away from your other leg (eversion). Hold this position for __________ seconds. Slowly return your foot to the starting position. Repeat __________ times. Complete this exercise __________ times a day. This information is not intended to replace advice given to you by your health care provider. Make sure you discuss any questions you have with your health care provider. Document Revised: 05/12/2022 Document Reviewed: 05/12/2022 Elsevier Patient Education  2024 Elsevier Inc.   If you have been instructed to have an in-person evaluation today at a local Urgent Care facility, please use the link below. It will take you to a list of all of our available [facility], including address, phone number and hours of operation. Please do not delay care.  [facility]  If you or a family member do not have a primary care provider, use the link below to schedule  a visit and establish care. When you choose a [facility] primary care physician or advanced practice provider, you gain a long-term partner in health. Find a Primary Care Provider  Learn more about [facility]'s in-office and virtual care options:  - Get Care Now

## 2024-02-08 NOTE — Progress Notes (Signed)
 Virtual Visit Consent   Sabrina Brennan, you are scheduled for a virtual visit with a [facility] provider today. Just as with appointments in the office, your consent must be obtained to participate. Your consent will be active for this visit and any virtual visit you may have with one of our providers in the next 365 days. If you have a MyChart account, a copy of this consent can be sent to you electronically.  As this is a virtual visit, video technology does not allow for your provider to perform a traditional examination. This may limit your provider's ability to fully assess your condition. If your provider identifies any concerns that need to be evaluated in person or the need to arrange testing (such as labs, EKG, etc.), we will make arrangements to do so. Although advances in technology are sophisticated, we cannot ensure that it will always work on either your end or our end. If the connection with a video visit is poor, the visit may have to be switched to a telephone visit. With either a video or telephone visit, we are not always able to ensure that we have a secure connection.  By engaging in this virtual visit, you consent to the provision of healthcare and authorize for your insurance to be billed (if applicable) for the services provided during this visit. Depending on your insurance coverage, you may receive a charge related to this service.  I need to obtain your verbal consent now. Are you willing to proceed with your visit today? Sabrina Brennan has provided verbal consent on 02/08/2024 for a virtual visit (video or telephone). Sabrina CHRISTELLA Dickinson, PA-C  Date: 02/08/2024 4:10 PM   Virtual Visit via Video Note   I, Sabrina Brennan, [name]  ([id number], 06/01/1991) on 02/08/24 at  4:00 PM EDT by a video-enabled telemedicine application and verified that I am speaking with the correct person using two identifiers.  Location: Patient: Virtual Visit Location  Patient: Home Provider: Virtual Visit Location Provider: Home Office   I discussed the limitations of evaluation and management by telemedicine and the availability of in person appointments. The patient expressed understanding and agreed to proceed.    History of Present Illness: Sabrina Brennan is a 33 y.o. who identifies as a female who was assigned female at birth, and is being seen today for follow up to ER visit she had earlier today for a right ankle sprain that occurred 4 days ago. She was given a brace and pain medication. However, she is in need of a work note now as well. She was going to try to work, but the swelling and pain is too much to allow her to work at this time.   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 (Patient not taking: Reported on 11/14/2023), Disp: 8 g, Rfl: 0   albuterol  (  VENTOLIN  HFA) 108 (90 Base) MCG/ACT inhaler, Inhale 2 puffs into the lungs every 6 (six) hours as needed for wheezing or shortness of breath., Disp: 8 g, Rfl: 0   benzonatate  (TESSALON ) 100 MG capsule, Take 1-2 capsules (100-200 mg total) by mouth 3 (three) times daily as needed., Disp: 30 capsule, Rfl: 0   cetirizine  (ZYRTEC ) 10 MG tablet, Take 1 tablet (10 mg total) by mouth daily., Disp: 30 tablet, Rfl: 0   cyclobenzaprine  (FLEXERIL ) 5 MG tablet, Take 1  tablet (5 mg total) by mouth at bedtime as needed. (Patient not taking: Reported on 11/14/2023), Disp: 30 tablet, Rfl: 0   docusate sodium  (COLACE) 100 MG capsule, Take 1 capsule (100 mg total) by mouth every 12 (twelve) hours. (Patient not taking: Reported on 11/14/2023), Disp: 60 capsule, Rfl: 0   escitalopram  (LEXAPRO ) 10 MG tablet, Take 1 tablet (10 mg total) by mouth daily., Disp: 60 tablet, Rfl: 0   fluticasone  (FLONASE ) 50 MCG/ACT nasal spray, Place 2 sprays into both nostrils daily., Disp: 16 g, Rfl: 0   HYDROcodone -acetaminophen  (NORCO/VICODIN) 5-325 MG tablet, Take 1 tablet by mouth every 4 (four) hours as needed for severe pain (pain score 7-10)., Disp: 5 tablet, Rfl: 0   hydrocortisone  1 % ointment, Apply 1 Application topically 2 (two) times daily. (Patient not taking: Reported on 11/14/2023), Disp: 30 g, Rfl: 0   hydrOXYzine  (ATARAX ) 10 MG tablet, Take 1 tablet (10 mg total) by mouth 3 (three) times daily as needed. May take 2 tablets ( 20mg  total) at night for sleep disturbance, Disp: 30 tablet, Rfl: 0   ibuprofen  (ADVIL ) 600 MG tablet, Take 1 tablet (600 mg total) by mouth every 6 (six) hours as needed., Disp: 30 tablet, Rfl: 0   ketoconazole  (NIZORAL ) 2 % shampoo, Apply 1 Application topically 2 (two) times a week., Disp: 120 mL, Rfl: 2   ketoconazole  (NIZORAL ) 2 % shampoo, Apply 1 Application topically 2 (two) times a week., Disp: 120 mL, Rfl: 0   loperamide  (IMODIUM ) 2 MG capsule, Take 2 capsules (4 mg total) by mouth as needed for diarrhea or loose stools. (Patient not taking: Reported on 11/14/2023), Disp: 30 capsule, Rfl: 0   naproxen  (NAPROSYN ) 500 MG tablet, Take 1 tablet (500 mg total) by mouth 2 (two) times daily., Disp: 30 tablet, Rfl: 0   ondansetron  (ZOFRAN -ODT) 4 MG disintegrating tablet, Take 1 tablet (4 mg total) by mouth every 8 (eight) hours as needed for nausea or vomiting. (Patient not taking: Reported on 11/14/2023), Disp: 20 tablet, Rfl: 0   predniSONE  (DELTASONE ) 20 MG  tablet, Take 2 tablets daily with breakfast., Disp: 10 tablet, Rfl: 0   promethazine -dextromethorphan  (PROMETHAZINE -DM) 6.25-15 MG/5ML syrup, Take 5 mLs by mouth 3 (three) times daily as needed for cough., Disp: 200 mL, Rfl: 0   propranolol  (INDERAL ) 10 MG tablet, Take 1 tablet (10 mg total) by mouth 3 (three) times daily., Disp: 10 tablet, Rfl: 0   scopolamine  (TRANSDERM-SCOP) 1 MG/3DAYS, Place 1 patch (1.5 mg total) onto the skin every 3 (three) days., Disp: 4 patch, Rfl: 0   silver  sulfADIAZINE  (SILVADENE ) 1 % cream, Apply 1 Application topically 2 (two) times daily. (Patient not taking: Reported on 11/14/2023), Disp: 50 g, Rfl: 0   terbinafine  (LAMISIL ) 1 % cream, Apply 1 Application topically 2 (two) times daily. (Patient not taking: Reported on 11/14/2023), Disp: 30 g, Rfl: 2   zolmitriptan  (ZOMIG -ZMT) 5 MG disintegrating tablet, Take 1 tablet (5 mg total) by mouth once as needed for up  to 1 dose for migraine. (Patient not taking: Reported on 11/14/2023), Disp: 10 tablet, Rfl: 0  Observations/Objective: Patient is well-developed, well-nourished in no acute distress.  Resting comfortably at home.  Head is normocephalic, atraumatic.  No labored breathing.  Speech is clear and coherent with logical content.  Patient is alert and oriented at baseline.    Assessment and Plan: 1. Sprain of right ankle, unspecified ligament, initial encounter (Primary)  - Work note provided - Continue previously mentioned treatment plan from the ER - Ankle exercises provided in AVS - Discussed wearing compression stockings on her upcoming flight - Discussed trying to walk, but go slow and make sure walking in a heel-to-toe fashion - Wear good foot support - Continue ankle brace - Seek in person evaluation if continues to worsen  Follow Up Instructions: I discussed the assessment and treatment plan with the patient. The patient was provided an opportunity to ask questions and all were answered. The patient  agreed with the plan and demonstrated an understanding of the instructions.  A copy of instructions were sent to the patient via MyChart unless otherwise noted below.    The patient was advised to call back or seek an in-person evaluation if the symptoms worsen or if the condition fails to improve as anticipated.    Sabrina CHRISTELLA Dickinson, PA-C

## 2024-02-21 ENCOUNTER — Ambulatory Visit (INDEPENDENT_AMBULATORY_CARE_PROVIDER_SITE_OTHER): Admitting: Licensed Clinical Social Worker

## 2024-02-21 DIAGNOSIS — F4323 Adjustment disorder with mixed anxiety and depressed mood: Secondary | ICD-10-CM
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
## 2024-03-01 ENCOUNTER — Ambulatory Visit (HOSPITAL_COMMUNITY): Admitting: Licensed Clinical Social Worker

## 2024-03-05 ENCOUNTER — Telehealth: Admitting: Physician Assistant

## 2024-03-05 DIAGNOSIS — S93401A Sprain of unspecified ligament of right ankle, initial encounter: Secondary | ICD-10-CM | POA: Diagnosis not present

## 2024-03-05 NOTE — Progress Notes (Signed)
 Virtual Visit Consent   Sabrina Brennan, you are scheduled for a virtual visit with a [facility] provider today. Just as with appointments in the office, your consent must be obtained to participate. Your consent will be active for this visit and any virtual visit you may have with one of our providers in the next 365 days. If you have a MyChart account, a copy of this consent can be sent to you electronically.  As this is a virtual visit, video technology does not allow for your provider to perform a traditional examination. This may limit your provider's ability to fully assess your condition. If your provider identifies any concerns that need to be evaluated in person or the need to arrange testing (such as labs, EKG, etc.), we will make arrangements to do so. Although advances in technology are sophisticated, we cannot ensure that it will always work on either your end or our end. If the connection with a video visit is poor, the visit may have to be switched to a telephone visit. With either a video or telephone visit, we are not always able to ensure that we have a secure connection.  By engaging in this virtual visit, you consent to the provision of healthcare and authorize for your insurance to be billed (if applicable) for the services provided during this visit. Depending on your insurance coverage, you may receive a charge related to this service.  I need to obtain your verbal consent now. Are you willing to proceed with your visit today? NEMESIS RAINWATER has provided verbal consent on 03/05/2024 for a virtual visit (video or telephone). Delon CHRISTELLA Dickinson, PA-C  Date: 03/05/2024 2:10 PM   Virtual Visit via Video Note   I, Delon CHRISTELLA Dickinson, [name]  ([id number], March 27, 1991) on 03/05/24 at  2:00 PM EDT by a video-enabled telemedicine application and verified that I am speaking with the correct person using two identifiers.  Location: Patient: Virtual Visit Location  Patient: Home Provider: Virtual Visit Location Provider: Home Office   I discussed the limitations of evaluation and management by telemedicine and the availability of in person appointments. The patient expressed understanding and agreed to proceed.    History of Present Illness: Sabrina Brennan is a 33 y.o. who identifies as a female who was assigned female at birth, and is being seen today for recurrent ankle pain. Injured the ankle about 4 weeks ago. Last seen by the Virtual Urgent Care team on 02/08/24 for the same issue. Had to travel after that visit and was not granted the wheelchair access or assistance by the airport even though it was requested. Had to walk the entirety of the airport and her ankle is still quite swollen and very sore. She is unable to bear weight fully at this time. She does have an appt with Orthopedics tomorrow.   Problems:  Patient Active Problem List   Diagnosis Date Noted   Difficulty concentrating 08/22/2023   Obesity 08/22/2023   Post-operative state 06/11/2022   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, not intractable, w stat migr 05/14/2022   Uses birth control 10/29/2021   Hemiplegic migraine without status migrainosus, not intractable 10/29/2020   History of abnormal cervical Pap smear 08/28/2020   High grade squamous intraepithelial lesion (HGSIL), grade 3 CIN, 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    Allergies: No Known Allergies Medications:  Current Outpatient Medications:    albuterol  (VENTOLIN  HFA) 108 (90 Base) MCG/ACT inhaler, Inhale 1-2 puffs into the  lungs every 6 (six) hours as needed. (Patient not taking: Reported on 11/14/2023), Disp: 8 g, Rfl: 0   albuterol  (VENTOLIN  HFA) 108 (90 Base) MCG/ACT inhaler, Inhale 2 puffs into the lungs every 6 (six) hours as needed for wheezing or shortness of breath., Disp: 8 g, Rfl: 0   benzonatate  (TESSALON ) 100 MG capsule, Take 1-2 capsules (100-200 mg total) by mouth 3 (three) times daily as needed., Disp: 30 capsule, Rfl:  0   cetirizine  (ZYRTEC ) 10 MG tablet, Take 1 tablet (10 mg total) by mouth daily., Disp: 30 tablet, Rfl: 0   cyclobenzaprine  (FLEXERIL ) 5 MG tablet, Take 1 tablet (5 mg total) by mouth at bedtime as needed. (Patient not taking: Reported on 11/14/2023), Disp: 30 tablet, Rfl: 0   docusate sodium  (COLACE) 100 MG capsule, Take 1 capsule (100 mg total) by mouth every 12 (twelve) hours. (Patient not taking: Reported on 11/14/2023), Disp: 60 capsule, Rfl: 0   escitalopram  (LEXAPRO ) 10 MG tablet, Take 1 tablet (10 mg total) by mouth daily., Disp: 60 tablet, Rfl: 0   fluticasone  (FLONASE ) 50 MCG/ACT nasal spray, Place 2 sprays into both nostrils daily., Disp: 16 g, Rfl: 0   HYDROcodone -acetaminophen  (NORCO/VICODIN) 5-325 MG tablet, Take 1 tablet by mouth every 4 (four) hours as needed for severe pain (pain score 7-10)., Disp: 5 tablet, Rfl: 0   hydrocortisone  1 % ointment, Apply 1 Application topically 2 (two) times daily. (Patient not taking: Reported on 11/14/2023), Disp: 30 g, Rfl: 0   hydrOXYzine  (ATARAX ) 10 MG tablet, Take 1 tablet (10 mg total) by mouth 3 (three) times daily as needed. May take 2 tablets ( 20mg  total) at night for sleep disturbance, Disp: 30 tablet, Rfl: 0   ibuprofen  (ADVIL ) 600 MG tablet, Take 1 tablet (600 mg total) by mouth every 6 (six) hours as needed., Disp: 30 tablet, Rfl: 0   ketoconazole  (NIZORAL ) 2 % shampoo, Apply 1 Application topically 2 (two) times a week., Disp: 120 mL, Rfl: 2   ketoconazole  (NIZORAL ) 2 % shampoo, Apply 1 Application topically 2 (two) times a week., Disp: 120 mL, Rfl: 0   loperamide  (IMODIUM ) 2 MG capsule, Take 2 capsules (4 mg total) by mouth as needed for diarrhea or loose stools. (Patient not taking: Reported on 11/14/2023), Disp: 30 capsule, Rfl: 0   naproxen  (NAPROSYN ) 500 MG tablet, Take 1 tablet (500 mg total) by mouth 2 (two) times daily., Disp: 30 tablet, Rfl: 0   ondansetron  (ZOFRAN -ODT) 4 MG disintegrating tablet, Take 1 tablet (4 mg total) by mouth  every 8 (eight) hours as needed for nausea or vomiting. (Patient not taking: Reported on 11/14/2023), Disp: 20 tablet, Rfl: 0   predniSONE  (DELTASONE ) 20 MG tablet, Take 2 tablets daily with breakfast., Disp: 10 tablet, Rfl: 0   promethazine -dextromethorphan  (PROMETHAZINE -DM) 6.25-15 MG/5ML syrup, Take 5 mLs by mouth 3 (three) times daily as needed for cough., Disp: 200 mL, Rfl: 0   propranolol  (INDERAL ) 10 MG tablet, Take 1 tablet (10 mg total) by mouth 3 (three) times daily., Disp: 10 tablet, Rfl: 0   scopolamine  (TRANSDERM-SCOP) 1 MG/3DAYS, Place 1 patch (1.5 mg total) onto the skin every 3 (three) days., Disp: 4 patch, Rfl: 0   silver  sulfADIAZINE  (SILVADENE ) 1 % cream, Apply 1 Application topically 2 (two) times daily. (Patient not taking: Reported on 11/14/2023), Disp: 50 g, Rfl: 0   terbinafine  (LAMISIL ) 1 % cream, Apply 1 Application topically 2 (two) times daily. (Patient not taking: Reported on 11/14/2023), Disp: 30 g, Rfl: 2  zolmitriptan  (ZOMIG -ZMT) 5 MG disintegrating tablet, Take 1 tablet (5 mg total) by mouth once as needed for up to 1 dose for migraine. (Patient not taking: Reported on 11/14/2023), Disp: 10 tablet, Rfl: 0  Observations/Objective: Patient is well-developed, well-nourished in no acute distress.  Resting comfortably at home.  Head is normocephalic, atraumatic.  No labored breathing.  Speech is clear and coherent with logical content.  Patient is alert and oriented at baseline.  Right lateral malleolus is swollen appearing with mild swelling in soft tissue areas surrounding. No discoloration or deformity noted    Assessment and Plan: 1. Sprain of right ankle, unspecified ligament, initial encounter (Primary)  - Occurred almost 4 weeks ago - Still with swelling and pain - Still with difficulty ambulating - Advised to use Tylenol  and Ibuprofen  alternating - Ankle exercises in AVS - Rest - Elevate the ankle - Ice - Keep scheduled appt with Orthopedics tomorrow -  Work note provided - Seek in person evaluation if worsens acutely  Follow Up Instructions: I discussed the assessment and treatment plan with the patient. The patient was provided an opportunity to ask questions and all were answered. The patient agreed with the plan and demonstrated an understanding of the instructions.  A copy of instructions were sent to the patient via MyChart unless otherwise noted below.    The patient was advised to call back or seek an in-person evaluation if the symptoms worsen or if the condition fails to improve as anticipated.    Delon CHRISTELLA Dickinson, PA-C

## 2024-03-05 NOTE — Patient Instructions (Signed)
 Sabrina Brennan, thank you for joining Delon CHRISTELLA Dickinson, PA-C for today's virtual visit.  While this provider is not your primary care provider (PCP), if your PCP is located in our provider database this encounter information will be shared with them immediately following your visit.   [name] account gives you access to today's visit and all your visits, tests, and labs performed at [facility]  click here if you don't have a [facility] MyChart account or go to [email or web address]  Consent: (Patient) Sabrina Brennan provided verbal consent for this virtual visit at the beginning of the encounter.  Current Medications:  Current Outpatient Medications:    albuterol  (VENTOLIN  HFA) 108 (90 Base) MCG/ACT inhaler, Inhale 1-2 puffs into the lungs every 6 (six) hours as needed. (Patient not taking: Reported on 11/14/2023), Disp: 8 g, Rfl: 0   albuterol  (VENTOLIN  HFA) 108 (90 Base) MCG/ACT inhaler, Inhale 2 puffs into the lungs every 6 (six) hours as needed for wheezing or shortness of breath., Disp: 8 g, Rfl: 0   benzonatate  (TESSALON ) 100 MG capsule, Take 1-2 capsules (100-200 mg total) by mouth 3 (three) times daily as needed., Disp: 30 capsule, Rfl: 0   cetirizine  (ZYRTEC ) 10 MG tablet, Take 1 tablet (10 mg total) by mouth daily., Disp: 30 tablet, Rfl: 0   cyclobenzaprine  (FLEXERIL ) 5 MG tablet, Take 1 tablet (5 mg total) by mouth at bedtime as needed. (Patient not taking: Reported on 11/14/2023), Disp: 30 tablet, Rfl: 0   docusate sodium  (COLACE) 100 MG capsule, Take 1 capsule (100 mg total) by mouth every 12 (twelve) hours. (Patient not taking: Reported on 11/14/2023), Disp: 60 capsule, Rfl: 0   escitalopram  (LEXAPRO ) 10 MG tablet, Take 1 tablet (10 mg total) by mouth daily., Disp: 60 tablet, Rfl: 0   fluticasone  (FLONASE ) 50 MCG/ACT nasal spray, Place 2 sprays into both nostrils daily., Disp: 16 g, Rfl: 0   HYDROcodone -acetaminophen  (NORCO/VICODIN) 5-325 MG  tablet, Take 1 tablet by mouth every 4 (four) hours as needed for severe pain (pain score 7-10)., Disp: 5 tablet, Rfl: 0   hydrocortisone  1 % ointment, Apply 1 Application topically 2 (two) times daily. (Patient not taking: Reported on 11/14/2023), Disp: 30 g, Rfl: 0   hydrOXYzine  (ATARAX ) 10 MG tablet, Take 1 tablet (10 mg total) by mouth 3 (three) times daily as needed. May take 2 tablets ( 20mg  total) at night for sleep disturbance, Disp: 30 tablet, Rfl: 0   ibuprofen  (ADVIL ) 600 MG tablet, Take 1 tablet (600 mg total) by mouth every 6 (six) hours as needed., Disp: 30 tablet, Rfl: 0   ketoconazole  (NIZORAL ) 2 % shampoo, Apply 1 Application topically 2 (two) times a week., Disp: 120 mL, Rfl: 2   ketoconazole  (NIZORAL ) 2 % shampoo, Apply 1 Application topically 2 (two) times a week., Disp: 120 mL, Rfl: 0   loperamide  (IMODIUM ) 2 MG capsule, Take 2 capsules (4 mg total) by mouth as needed for diarrhea or loose stools. (Patient not taking: Reported on 11/14/2023), Disp: 30 capsule, Rfl: 0   naproxen  (NAPROSYN ) 500 MG tablet, Take 1 tablet (500 mg total) by mouth 2 (two) times daily., Disp: 30 tablet, Rfl: 0   ondansetron  (ZOFRAN -ODT) 4 MG disintegrating tablet, Take 1 tablet (4 mg total) by mouth every 8 (eight) hours as needed for nausea or vomiting. (Patient not taking: Reported on 11/14/2023), Disp: 20 tablet, Rfl: 0   predniSONE  (DELTASONE ) 20 MG tablet, Take 2 tablets daily with breakfast., Disp: 10 tablet,  Rfl: 0   promethazine -dextromethorphan  (PROMETHAZINE -DM) 6.25-15 MG/5ML syrup, Take 5 mLs by mouth 3 (three) times daily as needed for cough., Disp: 200 mL, Rfl: 0   propranolol  (INDERAL ) 10 MG tablet, Take 1 tablet (10 mg total) by mouth 3 (three) times daily., Disp: 10 tablet, Rfl: 0   scopolamine  (TRANSDERM-SCOP) 1 MG/3DAYS, Place 1 patch (1.5 mg total) onto the skin every 3 (three) days., Disp: 4 patch, Rfl: 0   silver  sulfADIAZINE  (SILVADENE ) 1 % cream, Apply 1 Application topically 2 (two)  times daily. (Patient not taking: Reported on 11/14/2023), Disp: 50 g, Rfl: 0   terbinafine  (LAMISIL ) 1 % cream, Apply 1 Application topically 2 (two) times daily. (Patient not taking: Reported on 11/14/2023), Disp: 30 g, Rfl: 2   zolmitriptan  (ZOMIG -ZMT) 5 MG disintegrating tablet, Take 1 tablet (5 mg total) by mouth once as needed for up to 1 dose for migraine. (Patient not taking: Reported on 11/14/2023), Disp: 10 tablet, Rfl: 0   Medications ordered in this encounter:  No orders of the defined types were placed in this encounter.    *If you need refills on other medications prior to your next appointment, please contact your pharmacy*  Follow-Up: Call back or seek an in-person evaluation if the symptoms worsen or if the condition fails to improve as anticipated.  [facility] [phone]  Other Instructions Ankle Sprain, Phase I Rehab An ankle sprain is an injury to the tissues that connect bone to bone (ligaments) in your ankle. Ankle sprains can cause stiffness, loss of motion, and loss of strength. Ask your health care provider which exercises are safe for you. Do exercises exactly as told by your provider and adjust them as directed. It is normal to feel mild stretching, pulling, tightness, or discomfort as you do these exercises. Stop right away if you feel sudden pain or your pain gets worse. Do not begin these exercises until told by your provider. Stretching and range-of-motion exercises These exercises warm up your muscles and joints. They can improve the movement and flexibility of your lower leg and ankle. They also help to relieve pain and stiffness. Gastroc and soleus stretch This exercise is also called a calf stretch. It stretches the muscles in the back of the lower leg. These muscles are the gastrocnemius, or gastroc, and the soleus. Sit on the floor with your left / right leg extended. Loop a belt or towel around the ball of your left / right foot. The ball of  your foot is on the walking surface, right under your toes. Keep your left / right ankle and foot relaxed and keep your knee straight. Use the belt or towel to pull your foot toward you. You should feel a gentle stretch behind your calf or knee in your gastroc muscle. Hold this position for __________ seconds, then release to the starting position. Repeat the exercise with your knee bent. You can put a pillow or a rolled bath towel under your knee to support it. You should feel a stretch deep in your calf in the soleus muscle or at your Achilles tendon. Repeat __________ times. Complete this exercise __________ times a day. Ankle alphabet  Sit with your left / right leg supported at the lower leg. Do not rest your foot on anything. Make sure your foot has room to move freely. Think of your left / right foot as a paintbrush. Move your foot to trace each letter of the alphabet in the air. Keep your hip  and knee still while you trace. Make the letters as large as you can without feeling discomfort. Trace every letter from A to Z. Repeat __________ times. Complete this exercise __________ times a day. Strengthening exercises These exercises build strength and endurance in your ankle and lower leg. Endurance is the ability to use your muscles for a long time, even after they get tired. Ankle dorsiflexion  Secure a rubber exercise band or tube to an object, such as a table leg, that will stay still when the band is pulled. Secure the other end around your left / right foot. Sit on the floor facing the object, with your left / right leg extended. The band or tube should be slightly tense when your foot is relaxed. Slowly bring your foot toward you, bringing the top of your foot toward your shin (dorsiflexion), and pulling the band tighter. Hold this position for __________ seconds. Slowly return your foot to the starting position. Repeat __________ times. Complete this exercise __________ times a  day. Ankle plantar flexion  Sit on the floor with your left / right leg extended. Loop a rubber exercise tube or band around the ball of your left / right foot. The ball of your foot is on the walking surface, right under your toes. Hold the ends of the band or tube in your hands. The band or tube should be slightly tense when your foot is relaxed. Slowly point your foot and toes downward to tilt the top of your foot away from your shin (plantar flexion). Hold this position for __________ seconds. Slowly return your foot to the starting position. Repeat __________ times. Complete this exercise __________ times a day. Ankle eversion  Sit on the floor with your legs straight out in front of you. Loop a rubber exercise band or tube around the ball of your left / right foot. The ball of your foot is on the walking surface, right under your toes. Hold the ends of the band in your hands or secure the band to a stable object. The band or tube should be slightly tense when your foot is relaxed. Slowly push your foot outward, away from your other leg (eversion). Hold this position for __________ seconds. Slowly return your foot to the starting position. Repeat __________ times. Complete this exercise __________ times a day. This information is not intended to replace advice given to you by your health care provider. Make sure you discuss any questions you have with your health care provider. Document Revised: 05/12/2022 Document Reviewed: 05/12/2022 Elsevier Patient Education  2024 Elsevier Inc.   If you have been instructed to have an in-person evaluation today at a local Urgent Care facility, please use the link below. It will take you to a list of all of our available [facility], including address, phone number and hours of operation. Please do not delay care.  [facility]  If you or a family member do not have a primary care provider, use the link below to schedule  a visit and establish care. When you choose a [facility] primary care physician or advanced practice provider, you gain a long-term partner in health. Find a Primary Care Provider  Learn more about [facility]'s in-office and virtual care options:  - Get Care Now

## 2024-03-06 ENCOUNTER — Ambulatory Visit (HOSPITAL_COMMUNITY): Admitting: Family

## 2024-03-07 ENCOUNTER — Ambulatory Visit (HOSPITAL_COMMUNITY): Admitting: Licensed Clinical Social Worker

## 2024-03-10 ENCOUNTER — Telehealth (HOSPITAL_BASED_OUTPATIENT_CLINIC_OR_DEPARTMENT_OTHER): Admitting: Family

## 2024-03-10 DIAGNOSIS — F4323 Adjustment disorder with mixed anxiety and depressed mood: Secondary | ICD-10-CM

## 2024-03-10 MED ORDER — ESCITALOPRAM OXALATE 10 MG PO TABS: 10.0000 mg | ORAL_TABLET | Freq: Every day | ORAL | 0 refills | Status: DC
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
## 2024-03-15 ENCOUNTER — Ambulatory Visit (INDEPENDENT_AMBULATORY_CARE_PROVIDER_SITE_OTHER): Admitting: Licensed Clinical Social Worker

## 2024-03-15 DIAGNOSIS — F4323 Adjustment disorder with mixed anxiety and depressed mood: Secondary | ICD-10-CM | POA: Diagnosis not present
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
## 2024-04-04 ENCOUNTER — Ambulatory Visit (HOSPITAL_COMMUNITY): Admitting: Licensed Clinical Social Worker

## 2024-04-04 DIAGNOSIS — F4323 Adjustment disorder with mixed anxiety and depressed mood: Secondary | ICD-10-CM

## 2024-04-04 NOTE — Progress Notes (Signed)
 THERAPIST PROGRESS NOTE   Session Date: 04/04/2024  Session Time: 1610 - 1710  Participation Level: Active  Behavioral Response: CasualAlertEuthymic  Type of Therapy: Individual Therapy  Treatment Goals addressed:  - Sabrina Brennan will reduce the amount of anger-related incidents/outbursts by 50% as evidenced by self-report (Anger Management) - Sabrina Brennan will identify situations, thoughts, and feelings that trigger internal anger, and/or angry/aggressive actions as evidenced by self-report (Anger Management) - Reduce frequency, intensity, and duration of depression symptoms so that daily functioning is improved (OP Depression) - Increase coping skills to manage depression and improve ability to perform daily activities (OP Depression) - Try to improve management of moods, and how I project them (OP Depression)  Progress Towards Goals: Progressing  Interventions: CBT, Motivational Interviewing, Solution Focused, and Supportive  Summary: Sabrina Brennan is a 33 y.o. female with past psych history of Adjustment disorder with mixed anxiety and depressed mood, presenting for follow-up therapy session in efforts to improve management of presenting depressive and anxious symptoms.  Patient actively engaged in session, presenting in pleasant moods, and congruent affect throughout duration of visit. 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 Pt further detailed increased stress over the past two weeks since school returning from summer break, feeling inadequately supported by admin, further exploring pt's abilities at challenging thoughts and adjusting  perspectives. Revisited previously identified area of stress surrounding relationship with mother and conflict, exploring recent interactions and pt's individual efforts at increasing boundaries in order to prevent manipulation of situations.   Patient responded well to interventions. Patient continues to meet criteria for Adjustment disorder with mixed anxiety and depressed mood. Patient will continue to benefit from engagement in outpatient therapy due to being the least restrictive service to meet presenting needs.      03/15/2024   10:40 AM 02/21/2024   11:12 AM 01/31/2024    1:10 PM 01/19/2024    4:10 PM  GAD 7 : Generalized Anxiety Score  Nervous, Anxious, on Edge 2 1 1  0  Control/stop worrying 1 0 0 2  Worry too much - different things 0 0 0 2  Trouble relaxing 1 0 1 2  Restless 0 0 1 0  Easily annoyed or irritable 2 1 2 3   Afraid - awful might happen 0 0 0 1  Total GAD 7 Score 6 2 5 10   Anxiety Difficulty Very difficult Somewhat difficult Somewhat difficult Somewhat difficult      03/15/2024   10:42 AM 02/21/2024   11:14 AM 01/31/2024    1:12 PM 01/19/2024    4:13 PM 12/19/2023    4:29 PM  Depression screen PHQ 2/9  Decreased Interest 1 0 1 1 1   Down, Depressed, Hopeless 1 0 1 0 1  PHQ - 2 Score 2 0 2 1 2   Altered sleeping 1 1 0 2 0  Tired, decreased energy 0 1 0 1 2  Change in appetite 0 1 2 2 2   Feeling bad or failure about yourself  0 0 0 0 0  Trouble concentrating 1 0 1 2 1   Moving slowly or fidgety/restless 0 0 0 0 0  Suicidal thoughts 0 0 0 0 0  PHQ-9 Score 4 3 5 8 7   Difficult doing  work/chores Somewhat difficult Somewhat difficult Somewhat difficult Very difficult Somewhat difficult    Suicidal/Homicidal: Nowithout intent/plan  Therapist Response: Clinician utilized CBT, MI, Solution Focused, and supportive reflection interventions to support patient in efforts to address presenting sxs and challenges surrounding presenting stressors.  Clinician openly greeted  patient upon joining today's virtual visit, assessing presenting mood and affect. Openly engaged patient in introductory check-in, utilizing open ended questions in order to elicit pt's recounts of recent events, observed stressors, and factors contributing to presenting moods.  Utilized active listening techniques to support pt's recounts of event, providing support and validation of identified feelings.  Utilized socratic questions to aid pt in greater critical processing of identified thoughts and perspectives, navigating cause and effect relationships, and processing variances between reactions and responses, exploring pt's individual responsibilities in recent challenging situations and stressors, and how pt could respond differently.  Utilized psycho Ed, CBT, and MI techniques to support patient in processing identified thoughts and feelings, challenging irrational thoughts, and revisiting details pertaining to healthy boundaries, and how patient may prove to navigate challenges.  Clinician reassessed severity of presenting sxs, and presence of any safety concerns. Clinician provided support and empathy to patient during session.  Homework: Continue journaling to support in the reflection and management of thoughts, and moods, and maintain efforts at implementing healthy boundaries in challenging relationships.  Plan: Return again in 2 weeks.  Diagnosis:  Encounter Diagnosis  Name Primary?   Adjustment disorder with mixed anxiety and depressed mood Yes     Collaboration of Care: Psychiatrist AEB provider documentation available in EHR.  Patient/Guardian was advised Release of Information must be obtained prior to any record release in order to collaborate their care with an outside provider. Patient/Guardian was advised if they have not already done so to contact the registration department to sign all necessary forms in order for us  to release information regarding their care.    Consent: Patient/Guardian gives verbal consent for treatment and assignment of benefits for services provided during this visit. Patient/Guardian expressed understanding and agreed to proceed.   Lynwood JONETTA Maris, MSW, LCSW 04/04/2024,  4:18 PM

## 2024-04-07 NOTE — Progress Notes (Incomplete)
 THERAPIST PROGRESS NOTE   Session Date: 04/04/2024  Session Time: 1610 - 1710  Participation Level: Active  Behavioral Response: CasualAlertEuthymic  Type of Therapy: Individual Therapy  Treatment Goals addressed:  - Ceasia will reduce the amount of anger-related incidents/outbursts by 50% as evidenced by self-report (Anger Management) - Anora will identify situations, thoughts, and feelings that trigger internal anger, and/or angry/aggressive actions as evidenced by self-report (Anger Management) - Reduce frequency, intensity, and duration of depression symptoms so that daily functioning is improved (OP Depression) - Increase coping skills to manage depression and improve ability to perform daily activities (OP Depression) - Try to improve management of moods, and how I project them (OP Depression)  Progress Towards Goals: Progressing  Interventions: CBT, Motivational Interviewing, Solution Focused, and Supportive  Summary: Stanisha is a 33 y.o. female with past psych history of Adjustment disorder with mixed anxiety and depressed mood, presenting for follow-up therapy session in efforts to improve management of presenting depressive and anxious symptoms.  Patient actively engaged in session, presenting in pleasant moods, and congruent affect throughout duration of visit. Pt openly engaged in introductory check-in,       and congruent affect when processing recent events. Pt actively engaged in introductory check-in, sharing of Doing good. Briefly reviewed recent med man visit with IVAR Kerns, NP, noting of no changes. 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 Reassessed depressive and anxious sxs via  PHQ-9 and GAD-7, noting of minimal increase in depressive sxs and mild increase in anxiousness, sharing of Last two weeks were horrible, detailing having gotten into conflict with mother surrounding mother's alcohol use, escalating to mother being aggressive and angry towards pt. Pt further shared of father getting married and having not replied to [employer] text, having called later and apologizing, and conflict with sister regarding her having taken pt's Gucci sandals.  Patient further detailed of increased stress surrounding relationship with mother and upcoming cruise and challenging feelings toward mother, leading patient to consider making adjustments to trip accommodation bookings. Engaged further in processing challenges surrounding familial relationships and the observed improvements when pt proves to implement firm boundaries, encouraging exploration of increasing further efforts of doing so.  Patient responded well to interventions. Patient continues to meet criteria for Adjustment disorder with mixed anxiety and depressed mood. Patient will continue to benefit from engagement in outpatient therapy due to being the least restrictive service to meet presenting needs.      03/15/2024   10:40 AM 02/21/2024   11:12 AM 01/31/2024    1:10 PM 01/19/2024    4:10 PM  GAD 7 : Generalized Anxiety Score  Nervous, Anxious, on Edge 2 1 1  0  Control/stop worrying 1 0 0 2  Worry too much - different things 0 0 0 2  Trouble relaxing 1 0 1 2  Restless 0 0 1 0  Easily annoyed or irritable 2 1 2 3   Afraid - awful might happen 0 0 0 1  Total GAD 7 Score 6 2 5 10   Anxiety Difficulty Very difficult Somewhat difficult Somewhat difficult Somewhat difficult      03/15/2024   10:42 AM 02/21/2024   11:14 AM 01/31/2024    1:12 PM 01/19/2024    4:13 PM 12/19/2023    4:29  PM  Depression screen PHQ 2/9  Decreased Interest 1 0 1 1 1   Down, Depressed, Hopeless 1 0 1 0 1  PHQ - 2 Score 2 0 2 1 2   Altered  sleeping 1 1 0 2 0  Tired, decreased energy 0 1 0 1 2  Change in appetite 0 1 2 2 2   Feeling bad or failure about yourself  0 0 0 0 0  Trouble concentrating 1 0 1 2 1   Moving slowly or fidgety/restless 0 0 0 0 0  Suicidal thoughts 0 0 0 0 0  PHQ-9 Score 4 3 5 8 7   Difficult doing work/chores Somewhat difficult Somewhat difficult Somewhat difficult Very difficult Somewhat difficult    Suicidal/Homicidal: Nowithout intent/plan  Therapist Response: Clinician utilized CBT, MI, Solution Focused, and supportive reflection interventions to support patient in efforts to address presenting sxs and challenges surrounding presenting stressors.  Clinician actively greeted patient upon joining today's virtual visit, assessing presenting mood and affect. Openly *** engaged patient in introductory check-in, observing patient's increased tension, engaging in reassessing presenting depressive and anxious symptoms, relating of continued downward trend and depressive symptoms within the minimal range, and recent increase in anxious symptoms to the point of mild range, further prompting patient's engagement in recounts of the events and factors contributing to presenting moods and variances and symptoms. Actively listened to the patient's reflections of recent events, providing support, validation, and empathy for patient's expressed challenges. Utilized psycho Ed, CBT, and MI techniques to support patient in processing identified thoughts and feelings, challenging irrational thoughts, and revisiting details pertaining to healthy boundaries, and how patient may prove to navigate challenges.  Clinician reassessed severity of presenting sxs, and presence of any safety concerns. Clinician provided support and empathy to patient during session.  Homework: Continue journaling to support in the reflection and management of thoughts, and moods, and maintain efforts at implementing healthy boundaries in challenging  relationships.  Plan: Return again in 3 weeks.  Diagnosis:  Encounter Diagnosis  Name Primary?  . Adjustment disorder with mixed anxiety and depressed mood Yes     Collaboration of Care: Psychiatrist AEB provider documentation available in EHR.  Patient/Guardian was advised Release of Information must be obtained prior to any record release in order to collaborate their care with an outside provider. Patient/Guardian was advised if they have not already done so to contact the registration department to sign all necessary forms in order for us  to release information regarding their care.   Consent: Patient/Guardian gives verbal consent for treatment and assignment of benefits for services provided during this visit. Patient/Guardian expressed understanding and agreed to proceed.   Lynwood JONETTA Maris, MSW, LCSW 04/04/2024,  4:18 PM

## 2024-04-12 ENCOUNTER — Ambulatory Visit (HOSPITAL_COMMUNITY): Admitting: Licensed Clinical Social Worker

## 2024-04-16 ENCOUNTER — Telehealth: Payer: Self-pay

## 2024-04-16 ENCOUNTER — Ambulatory Visit (INDEPENDENT_AMBULATORY_CARE_PROVIDER_SITE_OTHER)

## 2024-04-16 DIAGNOSIS — Z3042 Encounter for surveillance of injectable contraceptive: Secondary | ICD-10-CM

## 2024-04-16 MED ORDER — MEDROXYPROGESTERONE ACETATE 150 MG/ML IM SUSP
150.0000 mg | Freq: Once | INTRAMUSCULAR | Status: AC
  Administered 2024-04-16: 150 mg via INTRAMUSCULAR

## 2024-04-16 NOTE — Progress Notes (Signed)
 Patient here today for Depo Provera  injection and is within her dates.    Last contraceptive appt was 08/22/2023  Depo given in [facility] today.  Site unremarkable & patient tolerated injection.    Next injection due 07/02/24-07/16/2024.  Reminder card given.    Chiquita JAYSON English, RN

## 2024-04-16 NOTE — Telephone Encounter (Signed)
 Patient in nurse clinic afternoon requesting refill on scopolamine  patches.   Dr. Dameron previously prescribed patches for patient for use on cruise. Patient is leaving for another cruise on Saturday morning. Patient is requesting refill on this medication prior to leaving for trip.   Will forward to PCP.   Chiquita JAYSON English, RN

## 2024-04-17 MED ORDER — SCOPOLAMINE 1 MG/3DAYS TD PT72: 1.0000 | MEDICATED_PATCH | TRANSDERMAL | 0 refills | Status: AC

## 2024-04-29 ENCOUNTER — Telehealth: Admitting: Family

## 2024-04-29 DIAGNOSIS — B350 Tinea barbae and tinea capitis: Secondary | ICD-10-CM

## 2024-04-29 DIAGNOSIS — L239 Allergic contact dermatitis, unspecified cause: Secondary | ICD-10-CM | POA: Diagnosis not present

## 2024-04-29 MED ORDER — FLUCONAZOLE 150 MG PO TABS: 150.0000 mg | ORAL_TABLET | ORAL | 0 refills | Status: AC | PRN

## 2024-04-29 MED ORDER — KETOCONAZOLE 2 % EX SHAM: 1.0000 | MEDICATED_SHAMPOO | CUTANEOUS | 2 refills | Status: AC

## 2024-04-29 MED ORDER — TRIAMCINOLONE ACETONIDE 0.1 % EX CREA: 1.0000 | TOPICAL_CREAM | Freq: Two times a day (BID) | CUTANEOUS | 0 refills | Status: AC

## 2024-04-29 NOTE — Patient Instructions (Signed)
 Contact Dermatitis Dermatitis is redness, soreness, and swelling (inflammation) of the skin. Contact dermatitis is a reaction to certain substances that touch the skin. There are two types of this condition: Irritant contact dermatitis. This is the most common type. It happens when something irritates your skin, such as when your hands get dry from washing them too often with soap. You can get this type of reaction even if you have not been exposed to the irritant before. Allergic contact dermatitis. This type is caused by a substance that you are allergic to, such as poison ivy. It occurs when you have been exposed to the substance (allergen) and form a sensitivity to it. In some cases, the reaction may start soon after your first exposure to the allergen. In other cases, it may not start until you are exposed to the allergen again. It may then occur every time you are exposed to the allergen in the future. What are the causes? Irritant contact dermatitis is often caused by exposure to: Makeup. Soaps, detergents, and bleaches. Acids. Metal salts, such as nickel. Allergic contact dermatitis is often caused by exposure to: Poisonous plants. Chemicals. Jewelry. Latex. Medicines. Preservatives in products, such as clothes. What increases the risk? You are more likely to get this condition if you have: A job that exposes you to irritants or allergens. Certain medical conditions. These include asthma and eczema. What are the signs or symptoms? Symptoms of this condition may occur in any place on your body that has been touched by the irritant. Symptoms include: Dryness, flaking, or cracking. Redness. Itching. Pain or a burning feeling. Blisters. Drainage of small amounts of blood or clear fluid from skin cracks. With allergic contact dermatitis, there may also be swelling in areas such as the eyelids, mouth, or genitals. How is this diagnosed? This condition is diagnosed with a medical  history and physical exam. A patch skin test may be done to help figure out the cause. If the condition is related to your job, you may need to see an expert in health problems in the workplace (occupational medicine specialist). How is this treated? This condition is treated by staying away from the cause of the reaction and protecting your skin from further contact. Treatment may also include: Steroid creams or ointments. Steroid medicines may need be taken by mouth (orally) in more severe cases. Antibiotics or medicines applied to the skin to kill bacteria (antibacterial ointments). These may be needed if a skin infection is present. Antihistamines. These may be taken orally or put on as a lotion to ease itching. A bandage (dressing). Follow these instructions at home: Skin care Moisturize your skin as needed. Put cool, wet cloths (cool compresses) on the affected areas. Try applying baking soda paste to your skin. Stir water into baking soda until it has the consistency of a paste. Do not scratch your skin. Avoid friction to the affected area. Avoid the use of soaps, perfumes, and dyes. Check the affected areas every day for signs of infection. Check for: More redness, swelling, or pain. More fluid or blood. Warmth. Pus or a bad smell. Medicines Take or apply over-the-counter and prescription medicines only as told by your health care provider. If you were prescribed antibiotics, take or apply them as told by your health care provider. Do not stop using the antibiotic even if you start to feel better. Bathing Try taking a bath with: Epsom salts. Follow the instructions on the packaging. You can get these at your local pharmacy  or grocery store. Baking soda. Pour a small amount into the bath as told by your health care provider. Colloidal oatmeal. Follow the instructions on the packaging. You can get this at your local pharmacy or grocery store. Bathe less often. This may mean bathing  every other day. Bathe in lukewarm water. Avoid using hot water. Bandage care If you were given a dressing, change it as told by your health care provider. Wash your hands with soap and water for at least 20 seconds before and after you change your dressing. If soap and water are not available, use hand sanitizer. General instructions Avoid the substance that caused your reaction. If you do not know what caused it, keep a journal to try to track what caused it. Write down: What you eat and drink. What cosmetics you use. What you wear in the affected area. This includes jewelry. Contact a health care provider if: Your condition does not get better with treatment. Your condition gets worse. You have any signs of infection. You have a fever. You have new symptoms. Your bone or joint under the affected area becomes painful after the skin has healed. Get help right away if: You notice red streaks coming from the affected area. The affected area turns darker. You have trouble breathing. This information is not intended to replace advice given to you by your health care provider. Make sure you discuss any questions you have with your health care provider. Document Revised: 01/22/2022 Document Reviewed: 01/22/2022 Elsevier Patient Education  2024 [employer].

## 2024-04-29 NOTE — Progress Notes (Signed)
 Virtual Visit Consent   Sabrina Brennan, you are scheduled for a virtual visit with a [facility] provider today. Just as with appointments in the office, your consent must be obtained to participate. Your consent will be active for this visit and any virtual visit you may have with one of our providers in the next 365 days. If you have a MyChart account, a copy of this consent can be sent to you electronically.  As this is a virtual visit, video technology does not allow for your provider to perform a traditional examination. This may limit your provider's ability to fully assess your condition. If your provider identifies any concerns that need to be evaluated in person or the need to arrange testing (such as labs, EKG, etc.), we will make arrangements to do so. Although advances in technology are sophisticated, we cannot ensure that it will always work on either your end or our end. If the connection with a video visit is poor, the visit may have to be switched to a telephone visit. With either a video or telephone visit, we are not always able to ensure that we have a secure connection.  By engaging in this virtual visit, you consent to the provision of healthcare and authorize for your insurance to be billed (if applicable) for the services provided during this visit. Depending on your insurance coverage, you may receive a charge related to this service.  I need to obtain your verbal consent now. Are you willing to proceed with your visit today? Sabrina Brennan has provided verbal consent on 04/29/2024 for a virtual visit (video or telephone). Bari Learn, FNP  Date: 04/29/2024 9:23 AM   Virtual Visit via Video Note   I, Bari Learn, [name]  ([id number], March 11, 1991) on 04/29/24 at  9:15 AM EDT by a video-enabled telemedicine application and verified that I am speaking with the correct person using two identifiers.  Location: Patient: Virtual Visit Location Patient:  Other: airport Provider: Virtual Visit Location Provider: Home Office   I discussed the limitations of evaluation and management by telemedicine and the availability of in person appointments. The patient expressed understanding and agreed to proceed.    History of Present Illness: Sabrina Brennan is a 33 y.o. who identifies as a female who was assigned female at birth, and is being seen today for rash on her neck, face, and bilateral arms.  States she had this a similar rash as was treated as a fungal infection and given a shampoo that cleared it up.   She also reports she has a sunburn on bilateral arms.   HPI: Rash This is a recurrent problem. The current episode started in the past 7 days. The problem has been gradually worsening since onset. The affected locations include the head, face, neck, chest and back. The rash is characterized by itchiness and burning. Associated with: was [place] country. Past treatments include anti-itch cream. The treatment provided mild relief.    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 (Patient not taking: Reported on 11/14/2023), Disp: 8 g, Rfl: 0   albuterol  (VENTOLIN  HFA) 108 (90 Base) MCG/ACT inhaler, Inhale 2 puffs into the lungs every 6 (six) hours as needed for wheezing or shortness of breath., Disp: 8 g, Rfl:  0   benzonatate  (TESSALON ) 100 MG capsule, Take 1-2 capsules (100-200 mg total) by mouth 3 (three) times daily as needed., Disp: 30 capsule, Rfl: 0   cetirizine  (ZYRTEC ) 10 MG tablet, Take 1 tablet (10 mg total) by mouth daily., Disp: 30 tablet, Rfl: 0   cyclobenzaprine  (FLEXERIL ) 5 MG tablet, Take 1 tablet (5 mg total) by mouth at bedtime as needed. (Patient not taking: Reported on 11/14/2023), Disp: 30 tablet, Rfl: 0   docusate sodium  (COLACE) 100 MG capsule, Take 1 capsule (100 mg total) by mouth every 12 (twelve) hours. (Patient not taking: Reported on 11/14/2023), Disp: 60 capsule, Rfl: 0   escitalopram  (LEXAPRO ) 10 MG tablet, Take 1 tablet (10 mg total) by mouth daily., Disp: 60 tablet, Rfl: 0   fluconazole  (DIFLUCAN ) 150 MG tablet, Take 1 tablet (150 mg total) by mouth every three (3) days as needed., Disp: 2 tablet, Rfl: 0   fluticasone  (FLONASE ) 50 MCG/ACT nasal spray, Place 2 sprays into both nostrils daily., Disp: 16 g, Rfl: 0   HYDROcodone -acetaminophen  (NORCO/VICODIN) 5-325 MG tablet, Take 1 tablet by mouth every 4 (four) hours as needed for severe pain (pain score 7-10)., Disp: 5 tablet, Rfl: 0   hydrOXYzine  (ATARAX ) 10 MG tablet, Take 1 tablet (10 mg total) by mouth 3 (three) times daily as needed. May take 2 tablets ( 20mg  total) at night for sleep disturbance, Disp: 30 tablet, Rfl: 0   ketoconazole  (NIZORAL ) 2 % shampoo, Apply 1 Application topically 2 (two) times a week., Disp: 120 mL, Rfl: 0   [START ON 04/30/2024] ketoconazole  (NIZORAL ) 2 % shampoo, Apply 1 Application topically 2 (two) times a week., Disp: 120 mL, Rfl: 2   predniSONE  (DELTASONE ) 20 MG tablet, Take 2 tablets daily with breakfast., Disp: 10 tablet, Rfl: 0   promethazine -dextromethorphan  (PROMETHAZINE -DM) 6.25-15 MG/5ML syrup, Take 5 mLs by mouth 3 (three) times daily as needed for cough., Disp: 200 mL, Rfl: 0   propranolol  (INDERAL ) 10 MG tablet, Take 1 tablet (10 mg total) by mouth 3 (three) times daily., Disp: 10  tablet, Rfl: 0   scopolamine  (TRANSDERM-SCOP) 1 MG/3DAYS, Place 1 patch (1 mg total) onto the skin every 3 (three) days., Disp: 4 patch, Rfl: 0   triamcinolone cream (KENALOG) 0.1 %, Apply 1 Application topically 2 (two) times daily., Disp: 30 g, Rfl: 0  Observations/Objective: Patient is well-developed, well-nourished in no acute distress.  Resting comfortably   Head is normocephalic, atraumatic.  No labored breathing.  Speech is clear and coherent with logical content.  Patient is alert and oriented at baseline.  Dry white rash on neck going into hair, cheeks Erythemas papule rash on arms and back  Assessment and Plan: 1. Tinea capitis - ketoconazole  (NIZORAL ) 2 % shampoo; Apply 1 Application topically 2 (two) times a week.  Dispense: 120 mL; Refill: 2 - fluconazole  (DIFLUCAN ) 150 MG tablet; Take 1 tablet (150 mg total) by mouth every three (3) days as needed.  Dispense: 2 tablet; Refill: 0  2. Allergic contact dermatitis, unspecified trigger (Primary) - triamcinolone cream (KENALOG) 0.1 %; Apply 1  Application topically 2 (two) times daily.  Dispense: 30 g; Refill: 0  Avoid scratching  Keep clean and dry Start kenalog on arms and places that are itching Ketoconazole  for hair and neck Follow up if symptoms worsen or do not improve   Follow Up Instructions: I discussed the assessment and treatment plan with the patient. The patient was provided an opportunity to ask questions and all were answered. The patient agreed with the plan and demonstrated an understanding of the instructions.  A copy of instructions were sent to the patient via MyChart unless otherwise noted below.     The patient was advised to call back or seek an in-person evaluation if the symptoms worsen or if the condition fails to improve as anticipated.    Bari Learn, FNP

## 2024-04-30 ENCOUNTER — Ambulatory Visit (HOSPITAL_COMMUNITY): Admitting: Licensed Clinical Social Worker

## 2024-05-07 ENCOUNTER — Ambulatory Visit (HOSPITAL_COMMUNITY): Admitting: Licensed Clinical Social Worker

## 2024-05-11 ENCOUNTER — Ambulatory Visit (INDEPENDENT_AMBULATORY_CARE_PROVIDER_SITE_OTHER): Admitting: Licensed Clinical Social Worker

## 2024-05-11 DIAGNOSIS — F4323 Adjustment disorder with mixed anxiety and depressed mood: Secondary | ICD-10-CM | POA: Diagnosis not present

## 2024-05-11 NOTE — Progress Notes (Signed)
 THERAPIST PROGRESS NOTE   Session Date: 05/11/2024  Session Time: 1115 - 1219  Participation Level: Active  Behavioral Response: CasualAlertEuthymic  Type of Therapy: Individual Therapy  Treatment Goals addressed:  - Sabrina Brennan will reduce the amount of anger-related incidents/outbursts by 50% as evidenced by self-report (Anger Management) - Sabrina Brennan will identify situations, thoughts, and feelings that trigger internal anger, and/or angry/aggressive actions as evidenced by self-report (Anger Management) - Reduce frequency, intensity, and duration of depression symptoms so that daily functioning is improved (OP Depression) - Increase coping skills to manage depression and improve ability to perform daily activities (OP Depression) - Try to improve management of moods, and how I project them (OP Depression)  Progress Towards Goals: Progressing  Interventions: CBT, Motivational Interviewing, Solution Focused, and Supportive  Summary: Sabrina Brennan is a 33 y.o. female with past psych history of Adjustment disorder with mixed anxiety and depressed mood, presenting for follow-up therapy session in efforts to improve management of presenting depressive and anxious symptoms.  Patient actively engaged in session, presenting in pleasant moods, and congruent affect throughout duration of visit. Pt openly engaged in introductory check-in, sharing of having gone to ortho appt this morning having received steroid shot due to having learned of having two torn ligaments from July injury. Pt engaged in reflection of events of the past month, sharing of the first two weeks since past visit were terrible, further detailing difficulties surrounding coursework necessary for licensure, having navigated challenges to allow further accommodations. Engaged in further processing of most recent two weeks, providing recounts of cruise with mother going well, having enjoyed self, and planning future trips over the past week  since return. Pt shared of having increased social engagement over the past week due to being [employer], attending frequent social events and enjoyable activities. Pt shared recent challenges surrounding interactions with father, further processing alternate perspectives, and factors contributing to irritability within the workplace yesterday. Processed importance of increased sleep in improvements in management of moods.     03/15/2024   10:40 AM 02/21/2024   11:12 AM 01/31/2024    1:10 PM 01/19/2024    4:10 PM  GAD 7 : Generalized Anxiety Score  Nervous, Anxious, on Edge 2 1 1  0  Control/stop worrying 1 0 0 2  Worry too much - different things 0 0 0 2  Trouble relaxing 1 0 1 2  Restless 0 0 1 0  Easily annoyed or irritable 2 1 2 3   Afraid - awful might happen 0 0 0 1  Total GAD 7 Score 6 2 5 10   Anxiety Difficulty Very difficult Somewhat difficult Somewhat difficult Somewhat difficult      03/15/2024   10:42 AM 02/21/2024   11:14 AM 01/31/2024    1:12 PM 01/19/2024    4:13 PM 12/19/2023    4:29 PM  Depression screen PHQ 2/9  Decreased Interest 1 0 1 1 1   Down, Depressed, Hopeless 1 0 1 0 1  PHQ - 2 Score 2 0 2 1 2   Altered sleeping 1 1 0 2 0  Tired, decreased energy 0 1 0 1 2  Change in appetite 0 1 2 2 2   Feeling bad or failure about yourself  0 0 0 0 0  Trouble concentrating 1 0 1 2 1   Moving slowly or fidgety/restless 0 0 0 0 0  Suicidal thoughts 0 0 0 0 0  PHQ-9 Score 4 3 5 8 7   Difficult doing work/chores Somewhat difficult Somewhat difficult Somewhat difficult  Very difficult Somewhat difficult    Suicidal/Homicidal: Nowithout intent/plan  Therapist Response:  Clinician openly greeted patient upon joining today's virtual visit, assessing presenting mood and affect, engaging in introductory check-in, exploring morning events and presenting moods. Utilized open ended questions in eliciting pt's recounts of events of the past month, newly identified stressors, recurring  stressors, individual efforts in navigating challenges, and factors contributing to moods. Utilized active listening techniques to support pt's recounts of event, providing support and validation of identified thoughts and feelings, further prompting pt's exploration of rational vs irrational thoughts, and how these prove to impact pt's feelings and actions. Utilized socratic questioning to aid greater critical processing of thoughts and perspectives in relation to recent events, and how these prove to impact pt overall. Utilized psycho Ed, CBT, MI, and supportive reflection interventions to support patient in processing challenges, and navigating potential alternate approaches. Clinician reassessed severity of presenting sxs, and presence of any safety concerns.  [x]  Cognitive Challenging []  Cognitive Refocusing [x]  Cognitive Reframing  [x]  Communication Skills []  Compliance Issues []  DBT []  Exploration of Coping Patterns [x]  Exploration of Emotions []  Exploration of Relationship Patterns []  Guided Imagery []  Interactive Feedback []  Interpersonal Resolutions []  Mindfulness Training []  Preventative Services [x]  Psycho-Education  []  Relaxation/Deep Breathing []  Review of Treatment Plan/Progress []  Role-Play/Behavioral Rehearsal  [x]  Structured Problem Solving []  Supportive Reflection [x]  Symptom Management  []  Other   Patient responded well to interventions. Patient continues to meet criteria for Adjustment disorder with mixed anxiety and depressed mood. Patient will continue to benefit from engagement in outpatient therapy due to being the least restrictive service to meet presenting needs.   Homework: None.  Plan: Return again in 2 weeks.  Diagnosis:  Encounter Diagnosis  Name Primary?   Adjustment disorder with mixed anxiety and depressed mood Yes      Collaboration of Care: Psychiatrist AEB provider documentation available in EHR.  Patient/Guardian was advised Release of  Information must be obtained prior to any record release in order to collaborate their care with an outside provider. Patient/Guardian was advised if they have not already done so to contact the registration department to sign all necessary forms in order for us  to release information regarding their care.   Consent: Patient/Guardian gives verbal consent for treatment and assignment of benefits for services provided during this visit. Patient/Guardian expressed understanding and agreed to proceed.   Virtual Visit via Video Note  I connected with Sabrina Brennan on 05/11/24 at 11:00 AM EDT by a video enabled telemedicine application and verified that I am speaking with the correct person using two identifiers.  Location: Patient: Home Provider: Home Office   I discussed the limitations of evaluation and management by telemedicine and the availability of in person appointments. The patient expressed understanding and agreed to proceed.  I discussed the assessment and treatment plan with the patient. The patient was provided an opportunity to ask questions and all were answered. The patient agreed with the plan and demonstrated an understanding of the instructions.   The patient was advised to call back or seek an in-person evaluation if the symptoms worsen or if the condition fails to improve as anticipated.  I provided 64 minutes of non-face-to-face time during this encounter.   Sabrina Brennan, MSW, LCSW 05/11/2024,  11:19 AM

## 2024-05-16 ENCOUNTER — Ambulatory Visit (INDEPENDENT_AMBULATORY_CARE_PROVIDER_SITE_OTHER): Admitting: Licensed Clinical Social Worker

## 2024-05-16 DIAGNOSIS — F4323 Adjustment disorder with mixed anxiety and depressed mood: Secondary | ICD-10-CM

## 2024-05-16 NOTE — Progress Notes (Signed)
 THERAPIST PROGRESS NOTE   Session Date: 05/16/2024  Session Time: 1613 - 1707  Participation Level: Active  Behavioral Response: CasualAlertEuthymic  Type of Therapy: Individual Therapy  Treatment Goals addressed:  - Sabrina Brennan will reduce the amount of anger-related incidents/outbursts by 50% as evidenced by self-report (Anger Management) - Sabrina Brennan will identify situations, thoughts, and feelings that trigger internal anger, and/or angry/aggressive actions as evidenced by self-report (Anger Management) - Reduce frequency, intensity, and duration of depression symptoms so that daily functioning is improved (OP Depression) - Increase coping skills to manage depression and improve ability to perform daily activities (OP Depression) - Try to improve management of moods, and how I project them (OP Depression)  Progress Towards Goals: Progressing  Interventions: CBT, Motivational Interviewing, Solution Focused, and Supportive  Summary: Sabrina Brennan is a 33 y.o. female with past psych history of Adjustment disorder with mixed anxiety and depressed mood, presenting for follow-up therapy session in efforts to improve management of presenting depressive and anxious symptoms.  Patient actively engaged in session, presenting in pleasant moods, and congruent affect throughout duration of visit. 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 Pt further detailed newly added stress occurring within relation with mother again over the weekend in relation to a brunch pt's mother had prepared, and insisting that pt and pt's siblings reimburse mother's expenses, further escalating conflict in relation to  finances between pt and mother, with mother proving to owe pt money from recent cruise. Pt detailed of observing increased stress in academic setting and conflict with mother presenting in sxs including lack of interest, decreased frustration tolerance, and fatigue.     03/15/2024   10:40 AM 02/21/2024   11:12 AM 01/31/2024    1:10 PM 01/19/2024    4:10 PM  GAD 7 : Generalized Anxiety Score  Nervous, Anxious, on Edge 2 1 1  0  Control/stop worrying 1 0 0 2  Worry too much - different things 0 0 0 2  Trouble relaxing 1 0 1 2  Restless 0 0 1 0  Easily annoyed or irritable 2 1 2 3   Afraid - awful might happen 0 0 0 1  Total GAD 7 Score 6 2 5 10   Anxiety Difficulty Very difficult Somewhat difficult Somewhat difficult Somewhat difficult      03/15/2024   10:42 AM 02/21/2024   11:14 AM 01/31/2024    1:12 PM 01/19/2024    4:13 PM 12/19/2023    4:29 PM  Depression screen PHQ 2/9  Decreased Interest 1 0 1 1 1   Down, Depressed, Hopeless 1 0 1 0 1  PHQ - 2 Score 2 0 2 1 2   Altered sleeping 1 1 0 2 0  Tired, decreased energy 0 1 0 1 2  Change in appetite 0 1 2 2 2   Feeling bad or failure about yourself  0 0 0 0 0  Trouble concentrating 1 0 1 2 1   Moving slowly or fidgety/restless 0 0 0 0 0  Suicidal thoughts 0 0 0 0 0  PHQ-9 Score 4 3 5 8 7   Difficult doing work/chores Somewhat difficult Somewhat difficult Somewhat difficult Very difficult Somewhat difficult    Suicidal/Homicidal: Nowithout intent/plan  Therapist Response:  Clinician openly greeted patient upon joining today's virtual visit,  assessing presenting mood and affect, engaging in introductory check-in, exploring daily events and presenting moods. Utilized open ended questions in eliciting pt's recounts of events of the past week, newly presenting stressors throughout the week, recurring stressors, individual challenges navigating stressors leading to worsening irritability and conflicts, and factors contributing to moods. Utilized  active listening techniques to support pt's recounts of event, providing support and validation of identified thoughts and perspectives in relation to challenges and stressors, further supporting in processing rationale of thoughts and perspectives, and exploration of pt's response to stress. Utilized psycho Ed, CBT, and MI techniques to support patient in processing challenges, and navigating potential alternate approaches. Clinician reassessed severity of presenting sxs, and presence of any safety concerns.  [x]  Cognitive Challenging []  Cognitive Refocusing [x]  Cognitive Reframing  [x]  Communication Skills []  Compliance Issues []  DBT []  Exploration of Coping Patterns [x]  Exploration of Emotions []  Exploration of Relationship Patterns []  Guided Imagery []  Interactive Feedback []  Interpersonal Resolutions []  Mindfulness Training []  Preventative Services [x]  Psycho-Education  []  Relaxation/Deep Breathing []  Review of Treatment Plan/Progress []  Role-Play/Behavioral Rehearsal  [x]  Structured Problem Solving [x]  Supportive Reflection [x]  Symptom Management  []  Other   Patient responded well to interventions. Patient continues to meet criteria for Adjustment disorder with mixed anxiety and depressed mood. Patient will continue to benefit from engagement in outpatient therapy due to being the least restrictive service to meet presenting needs.   Homework: None.  Plan: Return again in 2 weeks.  Diagnosis:  Encounter Diagnosis  Name Primary?   Adjustment disorder with mixed anxiety and depressed mood Yes       Collaboration of Care: Psychiatrist AEB provider documentation available in EHR.  Patient/Guardian was advised Release of Information must be obtained prior to any record release in order to collaborate their care with an outside provider. Patient/Guardian was advised if they have not already done so to contact the registration department to sign all necessary forms in order for us  to  release information regarding their care.   Consent: Patient/Guardian gives verbal consent for treatment and assignment of benefits for services provided during this visit. Patient/Guardian expressed understanding and agreed to proceed.   Virtual Visit via Video Note  I connected with Sabrina Brennan on 05/16/24 at  4:00 PM EDT by a video enabled telemedicine application and verified that I am speaking with the correct person using two identifiers.  Location: Patient: Home Provider: Home Office   I discussed the limitations of evaluation and management by telemedicine and the availability of in person appointments. The patient expressed understanding and agreed to proceed.  I discussed the assessment and treatment plan with the patient. The patient was provided an opportunity to ask questions and all were answered. The patient agreed with the plan and demonstrated an understanding of the instructions.   The patient was advised to call back or seek an in-person evaluation if the symptoms worsen or if the condition fails to improve as anticipated.  I provided 54 minutes of non-face-to-face time during this encounter.   Sabrina Brennan, MSW, LCSW 05/16/2024,  4:14 PM

## 2024-05-29 ENCOUNTER — Telehealth: Admitting: Physician Assistant

## 2024-05-29 ENCOUNTER — Ambulatory Visit (INDEPENDENT_AMBULATORY_CARE_PROVIDER_SITE_OTHER): Admitting: Licensed Clinical Social Worker

## 2024-05-29 DIAGNOSIS — F4323 Adjustment disorder with mixed anxiety and depressed mood: Secondary | ICD-10-CM | POA: Diagnosis not present

## 2024-05-29 DIAGNOSIS — J019 Acute sinusitis, unspecified: Secondary | ICD-10-CM

## 2024-05-29 DIAGNOSIS — B9689 Other specified bacterial agents as the cause of diseases classified elsewhere: Secondary | ICD-10-CM | POA: Diagnosis not present

## 2024-05-29 MED ORDER — AMOXICILLIN-POT CLAVULANATE 875-125 MG PO TABS: 1.0000 | ORAL_TABLET | Freq: Two times a day (BID) | ORAL | 0 refills | Status: AC

## 2024-05-29 MED ORDER — IBUPROFEN 600 MG PO TABS: 600.0000 mg | ORAL_TABLET | Freq: Three times a day (TID) | ORAL | 0 refills | Status: AC | PRN

## 2024-05-29 NOTE — Progress Notes (Unsigned)
 THERAPIST PROGRESS NOTE   Session Date: 05/29/2024  Session Time: 1107 - 1208  Participation Level: Active  Behavioral Response: CasualAlertEuthymic  Type of Therapy: Individual Therapy  Treatment Goals addressed:  - Huntleigh will reduce the amount of anger-related incidents/outbursts by 50% as evidenced by self-report (Anger Management) - Alliene will identify situations, thoughts, and feelings that trigger internal anger, and/or angry/aggressive actions as evidenced by self-report (Anger Management) - Reduce frequency, intensity, and duration of depression symptoms so that daily functioning is improved (OP Depression) - Increase coping skills to manage depression and improve ability to perform daily activities (OP Depression) - Try to improve management of moods, and how I project them (OP Depression)  Progress Towards Goals: Progressing  Interventions: CBT, Motivational Interviewing, Solution Focused, and Supportive  Summary: Sabrina Brennan is a 33 y.o. female with past psych history of Adjustment disorder with mixed anxiety and depressed mood, presenting for follow-up therapy session in efforts to improve management of presenting depressive and anxious symptoms.  Patient actively engaged in session, presenting in pleasant moods, and congruent affect throughout duration of visit. Pt openly engaged in introductory check-in, sharing of I had a really, really bad day yesterday, further detailing of I almost had a full crash out yesterday, sharing of children being unmanageable in the school setting and not receiving support from children's parents nor support from admin. 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 Pt further detailed newly added stress occurring within relation with mother again over the weekend in relation to a brunch pt's mother had prepared, and insisting that pt and pt's siblings reimburse mother's expenses, further escalating conflict in relation to finances between pt and mother, with mother proving to owe pt money from recent cruise. Pt detailed of observing increased stress in academic setting and conflict with mother presenting in sxs including lack of interest, decreased frustration tolerance, and fatigue.     03/15/2024   10:40 AM 02/21/2024   11:12 AM 01/31/2024    1:10 PM 01/19/2024    4:10 PM  GAD 7 : Generalized Anxiety Score  Nervous, Anxious, on Edge 2 1 1  0  Control/stop worrying 1 0 0 2  Worry too much - different things 0 0 0 2  Trouble relaxing 1 0 1 2  Restless 0 0 1 0  Easily annoyed or irritable 2 1 2 3   Afraid - awful might happen 0 0 0 1  Total GAD 7 Score 6 2 5 10   Anxiety Difficulty Very difficult Somewhat difficult Somewhat difficult Somewhat difficult      03/15/2024   10:42 AM 02/21/2024   11:14 AM 01/31/2024    1:12 PM 01/19/2024    4:13 PM 12/19/2023    4:29 PM  Depression screen PHQ 2/9  Decreased Interest 1 0 1 1 1   Down, Depressed, Hopeless 1 0 1 0 1  PHQ - 2 Score 2 0 2 1 2   Altered sleeping 1 1 0 2 0  Tired, decreased energy 0 1 0 1 2  Change in appetite 0 1 2 2 2   Feeling bad or failure about yourself  0 0 0 0 0  Trouble concentrating 1 0 1 2 1   Moving slowly or fidgety/restless 0 0 0  0 0  Suicidal thoughts 0 0 0 0 0  PHQ-9 Score 4 3 5 8 7   Difficult doing work/chores Somewhat difficult Somewhat difficult Somewhat difficult Very difficult Somewhat difficult    Suicidal/Homicidal: Nowithout intent/plan  Therapist Response:  Clinician *** openly greeted patient upon joining today's virtual visit, assessing presenting mood and affect, engaging in introductory check-in, exploring daily events and  presenting moods. Utilized open ended questions in eliciting pt's recounts of events of the past week, newly presenting stressors throughout the week, recurring stressors, individual challenges navigating stressors leading to worsening irritability and conflicts, and factors contributing to moods. Utilized active listening techniques to support pt's recounts of event, providing support and validation of identified thoughts and perspectives in relation to challenges and stressors, further supporting in processing rationale of thoughts and perspectives, and exploration of pt's response to stress. Utilized psycho Ed, CBT, and MI techniques to support patient in processing challenges, and navigating potential alternate approaches. Clinician reassessed severity of presenting sxs, and presence of any safety concerns.  [x]  Cognitive Challenging []  Cognitive Refocusing [x]  Cognitive Reframing  [x]  Communication Skills []  Compliance Issues []  DBT []  Exploration of Coping Patterns [x]  Exploration of Emotions []  Exploration of Relationship Patterns []  Guided Imagery []  Interactive Feedback []  Interpersonal Resolutions []  Mindfulness Training []  Preventative Services [x]  Psycho-Education  []  Relaxation/Deep Breathing []  Review of Treatment Plan/Progress []  Role-Play/Behavioral Rehearsal  [x]  Structured Problem Solving [x]  Supportive Reflection [x]  Symptom Management  []  Other   Patient responded well to interventions. Patient continues to meet criteria for Adjustment disorder with mixed anxiety and depressed mood. Patient will continue to benefit from engagement in outpatient therapy due to being the least restrictive service to meet presenting needs.   Homework: None.  Plan: Return again in 2 weeks.  Diagnosis:  Encounter Diagnosis  Name Primary?   Adjustment disorder with mixed anxiety and depressed mood Yes    Collaboration of Care: Psychiatrist AEB provider documentation available in  EHR.  Patient/Guardian was advised Release of Information must be obtained prior to any record release in order to collaborate their care with an outside provider. Patient/Guardian was advised if they have not already done so to contact the registration department to sign all necessary forms in order for us  to release information regarding their care.   Consent: Patient/Guardian gives verbal consent for treatment and assignment of benefits for services provided during this visit. Patient/Guardian expressed understanding and agreed to proceed.   Virtual Visit via Video Note  I connected with Sabrina Brennan on 05/29/24 at 11:00 AM EDT by a video enabled telemedicine application and verified that I am speaking with the correct person using two identifiers.  Location: Patient: Home Provider: Home Office   I discussed the limitations of evaluation and management by telemedicine and the availability of in person appointments. The patient expressed understanding and agreed to proceed.  I discussed the assessment and treatment plan with the patient. The patient was provided an opportunity to ask questions and all were answered. The patient agreed with the plan and demonstrated an understanding of the instructions.   The patient was advised to call back or seek an in-person evaluation if the symptoms worsen or if the condition fails to improve as anticipated.  I provided 61 minutes of non-face-to-face time during this encounter.   Sabrina Brennan, MSW, LCSW 05/29/2024,  11:08 AM

## 2024-05-29 NOTE — Patient Instructions (Signed)
 Sabrina Brennan, thank you for joining [name], PA-C for today's virtual visit.  While this provider is not your primary care provider (PCP), if your PCP is located in our provider database this encounter information will be shared with them immediately following your visit.   A [facility] MyChart account gives you access to today's visit and all your visits, tests, and labs performed at [facility]  click here if you don't have a [facility] MyChart account or go to [email or web address]  Consent: (Patient) Sabrina Brennan provided verbal consent for this virtual visit at the beginning of the encounter.  Current Medications:  Current Outpatient Medications:    albuterol  (VENTOLIN  HFA) 108 (90 Base) MCG/ACT inhaler, Inhale 1-2 puffs into the lungs every 6 (six) hours as needed. (Patient not taking: Reported on 11/14/2023), Disp: 8 g, Rfl: 0   albuterol  (VENTOLIN  HFA) 108 (90 Base) MCG/ACT inhaler, Inhale 2 puffs into the lungs every 6 (six) hours as needed for wheezing or shortness of breath., Disp: 8 g, Rfl: 0   benzonatate  (TESSALON ) 100 MG capsule, Take 1-2 capsules (100-200 mg total) by mouth 3 (three) times daily as needed., Disp: 30 capsule, Rfl: 0   cetirizine  (ZYRTEC ) 10 MG tablet, Take 1 tablet (10 mg total) by mouth daily., Disp: 30 tablet, Rfl: 0   cyclobenzaprine  (FLEXERIL ) 5 MG tablet, Take 1 tablet (5 mg total) by mouth at bedtime as needed. (Patient not taking: Reported on 11/14/2023), Disp: 30 tablet, Rfl: 0   docusate sodium  (COLACE) 100 MG capsule, Take 1 capsule (100 mg total) by mouth every 12 (twelve) hours. (Patient not taking: Reported on 11/14/2023), Disp: 60 capsule, Rfl: 0   escitalopram  (LEXAPRO ) 10 MG tablet, Take 1 tablet (10 mg total) by mouth daily., Disp: 60 tablet, Rfl: 0   fluconazole  (DIFLUCAN ) 150 MG tablet, Take 1 tablet (150 mg total) by mouth every three (3) days as needed., Disp: 2 tablet, Rfl: 0   fluticasone  (FLONASE ) 50 MCG/ACT  nasal spray, Place 2 sprays into both nostrils daily., Disp: 16 g, Rfl: 0   HYDROcodone -acetaminophen  (NORCO/VICODIN) 5-325 MG tablet, Take 1 tablet by mouth every 4 (four) hours as needed for severe pain (pain score 7-10)., Disp: 5 tablet, Rfl: 0   hydrOXYzine  (ATARAX ) 10 MG tablet, Take 1 tablet (10 mg total) by mouth 3 (three) times daily as needed. May take 2 tablets ( 20mg  total) at night for sleep disturbance, Disp: 30 tablet, Rfl: 0   ketoconazole  (NIZORAL ) 2 % shampoo, Apply 1 Application topically 2 (two) times a week., Disp: 120 mL, Rfl: 0   ketoconazole  (NIZORAL ) 2 % shampoo, Apply 1 Application topically 2 (two) times a week., Disp: 120 mL, Rfl: 2   predniSONE  (DELTASONE ) 20 MG tablet, Take 2 tablets daily with breakfast., Disp: 10 tablet, Rfl: 0   promethazine -dextromethorphan  (PROMETHAZINE -DM) 6.25-15 MG/5ML syrup, Take 5 mLs by mouth 3 (three) times daily as needed for cough., Disp: 200 mL, Rfl: 0   propranolol  (INDERAL ) 10 MG tablet, Take 1 tablet (10 mg total) by mouth 3 (three) times daily., Disp: 10 tablet, Rfl: 0   scopolamine  (TRANSDERM-SCOP) 1 MG/3DAYS, Place 1 patch (1 mg total) onto the skin every 3 (three) days., Disp: 4 patch, Rfl: 0   triamcinolone cream (KENALOG) 0.1 %, Apply 1 Application topically 2 (two) times daily., Disp: 30 g, Rfl: 0   Medications ordered in this encounter:  No orders of the defined types were placed in this encounter.    *If you  need refills on other medications prior to your next appointment, please contact your pharmacy*  Follow-Up: Call back or seek an in-person evaluation if the symptoms worsen or if the condition fails to improve as anticipated.  [facility] [phone]  Other Instructions Please take antibiotic as directed.  Increase fluid intake.  Use Saline nasal spray.  Take a daily multivitamin. Resume your Floanse nasal spray. Ok to use Tylenol  OTC. Take the Ibuprofen  as directed, when needed.  Place a humidifier in  the bedroom.  Please call or return clinic if symptoms are not improving.  Sinusitis Sinusitis is redness, soreness, and swelling (inflammation) of the paranasal sinuses. Paranasal sinuses are air pockets within the bones of your face (beneath the eyes, the middle of the forehead, or above the eyes). In healthy paranasal sinuses, mucus is able to drain out, and air is able to circulate through them by way of your nose. However, when your paranasal sinuses are inflamed, mucus and air can become trapped. This can allow bacteria and other germs to grow and cause infection. Sinusitis can develop quickly and last only a short time (acute) or continue over a long period (chronic). Sinusitis that lasts for more than 12 weeks is considered chronic.  CAUSES  Causes of sinusitis include: Allergies. Structural abnormalities, such as displacement of the cartilage that separates your nostrils (deviated septum), which can decrease the air flow through your nose and sinuses and affect sinus drainage. Functional abnormalities, such as when the small hairs (cilia) that line your sinuses and help remove mucus do not work properly or are not present. SYMPTOMS  Symptoms of acute and chronic sinusitis are the same. The primary symptoms are pain and pressure around the affected sinuses. Other symptoms include: Upper toothache. Earache. Headache. Bad breath. Decreased sense of smell and taste. A cough, which worsens when you are lying flat. Fatigue. Fever. Thick drainage from your nose, which often is green and may contain pus (purulent). Swelling and warmth over the affected sinuses. DIAGNOSIS  Your caregiver will perform a physical exam. During the exam, your caregiver may: Look in your nose for signs of abnormal growths in your nostrils (nasal polyps). Tap over the affected sinus to check for signs of infection. View the inside of your sinuses (endoscopy) with a special imaging device with a light attached  (endoscope), which is inserted into your sinuses. If your caregiver suspects that you have chronic sinusitis, one or more of the following tests may be recommended: Allergy tests. Nasal culture A sample of mucus is taken from your nose and sent to a lab and screened for bacteria. Nasal cytology A sample of mucus is taken from your nose and examined by your caregiver to determine if your sinusitis is related to an allergy. TREATMENT  Most cases of acute sinusitis are related to a viral infection and will resolve on their own within 10 days. Sometimes medicines are prescribed to help relieve symptoms (pain medicine, decongestants, nasal steroid sprays, or saline sprays).  However, for sinusitis related to a bacterial infection, your caregiver will prescribe antibiotic medicines. These are medicines that will help kill the bacteria causing the infection.  Rarely, sinusitis is caused by a fungal infection. In theses cases, your caregiver will prescribe antifungal medicine. For some cases of chronic sinusitis, surgery is needed. Generally, these are cases in which sinusitis recurs more than 3 times per year, despite other treatments. HOME CARE INSTRUCTIONS  Drink plenty of water. Water helps thin the mucus so  your sinuses can drain more easily. Use a humidifier. Inhale steam 3 to 4 times a day (for example, sit in the bathroom with the shower running). Apply a warm, moist washcloth to your face 3 to 4 times a day, or as directed by your caregiver. Use saline nasal sprays to help moisten and clean your sinuses. Take over-the-counter or prescription medicines for pain, discomfort, or fever only as directed by your caregiver. SEEK IMMEDIATE MEDICAL CARE IF: You have increasing pain or severe headaches. You have nausea, vomiting, or drowsiness. You have swelling around your face. You have vision problems. You have a stiff neck. You have difficulty breathing. MAKE SURE YOU:  Understand these  instructions. Will watch your condition. Will get help right away if you are not doing well or get worse. Document Released: 07/19/2005 Document Revised: 10/11/2011 Document Reviewed: 08/03/2011 [facility] Patient Information 2014 [place], [place].    If you have been instructed to have an in-person evaluation today at a local Urgent Care facility, please use the link below. It will take you to a list of all of our available [facility], including address, phone number and hours of operation. Please do not delay care.  [facility]  If you or a family member do not have a primary care provider, use the link below to schedule a visit and establish care. When you choose a [facility] primary care physician or advanced practice provider, you gain a long-term partner in health. Find a Primary Care Provider  Learn more about [facility] in-office and virtual care options: [facility] - Get Care Now

## 2024-05-29 NOTE — Progress Notes (Signed)
 Virtual Visit Consent   Sabrina Brennan Public, you are scheduled for a virtual visit with a [facility] provider today. Just as with appointments in the office, your consent must be obtained to participate. Your consent will be active for this visit and any virtual visit you may have with one of our providers in the next 365 days. If you have a MyChart account, a copy of this consent can be sent to you electronically.  As this is a virtual visit, video technology does not allow for your provider to perform a traditional examination. This may limit your provider's ability to fully assess your condition. If your provider identifies any concerns that need to be evaluated in person or the need to arrange testing (such as labs, EKG, etc.), we will make arrangements to do so. Although advances in technology are sophisticated, we cannot ensure that it will always work on either your end or our end. If the connection with a video visit is poor, the visit may have to be switched to a telephone visit. With either a video or telephone visit, we are not always able to ensure that we have a secure connection.  By engaging in this virtual visit, you consent to the provision of healthcare and authorize for your insurance to be billed (if applicable) for the services provided during this visit. Depending on your insurance coverage, you may receive a charge related to this service.  I need to obtain your verbal consent now. Are you willing to proceed with your visit today? Sabrina Brennan has provided verbal consent on 05/29/2024 for a virtual visit (video or telephone). Sabrina Brennan, [place]  Date: 05/29/2024 10:50 AM   Virtual Visit via Video Note   I, Sabrina Brennan, connected with  KARIANN WECKER  ([id number], 1991/01/29) on 05/29/24 at 10:45 AM EDT by a video-enabled telemedicine application and verified that I am speaking with the correct person using two identifiers.  Location: Patient: Virtual Visit  Location Patient: Home Provider: Virtual Visit Location Provider: Home Office   I discussed the limitations of evaluation and management by telemedicine and the availability of in person appointments. The patient expressed understanding and agreed to proceed.    History of Present Illness: Sabrina Brennan is a 33 y.o. who identifies as a female who was assigned female at birth, and is being seen today for progressively worsening nasal congestion and sinus pressure, now with sinus/facial pain, chills, sweats over the past few days. Some associated chest congestion and cough but milder. Has been taking Mucinex  and Ibuprofen . Has substantial history of sinusitis with previous recommendation for sinus surgery but has not had follow-up with ENT. Denies recent travel or known sick contact.  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   History of abnormal cervical Pap smear 08/28/2020   High grade squamous intraepithelial lesion (HGSIL), grade 3 CIN, 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    Allergies: No Known Allergies Medications:  Current Outpatient Medications:    amoxicillin -clavulanate (AUGMENTIN ) 875-125 MG tablet, Take 1 tablet by mouth 2 (two) times daily., Disp: 14 tablet, Rfl: 0   ibuprofen  (ADVIL ) 600 MG tablet, Take 1 tablet (600 mg total) by mouth every 8 (eight) hours  as needed., Disp: 30 tablet, Rfl: 0   albuterol  (VENTOLIN  HFA) 108 (90 Base) MCG/ACT inhaler, Inhale 1-2 puffs into the lungs every 6 (six) hours as needed. (Patient not taking: Reported on 11/14/2023), Disp: 8 g, Rfl: 0   albuterol  (VENTOLIN  HFA) 108 (90 Base) MCG/ACT inhaler, Inhale 2 puffs into the lungs every 6 (six) hours as  needed for wheezing or shortness of breath., Disp: 8 g, Rfl: 0   benzonatate  (TESSALON ) 100 MG capsule, Take 1-2 capsules (100-200 mg total) by mouth 3 (three) times daily as needed., Disp: 30 capsule, Rfl: 0   cetirizine  (ZYRTEC ) 10 MG tablet, Take 1 tablet (10 mg total) by mouth daily., Disp: 30 tablet, Rfl: 0   cyclobenzaprine  (FLEXERIL ) 5 MG tablet, Take 1 tablet (5 mg total) by mouth at bedtime as needed. (Patient not taking: Reported on 11/14/2023), Disp: 30 tablet, Rfl: 0   docusate sodium  (COLACE) 100 MG capsule, Take 1 capsule (100 mg total) by mouth every 12 (twelve) hours. (Patient not taking: Reported on 11/14/2023), Disp: 60 capsule, Rfl: 0   escitalopram  (LEXAPRO ) 10 MG tablet, Take 1 tablet (10 mg total) by mouth daily., Disp: 60 tablet, Rfl: 0   fluconazole  (DIFLUCAN ) 150 MG tablet, Take 1 tablet (150 mg total) by mouth every three (3) days as needed., Disp: 2 tablet, Rfl: 0   fluticasone  (FLONASE ) 50 MCG/ACT nasal spray, Place 2 sprays into both nostrils daily., Disp: 16 g, Rfl: 0   HYDROcodone -acetaminophen  (NORCO/VICODIN) 5-325 MG tablet, Take 1 tablet by mouth every 4 (four) hours as needed for severe pain (pain score 7-10)., Disp: 5 tablet, Rfl: 0   hydrOXYzine  (ATARAX ) 10 MG tablet, Take 1 tablet (10 mg total) by mouth 3 (three) times daily as needed. May take 2 tablets ( 20mg  total) at night for sleep disturbance, Disp: 30 tablet, Rfl: 0   ketoconazole  (NIZORAL ) 2 % shampoo, Apply 1 Application topically 2 (two) times a week., Disp: 120 mL, Rfl: 0   ketoconazole  (NIZORAL ) 2 % shampoo, Apply 1 Application topically 2 (two) times a week., Disp: 120 mL, Rfl: 2   promethazine -dextromethorphan  (PROMETHAZINE -DM) 6.25-15 MG/5ML syrup, Take 5 mLs by mouth 3 (three) times daily as needed for cough., Disp: 200 mL, Rfl: 0   propranolol  (INDERAL ) 10 MG tablet, Take 1 tablet (10 mg total) by mouth 3 (three) times daily., Disp: 10 tablet, Rfl: 0   scopolamine  (TRANSDERM-SCOP) 1 MG/3DAYS, Place 1  patch (1 mg total) onto the skin every 3 (three) days., Disp: 4 patch, Rfl: 0   triamcinolone cream (KENALOG) 0.1 %, Apply 1 Application topically 2 (two) times daily., Disp: 30 g, Rfl: 0  Observations/Objective: Patient is well-developed, well-nourished in no acute distress.  Resting comfortably  at home.  Head is normocephalic, atraumatic.  No labored breathing.  Speech is clear and coherent with logical content.  Patient is alert and oriented at baseline.   Assessment and Plan: 1. Acute bacterial sinusitis (Primary) - amoxicillin -clavulanate (AUGMENTIN ) 875-125 MG tablet; Take 1 tablet by mouth 2 (two) times daily.  Dispense: 14 tablet; Refill: 0 - ibuprofen  (ADVIL ) 600 MG tablet; Take 1 tablet (600 mg total) by mouth every 8 (eight) hours as needed.  Dispense: 30 tablet; Refill: 0  Rx Augmentin .  Increase fluids.  Rest.  Saline nasal spray.  Probiotic.  Mucinex  as directed.  Humidifier in bedroom. Ibuprofen  per orders. Resume Flonase  nasal spray.  Call or return to clinic if symptoms are not improving.   Follow Up Instructions: I discussed the assessment  and treatment plan with the patient. The patient was provided an opportunity to ask questions and all were answered. The patient agreed with the plan and demonstrated an understanding of the instructions.  A copy of instructions were sent to the patient via MyChart unless otherwise noted below.   The patient was advised to call back or seek an in-person evaluation if the symptoms worsen or if the condition fails to improve as anticipated.    Sabrina Velma Lunger, PA-C

## 2024-06-12 ENCOUNTER — Telehealth (HOSPITAL_COMMUNITY): Admitting: Family

## 2024-06-12 DIAGNOSIS — F411 Generalized anxiety disorder: Secondary | ICD-10-CM

## 2024-06-12 DIAGNOSIS — F331 Major depressive disorder, recurrent, moderate: Secondary | ICD-10-CM | POA: Diagnosis not present

## 2024-06-12 MED ORDER — HYDROXYZINE HCL 10 MG PO TABS: 10.0000 mg | ORAL_TABLET | Freq: Three times a day (TID) | ORAL | 2 refills | Status: AC | PRN

## 2024-06-12 MED ORDER — ESCITALOPRAM OXALATE 20 MG PO TABS: 20.0000 mg | ORAL_TABLET | Freq: Every day | ORAL | 2 refills | Status: AC

## 2024-06-12 MED ORDER — PROPRANOLOL HCL 10 MG PO TABS: 10.0000 mg | ORAL_TABLET | Freq: Three times a day (TID) | ORAL | 0 refills | Status: AC

## 2024-06-12 NOTE — Progress Notes (Addendum)
 Virtual Visit via Video Note  I connected with Sabrina Brennan on 06/12/24 at  1:00 PM EST by a video enabled telemedicine application and verified that I am speaking with the correct person using two identifiers.  Location: Patient: Home Provider: Office   I discussed the limitations of evaluation and management by telemedicine and the availability of in person appointments. The patient expressed understanding and agreed to proceed.  I discussed the assessment and treatment plan with the patient. The patient was provided an opportunity to ask questions and all were answered. The patient agreed with the plan and demonstrated an understanding of the instructions.   The patient was advised to call back or seek an in-person evaluation if the symptoms worsen or if the condition fails to improve as anticipated.  I provided 18 minutes of non-face-to-face time during this encounter.   Staci LOISE Kerns, NP   [facility] MD/PA/NP OP Progress Note  06/12/2024 1:17 PM Sabrina Brennan  MRN:  [id number]  Chief Complaint: Medication management  HPI: Sabrina Brennan 33 year old female was seen and evaluated via virtual platform caregility.  She reports she has been struggling with heightened anxiety which she attributes to work-related stressors.  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  Reports limited support by staff during emergent situations.  She reports she felt Lexapro  was making her too sleepy throughout the day so she discontinued medication abruptly 2 weeks prior.  States she recently restarted medication as she has been on Lexapro  10 mg since her last therapist appointment.  Reports heightened anxiety and mild depression symptoms.  Discussed titrating Lexapro  10 mg to 20 mg daily.  Discussed taking medication nightly and making Inderal  to help with lingering anxiety symptoms.   Sabrina Brennan stated she knew that she needed to restart medication as the holidays  are coming around.  Patient anticipates some stressors related to family gatherings for Thanksgiving.  She reports her significant other has been supportive during this time.  She reports a fair appetite.  States she is resting well throughout the night.  3 months for medication follow-up appointment.  Keep all appointments with therapist.  Currently mood is congruent.  Presents with a bright and pleasant affect.  No concerns related to suicidal or homicidal ideations.  Patient to continue to monitor symptoms.  Follow-up earlier if needed.  Support encouragement reassurance was provided.   Visit Diagnosis:    ICD-10-CM   1. GAD (generalized anxiety disorder)  F41.1     2. Moderate episode of recurrent major depressive disorder (HCC)  F33.1       Past Psychiatric History: Sabrina Brennan denies previous inpatient admissions.  Denies previous suicide attempts.  Denies history related to substance abuse.  Denies that she is ever been prescribed any psychotropic medications.  Currently prescribed hydroxyzine  Inderal  and Lexapro   Past Medical History:  Past Medical History:  Diagnosis Date   Diarrhea 09/24/2022   Medical history non-contributory    Migraine     Past Surgical History:  Procedure Laterality Date   EXCISION MASS LOWER EXTREMETIES Left 12/11/2019   Procedure: EXCISION LEFT THIGH CYST;  Surgeon: Vanderbilt Ned, MD;  Location:  SURGERY CENTER;  Service: General;  Laterality: Left;   LIPOSUCTION MULTIPLE BODY PARTS      Family Psychiatric History:   Family History:  Family History  Problem Relation Age of Onset   Healthy Mother    Healthy Father    Breast cancer Maternal Grandmother    Hypertension  Maternal Grandmother    Pancreatic cancer Maternal Grandfather    Migraines Maternal Grandfather    Breast cancer Other        Maternal aunt    Social History:  Social History   Socioeconomic History   Marital status: Single    Spouse name: Not on file   Number of  children: Not on file   Years of education: Not on file   Highest education level: Bachelor's degree (e.g., BA, AB, BS)  Occupational History   Not on file  Tobacco Use   Smoking status: Never    Passive exposure: Never   Smokeless tobacco: Never   Tobacco comments:    Hooka 2x monthly  Vaping Use   Vaping status: Never Used  Substance and Sexual Activity   Alcohol use: Yes    Comment: occ   Drug use: No   Sexual activity: Yes    Birth control/protection: Injection  Other Topics Concern   Not on file  Social History Narrative   Lives with her mom   Right handed   Caffeine : 2 liter pepsi/day      Patient reports she is under family stress. Will be seeking help from counselor.    Social Drivers of Corporate Investment Banker Strain: Low Risk  (07/28/2023)   Overall Financial Resource Strain (CARDIA)    Difficulty of Paying Living Expenses: Not very hard  Food Insecurity: No Food Insecurity (07/28/2023)   Hunger Vital Sign    Worried About Running Out of Food in the Last Year: Never true    Ran Out of Food in the Last Year: Never true  Transportation Needs: No Transportation Needs (07/28/2023)   PRAPARE - Administrator, Civil Service (Medical): No    Lack of Transportation (Non-Medical): No  Physical Activity: Unknown (07/28/2023)   Exercise Vital Sign    Days of Exercise per Week: 0 days    Minutes of Exercise per Session: Not on file  Stress: Stress Concern Present (07/28/2023)   [employer] of Occupational Health - Occupational Stress Questionnaire    Feeling of Stress : Very much  Social Connections: Socially Isolated (07/28/2023)   Social Connection and Isolation Panel    Frequency of Communication with Friends and Family: More than three times a week    Frequency of Social Gatherings with Friends and Family: Twice a week    Attends Religious Services: Never    Database Administrator or Organizations: No    Attends Hospital Doctor: Not on file    Marital Status: Never married    Allergies: No Known Allergies  Metabolic Disorder Labs: Lab Results  Component Value Date   HGBA1C 5.3 04/30/2022   No results found for: PROLACTIN No results found for: CHOL, TRIG, HDL, CHOLHDL, VLDL, LDLCALC Lab Results  Component Value Date   TSH 0.764 05/14/2022   TSH 0.348 (L) 04/30/2022    Therapeutic Level Labs: No results found for: LITHIUM No results found for: VALPROATE No results found for: CBMZ  Current Medications: Current Outpatient Medications  Medication Sig Dispense Refill   escitalopram  (LEXAPRO ) 20 MG tablet Take 1 tablet (20 mg total) by mouth daily. 30 tablet 2   albuterol  (VENTOLIN  HFA) 108 (90 Base) MCG/ACT inhaler Inhale 1-2 puffs into the lungs every 6 (six) hours as needed. (Patient not taking: Reported on 11/14/2023) 8 g 0   albuterol  (VENTOLIN  HFA) 108 (90 Base) MCG/ACT inhaler Inhale 2 puffs into the lungs every  6 (six) hours as needed for wheezing or shortness of breath. 8 g 0   amoxicillin -clavulanate (AUGMENTIN ) 875-125 MG tablet Take 1 tablet by mouth 2 (two) times daily. 14 tablet 0   benzonatate  (TESSALON ) 100 MG capsule Take 1-2 capsules (100-200 mg total) by mouth 3 (three) times daily as needed. 30 capsule 0   cetirizine  (ZYRTEC ) 10 MG tablet Take 1 tablet (10 mg total) by mouth daily. 30 tablet 0   cyclobenzaprine  (FLEXERIL ) 5 MG tablet Take 1 tablet (5 mg total) by mouth at bedtime as needed. (Patient not taking: Reported on 11/14/2023) 30 tablet 0   docusate sodium  (COLACE) 100 MG capsule Take 1 capsule (100 mg total) by mouth every 12 (twelve) hours. (Patient not taking: Reported on 11/14/2023) 60 capsule 0   fluconazole  (DIFLUCAN ) 150 MG tablet Take 1 tablet (150 mg total) by mouth every three (3) days as needed. 2 tablet 0   fluticasone  (FLONASE ) 50 MCG/ACT nasal spray Place 2 sprays into both nostrils daily. 16 g 0   HYDROcodone -acetaminophen  (NORCO/VICODIN)  5-325 MG tablet Take 1 tablet by mouth every 4 (four) hours as needed for severe pain (pain score 7-10). 5 tablet 0   hydrOXYzine  (ATARAX ) 10 MG tablet Take 1 tablet (10 mg total) by mouth 3 (three) times daily as needed. May take 2 tablets ( 20mg  total) at night for sleep disturbance 30 tablet 0   ibuprofen  (ADVIL ) 600 MG tablet Take 1 tablet (600 mg total) by mouth every 8 (eight) hours as needed. 30 tablet 0   ketoconazole  (NIZORAL ) 2 % shampoo Apply 1 Application topically 2 (two) times a week. 120 mL 0   ketoconazole  (NIZORAL ) 2 % shampoo Apply 1 Application topically 2 (two) times a week. 120 mL 2   promethazine -dextromethorphan  (PROMETHAZINE -DM) 6.25-15 MG/5ML syrup Take 5 mLs by mouth 3 (three) times daily as needed for cough. 200 mL 0   propranolol  (INDERAL ) 10 MG tablet Take 1 tablet (10 mg total) by mouth 3 (three) times daily. 10 tablet 0   scopolamine  (TRANSDERM-SCOP) 1 MG/3DAYS Place 1 patch (1 mg total) onto the skin every 3 (three) days. 4 patch 0   triamcinolone cream (KENALOG) 0.1 % Apply 1 Application topically 2 (two) times daily. 30 g 0   No current facility-administered medications for this visit.     Musculoskeletal: Virtual platform  Psychiatric Specialty Exam: Review of Systems  There were no vitals taken for this visit.There is no height or weight on file to calculate BMI.  General Appearance: Casual  Eye Contact:  Good  Speech:  Clear and Coherent  Volume:  Normal  Mood:  Anxious and Depressed  Affect:  Congruent  Thought Process:  Coherent  Orientation:  Full (Time, Place, and Person)  Thought Content: Logical   Suicidal Thoughts:  No  Homicidal Thoughts:  No  Memory:  Immediate;   Fair Recent;   Fair  Judgement:  Fair  Insight:  Good  Psychomotor Activity:  Normal  Concentration:  Concentration: Good  Recall:  Good  Fund of Knowledge: Good  Language: Good  Akathisia:  No  Handed:  Right  AIMS (if indicated): not done  Assets:  Communication  Skills Desire for Improvement  ADL's:  Intact  Cognition: WNL  Sleep:  Good   Screenings: AUDIT    Flowsheet Row Appointment from 08/01/2023 in [facility] [place] Appointment from 10/25/2022 in [facility] - [facility] Of  [place]  Alcohol Use Disorder Identification Test Final Score (AUDIT) 14  3   GAD-7    Flowsheet Row Counselor from 03/15/2024 in [facility] at [facility] from 02/21/2024 in [facility] at [facility] from 01/31/2024 in [facility] at [facility] from 01/19/2024 in [facility] Behavior[facility] Health at [facility] from 12/19/2023 in [facility]patient Behavior[facility] Health at [facility]  Total GAD-7 Score 6 2 5 10 6    PHQ2-9    Flowsheet Row Counselor from 03/15/2024 in [place] [facility] at [facility] from 02/21/2024 in [facility]patient Behavioral Health at [facility] from 01/31/2024 in [place] [facility] at [facility] from 01/19/2024 in [facility] Out[facility] Health at [facility] from 12/19/2023 in [facility] at [facility] Total Score 2 0 2 1 2   PHQ-9 Total Score 4 3 5 8 7    Flowsheet Row ED from 02/08/2024 in [facility] at [facility] UC from 11/21/2023 in [facility] at [facility] UC from 07/14/2023 in [facility] at [facility])  C-SSRS RISK CATEGORY No Risk No Risk No Risk     Assessment and Plan: Sabrina Brennan 33 year old African-American female presents for medication management follow-up appointment.  Carries a diagnosis related to major depressive disorder, generalized anxiety disorder and adjustment disorder.  Currently prescribed  Lexapro , Inderal  and hydroxyzine .  Discussed increasing Lexapro  from 10 mg to Lexapro  20 mg.  Will make Inderal /hydroxyzine  available for breakthrough anxiety symptoms.  Discussed taking medication at night due to reported sedation with Lexapro .  Patient to follow-up 3 months for medication management appointment.  Support, encouragement and reassurance  Collaboration of Care: Collaboration of Care: Medication Management AEB increase Lexapro  10 mg to Lexapro  20 mg refills provided with Inderal  and hydroxyzine   Patient/Guardian was advised Release of Information must be obtained prior to any record release in order to collaborate their care with an outside provider. Patient/Guardian was advised if they have not already done so to contact the registration department to sign all necessary forms in order for us  to release information regarding their care.   Consent: Patient/Guardian gives verbal consent for treatment and assignment of benefits for services provided during this visit. Patient/Guardian expressed understanding and agreed to proceed.    Staci LOISE Kerns, NP 06/12/2024, 1:17 PM

## 2024-06-14 ENCOUNTER — Ambulatory Visit (HOSPITAL_COMMUNITY): Admitting: Licensed Clinical Social Worker

## 2024-06-20 ENCOUNTER — Encounter (HOSPITAL_COMMUNITY): Payer: Self-pay

## 2024-06-27 ENCOUNTER — Ambulatory Visit (HOSPITAL_COMMUNITY): Admitting: Licensed Clinical Social Worker

## 2024-06-27 DIAGNOSIS — F331 Major depressive disorder, recurrent, moderate: Secondary | ICD-10-CM

## 2024-06-27 DIAGNOSIS — F411 Generalized anxiety disorder: Secondary | ICD-10-CM

## 2024-06-27 NOTE — Progress Notes (Signed)
 THERAPIST PROGRESS NOTE   Session Date: 06/27/2024  Session Time: 1408 - 1510  Participation Level: Active  Behavioral Response: CasualAlertEuthymic and Irritable  Type of Therapy: Individual Therapy  Treatment Goals addressed:   Progressing (4) LTG: Sabrina Brennan will reduce the amount of anger-related incidents/outbursts by 50% as evidenced by self-report (Anger Management) LTG: Reduce frequency, intensity, and duration of depression symptoms so that daily functioning is improved (OP Depression) LTG: Increase coping skills to manage depression and improve ability to perform daily activities (OP Depression) LTG: Try to improve management of moods, and how I project them (OP Depression)  Completed/Met (1) STG: Sabrina Brennan will identify situations, thoughts, and feelings that trigger internal anger, and/or angry/aggressive actions as evidenced by self-report (Anger Management)  Progress Towards Goals: Progressing  Interventions: CBT, Motivational Interviewing, Solution Focused, and Supportive  Summary: Sabrina Brennan is a 33 y.o. female with past psych history of GAD and MDD, presenting for follow-up therapy session in efforts to improve management of presenting depressive and anxious symptoms.  Patient actively engaged in session, presenting with pleasant mood and congruent affect throughout the visit.  Patient openly participated in the introductory check-in, sharing that she was currently at her boyfriend's home awaiting technicians to repair a household appliance. Patient acknowledged referring to him as her "boyfriend," and explored relationship developments over the past month since the previous session. Patient described feeling increasingly supported, cared for, encouraged, and experiencing heightened security and safety within the relationship.  Patient reported receiving text messages from her ex-partner approximately three times over the past month. She noted the first message occurred the  week following her last session, attributing it to her ex seeing increased social media activity and a posted photo of her being on a date. Patient shared that additional messages were sent over the past month, all of which she has chosen not to respond to. Engaged in processing differences between current and past relationships, highlighting increased stress and emotional strain in prior relationship dynamics versus the enhanced support, comfort, and emotional stability experienced in her current relationship.  Patient also engaged in a six-month review of individualized treatment goals outlined in her treatment plan. Explored progress made across the past six months, including gains in emotional regulation, relationship stability, boundary setting, and coping strategies. Discussed ongoing areas for therapeutic focus and goals for continued work moving forward.     03/15/2024   10:40 AM 02/21/2024   11:12 AM 01/31/2024    1:10 PM 01/19/2024    4:10 PM  GAD 7 : Generalized Anxiety Score  Nervous, Anxious, on Edge 2 1 1  0  Control/stop worrying 1 0 0 2  Worry too much - different things 0 0 0 2  Trouble relaxing 1 0 1 2  Restless 0 0 1 0  Easily annoyed or irritable 2 1 2 3   Afraid - awful might happen 0 0 0 1  Total GAD 7 Score 6 2 5 10   Anxiety Difficulty Very difficult Somewhat difficult Somewhat difficult Somewhat difficult      03/15/2024   10:42 AM 02/21/2024   11:14 AM 01/31/2024    1:12 PM 01/19/2024    4:13 PM 12/19/2023    4:29 PM  Depression screen PHQ 2/9  Decreased Interest 1 0 1 1 1   Down, Depressed, Hopeless 1 0 1 0 1  PHQ - 2 Score 2 0 2 1 2   Altered sleeping 1 1 0 2 0  Tired, decreased energy 0 1 0 1 2  Change in appetite 0  1 2 2 2   Feeling bad or failure about yourself  0 0 0 0 0  Trouble concentrating 1 0 1 2 1   Moving slowly or fidgety/restless 0 0 0 0 0  Suicidal thoughts 0 0 0 0 0  PHQ-9 Score 4  3  5  8  7    Difficult doing work/chores Somewhat difficult Somewhat  difficult Somewhat difficult Very difficult Somewhat difficult     Data saved with a previous flowsheet row definition    Suicidal/Homicidal: Nowithout intent/plan  Therapist Response:  Clinician openly greeted patient upon joining today's visit, assessing presenting mood and affect, engaging in introductory check-in, exploring daily events and presenting moods. Utilized open ended questions in eliciting pt's recounts of events of the past month, exploring presence of newly presenting stressors, ongoing areas of difficulty in relation to profession and work environment, observed implications on moods, and individual improvements in navigating difficulties.  Utilized active listening techniques to support pt's recounts of event, providing support and validation of identified thoughts and perspectives in relation to challenges and stressors, further supporting in processing rationale of thoughts and perspectives, and exploration of pt's response to stress. CBT strategies, psychoeducation, Motivational Interviewing (MI), and supportive reflection techniques were utilized to process current and past relational stressors, explore patterns of emotional growth, and reinforce patient's coping and decision-making skills. Clinician reassessed severity of presenting sxs, and presence of any safety concerns.  [x]  Cognitive Challenging []  Cognitive Refocusing [x]  Cognitive Reframing  [x]  Communication Skills []  Compliance Issues []  DBT []  Exploration of Coping Patterns [x]  Exploration of Emotions [x]  Exploration of Relationship Patterns []  Guided Imagery []  Interactive Feedback []  Interpersonal Resolutions []  Mindfulness Training []  Preventative Services [x]  Psycho-Education  []  Relaxation/Deep Breathing [x]  Review of Treatment Plan/Progress []  Role-Play/Behavioral Rehearsal  [x]  Structured Problem Solving [x]  Supportive Reflection [x]  Symptom Management  []  Other   Patient responded well to interventions.  Patient continues to meet criteria for GAD and MDD. Patient will continue to benefit from engagement in outpatient therapy due to being the least restrictive service to meet presenting needs.   Homework: None.  Plan: Return again in 2 weeks.  Diagnosis:  Encounter Diagnoses  Name Primary?   GAD (generalized anxiety disorder) Yes   Moderate episode of recurrent major depressive disorder (HCC)     Collaboration of Care: Psychiatrist AEB provider documentation available in EHR.  Patient/Guardian was advised Release of Information must be obtained prior to any record release in order to collaborate their care with an outside provider. Patient/Guardian was advised if they have not already done so to contact the registration department to sign all necessary forms in order for us  to release information regarding their care.   Consent: Patient/Guardian gives verbal consent for treatment and assignment of benefits for services provided during this visit. Patient/Guardian expressed understanding and agreed to proceed.   Virtual Visit via Video Note  I connected with Sabrina Brennan on 06/27/24 at  2:00 PM EST by a video enabled telemedicine application and verified that I am speaking with the correct person using two identifiers.  Location: Patient: Boyfriend's home Provider: Home Office   I discussed the limitations of evaluation and management by telemedicine and the availability of in person appointments. The patient expressed understanding and agreed to proceed.  I discussed the assessment and treatment plan with the patient. The patient was provided an opportunity to ask questions and all were answered. The patient agreed with the plan and demonstrated an understanding of the instructions.  The patient was advised to call back or seek an in-person evaluation if the symptoms worsen or if the condition fails to improve as anticipated.  I provided 62 minutes of non-face-to-face time during  this encounter.   Sabrina Brennan, MSW, LCSW 06/27/2024,  2:15 PM

## 2024-07-03 ENCOUNTER — Ambulatory Visit

## 2024-07-03 DIAGNOSIS — Z3042 Encounter for surveillance of injectable contraceptive: Secondary | ICD-10-CM

## 2024-07-03 MED ORDER — MEDROXYPROGESTERONE ACETATE 150 MG/ML IM SUSP
150.0000 mg | Freq: Once | INTRAMUSCULAR | Status: AC
  Administered 2024-07-03: 150 mg via INTRAMUSCULAR

## 2024-07-03 NOTE — Progress Notes (Signed)
 Patient here today for Depo Provera  injection and is within her dates.    Last contraceptive appt was 08/22/2023  Depo given in [facility] today.  Site unremarkable & patient tolerated injection.    Next injection due 09/18/24-10/02/24.  Reminder card given.    *Advised patient that she will need to schedule her next visit with PCP for annual contraceptive exam. Patient voices understanding.   Chiquita JAYSON English, RN

## 2024-07-10 ENCOUNTER — Ambulatory Visit (HOSPITAL_COMMUNITY): Admitting: Licensed Clinical Social Worker

## 2024-07-10 DIAGNOSIS — F331 Major depressive disorder, recurrent, moderate: Secondary | ICD-10-CM

## 2024-07-10 DIAGNOSIS — F411 Generalized anxiety disorder: Secondary | ICD-10-CM

## 2024-07-10 NOTE — Progress Notes (Unsigned)
 THERAPIST PROGRESS NOTE   Session Date: 07/10/2024  Session Time: 1611 - 1708  Participation Level: Active  Behavioral Response: CasualAlertAnxious, Depressed, and Irritable  Type of Therapy: Individual Therapy  Treatment Goals addressed:   Progressing (4) LTG: Weltha will reduce the amount of anger-related incidents/outbursts by 50% as evidenced by self-report (Anger Management) LTG: Reduce frequency, intensity, and duration of depression symptoms so that daily functioning is improved (OP Depression) LTG: Increase coping skills to manage depression and improve ability to perform daily activities (OP Depression) LTG: Try to improve management of moods, and how I project them (OP Depression)  Completed/Met (1) STG: Cynitha will identify situations, thoughts, and feelings that trigger internal anger, and/or angry/aggressive actions as evidenced by self-report (Anger Management)  Progress Towards Goals: Progressing  Interventions: CBT, Motivational Interviewing, Solution Focused, and Supportive  Summary: Irelyn is a 33 y.o. female with past psych history of GAD and MDD, presenting for follow-up therapy session in efforts to improve management of presenting depressive and anxious symptoms.  Patient actively engaged in session, presenting with pleasant mood and congruent affect throughout the visit.  Patient openly participated in the introductory check-in, sharing of being at home, having just booked flight to [place] for Christmas to see family, specifically grandparents.  Pt shared of having had a hard day, sharing of things proving to be having increased in difficulty since Thanksgiving, sharing further of having spent the night at mother's on Thanksgiving night, and family dog having climbed onto bed with pt, presuming dog having sensed pt's mood. Pt further shared of having decided with mother for pt to keep dog at her home as means of support, noting of the dog helping pt cope with  challenges.  Pt shared of having experienced increased challenges in relationship with boyfriend over the past two days, *** Noted of red flags, boyfriend slept on couch and didn't engage this a.m. before leaving for work, feeling to be iced out, increased anxiousness and uncertainty surrounding what has done, etc. Processed potential for having gotten into relationship too quickly, moving too fast       03/15/2024   10:40 AM 02/21/2024   11:12 AM 01/31/2024    1:10 PM 01/19/2024    4:10 PM  GAD 7 : Generalized Anxiety Score  Nervous, Anxious, on Edge 2 1 1  0  Control/stop worrying 1 0 0 2  Worry too much - different things 0 0 0 2  Trouble relaxing 1 0 1 2  Restless 0 0 1 0  Easily annoyed or irritable 2 1 2 3   Afraid - awful might happen 0 0 0 1  Total GAD 7 Score 6 2 5 10   Anxiety Difficulty Very difficult Somewhat difficult Somewhat difficult Somewhat difficult      03/15/2024   10:42 AM 02/21/2024   11:14 AM 01/31/2024    1:12 PM 01/19/2024    4:13 PM 12/19/2023    4:29 PM  Depression screen PHQ 2/9  Decreased Interest 1 0 1 1 1   Down, Depressed, Hopeless 1 0 1 0 1  PHQ - 2 Score 2 0 2 1 2   Altered sleeping 1 1 0 2 0  Tired, decreased energy 0 1 0 1 2  Change in appetite 0 1 2 2 2   Feeling bad or failure about yourself  0 0 0 0 0  Trouble concentrating 1 0 1 2 1   Moving slowly or fidgety/restless 0 0 0 0 0  Suicidal thoughts 0 0 0 0 0  PHQ-9  Score 4  3  5  8  7    Difficult doing work/chores Somewhat difficult Somewhat difficult Somewhat difficult Very difficult Somewhat difficult     Data saved with a previous flowsheet row definition    Suicidal/Homicidal: Nowithout intent/plan  Therapist Response:  Clinician openly greeted patient upon joining today's visit, assessing presenting mood and affect, engaging in introductory check-in, exploring daily events and presenting moods. Utilized open ended questions in eliciting pt's recounts of events of the past month,  exploring presence of newly presenting stressors, ongoing areas of difficulty in relation to profession and work environment, observed implications on moods, and individual improvements in navigating difficulties.  Utilized active listening techniques to support pt's recounts of event, providing support and validation of identified thoughts and perspectives in relation to challenges and stressors, further supporting in processing rationale of thoughts and perspectives, and exploration of pt's response to stress. CBT strategies, psychoeducation, Motivational Interviewing (MI), and supportive reflection techniques were utilized to process current and past relational stressors, explore patterns of emotional growth, and reinforce patient's coping and decision-making skills. Clinician reassessed severity of presenting sxs, and presence of any safety concerns.  [x]  Cognitive Challenging []  Cognitive Refocusing [x]  Cognitive Reframing  [x]  Communication Skills []  Compliance Issues []  DBT []  Exploration of Coping Patterns [x]  Exploration of Emotions [x]  Exploration of Relationship Patterns []  Guided Imagery []  Interactive Feedback []  Interpersonal Resolutions []  Mindfulness Training []  Preventative Services [x]  Psycho-Education  []  Relaxation/Deep Breathing [x]  Review of Treatment Plan/Progress []  Role-Play/Behavioral Rehearsal  [x]  Structured Problem Solving [x]  Supportive Reflection [x]  Symptom Management  []  Other   Patient responded well to interventions. Patient continues to meet criteria for GAD and MDD. Patient will continue to benefit from engagement in outpatient therapy due to being the least restrictive service to meet presenting needs.   Homework: None.  Plan: Return again in 2 weeks.  Diagnosis:  Encounter Diagnosis  Name Primary?   GAD (generalized anxiety disorder) Yes    Collaboration of Care: Psychiatrist AEB provider documentation available in EHR.  Patient/Guardian was advised  Release of Information must be obtained prior to any record release in order to collaborate their care with an outside provider. Patient/Guardian was advised if they have not already done so to contact the registration department to sign all necessary forms in order for us  to release information regarding their care.   Consent: Patient/Guardian gives verbal consent for treatment and assignment of benefits for services provided during this visit. Patient/Guardian expressed understanding and agreed to proceed.   Virtual Visit via Video Note  I connected with [name] on 07/10/24 at  4:00 PM EST by a video enabled telemedicine application and verified that I am speaking with the correct person using two identifiers.  Location: Patient: Home Provider: Home Office   I discussed the limitations of evaluation and management by telemedicine and the availability of in person appointments. The patient expressed understanding and agreed to proceed.  I discussed the assessment and treatment plan with the patient. The patient was provided an opportunity to ask questions and all were answered. The patient agreed with the plan and demonstrated an understanding of the instructions.   The patient was advised to call back or seek an in-person evaluation if the symptoms worsen or if the condition fails to improve as anticipated.  I provided *** minutes of non-face-to-face time during this encounter.   Lynwood JONETTA Maris, MSW, LCSW 07/10/2024,  4:12 PM

## 2024-07-24 ENCOUNTER — Telehealth: Admitting: Family Medicine

## 2024-07-24 ENCOUNTER — Telehealth: Payer: Self-pay

## 2024-07-24 NOTE — Telephone Encounter (Signed)
 Patient was scheduled for a virtual visit for cold symptoms.   Patient is in [place] for the holidays.  Per provider, we are unallowed to prescribe beyond state lines.  Called patient and discussed.   She denies any fevers, shortness of breath or nausea/vomiting.   Advised on conservative measures and hydration. Discussed warm fluids with honey and OTC cold/flu relief.   Patient was appreciative.

## 2024-07-30 ENCOUNTER — Telehealth: Admitting: Physician Assistant

## 2024-07-30 DIAGNOSIS — B9689 Other specified bacterial agents as the cause of diseases classified elsewhere: Secondary | ICD-10-CM | POA: Diagnosis not present

## 2024-07-30 DIAGNOSIS — J019 Acute sinusitis, unspecified: Secondary | ICD-10-CM | POA: Diagnosis not present

## 2024-07-30 MED ORDER — DOXYCYCLINE HYCLATE 100 MG PO TABS: 100.0000 mg | ORAL_TABLET | Freq: Two times a day (BID) | ORAL | 0 refills | Status: AC

## 2024-07-30 NOTE — Progress Notes (Signed)
 E-Visit for Sinus Problems  We are sorry that you are not feeling well.  Here is how we plan to help!  Based on what you have shared with me it looks like you have sinusitis.  Sinusitis is inflammation and infection in the sinus cavities of the head.  Based on your presentation I believe you most likely have Acute Bacterial Sinusitis.  This is an infection caused by bacteria and is treated with antibiotics. I have prescribed Doxycycline 100mg  by mouth twice a day for 7 days. You may use an oral decongestant such as Mucinex D or if you have glaucoma or high blood pressure use plain Mucinex. Saline nasal spray help and can safely be used as often as needed for congestion.  If you develop worsening sinus pain, fever or notice severe headache and vision changes, or if symptoms are not better after completion of antibiotic, please schedule an appointment with a health care provider.    Sinus infections are not as easily transmitted as other respiratory infection, however we still recommend that you avoid close contact with loved ones, especially the very young and elderly.  Remember to wash your hands thoroughly throughout the day as this is the number one way to prevent the spread of infection!  Home Care: Only take medications as instructed by your medical team. Complete the entire course of an antibiotic. Do not take these medications with alcohol. A steam or ultrasonic humidifier can help congestion.  You can place a towel over your head and breathe in the steam from hot water coming from a faucet. Avoid close contacts especially the very young and the elderly. Cover your mouth when you cough or sneeze. Always remember to wash your hands.  Get Help Right Away If: You develop worsening fever or sinus pain. You develop a severe head ache or visual changes. Your symptoms persist after you have completed your treatment plan.  Make sure you Understand these instructions. Will watch your  condition. Will get help right away if you are not doing well or get worse.  Your e-visit answers were reviewed by a board certified advanced clinical practitioner to complete your personal care plan.  Depending on the condition, your plan could have included both over the counter or prescription medications.  If there is a problem please reply  once you have received a response from your provider.  Your safety is important to us .  If you have drug allergies check your prescription carefully.    You can use MyChart to ask questions about today's visit, request a non-urgent call back, or ask for a work or school excuse for 24 hours related to this e-Visit. If it has been greater than 24 hours you will need to follow up with your provider, or enter a new e-Visit to address those concerns.  You will get an e-mail in the next two days asking about your experience.  I hope that your e-visit has been valuable and will speed your recovery. Thank you for using e-visits.  I have spent 5 minutes in review of e-visit questionnaire, review and updating patient chart, medical decision making and response to patient.   Delon CHRISTELLA Dickinson, PA-C

## 2024-07-31 ENCOUNTER — Ambulatory Visit (HOSPITAL_COMMUNITY): Admitting: Licensed Clinical Social Worker

## 2024-07-31 ENCOUNTER — Encounter (HOSPITAL_COMMUNITY): Payer: Self-pay

## 2024-08-01 ENCOUNTER — Ambulatory Visit (HOSPITAL_COMMUNITY): Admitting: Licensed Clinical Social Worker

## 2024-08-13 ENCOUNTER — Ambulatory Visit (INDEPENDENT_AMBULATORY_CARE_PROVIDER_SITE_OTHER): Admitting: Licensed Clinical Social Worker

## 2024-08-13 DIAGNOSIS — F411 Generalized anxiety disorder: Secondary | ICD-10-CM

## 2024-08-13 DIAGNOSIS — F331 Major depressive disorder, recurrent, moderate: Secondary | ICD-10-CM | POA: Diagnosis not present

## 2024-08-13 NOTE — Progress Notes (Signed)
 THERAPIST PROGRESS NOTE   Session Date: 08/13/2024  Session Time: 1114 - 1156  Participation Level: Active  Behavioral Response: CasualAlertAnxious, Euthymic, and Irritable  Type of Therapy: Individual Therapy  Treatment Goals addressed:   Progressing (4) LTG: Demyah will reduce the amount of anger-related incidents/outbursts by 50% as evidenced by self-report (Anger Management) LTG: Reduce frequency, intensity, and duration of depression symptoms so that daily functioning is improved (OP Depression) LTG: Increase coping skills to manage depression and improve ability to perform daily activities (OP Depression) LTG: Try to improve management of moods, and how I project them (OP Depression)  Completed/Met (1) STG: Irvin will identify situations, thoughts, and feelings that trigger internal anger, and/or angry/aggressive actions as evidenced by self-report (Anger Management)  Progress Towards Goals: Progressing  Interventions: CBT, Motivational Interviewing, Solution Focused, and Supportive  Summary: Sabrina Brennan is a 34 y.o. female with past psych history of GAD and MDD, presenting for follow-up therapy session in efforts to improve management of presenting depressive and anxious symptoms.  Patient actively engaged in session, presenting with a generally pleasant mood and congruent affect, though initially appearing mildly irritable. 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 Pt processed personal stress tied to interactions with several specific students.  Pt provided an extensive recount of events over the past month, including travel to visit grandparents during Christmas break. Pt noted becoming ill with the flu while traveling, which added stress and complicated holiday plans. Pt described spending  the past two weeks with boyfriend and close friends, including a recent [place] trip [place].  Pt reflected on previously identified concerns within her romantic relationship, acknowledging unhealthy coping patterns and communication difficulties observed in both herself and her boyfriend. Pt processed how they navigated conflict last month, highlighting improved communication, willingness to compromise, and more intentional emotional engagement. Pt reported feeling closer to boyfriend over the past month, noting that strengthened connections with each other's families have contributed to a greater sense of stability and support in the relationship.     03/15/2024   10:40 AM 02/21/2024   11:12 AM 01/31/2024    1:10 PM 01/19/2024    4:10 PM  GAD 7 : Generalized Anxiety Score  Nervous, Anxious, on Edge 2 1 1  0  Control/stop worrying 1 0 0 2  Worry too much - different things 0 0 0 2  Trouble relaxing 1 0 1 2  Restless 0 0 1 0  Easily annoyed or irritable 2 1 2 3   Afraid - awful might happen 0 0 0 1  Total GAD 7 Score 6 2 5 10   Anxiety Difficulty Very difficult Somewhat difficult Somewhat difficult Somewhat difficult      03/15/2024   10:42 AM 02/21/2024   11:14 AM 01/31/2024    1:12 PM 01/19/2024    4:13 PM 12/19/2023    4:29 PM  Depression screen PHQ 2/9  Decreased Interest 1 0 1 1 1   Down, Depressed, Hopeless 1 0 1 0 1  PHQ - 2 Score 2 0 2 1 2   Altered sleeping 1 1 0 2 0  Tired, decreased energy 0 1 0 1 2  Change in appetite 0 1 2 2 2   Feeling bad or failure about yourself  0 0 0 0 0  Trouble concentrating 1 0 1 2 1   Moving slowly or fidgety/restless 0 0 0 0 0  Suicidal thoughts 0 0 0 0 0  PHQ-9 Score 4  3  5  8  7    Difficult doing work/chores Somewhat difficult Somewhat difficult Somewhat difficult Very difficult Somewhat difficult     Data saved with a previous flowsheet row definition    Suicidal/Homicidal: Nowithout intent/plan  Therapist Response:  Clinician  openly greeted patient upon joining today's visit, assessing presenting mood and affect, engaging in introductory check-in. Utilized open ended questions in eliciting pt's recounts of events of the past month, utilizing active listening techniques to support pt's recounts of event. Utilized CBT to explore cognitive and emotional responses to work stressors and relational triggers; supported identification of effective communication strategies; applied MI to reinforce insight into healthy versus maladaptive coping and to encourage continued relational skill-building; provided supportive reflection and validation of stress related to illness and workload. Clinician reassessed severity of presenting sxs, and presence of any safety concerns. Pt was open, reflective, and receptive to interventions. Demonstrated insight into relational dynamics and workplace stress. Patient continues to meet criteria for GAD and MDD. Continued outpatient therapy remains appropriate.  [x]  Cognitive Challenging []  Cognitive Refocusing [x]  Cognitive Reframing  [x]  Communication Skills []  Compliance Issues []  DBT [x]  Exploration of Coping Patterns [x]  Exploration of Emotions [x]  Exploration of Relationship Patterns []  Guided Imagery []  Interactive Feedback []  Interpersonal Resolutions []  Mindfulness Training []  Preventative Services [x]  Psycho-Education  []  Relaxation/Deep Breathing []  Review of Treatment Plan/Progress []  Role-Play/Behavioral Rehearsal  [x]  Structured Problem Solving [x]  Supportive Reflection [x]  Symptom Management  []  Other   Homework: None.  Plan: Return again in 2 weeks.  Diagnosis:  Encounter Diagnoses  Name Primary?   GAD (generalized anxiety disorder) Yes   Moderate episode of recurrent major depressive disorder (HCC)     Collaboration of Care: Psychiatrist AEB provider documentation available in EHR.  Patient/Guardian was advised Release of Information must be obtained prior to any record  release in order to collaborate their care with an outside provider. Patient/Guardian was advised if they have not already done so to contact the registration department to sign all necessary forms in order for us  to release information regarding their care.   Consent: Patient/Guardian gives verbal consent for treatment and assignment of benefits for services provided during this visit. Patient/Guardian expressed understanding and agreed to proceed.   Virtual Visit via Video Note  I connected with Sabrina Brennan Public on 08/13/2024 at 11:00 AM EST by a video enabled telemedicine application and verified that I am speaking with the correct person using two identifiers.  Location: Patient: Home Provider: Home Office   I discussed the limitations of evaluation and management by telemedicine and the availability of in person appointments. The patient expressed understanding and agreed to proceed.  I discussed the assessment and treatment plan with the patient. The patient was provided an opportunity to ask questions and all were answered. The patient agreed with the plan and demonstrated an understanding of the instructions.   The patient was advised to call back or seek an in-person evaluation if the symptoms worsen or if the condition fails to improve as anticipated.  I provided 42 minutes of non-face-to-face time during this encounter.   Lynwood JONETTA Maris, MSW, LCSW 08/13/2024,  11:38 AM

## 2024-08-15 ENCOUNTER — Ambulatory Visit (HOSPITAL_COMMUNITY): Admitting: Licensed Clinical Social Worker

## 2024-08-18 ENCOUNTER — Telehealth: Admitting: Nurse Practitioner

## 2024-08-18 DIAGNOSIS — J069 Acute upper respiratory infection, unspecified: Secondary | ICD-10-CM

## 2024-08-18 MED ORDER — BENZONATATE 100 MG PO CAPS: 100.0000 mg | ORAL_CAPSULE | Freq: Three times a day (TID) | ORAL | 0 refills | Status: AC | PRN

## 2024-08-18 MED ORDER — IPRATROPIUM BROMIDE 0.03 % NA SOLN: 2.0000 | Freq: Two times a day (BID) | NASAL | 0 refills | Status: AC

## 2024-08-18 MED ORDER — PREDNISONE 20 MG PO TABS: 40.0000 mg | ORAL_TABLET | Freq: Every day | ORAL | 0 refills | Status: DC

## 2024-08-18 MED ORDER — PROMETHAZINE-DM 6.25-15 MG/5ML PO SYRP: 5.0000 mL | ORAL_SOLUTION | Freq: Three times a day (TID) | ORAL | 0 refills | Status: AC | PRN

## 2024-08-18 NOTE — Patient Instructions (Signed)
 " Sabrina Brennan, thank you for joining Haze LELON Servant, NP for today's virtual visit.  While this provider is not your primary care provider (PCP), if your PCP is located in our provider database this encounter information will be shared with them immediately following your visit.   A [facility] MyChart account gives you access to today's visit and all your visits, tests, and labs performed at [facility]  click here if you don't have a [facility] MyChart account or go to [email or web address]  Consent: (Patient) Sabrina Brennan provided verbal consent for this virtual visit at the beginning of the encounter.  Current Medications:  Current Outpatient Medications:    ipratropium (ATROVENT ) 0.03 % nasal spray, Place 2 sprays into both nostrils every 12 (twelve) hours., Disp: 30 mL, Rfl: 0   predniSONE  (DELTASONE ) 20 MG tablet, Take 2 tablets (40 mg total) by mouth daily with breakfast for 5 days., Disp: 10 tablet, Rfl: 0   albuterol  (VENTOLIN  HFA) 108 (90 Base) MCG/ACT inhaler, Inhale 1-2 puffs into the lungs every 6 (six) hours as needed. (Patient not taking: Reported on 11/14/2023), Disp: 8 g, Rfl: 0   albuterol  (VENTOLIN  HFA) 108 (90 Base) MCG/ACT inhaler, Inhale 2 puffs into the lungs every 6 (six) hours as needed for wheezing or shortness of breath., Disp: 8 g, Rfl: 0   amoxicillin -clavulanate (AUGMENTIN ) 875-125 MG tablet, Take 1 tablet by mouth 2 (two) times daily., Disp: 14 tablet, Rfl: 0   benzonatate  (TESSALON ) 100 MG capsule, Take 1-2 capsules (100-200 mg total) by mouth 3 (three) times daily as needed., Disp: 30 capsule, Rfl: 0   cetirizine  (ZYRTEC ) 10 MG tablet, Take 1 tablet (10 mg total) by mouth daily., Disp: 30 tablet, Rfl: 0   cyclobenzaprine  (FLEXERIL ) 5 MG tablet, Take 1 tablet (5 mg total) by mouth at bedtime as needed. (Patient not taking: Reported on 11/14/2023), Disp: 30 tablet, Rfl: 0   docusate sodium  (COLACE) 100 MG capsule, Take 1 capsule (100 mg total)  by mouth every 12 (twelve) hours. (Patient not taking: Reported on 11/14/2023), Disp: 60 capsule, Rfl: 0   doxycycline  (VIBRA -TABS) 100 MG tablet, Take 1 tablet (100 mg total) by mouth 2 (two) times daily., Disp: 14 tablet, Rfl: 0   escitalopram  (LEXAPRO ) 20 MG tablet, Take 1 tablet (20 mg total) by mouth daily., Disp: 30 tablet, Rfl: 2   fluconazole  (DIFLUCAN ) 150 MG tablet, Take 1 tablet (150 mg total) by mouth every three (3) days as needed., Disp: 2 tablet, Rfl: 0   fluticasone  (FLONASE ) 50 MCG/ACT nasal spray, Place 2 sprays into both nostrils daily., Disp: 16 g, Rfl: 0   HYDROcodone -acetaminophen  (NORCO/VICODIN) 5-325 MG tablet, Take 1 tablet by mouth every 4 (four) hours as needed for severe pain (pain score 7-10)., Disp: 5 tablet, Rfl: 0   hydrOXYzine  (ATARAX ) 10 MG tablet, Take 1 tablet (10 mg total) by mouth 3 (three) times daily as needed. May take 2 tablets ( 20mg  total) at night for sleep disturbance, Disp: 30 tablet, Rfl: 2   ibuprofen  (ADVIL ) 600 MG tablet, Take 1 tablet (600 mg total) by mouth every 8 (eight) hours as needed., Disp: 30 tablet, Rfl: 0   ketoconazole  (NIZORAL ) 2 % shampoo, Apply 1 Application topically 2 (two) times a week., Disp: 120 mL, Rfl: 0   ketoconazole  (NIZORAL ) 2 % shampoo, Apply 1 Application topically 2 (two) times a week., Disp: 120 mL, Rfl: 2   promethazine -dextromethorphan  (PROMETHAZINE -DM) 6.25-15 MG/5ML syrup, Take 5 mLs by mouth 3 (three)  times daily as needed for cough., Disp: 240 mL, Rfl: 0   propranolol  (INDERAL ) 10 MG tablet, Take 1 tablet (10 mg total) by mouth 3 (three) times daily., Disp: 10 tablet, Rfl: 0   scopolamine  (TRANSDERM-SCOP) 1 MG/3DAYS, Place 1 patch (1 mg total) onto the skin every 3 (three) days., Disp: 4 patch, Rfl: 0   triamcinolone  cream (KENALOG ) 0.1 %, Apply 1 Application topically 2 (two) times daily., Disp: 30 g, Rfl: 0   Medications ordered in this encounter:  Meds ordered this encounter  Medications   predniSONE   (DELTASONE ) 20 MG tablet    Sig: Take 2 tablets (40 mg total) by mouth daily with breakfast for 5 days.    Dispense:  10 tablet    Refill:  0    Supervising Provider:   LAMPTEY, PHILIP O [[id number]]   promethazine -dextromethorphan  (PROMETHAZINE -DM) 6.25-15 MG/5ML syrup    Sig: Take 5 mLs by mouth 3 (three) times daily as needed for cough.    Dispense:  240 mL    Refill:  0    Supervising Provider:   BLAISE ALEENE KIDD [[id number]]   ipratropium (ATROVENT ) 0.03 % nasal spray    Sig: Place 2 sprays into both nostrils every 12 (twelve) hours.    Dispense:  30 mL    Refill:  0    Supervising Provider:   LAMPTEY, PHILIP O [[id number]]   benzonatate  (TESSALON ) 100 MG capsule    Sig: Take 1-2 capsules (100-200 mg total) by mouth 3 (three) times daily as needed.    Dispense:  30 capsule    Refill:  0    Supervising Provider:   BLAISE ALEENE KIDD [[id number]]     *If you need refills on other medications prior to your next appointment, please contact your pharmacy*  Follow-Up: Call back or seek an in-person evaluation if the symptoms worsen or if the condition fails to improve as anticipated.  [facility] [phone]  Other Instructions    If you have been instructed to have an in-person evaluation today at a local Urgent Care facility, please use the link below. It will take you to a list of all of our available [facility], including address, phone number and hours of operation. Please do not delay care.  [facility]  If you or a family member do not have a primary care provider, use the link below to schedule a visit and establish care. When you choose a [facility] primary care physician or advanced practice provider, you gain a long-term partner in health. Find a Primary Care Provider  Learn more about [facility] in-office and virtual care options:  - Get Care Now  "

## 2024-08-18 NOTE — Progress Notes (Signed)
 " Virtual Visit Consent   Sabrina Brennan, you are scheduled for a virtual visit with a [facility] provider today. Just as with appointments in the office, your consent must be obtained to participate. Your consent will be active for this visit and any virtual visit you may have with one of our providers in the next 365 days. If you have a MyChart account, a copy of this consent can be sent to you electronically.  As this is a virtual visit, video technology does not allow for your provider to perform a traditional examination. This may limit your provider's ability to fully assess your condition. If your provider identifies any concerns that need to be evaluated in person or the need to arrange testing (such as labs, EKG, etc.), we will make arrangements to do so. Although advances in technology are sophisticated, we cannot ensure that it will always work on either your end or our end. If the connection with a video visit is poor, the visit may have to be switched to a telephone visit. With either a video or telephone visit, we are not always able to ensure that we have a secure connection.  By engaging in this virtual visit, you consent to the provision of healthcare and authorize for your insurance to be billed (if applicable) for the services provided during this visit. Depending on your insurance coverage, you may receive a charge related to this service.  I need to obtain your verbal consent now. Are you willing to proceed with your visit today? Sabrina Brennan has provided verbal consent on 08/18/2024 for a virtual visit (video or telephone). Sabrina LELON Servant, NP  Date: 08/18/2024 3:27 PM   Virtual Visit via Video Note   I, Sabrina Brennan, [name]  ([id number], [birth year]) on 08/18/24 at  3:00 PM EST by a video-enabled telemedicine application and verified that I am speaking with the correct person using two identifiers.  Location: Patient: Virtual Visit Location  Patient: Home Provider: Virtual Visit Location Provider: Home Office   I discussed the limitations of evaluation and management by telemedicine and the availability of in person appointments. The patient expressed understanding and agreed to proceed.    History of Present Illness: Sabrina Brennan is a 34 y.o. who identifies as a female who was assigned female at birth, and is being seen today for viral uri with cough and congestion.   Sabrina Brennan was diagnosed with influenza last month. Then treated for bacterial sinusitis with abx Doxy for 7 days on 07-30-2024. Earlier this week she started experiencing increased mucous production, productive cough, nasal congestion. Current taking mucinex , theraflu and cold intensive flu medicine.    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  Resting comfortably at home.  Head is normocephalic, atraumatic.  No labored breathing.  Speech is clear and coherent with logical content.  Patient is alert and oriented at baseline.    Assessment and Plan: 1. Viral URI  with cough (Primary) - predniSONE  (DELTASONE ) 20 MG tablet; Take 2 tablets (40 mg total) by mouth daily with breakfast for 5 days.  Dispense: 10 tablet; Refill: 0 - promethazine -dextromethorphan  (PROMETHAZINE -DM) 6.25-15 MG/5ML syrup; Take 5 mLs by mouth 3 (three) times daily as needed for cough.  Dispense:  240 mL; Refill: 0 - ipratropium (ATROVENT ) 0.03 % nasal spray; Place 2 sprays into both nostrils every 12 (twelve) hours.  Dispense: 30 mL; Refill: 0 - benzonatate  (TESSALON ) 100 MG capsule; Take 1-2 capsules (100-200 mg total) by mouth 3 (three) times daily as needed.  Dispense: 30 capsule; Refill: 0    Follow Up Instructions: I discussed the assessment and treatment plan with the patient. The patient was provided an opportunity to ask questions and all were answered. The patient agreed with the plan and demonstrated an understanding of the instructions.  A copy of instructions were sent to the patient via MyChart unless otherwise noted below.     The patient was advised to call back or seek an in-person evaluation if the symptoms worsen or if the condition fails to improve as anticipated.    Sabrina LELON Servant, NP     [1] No Known Allergies [2]  Current Outpatient Medications:    ipratropium (ATROVENT ) 0.03 % nasal spray, Place 2 sprays into both nostrils every 12 (twelve) hours., Disp: 30 mL, Rfl: 0   predniSONE  (DELTASONE ) 20 MG tablet, Take 2 tablets (40 mg total) by mouth daily with breakfast for 5 days., Disp: 10 tablet, Rfl: 0   albuterol  (VENTOLIN  HFA) 108 (90 Base) MCG/ACT inhaler, Inhale 1-2 puffs into the lungs every 6 (six) hours as needed. (Patient not taking: Reported on 11/14/2023), Disp: 8 g, Rfl: 0   albuterol  (VENTOLIN  HFA) 108 (90 Base) MCG/ACT inhaler, Inhale 2 puffs into the lungs every 6 (six) hours as needed for wheezing or shortness of breath., Disp: 8 g, Rfl: 0   amoxicillin -clavulanate (AUGMENTIN ) 875-125 MG tablet, Take 1 tablet by mouth 2 (two) times daily., Disp: 14 tablet, Rfl: 0   benzonatate  (TESSALON ) 100 MG capsule, Take 1-2 capsules (100-200 mg total) by mouth 3 (three) times daily as needed., Disp: 30 capsule, Rfl: 0   cetirizine  (ZYRTEC ) 10 MG tablet, Take 1 tablet (10 mg total) by mouth daily., Disp: 30 tablet, Rfl: 0   cyclobenzaprine  (FLEXERIL ) 5 MG  tablet, Take 1 tablet (5 mg total) by mouth at bedtime as needed. (Patient not taking: Reported on 11/14/2023), Disp: 30 tablet, Rfl: 0   docusate sodium  (COLACE) 100 MG capsule, Take 1 capsule (100 mg total) by mouth every 12 (twelve) hours. (Patient not taking: Reported on 11/14/2023), Disp: 60 capsule, Rfl: 0   doxycycline  (VIBRA -TABS) 100 MG tablet, Take 1 tablet (100 mg total) by mouth 2 (two) times daily., Disp: 14 tablet, Rfl: 0   escitalopram  (LEXAPRO ) 20 MG tablet, Take 1 tablet (20 mg total) by mouth daily., Disp: 30 tablet, Rfl: 2   fluconazole  (DIFLUCAN ) 150 MG tablet, Take 1 tablet (150 mg total) by mouth every three (3) days as needed., Disp: 2 tablet, Rfl: 0   fluticasone  (FLONASE ) 50 MCG/ACT nasal spray, Place 2 sprays into both nostrils daily., Disp: 16 g, Rfl: 0   HYDROcodone -acetaminophen  (NORCO/VICODIN) 5-325 MG tablet, Take 1 tablet by mouth every 4 (four) hours as needed for severe pain (pain score 7-10)., Disp: 5 tablet, Rfl: 0   hydrOXYzine  (ATARAX ) 10 MG tablet, Take 1 tablet (10 mg total) by mouth 3 (three) times daily as needed. May take  2 tablets ( 20mg  total) at night for sleep disturbance, Disp: 30 tablet, Rfl: 2   ibuprofen  (ADVIL ) 600 MG tablet, Take 1 tablet (600 mg total) by mouth every 8 (eight) hours as needed., Disp: 30 tablet, Rfl: 0   ketoconazole  (NIZORAL ) 2 % shampoo, Apply 1 Application topically 2 (two) times a week., Disp: 120 mL, Rfl: 0   ketoconazole  (NIZORAL ) 2 % shampoo, Apply 1 Application topically 2 (two) times a week., Disp: 120 mL, Rfl: 2   promethazine -dextromethorphan  (PROMETHAZINE -DM) 6.25-15 MG/5ML syrup, Take 5 mLs by mouth 3 (three) times daily as needed for cough., Disp: 240 mL, Rfl: 0   propranolol  (INDERAL ) 10 MG tablet, Take 1 tablet (10 mg total) by mouth 3 (three) times daily., Disp: 10 tablet, Rfl: 0   scopolamine  (TRANSDERM-SCOP) 1 MG/3DAYS, Place 1 patch (1 mg total) onto the skin every 3 (three) days., Disp: 4 patch, Rfl: 0    triamcinolone  cream (KENALOG ) 0.1 %, Apply 1 Application topically 2 (two) times daily., Disp: 30 g, Rfl: 0  "

## 2024-08-21 ENCOUNTER — Other Ambulatory Visit: Payer: Self-pay | Admitting: Nurse Practitioner

## 2024-08-21 ENCOUNTER — Encounter: Payer: Self-pay | Admitting: Nurse Practitioner

## 2024-08-21 ENCOUNTER — Ambulatory Visit (HOSPITAL_COMMUNITY): Admitting: Licensed Clinical Social Worker

## 2024-08-21 DIAGNOSIS — F411 Generalized anxiety disorder: Secondary | ICD-10-CM

## 2024-08-21 DIAGNOSIS — F331 Major depressive disorder, recurrent, moderate: Secondary | ICD-10-CM | POA: Diagnosis not present

## 2024-08-21 DIAGNOSIS — J069 Acute upper respiratory infection, unspecified: Secondary | ICD-10-CM

## 2024-08-21 MED ORDER — PREDNISONE 20 MG PO TABS: 40.0000 mg | ORAL_TABLET | Freq: Every day | ORAL | 0 refills | Status: AC

## 2024-08-21 NOTE — Progress Notes (Unsigned)
 THERAPIST PROGRESS NOTE   Session Date: 08/21/2024  Session Time: 1115 - 1157  Participation Level: Active  Behavioral Response: CasualAlertAnxious, Euthymic, and Irritable  Type of Therapy: Individual Therapy  Treatment Goals addressed:   Progressing (4) LTG: Sabrina Brennan will reduce the amount of anger-related incidents/outbursts by 50% as evidenced by self-report (Anger Management) LTG: Reduce frequency, intensity, and duration of depression symptoms so that daily functioning is improved (OP Depression) LTG: Increase coping skills to manage depression and improve ability to perform daily activities (OP Depression) LTG: Try to improve management of moods, and how I project them (OP Depression)  Completed/Met (1) STG: Torah will identify situations, thoughts, and feelings that trigger internal anger, and/or angry/aggressive actions as evidenced by self-report (Anger Management)  Progress Towards Goals: Progressing  Interventions: CBT, Motivational Interviewing, Solution Focused, and Supportive  Summary: [name] is a 34 y.o. female with past psych history of GAD and MDD, presenting for follow-up therapy session in efforts to improve management of presenting depressive and anxious symptoms.  Patient actively engaged throughout session, presenting with an overall pleasant mood and congruent affect. During check-in, the patient discussed ongoing work-related stressors, primarily focused on student performance concerns and a continued lack of administrative support. 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 The absence of stable leadership has contributed to increased workplace strain.  The patient described recent challenges engaging with parents of a student who has completed minimal work. The patient noted having provided multiple opportunities to meet over  the past month, though parent schedules and inconsistent communication prevented follow-through. Patient reported that the parents arrived at the school this morning without confirmed scheduling, despite being informed earlier of available meeting times, resulting in another unsuccessful attempt to coordinate a conference. Patient expressed frustration that parent concern is only being voiced now despite limited prior engagement.  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 Patient expressed hope for snow over the coming weekend to allow for a break and time to decompress.  Additionally, the patient discussed recent illness and complications related to a prednisone  prescription. The patient inadvertently discarded the medication while cleaning their medicine cabinet and has been unable to obtain a refill due to the original prescription lacking refills. The patient is awaiting a response from their primary care provider regarding a rewritten prescription and processed frustration around the situation and the limitations of pharmacy dispensing protocols.     03/15/2024   10:40 AM 02/21/2024   11:12 AM 01/31/2024    1:10 PM 01/19/2024    4:10 PM  GAD 7 : Generalized Anxiety Score  Nervous, Anxious, on Edge 2  1  1   0   Control/stop worrying 1  0  0  2   Worry too much - different things 0  0  0  2   Trouble relaxing 1  0  1  2   Restless 0  0  1  0   Easily annoyed or irritable 2  1  2  3    Afraid - awful might happen 0  0  0  1   Total GAD 7 Score 6 2 5 10   Anxiety Difficulty Very difficult Somewhat difficult Somewhat difficult Somewhat difficult     Data saved with a previous flowsheet  row definition      03/15/2024   10:42 AM 02/21/2024   11:14 AM 01/31/2024    1:12 PM 01/19/2024    4:13 PM 12/19/2023     4:29 PM  Depression screen PHQ 2/9  Decreased Interest 1 0 1 1 1   Down, Depressed, Hopeless 1 0 1 0 1  PHQ - 2 Score 2 0 2 1 2   Altered sleeping 1 1 0 2 0  Tired, decreased energy 0 1 0 1 2  Change in appetite 0 1 2 2 2   Feeling bad or failure about yourself  0 0 0 0 0  Trouble concentrating 1 0 1 2 1   Moving slowly or fidgety/restless 0 0 0 0 0  Suicidal thoughts 0 0 0 0 0  PHQ-9 Score 4  3  5  8  7    Difficult doing work/chores Somewhat difficult Somewhat difficult Somewhat difficult Very difficult Somewhat difficult     Data saved with a previous flowsheet row definition    Suicidal/Homicidal: Nowithout intent/plan  Therapist Response:  Clinician openly greeted patient upon joining today's visit, assessing presenting mood and affect, engaging in introductory check-in. Utilized open ended questions in eliciting pt's recounts of events of the past month, utilizing active listening techniques to support pt's recounts of event. Provided supportive reflection and validation of workplace stressors. Utilized brief CBT strategies to reframe unhelpful thoughts and enhance problem-solving. Offered psychoeducation regarding communication boundaries and prescription refill processes. Clinician reassessed severity of presenting sxs, and presence of any safety concerns. Pt was open, reflective, and receptive to interventions. Demonstrated insight into relational dynamics and workplace stress. Patient continues to meet criteria for GAD and MDD. Continued outpatient therapy remains appropriate.  [x]  Cognitive Challenging []  Cognitive Refocusing [x]  Cognitive Reframing  [x]  Communication Skills []  Compliance Issues []  DBT []  Exploration of Coping Patterns [x]  Exploration of Emotions []  Exploration of Relationship Patterns []  Guided Imagery []  Interactive Feedback []  Interpersonal Resolutions []  Mindfulness Training []  Preventative Services [x]  Psycho-Education  []  Relaxation/Deep Breathing []  Review  of Treatment Plan/Progress []  Role-Play/Behavioral Rehearsal  [x]  Structured Problem Solving [x]  Supportive Reflection [x]  Symptom Management  []  Other   Homework: None.  Plan: Return again in 2 weeks.  Diagnosis:  Encounter Diagnoses  Name Primary?   Moderate episode of recurrent major depressive disorder (HCC) Yes   GAD (generalized anxiety disorder)     Collaboration of Care: Psychiatrist AEB provider documentation available in EHR.  Patient/Guardian was advised Release of Information must be obtained prior to any record release in order to collaborate their care with an outside provider. Patient/Guardian was advised if they have not already done so to contact the registration department to sign all necessary forms in order for us  to release information regarding their care.   Consent: Patient/Guardian gives verbal consent for treatment and assignment of benefits for services provided during this visit. Patient/Guardian expressed understanding and agreed to proceed.   Virtual Visit via Video Note  I connected with [name] on 08/21/24 at 11:00 AM EST by a video enabled telemedicine application and verified that I am speaking with the correct person using two identifiers.  Location: Patient: Work Provider: Economist   I discussed the limitations of evaluation and management by telemedicine and the availability of in person appointments. The patient expressed understanding and agreed to proceed.  I discussed the assessment and treatment plan with the patient. The patient was provided an opportunity to ask questions and all were answered. The patient  agreed with the plan and demonstrated an understanding of the instructions.   The patient was advised to call back or seek an in-person evaluation if the symptoms worsen or if the condition fails to improve as anticipated.  I provided 42 minutes of non-face-to-face time during this encounter.   Lynwood JONETTA Maris, MSW,  LCSW 08/21/2024,  11:16 AM

## 2024-08-24 ENCOUNTER — Telehealth

## 2024-08-24 DIAGNOSIS — J069 Acute upper respiratory infection, unspecified: Secondary | ICD-10-CM

## 2024-08-27 ENCOUNTER — Ambulatory Visit (HOSPITAL_COMMUNITY): Admitting: Licensed Clinical Social Worker

## 2024-08-27 DIAGNOSIS — F331 Major depressive disorder, recurrent, moderate: Secondary | ICD-10-CM

## 2024-08-27 DIAGNOSIS — F411 Generalized anxiety disorder: Secondary | ICD-10-CM

## 2024-08-29 ENCOUNTER — Ambulatory Visit

## 2024-08-30 ENCOUNTER — Ambulatory Visit (HOSPITAL_COMMUNITY)

## 2024-09-11 ENCOUNTER — Ambulatory Visit (HOSPITAL_COMMUNITY): Admitting: Licensed Clinical Social Worker

## 2024-09-27 ENCOUNTER — Ambulatory Visit (HOSPITAL_COMMUNITY): Admitting: Licensed Clinical Social Worker

## 2024-10-08 ENCOUNTER — Ambulatory Visit (HOSPITAL_COMMUNITY): Admitting: Licensed Clinical Social Worker
